# Patient Record
Sex: Female | Born: 1965 | Race: White | Hispanic: No | Smoking: Current every day smoker
Health system: Southern US, Community
[De-identification: ages and names within clinical notes are randomized; demographics above are authoritative.]

## PROBLEM LIST (undated history)

## (undated) DIAGNOSIS — K635 Polyp of colon: Secondary | ICD-10-CM

## (undated) DIAGNOSIS — A419 Sepsis, unspecified organism: Secondary | ICD-10-CM

## (undated) DIAGNOSIS — J1282 Pneumonia due to coronavirus disease 2019: Secondary | ICD-10-CM

## (undated) DIAGNOSIS — F32A Depression, unspecified: Secondary | ICD-10-CM

## (undated) DIAGNOSIS — M51369 Other intervertebral disc degeneration, lumbar region without mention of lumbar back pain or lower extremity pain: Secondary | ICD-10-CM

## (undated) DIAGNOSIS — R079 Chest pain, unspecified: Secondary | ICD-10-CM

## (undated) DIAGNOSIS — E785 Hyperlipidemia, unspecified: Secondary | ICD-10-CM

## (undated) DIAGNOSIS — R600 Localized edema: Secondary | ICD-10-CM

## (undated) DIAGNOSIS — J9601 Acute respiratory failure with hypoxia: Secondary | ICD-10-CM

## (undated) DIAGNOSIS — F419 Anxiety disorder, unspecified: Secondary | ICD-10-CM

## (undated) DIAGNOSIS — M4807 Spinal stenosis, lumbosacral region: Secondary | ICD-10-CM

## (undated) DIAGNOSIS — J449 Chronic obstructive pulmonary disease, unspecified: Secondary | ICD-10-CM

## (undated) DIAGNOSIS — E119 Type 2 diabetes mellitus without complications: Secondary | ICD-10-CM

## (undated) DIAGNOSIS — I5030 Unspecified diastolic (congestive) heart failure: Secondary | ICD-10-CM

## (undated) DIAGNOSIS — O039 Complete or unspecified spontaneous abortion without complication: Secondary | ICD-10-CM

## (undated) DIAGNOSIS — U071 COVID-19: Secondary | ICD-10-CM

## (undated) DIAGNOSIS — E559 Vitamin D deficiency, unspecified: Secondary | ICD-10-CM

## (undated) DIAGNOSIS — J45909 Unspecified asthma, uncomplicated: Secondary | ICD-10-CM

## (undated) DIAGNOSIS — M1990 Unspecified osteoarthritis, unspecified site: Secondary | ICD-10-CM

## (undated) DIAGNOSIS — I10 Essential (primary) hypertension: Secondary | ICD-10-CM

## (undated) DIAGNOSIS — K259 Gastric ulcer, unspecified as acute or chronic, without hemorrhage or perforation: Secondary | ICD-10-CM

## (undated) DIAGNOSIS — E538 Deficiency of other specified B group vitamins: Secondary | ICD-10-CM

## (undated) DIAGNOSIS — I509 Heart failure, unspecified: Secondary | ICD-10-CM

## (undated) DIAGNOSIS — K760 Fatty (change of) liver, not elsewhere classified: Secondary | ICD-10-CM

## (undated) HISTORY — DX: Complete or unspecified spontaneous abortion without complication: O03.9

## (undated) HISTORY — DX: Type 2 diabetes mellitus without complications: E11.9

## (undated) HISTORY — PX: TUBAL LIGATION: SHX77

---

## 2005-06-04 ENCOUNTER — Emergency Department: Payer: Self-pay | Admitting: Emergency Medicine

## 2005-10-24 ENCOUNTER — Emergency Department: Payer: Self-pay | Admitting: Emergency Medicine

## 2007-02-28 ENCOUNTER — Emergency Department: Payer: Self-pay | Admitting: Emergency Medicine

## 2007-03-25 ENCOUNTER — Emergency Department: Payer: Self-pay | Admitting: Emergency Medicine

## 2008-04-27 ENCOUNTER — Emergency Department: Payer: Self-pay | Admitting: Emergency Medicine

## 2008-07-14 ENCOUNTER — Emergency Department: Payer: Self-pay | Admitting: Internal Medicine

## 2008-11-03 ENCOUNTER — Emergency Department: Payer: Self-pay | Admitting: Emergency Medicine

## 2009-11-15 ENCOUNTER — Emergency Department: Payer: Self-pay | Admitting: Internal Medicine

## 2009-12-01 ENCOUNTER — Emergency Department: Payer: Self-pay | Admitting: Emergency Medicine

## 2010-05-09 ENCOUNTER — Emergency Department: Payer: Self-pay | Admitting: Emergency Medicine

## 2012-10-13 ENCOUNTER — Emergency Department: Payer: Self-pay | Admitting: Emergency Medicine

## 2015-08-24 ENCOUNTER — Emergency Department
Admission: EM | Admit: 2015-08-24 | Discharge: 2015-08-24 | Disposition: A | Discharge status: Home / Self Care | Payer: Self-pay | Attending: Emergency Medicine | Admitting: Emergency Medicine

## 2015-08-24 ENCOUNTER — Emergency Department: Payer: Self-pay

## 2015-08-24 DIAGNOSIS — W1839XA Other fall on same level, initial encounter: Secondary | ICD-10-CM | POA: Insufficient documentation

## 2015-08-24 DIAGNOSIS — S62617A Displaced fracture of proximal phalanx of left little finger, initial encounter for closed fracture: Secondary | ICD-10-CM | POA: Insufficient documentation

## 2015-08-24 DIAGNOSIS — Y9389 Activity, other specified: Secondary | ICD-10-CM | POA: Insufficient documentation

## 2015-08-24 DIAGNOSIS — S60052A Contusion of left little finger without damage to nail, initial encounter: Secondary | ICD-10-CM | POA: Insufficient documentation

## 2015-08-24 DIAGNOSIS — Y998 Other external cause status: Secondary | ICD-10-CM | POA: Insufficient documentation

## 2015-08-24 DIAGNOSIS — Y9289 Other specified places as the place of occurrence of the external cause: Secondary | ICD-10-CM | POA: Insufficient documentation

## 2015-08-24 DIAGNOSIS — S62607A Fracture of unspecified phalanx of left little finger, initial encounter for closed fracture: Secondary | ICD-10-CM

## 2015-08-24 MED ORDER — HYDROCODONE-ACETAMINOPHEN 5-325 MG PO TABS
1.0000 | ORAL_TABLET | Freq: Once | ORAL | Status: AC
  Administered 2015-08-24: 1 via ORAL
  Filled 2015-08-24: qty 1

## 2015-08-24 MED ORDER — HYDROCODONE-ACETAMINOPHEN 5-325 MG PO TABS: 1.0000 | ORAL_TABLET | ORAL | Status: DC | PRN

## 2015-08-24 NOTE — ED Provider Notes (Signed)
[facility] Emergency Department Provider Note  ____________________________________________  Time seen: Approximately 8:42 AM  I have reviewed the triage vital signs and the nursing notes.   HISTORY  Chief Complaint Finger Injury   HPI Jasmine Larsen is a 49 y.o. female is here with complaint of left fifth finger hurting after falling today. Patient denies any previous injury to her left hand. Patient did not take any medication prior to arrival. She denies any head injury or loss consciousness during this incident. Currently she reports her pain is 10 out of 10. Pain is increased with range of motion. Nothing is helping her pain.   No past medical history on file.  There are no active problems to display for this patient.   No past surgical history on file.  Current Outpatient Rx  Name  Route  Sig  Dispense  Refill  . HYDROcodone-acetaminophen (NORCO/VICODIN) 5-325 MG tablet   Oral   Take 1 tablet by mouth every 4 (four) hours as needed for moderate pain.   20 tablet   0     Allergies Review of patient's allergies indicates no known allergies.  No family history on file.  Social History Social History  Substance Use Topics  . Smoking status: Not on file  . Smokeless tobacco: Not on file  . Alcohol Use: Not on file    Review of Systems Constitutional: No fever/chills Eyes: No visual changes. ENT: No sore throat. Cardiovascular: Denies chest pain. Respiratory: Denies shortness of breath. Gastrointestinal:  No nausea, no vomiting.  Musculoskeletal: Negative for back pain. Positive left fifth finger pain. Neurological: Negative for headaches, focal weakness or numbness.  10-point ROS otherwise negative.  ____________________________________________   PHYSICAL EXAM:  VITAL SIGNS: ED Triage Vitals  Enc Vitals Group     BP 08/24/15 0838 144/79 mmHg     Pulse Rate 08/24/15 0838 98     Resp 08/24/15 0838 16     Temp 08/24/15  0838 98 F (36.7 C)     Temp Source 08/24/15 0838 Oral     SpO2 08/24/15 0838 97 %     Weight 08/24/15 0838 272 lb (123.378 kg)     Height 08/24/15 0838 5\' 4"  (1.626 m)     Head Cir --      Peak Flow --      Pain Score 08/24/15 0835 10     Pain Loc --      Pain Edu? --      Excl. in GC? --     Constitutional: Alert and oriented. Well appearing and in no acute distress. Eyes: Conjunctivae are normal. PERRL. EOMI. Head: Atraumatic. Nose: No congestion/rhinnorhea. Neck: No stridor.   Cardiovascular: Normal rate, regular rhythm. Grossly normal heart sounds.  Good peripheral circulation. Respiratory: Normal respiratory effort.  No retractions. Lungs CTAB. Gastrointestinal: Soft and nontender. No distention. No abdominal bruits. No CVA tenderness. Musculoskeletal: Left hand fifth finger moderate edema and tenderness at the base of the fifth digit. Range of motion is restricted secondary to pain. No lower extremity tenderness nor edema.  No joint effusions. Neurologic:  Normal speech and language. No gross focal neurologic deficits are appreciated. No gait instability. Motor sensory function intact. Skin:  Skin is warm, dry and intact. No rash noted. No abrasion. There is some ecchymosis noted on the volar aspect of the left hand at the base of the fifth finger. Psychiatric: Mood and affect are normal. Speech and behavior are normal.  ____________________________________________   LABS (  all labs ordered are listed, but only abnormal results are displayed)  Labs Reviewed - No data to display  RADIOLOGY  Left hand there is a fracture of the proximal aspect of the fifth phalanx with impaction and dorsal medial angulation distally per radiologist. ____________________________________________   PROCEDURES  Procedure(s) performed: None  Critical Care performed: No  ____________________________________________   INITIAL IMPRESSION / ASSESSMENT AND PLAN / ED COURSE  Pertinent labs  & imaging results that were available during my care of the patient were reviewed by me and considered in my medical decision making (see chart for details).  Patient was given Norco while in the emergency room. She is also given a prescription for the same. She is placed in a metal finger splint and also buddy taped to her fourth digit. She is to follow-up with Dr. Rosita Kea. She is also given a note for work for limited use of her left hand. ____________________________________________   FINAL CLINICAL IMPRESSION(S) / ED DIAGNOSES  Final diagnoses:  Closed fracture of phalanx of left little finger, initial encounter      Tommi Rumps, PA-C 08/24/15 1337  Emily Filbert, MD 08/24/15 [phone]

## 2015-08-24 NOTE — Discharge Instructions (Signed)
Finger Fracture Finger fractures are breaks in the bones of the fingers. There are many types of fractures. There are also different ways of treating these fractures. Your doctor will talk with you about the best way to treat your fracture. Injury is the main cause of broken fingers. This includes:  Injuries while playing sports.  Workplace injuries.  Falls. HOME CARE  Follow your doctor's instructions for:  Activities.  Exercises.  Physical therapy.  Take medicines only as told by your doctor for pain, discomfort, or fever. GET HELP IF: You have pain or swelling that limits:  The motion of your fingers.  The use of your fingers. GET HELP RIGHT AWAY IF:  You cannot feel your fingers, or your fingers become numb.   This information is not intended to replace advice given to you by your health care provider. Make sure you discuss any questions you have with your health care provider.   Document Released: 03/17/2008 Document Revised: 10/20/2014 Document Reviewed: 05/11/2013 Elsevier Interactive Patient Education 2016 [employer]    Ice and elevate as needed for swelling and pain. Wear splint for protection and support. Take Norco as needed for pain. Call orthopedist for appointment time.

## 2015-08-24 NOTE — ED Notes (Signed)
Pt injured left hand 5th finger this am.

## 2015-09-10 ENCOUNTER — Encounter: Payer: Self-pay | Admitting: Emergency Medicine

## 2015-09-10 ENCOUNTER — Emergency Department
Admission: EM | Admit: 2015-09-10 | Discharge: 2015-09-10 | Disposition: A | Discharge status: Home / Self Care | Payer: Self-pay | Attending: Emergency Medicine | Admitting: Emergency Medicine

## 2015-09-10 DIAGNOSIS — S62607A Fracture of unspecified phalanx of left little finger, initial encounter for closed fracture: Secondary | ICD-10-CM | POA: Insufficient documentation

## 2015-09-10 DIAGNOSIS — F17200 Nicotine dependence, unspecified, uncomplicated: Secondary | ICD-10-CM | POA: Insufficient documentation

## 2015-09-10 DIAGNOSIS — Y9289 Other specified places as the place of occurrence of the external cause: Secondary | ICD-10-CM | POA: Insufficient documentation

## 2015-09-10 DIAGNOSIS — W1839XA Other fall on same level, initial encounter: Secondary | ICD-10-CM | POA: Insufficient documentation

## 2015-09-10 DIAGNOSIS — Y998 Other external cause status: Secondary | ICD-10-CM | POA: Insufficient documentation

## 2015-09-10 DIAGNOSIS — S62609A Fracture of unspecified phalanx of unspecified finger, initial encounter for closed fracture: Secondary | ICD-10-CM

## 2015-09-10 DIAGNOSIS — Y9389 Activity, other specified: Secondary | ICD-10-CM | POA: Insufficient documentation

## 2015-09-10 HISTORY — DX: Unspecified asthma, uncomplicated: J45.909

## 2015-09-10 HISTORY — DX: Chronic obstructive pulmonary disease, unspecified: J44.9

## 2015-09-10 MED ORDER — OXYCODONE-ACETAMINOPHEN 5-325 MG PO TABS: 1.0000 | ORAL_TABLET | Freq: Four times a day (QID) | ORAL | Status: DC | PRN

## 2015-09-10 MED ORDER — MELOXICAM 15 MG PO TABS: 15.0000 mg | ORAL_TABLET | Freq: Every day | ORAL | Status: DC

## 2015-09-10 NOTE — Discharge Instructions (Signed)
Finger Fracture Fractures of fingers are breaks in the bones of the fingers. There are many types of fractures. There are different ways of treating these fractures. Your health care provider will discuss the best way to treat your fracture. CAUSES Traumatic injury is the main cause of broken fingers. These include:  Injuries while playing sports.  Workplace injuries.  Falls. RISK FACTORS Activities that can increase your risk of finger fractures include:  Sports.  Workplace activities that involve machinery.  A condition called osteoporosis, which can make your bones less dense and cause them to fracture more easily. SIGNS AND SYMPTOMS The main symptoms of a broken finger are pain and swelling within 15 minutes after the injury. Other symptoms include:  Bruising of your finger.  Stiffness of your finger.  Numbness of your finger.  Exposed bones (compound fracture) if the fracture is severe. DIAGNOSIS  The best way to diagnose a broken bone is with X-ray imaging. Additionally, your health care provider will use this X-ray image to evaluate the position of the broken finger bones.  TREATMENT  Finger fractures can be treated with:   Nonreduction--This means the bones are in place. The finger is splinted without changing the positions of the bone pieces. The splint is usually left on for about a week to 10 days. This will depend on your fracture and what your health care provider thinks.  Closed reduction--The bones are put back into position without using surgery. The finger is then splinted.  Open reduction and internal fixation--The fracture site is opened. Then the bone pieces are fixed into place with pins or some type of hardware. This is seldom required. It depends on the severity of the fracture. HOME CARE INSTRUCTIONS   Follow your health care provider's instructions regarding activities, exercises, and physical therapy.  Only take over-the-counter or prescription  medicines for pain, discomfort, or fever as directed by your health care provider. SEEK MEDICAL CARE IF: You have pain or swelling that limits the motion or use of your fingers. SEEK IMMEDIATE MEDICAL CARE IF:  Your finger becomes numb. MAKE SURE YOU:   Understand these instructions.  Will watch your condition.  Will get help right away if you are not doing well or get worse.   This information is not intended to replace advice given to you by your health care provider. Make sure you discuss any questions you have with your health care provider.   Document Released: 01/11/2001 Document Revised: 07/20/2013 Document Reviewed: 05/11/2013 Elsevier Interactive Patient Education 2016 [place] or Splint Care Casts and splints support injured limbs and keep bones from moving while they heal.  HOME CARE  Keep the cast or splint uncovered during the drying period.  A plaster cast can take 24 to 48 hours to dry.  A fiberglass cast will dry in less than 1 hour.  Do not rest the cast on anything harder than a pillow for 24 hours.  Do not put weight on your injured limb. Do not put pressure on the cast. Wait for your doctor's approval.  Keep the cast or splint dry.  Cover the cast or splint with a plastic bag during baths or wet weather.  If you have a cast over your chest and belly (trunk), take sponge baths until the cast is taken off.  If your cast gets wet, dry it with a towel or blow dryer. Use the cool setting on the blow dryer.  Keep your cast or splint clean. Wash a  dirty cast with a damp cloth. °· Do not put any objects under your cast or splint. °· Do not scratch the skin under the cast with an object. If itching is a problem, use a blow dryer on a cool setting over the itchy area. °· Do not trim or cut your cast. °· Do not take out the padding from inside your cast. °· Exercise your joints near the cast as told by your doctor. °· Raise (elevate) your injured limb on 1  or 2 pillows for the first 1 to 3 days. °GET HELP IF: °· Your cast or splint cracks. °· Your cast or splint is too tight or too loose. °· You itch badly under the cast. °· Your cast gets wet or has a soft spot. °· You have a bad smell coming from the cast. °· You get an object stuck under the cast. °· Your skin around the cast becomes red or sore. °· You have new or more pain after the cast is put on. °GET HELP RIGHT AWAY IF: °· You have fluid leaking through the cast. °· You cannot move your fingers or toes. °· Your fingers or toes turn blue or white or are cool, painful, or puffy (swollen). °· You have tingling or lose feeling (numbness) around the injured area. °· You have bad pain or pressure under the cast. °· You have trouble breathing or have shortness of breath. °· You have chest pain. °  °This information is not intended to replace advice given to you by your health care provider. Make sure you discuss any questions you have with your health care provider. °  °Document Released: 01/29/2011 Document Revised: 06/01/2013 Document Reviewed: 04/07/2013 °Elsevier Interactive Patient Education ©2016 [employer] Inc. ° °

## 2015-09-10 NOTE — ED Notes (Signed)
AAOx3.  Skin warm and dry.  NAD 

## 2015-09-10 NOTE — ED Notes (Signed)
States she fell and has a fx to left hand / finger states she is having increased pain

## 2015-09-10 NOTE — ED Provider Notes (Signed)
[facility] Emergency Department Provider Note  ____________________________________________  Time seen: Approximately 5:05 PM  I have reviewed the triage vital signs and the nursing notes.   HISTORY  Chief Complaint Hand Pain  HPI Jasmine Larsen is a 49 y.o. female who presents to the emergency department for evaluation of left fifth finger pain. She states that on the 11th she fell on outstretched hand that resulted in a fracture. She was evaluated here and placed in a splint. She then followed up with clinic orthopedics.She reports that the "PA snapped it and then put it in a cheap aluminum splint that is cutting everything." She reports that the pain is worse. She denies new injury.  Past Medical History  Diagnosis Date  . Asthma   . COPD (chronic obstructive pulmonary disease) (HCC)     There are no active problems to display for this patient.   History reviewed. No pertinent past surgical history.  Current Outpatient Rx  Name  Route  Sig  Dispense  Refill  . HYDROcodone-acetaminophen (NORCO/VICODIN) 5-325 MG tablet   Oral   Take 1 tablet by mouth every 4 (four) hours as needed for moderate pain.   20 tablet   0   . meloxicam (MOBIC) 15 MG tablet   Oral   Take 1 tablet (15 mg total) by mouth daily.   30 tablet   2   . oxyCODONE-acetaminophen (ROXICET) 5-325 MG tablet   Oral   Take 1 tablet by mouth every 6 (six) hours as needed.   12 tablet   0     Allergies Review of patient's allergies indicates no known allergies.  No family history on file.  Social History Social History  Substance Use Topics  . Smoking status: Current Every Day Smoker  . Smokeless tobacco: None  . Alcohol Use: No    Review of Systems Constitutional: No recent illness. Eyes: No visual changes. ENT: No sore throat. Cardiovascular: Denies chest pain or palpitations. Respiratory: Denies shortness of breath. Gastrointestinal: No abdominal pain.   Genitourinary: Negative for dysuria. Musculoskeletal: Pain in left 5th digit. Skin: Negative for rash. Neurological: Negative for headaches, focal weakness or numbness. 10-point ROS otherwise negative.  ____________________________________________   PHYSICAL EXAM:  VITAL SIGNS: ED Triage Vitals  Enc Vitals Group     BP 09/10/15 1628 132/84 mmHg     Pulse Rate 09/10/15 1628 95     Resp 09/10/15 1628 20     Temp 09/10/15 1628 98.6 F (37 C)     Temp Source 09/10/15 1628 Oral     SpO2 09/10/15 1628 94 %     Weight --      Height 09/10/15 1628  (1.575 m)     Head Cir --      Peak Flow --      Pain Score 09/10/15 1625 10     Pain Loc --      Pain Edu? --      Excl. in GC? --     Constitutional: Alert and oriented. Well appearing and in no acute distress. Eyes: Conjunctivae are normal. EOMI. Head: Atraumatic. Nose: No congestion/rhinnorhea. Neck: No stridor.  Respiratory: Normal respiratory effort.   Musculoskeletal: Deformity noted to the MCP of the 5th digit of the left hand. Limited range of motion due to pain.  Neurologic:  Normal speech and language. No gross focal neurologic deficits are appreciated. Speech is normal. No gait instability. Skin:  Skin is warm, dry and intact. Atraumatic.  Psychiatric: Mood and affect are normal. Speech and behavior are normal.  ____________________________________________   LABS (all labs ordered are listed, but only abnormal results are displayed)  Labs Reviewed - No data to display ____________________________________________  RADIOLOGY  Not indicated. ____________________________________________   PROCEDURES  Procedure(s) performed:   SPLINT APPLICATION Date/Time: 5:15 PM Authorized by: Kem Boroughsari Nathanyal Ashmead Consent: Verbal consent obtained. Risks and benefits: risks, benefits and alternatives were discussed Consent given by: patient Splint applied by: Mardene CelesteJoAnna, emergency department tech Location details: 5th digit  left hand Splint type: Modified ulnar gutter splint to enclose 4th and 5th digits and extends to mid forearm. Supplies used: OCL and ACE Post-procedure: The splinted body part was neurovascularly unchanged following the procedure. Patient tolerance: Patient tolerated the procedure well with no immediate complications. Patient reports decrease in pain after splinting.      ____________________________________________   INITIAL IMPRESSION / ASSESSMENT AND PLAN / ED COURSE  Pertinent labs & imaging results that were available during my care of the patient were reviewed by me and considered in my medical decision making (see chart for details).  Patient was advised to follow up with orthopedics in 2 weeks for repeat films. She was advised to leave the splint in place until follow up. She was advised to return to the emergency department for symptoms that change or worsen if unable to schedule an appointment with orthopedics.  ____________________________________________   FINAL CLINICAL IMPRESSION(S) / ED DIAGNOSES  Final diagnoses:  Fracture of finger, left, closed, initial encounter       Chinita PesterCari B Ashtin Melichar, FNP 09/10/15 1721  Loleta Roseory Forbach, MD 09/10/15 1814

## 2016-01-06 ENCOUNTER — Encounter: Payer: Self-pay | Admitting: Medical Oncology

## 2016-01-06 ENCOUNTER — Emergency Department: Payer: Self-pay

## 2016-01-06 ENCOUNTER — Emergency Department
Admission: EM | Admit: 2016-01-06 | Discharge: 2016-01-06 | Disposition: A | Discharge status: Home / Self Care | Payer: Self-pay | Attending: Emergency Medicine | Admitting: Emergency Medicine

## 2016-01-06 DIAGNOSIS — R197 Diarrhea, unspecified: Secondary | ICD-10-CM | POA: Insufficient documentation

## 2016-01-06 DIAGNOSIS — Z791 Long term (current) use of non-steroidal anti-inflammatories (NSAID): Secondary | ICD-10-CM | POA: Insufficient documentation

## 2016-01-06 DIAGNOSIS — K2970 Gastritis, unspecified, without bleeding: Secondary | ICD-10-CM | POA: Insufficient documentation

## 2016-01-06 DIAGNOSIS — N390 Urinary tract infection, site not specified: Secondary | ICD-10-CM | POA: Insufficient documentation

## 2016-01-06 DIAGNOSIS — R1013 Epigastric pain: Secondary | ICD-10-CM

## 2016-01-06 DIAGNOSIS — F17200 Nicotine dependence, unspecified, uncomplicated: Secondary | ICD-10-CM | POA: Insufficient documentation

## 2016-01-06 HISTORY — DX: Gastric ulcer, unspecified as acute or chronic, without hemorrhage or perforation: K25.9

## 2016-01-06 LAB — COMPREHENSIVE METABOLIC PANEL
ALBUMIN: 3.9 g/dL (ref 3.5–5.0)
ALT: 21 U/L (ref 14–54)
ANION GAP: 6 (ref 5–15)
AST: 14 U/L — ABNORMAL LOW (ref 15–41)
Alkaline Phosphatase: 95 U/L (ref 38–126)
BILIRUBIN TOTAL: 0.4 mg/dL (ref 0.3–1.2)
BUN: 15 mg/dL (ref 6–20)
CO2: 27 mmol/L (ref 22–32)
Calcium: 8.7 mg/dL — ABNORMAL LOW (ref 8.9–10.3)
Chloride: 102 mmol/L (ref 101–111)
Creatinine, Ser: 0.54 mg/dL (ref 0.44–1.00)
GFR calc non Af Amer: >60 mL/min (ref >60)
GLUCOSE: 97 mg/dL (ref 65–99)
POTASSIUM: 4.2 mmol/L (ref 3.5–5.1)
SODIUM: 135 mmol/L (ref 135–145)
TOTAL PROTEIN: 7.3 g/dL (ref 6.5–8.1)

## 2016-01-06 LAB — CBC
HEMATOCRIT: 43.1 % (ref 35.0–47.0)
HEMOGLOBIN: 14.3 g/dL (ref 12.0–16.0)
MCH: 28.9 pg (ref 26.0–34.0)
MCHC: 33.3 g/dL (ref 32.0–36.0)
MCV: 86.9 fL (ref 80.0–100.0)
Platelets: 219 10*3/uL (ref 150–440)
RBC: 4.96 MIL/uL (ref 3.80–5.20)
RDW: 14.1 % (ref 11.5–14.5)
WBC: 9.2 10*3/uL (ref 3.6–11.0)

## 2016-01-06 LAB — URINALYSIS COMPLETE WITH MICROSCOPIC (ARMC ONLY)
Bilirubin Urine: NEGATIVE
Glucose, UA: NEGATIVE mg/dL
HGB URINE DIPSTICK: NEGATIVE
KETONES UR: NEGATIVE mg/dL
NITRITE: NEGATIVE
PH: 8 (ref 5.0–8.0)
PROTEIN: NEGATIVE mg/dL
SPECIFIC GRAVITY, URINE: 1.014 (ref 1.005–1.030)

## 2016-01-06 LAB — LIPASE, BLOOD: Lipase: 19 U/L (ref 11–51)

## 2016-01-06 IMAGING — US US ABDOMEN LIMITED
1 series · 14 of 25 positions shown · non-contrast
Comparison: None.

CLINICAL DATA: Epigastric pain for 5 days.

EXAM:
US ABDOMEN LIMITED - RIGHT UPPER QUADRANT

[Series 1: us abdomen limited · 0.28mm/px · 14 of 49 slices shown]
[im 1/49]
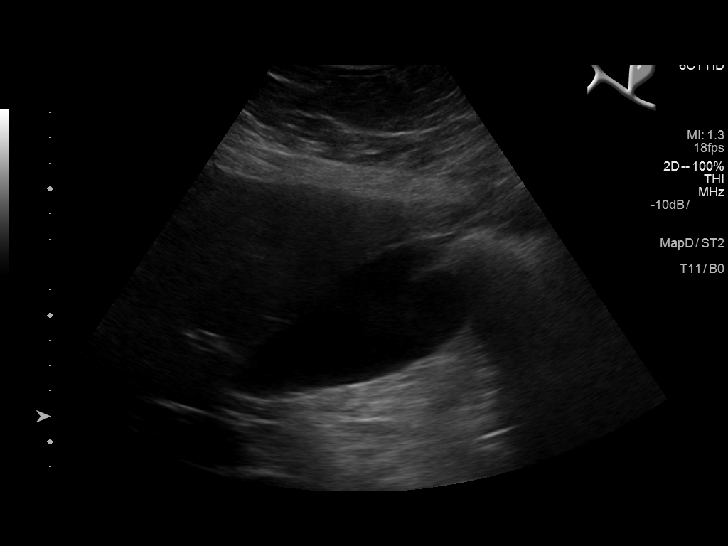
[im 5/49]
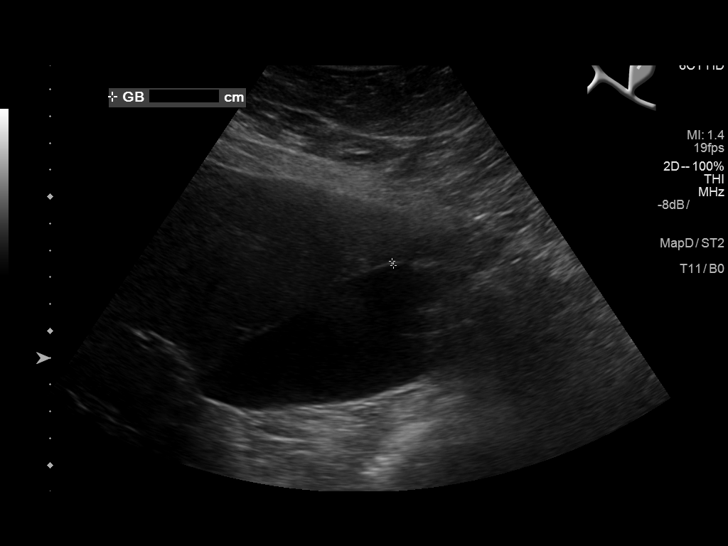
[im 9/49]
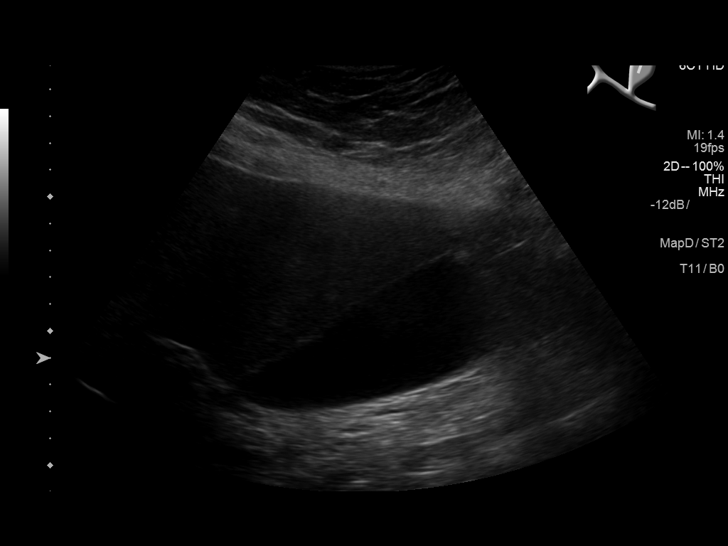
[im 13/49]
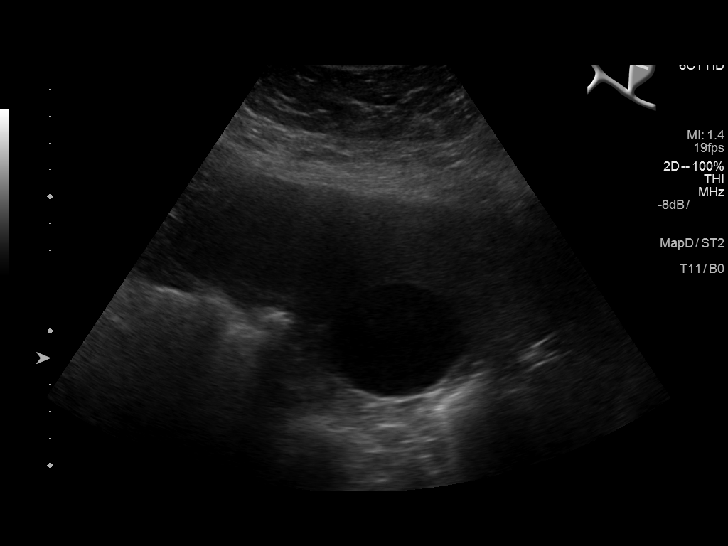
[im 17/49]
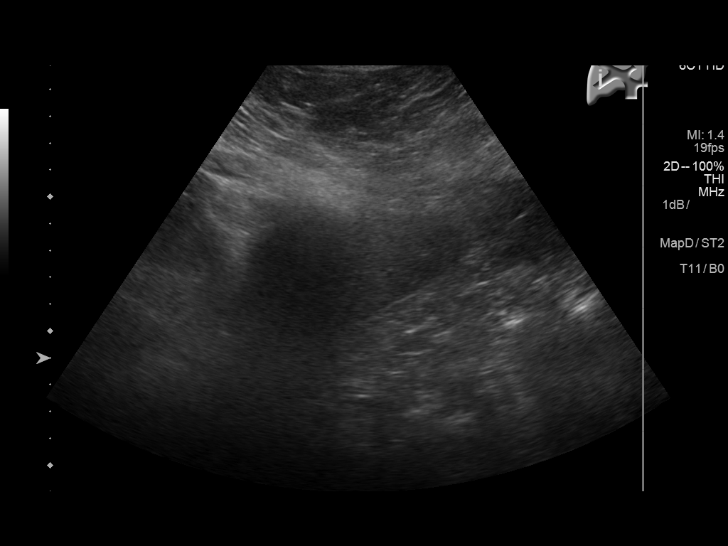
[im 19/49]
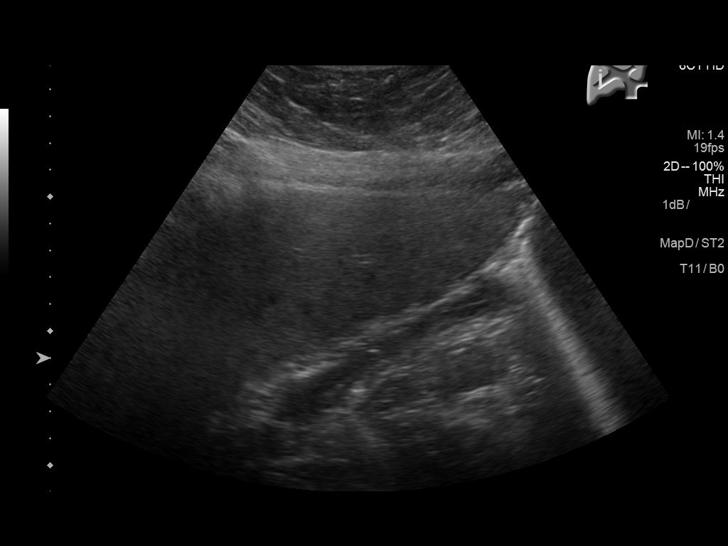
[im 23/49]
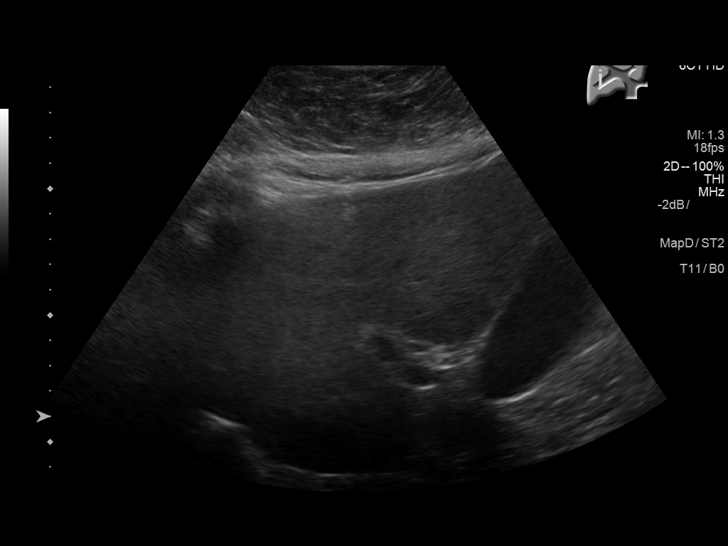
[im 27/49]
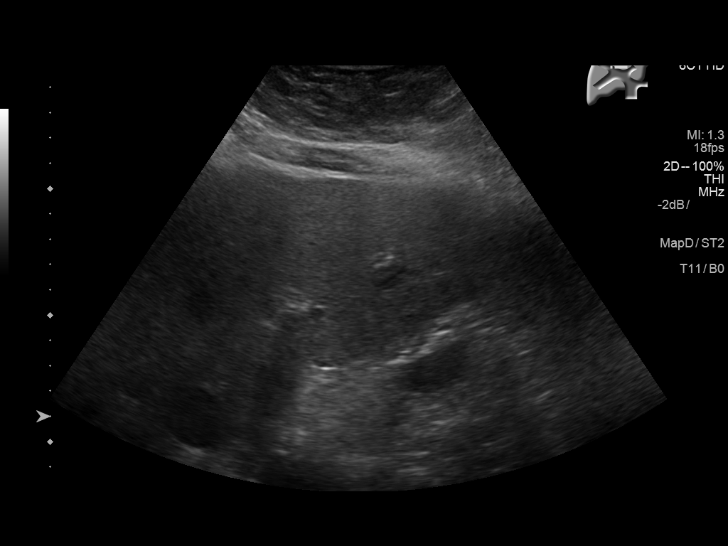
[im 31/49]
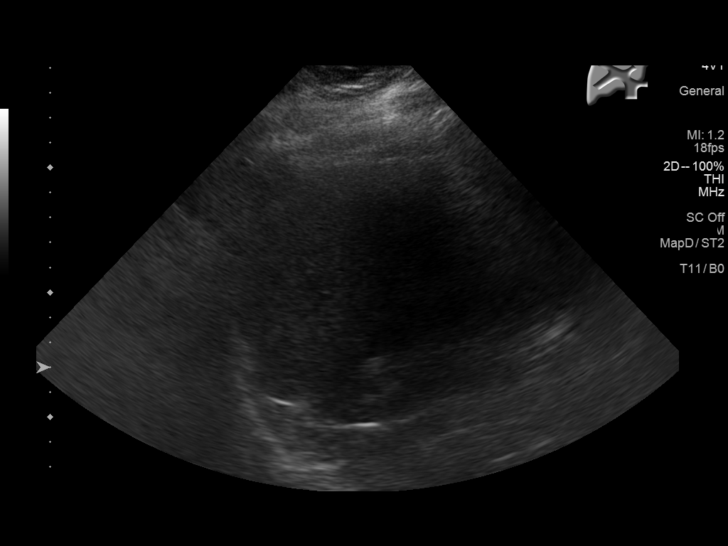
[im 33/49]
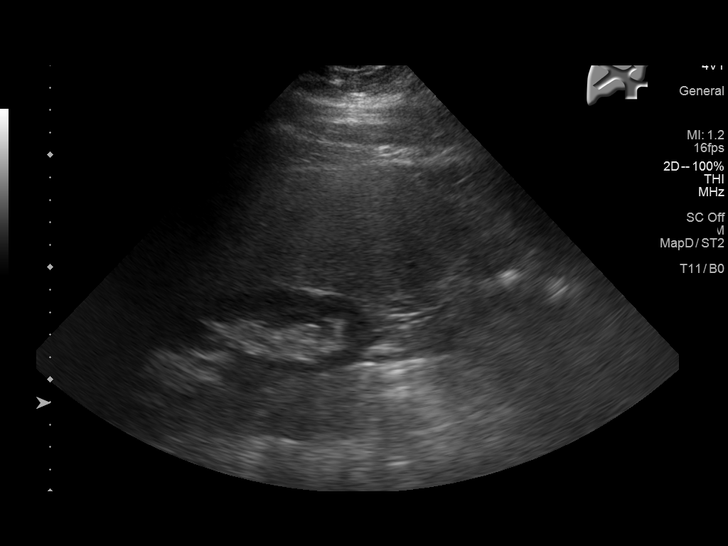
[im 37/49]
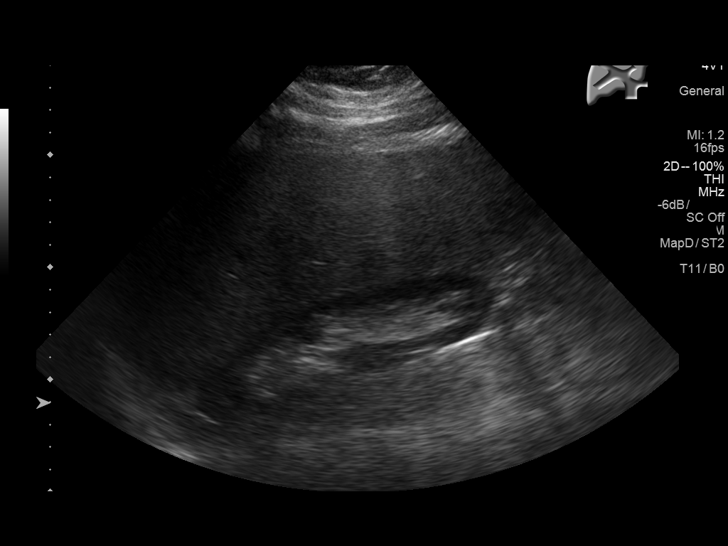
[im 41/49]
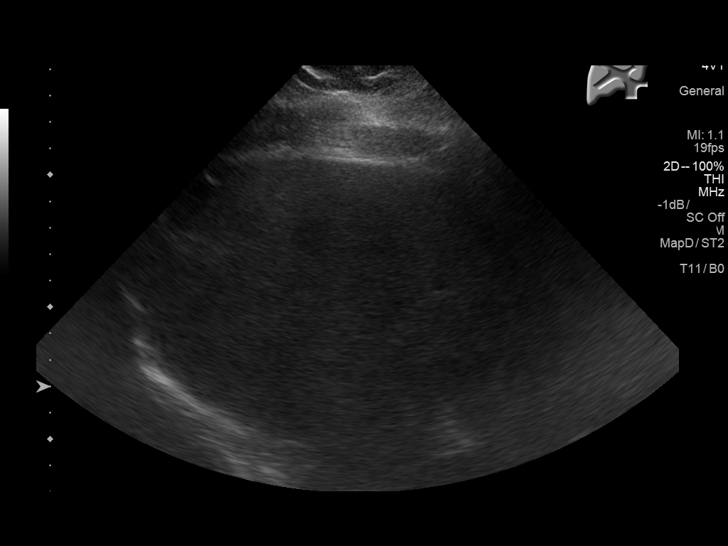
[im 45/49]
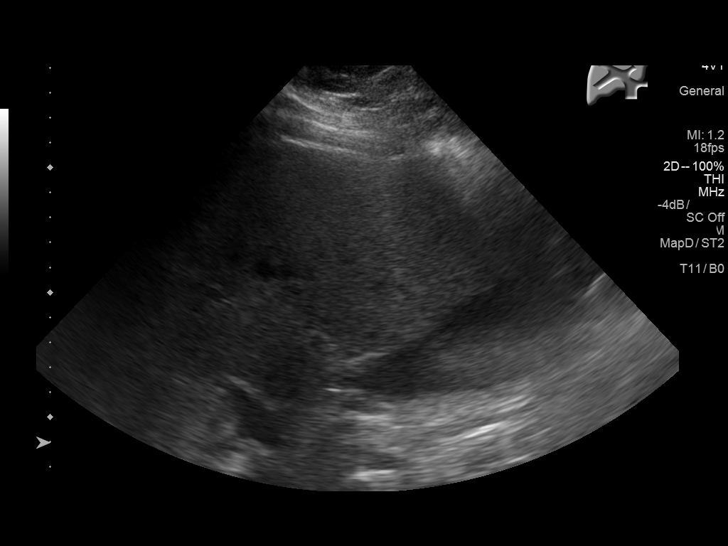
[im 49/49]
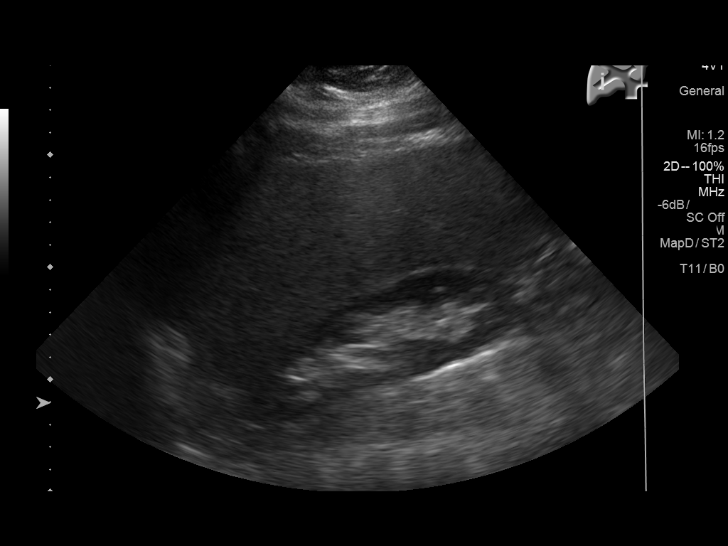

[14 of 25 positions shown; findings below may reference images not displayed]

FINDINGS: Gallbladder:

No gallstones or wall thickening visualized. No sonographic Murphy
sign noted by sonographer.

Common bile duct:

Diameter: 7.3 mm. Duct dilates to 13 mm more distally. No duct stone
is visualized.

Liver:

Echogenic with poor through transmission of the sound beam. No liver
mass or focal lesion.
IMPRESSION: 1. Normal gallbladder.
2. Dilated bile duct. Although this may be chronic, consider duct
obstruction if there are consistent clinical symptoms. This could be
further assessed with ERCP or MRCP.
3. Echogenic liver.  Findings suggests hepatic steatosis.

## 2016-01-06 MED ORDER — CIPROFLOXACIN HCL 500 MG PO TABS: 500.0000 mg | ORAL_TABLET | Freq: Two times a day (BID) | ORAL | Status: AC

## 2016-01-06 MED ORDER — ONDANSETRON HCL 4 MG PO TABS
4.0000 mg | ORAL_TABLET | Freq: Once | ORAL | Status: AC
  Administered 2016-01-06: 4 mg via ORAL
  Filled 2016-01-06: qty 1

## 2016-01-06 MED ORDER — TRAMADOL HCL 50 MG PO TABS: 50.0000 mg | ORAL_TABLET | Freq: Four times a day (QID) | ORAL | Status: DC | PRN

## 2016-01-06 MED ORDER — GI COCKTAIL ~~LOC~~
30.0000 mL | Freq: Once | ORAL | Status: AC
  Administered 2016-01-06: 30 mL via ORAL
  Filled 2016-01-06: qty 30

## 2016-01-06 MED ORDER — PANTOPRAZOLE SODIUM 20 MG PO TBEC: 20.0000 mg | DELAYED_RELEASE_TABLET | Freq: Every day | ORAL | Status: DC

## 2016-01-06 MED ORDER — ONDANSETRON HCL 4 MG PO TABS: 4.0000 mg | ORAL_TABLET | Freq: Three times a day (TID) | ORAL | Status: DC | PRN

## 2016-01-06 MED ORDER — PANTOPRAZOLE SODIUM 40 MG PO TBEC
40.0000 mg | DELAYED_RELEASE_TABLET | Freq: Once | ORAL | Status: AC
  Administered 2016-01-06: 40 mg via ORAL
  Filled 2016-01-06: qty 1

## 2016-01-06 NOTE — ED Notes (Signed)
Lower abd pain with diarrhea and vomiting that began Thursday. Pt reports her emesis is coffee ground.

## 2016-01-06 NOTE — ED Notes (Signed)
MD at bedside at this time.

## 2016-01-06 NOTE — ED Provider Notes (Signed)
Time Seen: Approximately 1700  I have reviewed the triage notes  Chief Complaint: Abdominal Pain; Emesis; and Diarrhea   History of Present Illness: Jasmine Larsen is a 50 y.o. female who presents with to this historian. Umbilical abdominal pain with nausea vomiting and diarrhea that started on Thursday. Patient states the pain started first and she's been taking some over-the-counter Pepto-Bismol for her discomfort. She states she was concerned could she vomited dark emesis one time and also had some dark stool. She is not aware of any fever and states the nausea and vomiting and the diarrhea has now slowed but she still has some persistent abdominal pain. Denies any lateralization of the pain or radiation to the back or flank area. She denies any dysuria, hematuria or urinary frequencies but she is prone to urinary tract infections.   Past Medical History  Diagnosis Date  . Asthma   . COPD (chronic obstructive pulmonary disease) (HCC)   . Multiple gastric ulcers     There are no active problems to display for this patient.   History reviewed. No pertinent past surgical history.  History reviewed. No pertinent past surgical history.  Current Outpatient Rx  Name  Route  Sig  Dispense  Refill  . HYDROcodone-acetaminophen (NORCO/VICODIN) 5-325 MG tablet   Oral   Take 1 tablet by mouth every 4 (four) hours as needed for moderate pain.   20 tablet   0   . meloxicam (MOBIC) 15 MG tablet   Oral   Take 1 tablet (15 mg total) by mouth daily.   30 tablet   2   . oxyCODONE-acetaminophen (ROXICET) 5-325 MG tablet   Oral   Take 1 tablet by mouth every 6 (six) hours as needed.   12 tablet   0     Allergies:  Review of patient's allergies indicates no known allergies.  Family History: No family history on file.  Social History: Social History  Substance Use Topics  . Smoking status: Current Every Day Smoker  . Smokeless tobacco: None  . Alcohol Use: No     Review  of Systems:   10 point review of systems was performed and was otherwise negative:  Constitutional: No fever Eyes: No visual disturbances ENT: No sore throat, ear pain Cardiac: No chest pain Respiratory: No shortness of breath, wheezing, or stridor Abdomen: Patient points mainly to the umbilicus region. Endocrine: No weight loss, No night sweats Extremities: No peripheral edema, cyanosis Skin: No rashes, easy bruising Neurologic: No focal weakness, trouble with speech or swollowing Urologic: No dysuria, Hematuria, or urinary frequency   Physical Exam:  ED Triage Vitals  Enc Vitals Group     BP 01/06/16 1513 140/83 mmHg     Pulse Rate 01/06/16 1513 91     Resp 01/06/16 1513 18     Temp 01/06/16 1513 98.2 F (36.8 C)     Temp Source 01/06/16 1513 Oral     SpO2 01/06/16 1513 96 %     Weight 01/06/16 1513 282 lb (127.914 kg)     Height 01/06/16 1513 5\' 3"  (1.6 m)     Head Cir --      Peak Flow --      Pain Score 01/06/16 1515 10     Pain Loc --      Pain Edu? --      Excl. in GC? --     General: Awake , Alert , and Oriented times 3; GCS 15 Head: Normal cephalic ,  atraumatic Eyes: Pupils equal , round, reactive to light Nose/Throat: No nasal drainage, patent upper airway without erythema or exudate.  Neck: Supple, Full range of motion, No anterior adenopathy or palpable thyroid masses Lungs: Clear to ascultation without wheezes , rhonchi, or rales Heart: Regular rate, regular rhythm without murmurs , gallops , or rubs Abdomen: Morbidly obese tender in the umbilical region without rebound, guarding , or rigidity; bowel sounds positive and symmetric in all 4 quadrants. No organomegaly .       Negative tenderness in McBurney's point, negative Murphy's sign Extremities: 2 plus symmetric pulses. No edema, clubbing or cyanosis Neurologic: normal ambulation, Motor symmetric without deficits, sensory intact Skin: warm, dry, no rashes Rectal exam with chaperone present showed dark  stool but was guaiac negative. Normal sphincter tone  Labs:   All laboratory work was reviewed including any pertinent negatives or positives listed below:  Labs Reviewed  COMPREHENSIVE METABOLIC PANEL - Abnormal; Notable for the following:    Calcium 8.7 (*)    AST 14 (*)    All other components within normal limits  URINALYSIS COMPLETEWITH MICROSCOPIC ([facility] ONLY) - Abnormal; Notable for the following:    Color, Urine YELLOW (*)    APPearance CLOUDY (*)    Leukocytes, UA 1+ (*)    Bacteria, UA MANY (*)    Squamous Epithelial / LPF 6-30 (*)    All other components within normal limits  LIPASE, BLOOD  CBC  Review of laboratory work shows findings consistent with urinary tract infection   Radiology: *   Narrative:    CLINICAL DATA: Epigastric pain for 5 days.  EXAM: US ABDOMEN LIMITED - RIGHT UPPER QUADRANT  COMPARISON: None.  FINDINGS: Gallbladder:  No gallstones or wall thickening visualized. No sonographic Murphy sign noted by sonographer.  Common bile duct:  Diameter: 7.3 mm. Duct dilates to 13 mm more distally. No duct stone is visualized.  Liver:  Echogenic with poor through transmission of the sound beam. No liver mass or focal lesion.  IMPRESSION: 1. Normal gallbladder. 2. Dilated bile duct. Although this may be chronic, consider duct obstruction if there are consistent clinical symptoms. This could be further assessed with ERCP or MRCP. 3. Echogenic liver. Findings suggests hepatic steatosis.   Electronically Signed      I personally reviewed the radiologic studies   ED Course:  Patient was given a GI cocktail, oral Zofran, and started on proton next I felt her differential reduced to urinary tract infection, renal colic, ovarian cyst, low probability of a appendicitis. Unlikely to be pelvic inflammatory disease or endometriosis. Pancreatitis, acute cholecystitis, gastritis The patient appears to have a urinary tract infection and was  started on antibiotic therapy. Patient appears not to have any surgical findings at this time. She states she has been on ciprofloxacin in the past and was prescribed Cipro  Tylenol. The dark appearance of her emesis along with her stool is likely secondary to the Pepto-Bismol. She was advised to stop Pepto-Bismol due to its content of aspirin which may actually be irritating her stomach.   Assessment: Acute urinary tract infection Acute gastritis   Final Clinical Impression:  Final diagnoses:  Epigastric pain     Plan: Outpatient management Patient was advised to return immediately if condition worsens. Patient was advised to follow up with their primary care physician or other specialized physicians involved in their outpatient care. The patient and/or family member/power of attorney had laboratory results reviewed at the bedside. All questions and concerns were  addressed and appropriate discharge instructions were distributed by the nursing staff. Prescriptions for Cipro, protonix            Jennye Moccasin, MD 01/06/16 1800

## 2016-01-06 NOTE — Discharge Instructions (Signed)
Abdominal Pain, Adult Many things can cause abdominal pain. Usually, abdominal pain is not caused by a disease and will improve without treatment. It can often be observed and treated at home. Your health care provider will do a physical exam and possibly order blood tests and X-rays to help determine the seriousness of your pain. However, in many cases, more time must pass before a clear cause of the pain can be found. Before that point, your health care provider may not know if you need more testing or further treatment. HOME CARE INSTRUCTIONS Monitor your abdominal pain for any changes. The following actions may help to alleviate any discomfort you are experiencing:  Only take over-the-counter or prescription medicines as directed by your health care provider.  Do not take laxatives unless directed to do so by your health care provider.  Try a clear liquid diet (broth, tea, or water) as directed by your health care provider. Slowly move to a bland diet as tolerated. SEEK MEDICAL CARE IF:  You have unexplained abdominal pain.  You have abdominal pain associated with nausea or diarrhea.  You have pain when you urinate or have a bowel movement.  You experience abdominal pain that wakes you in the night.  You have abdominal pain that is worsened or improved by eating food.  You have abdominal pain that is worsened with eating fatty foods.  You have a fever. SEEK IMMEDIATE MEDICAL CARE IF:  Your pain does not go away within 2 hours.  You keep throwing up (vomiting).  Your pain is felt only in portions of the abdomen, such as the right side or the left lower portion of the abdomen.  You pass bloody or black tarry stools. MAKE SURE YOU:  Understand these instructions.  Will watch your condition.  Will get help right away if you are not doing well or get worse.   This information is not intended to replace advice given to you by your health care provider. Make sure you discuss  any questions you have with your health care provider.   Document Released: 07/09/2005 Document Revised: 06/20/2015 Document Reviewed: 06/08/2013 Elsevier Interactive Patient Education [employer]2016 [employer]  Please return immediately if condition worsens. Please contact her primary physician or the physician you were given for referral. If you have any specialist physicians involved in her treatment and plan please also contact them. Thank you for using [facility]. Please avoid Pepto-Bismol and ibuprofen and related medications as they may irritate her stomach. He may take Maalox and/or Mylanta. Drink plenty of fluids and return here especially if he develops a fever. Please take Tylenol over-the-counter for pain.

## 2016-01-06 NOTE — ED Notes (Signed)
NAD noted at time of D/C. Pt denies questions or concerns. Pt ambulatory to the lobby at this time.  

## 2016-07-07 ENCOUNTER — Emergency Department
Admission: EM | Admit: 2016-07-07 | Discharge: 2016-07-07 | Disposition: A | Discharge status: Home / Self Care | Payer: Self-pay | Attending: Emergency Medicine | Admitting: Emergency Medicine

## 2016-07-07 ENCOUNTER — Encounter: Payer: Self-pay | Admitting: Emergency Medicine

## 2016-07-07 ENCOUNTER — Emergency Department: Payer: Self-pay

## 2016-07-07 DIAGNOSIS — J811 Chronic pulmonary edema: Secondary | ICD-10-CM | POA: Insufficient documentation

## 2016-07-07 DIAGNOSIS — I509 Heart failure, unspecified: Secondary | ICD-10-CM | POA: Insufficient documentation

## 2016-07-07 DIAGNOSIS — Z791 Long term (current) use of non-steroidal anti-inflammatories (NSAID): Secondary | ICD-10-CM | POA: Insufficient documentation

## 2016-07-07 DIAGNOSIS — J449 Chronic obstructive pulmonary disease, unspecified: Secondary | ICD-10-CM | POA: Insufficient documentation

## 2016-07-07 DIAGNOSIS — J45909 Unspecified asthma, uncomplicated: Secondary | ICD-10-CM | POA: Insufficient documentation

## 2016-07-07 DIAGNOSIS — I110 Hypertensive heart disease with heart failure: Secondary | ICD-10-CM | POA: Insufficient documentation

## 2016-07-07 DIAGNOSIS — F17200 Nicotine dependence, unspecified, uncomplicated: Secondary | ICD-10-CM | POA: Insufficient documentation

## 2016-07-07 LAB — CBC
HCT: 40.6 % (ref 35.0–47.0)
HEMOGLOBIN: 14.1 g/dL (ref 12.0–16.0)
MCH: 30.3 pg (ref 26.0–34.0)
MCHC: 34.7 g/dL (ref 32.0–36.0)
MCV: 87.2 fL (ref 80.0–100.0)
Platelets: 192 10*3/uL (ref 150–440)
RBC: 4.65 MIL/uL (ref 3.80–5.20)
RDW: 14.6 % — ABNORMAL HIGH (ref 11.5–14.5)
WBC: 7.7 10*3/uL (ref 3.6–11.0)

## 2016-07-07 LAB — BASIC METABOLIC PANEL
ANION GAP: 5 (ref 5–15)
BUN: 12 mg/dL (ref 6–20)
CALCIUM: 8.3 mg/dL — AB (ref 8.9–10.3)
CO2: 28 mmol/L (ref 22–32)
Chloride: 105 mmol/L (ref 101–111)
Creatinine, Ser: 0.54 mg/dL (ref 0.44–1.00)
Glucose, Bld: 173 mg/dL — ABNORMAL HIGH (ref 65–99)
POTASSIUM: 3.8 mmol/L (ref 3.5–5.1)
Sodium: 138 mmol/L (ref 135–145)

## 2016-07-07 LAB — TROPONIN I

## 2016-07-07 MED ORDER — FUROSEMIDE 40 MG PO TABS: 40.0000 mg | ORAL_TABLET | Freq: Every day | ORAL | 0 refills | Status: DC

## 2016-07-07 NOTE — ED Provider Notes (Signed)
[facility] Emergency Department Provider Note   ____________________________________________    I have reviewed the triage vital signs and the nursing notes.   HISTORY  Chief Complaint Foot Swelling     HPI Jasmine Larsen is a 50 y.o. female who presents with complaints of lower extremity swelling bilaterally over the last several weeks. She reports baseline shortness of breath and denies worsening shortness of breath. She denies chest pain. She denies calf pain. She denies a history of blood clots. She denies fevers chills or cough. She reports she is always had to sleep in a recliner. She does smoke cigarettes. No recent travel   Past Medical History:  Diagnosis Date  . Asthma   . COPD (chronic obstructive pulmonary disease) (HCC)   . Multiple gastric ulcers     There are no active problems to display for this patient.   History reviewed. No pertinent surgical history.  Prior to Admission medications   Medication Sig Start Date End Date Taking? Authorizing Provider  HYDROcodone-acetaminophen (NORCO/VICODIN) 5-325 MG tablet Take 1 tablet by mouth every 4 (four) hours as needed for moderate pain. 08/24/15   Tommi Rumpshonda L Summers, PA-C  meloxicam (MOBIC) 15 MG tablet Take 1 tablet (15 mg total) by mouth daily. 09/10/15   Chinita Pesterari B Triplett, FNP  ondansetron (ZOFRAN) 4 MG tablet Take 1 tablet (4 mg total) by mouth every 8 (eight) hours as needed for nausea or vomiting. 01/06/16   Jennye MoccasinBrian S Quigley, MD  oxyCODONE-acetaminophen (ROXICET) 5-325 MG tablet Take 1 tablet by mouth every 6 (six) hours as needed. 09/10/15   Chinita Pesterari B Triplett, FNP  pantoprazole (PROTONIX) 20 MG tablet Take 1 tablet (20 mg total) by mouth daily. 01/06/16 01/05/17  Jennye MoccasinBrian S Quigley, MD  traMADol (ULTRAM) 50 MG tablet Take 1 tablet (50 mg total) by mouth every 6 (six) hours as needed. 01/06/16   Jennye MoccasinBrian S Quigley, MD     Allergies Review of patient's allergies indicates no known  allergies.  No family history on file.  Social History Social History  Substance Use Topics  . Smoking status: Current Every Day Smoker  . Smokeless tobacco: Never Used  . Alcohol use No    Review of Systems  Constitutional: No fever/chills    Cardiovascular: Denies chest pain. Respiratory:No worsening shortness of breath Gastrointestinal: No abdominal pain.   Genitourinary: Negative for dysuria.  Skin: Negative for rash. Neurological: Negative for headaches  10-point ROS otherwise negative.  ____________________________________________   PHYSICAL EXAM:  VITAL SIGNS: ED Triage Vitals  Enc Vitals Group     BP 07/07/16 1724 (!) 150/79     Pulse Rate 07/07/16 1724 91     Resp 07/07/16 1724 18     Temp 07/07/16 1724 97.9 F (36.6 C)     Temp Source 07/07/16 1724 Oral     SpO2 07/07/16 1724 95 %     Weight 07/07/16 1725 282 lb (127.9 kg)     Height 07/07/16 1725 5\' 2"  (1.575 m)     Head Circumference --      Peak Flow --      Pain Score 07/07/16 1725 10     Pain Loc --      Pain Edu? --      Excl. in GC? --     Constitutional: Alert and oriented. No acute distress. Pleasant and interactive Eyes: Conjunctivae are normal.  Head: Atraumatic.   Cardiovascular: Normal rate, regular rhythm. Grossly normal heart sounds.  Good  peripheral circulation. Respiratory: Normal respiratory effort.  No retractions. Bibasilar rales Gastrointestinal: Soft and nontender. No distention.  No CVA tenderness. Genitourinary: deferred Musculoskeletal: One to 2+ edema bilaterally, no calf pain Warm and well perfused Neurologic:  Normal speech and language. No gross focal neurologic deficits are appreciated.  Skin:  Skin is warm, dry and intact. No rash noted. Psychiatric: Mood and affect are normal. Speech and behavior are normal.  ____________________________________________   LABS (all labs ordered are listed, but only abnormal results are displayed)  Labs Reviewed  BASIC  METABOLIC PANEL - Abnormal; Notable for the following:       Result Value   Glucose, Bld 173 (*)    Calcium 8.3 (*)    All other components within normal limits  CBC - Abnormal; Notable for the following:    RDW 14.6 (*)    All other components within normal limits  TROPONIN I   ____________________________________________  EKG  ED ECG REPORT I, Jene Every, the attending physician, personally viewed and interpreted this ECG.  Date: 07/07/2016 EKG Time: 5:30 PM Rate: 91 Rhythm: normal sinus rhythm QRS Axis: normal Intervals: normal ST/T Wave abnormalities: normal Conduction Disturbances: none   ____________________________________________  RADIOLOGY  X-rays consistent with pulmonary edema ____________________________________________   PROCEDURES  Procedure(s) performed: No    Critical Care performed: No ____________________________________________   INITIAL IMPRESSION / ASSESSMENT AND PLAN / ED COURSE  Pertinent labs & imaging results that were available during my care of the patient were reviewed by me and considered in my medical decision making (see chart for details).  Patient with lower extremity edema, hypertension and edema on chest x-ray. I strongly recommended admission to the hospital for diuresis, echo, further eval. Patient refuses admission, her grandson's first birthday is tomorrow. She wants to take by mouth diuretics and if she is not feeling better in 48 hours she will return. Patient has decisional capacity. She understands this is AGAINST MEDICAL ADVICE  Clinical Course   ____________________________________________   FINAL CLINICAL IMPRESSION(S) / ED DIAGNOSES  Final diagnoses:  Chronic pulmonary edema  Congestive heart failure, unspecified congestive heart failure chronicity, unspecified congestive heart failure type (HCC)      NEW MEDICATIONS STARTED DURING THIS VISIT:  New Prescriptions   No medications on file      Note:  This document was prepared using Dragon voice recognition software and may include unintentional dictation errors.    Jene Every, MD 07/07/16 2104

## 2016-07-07 NOTE — Discharge Instructions (Signed)
Please return immediately to the ED if worsening SOB

## 2016-07-07 NOTE — ED Triage Notes (Signed)
C/O bilateral foot swelling x 2 weeks, also c/o bilateral foot pain to 5th metatarsal.

## 2016-07-08 ENCOUNTER — Telehealth: Payer: Self-pay

## 2016-07-08 NOTE — Telephone Encounter (Signed)
Tried to call patient to make ED fu from 07/07/16 seen for Foot Swelling Will try again later

## 2016-07-13 ENCOUNTER — Emergency Department: Payer: Self-pay

## 2016-07-13 ENCOUNTER — Observation Stay
Admission: EM | Admit: 2016-07-13 | Discharge: 2016-07-14 | Disposition: A | Discharge status: Home / Self Care | Payer: Self-pay | Attending: Internal Medicine | Admitting: Internal Medicine

## 2016-07-13 ENCOUNTER — Encounter: Payer: Self-pay | Admitting: Emergency Medicine

## 2016-07-13 DIAGNOSIS — J449 Chronic obstructive pulmonary disease, unspecified: Secondary | ICD-10-CM

## 2016-07-13 DIAGNOSIS — I110 Hypertensive heart disease with heart failure: Secondary | ICD-10-CM | POA: Insufficient documentation

## 2016-07-13 DIAGNOSIS — J4489 Other specified chronic obstructive pulmonary disease: Secondary | ICD-10-CM

## 2016-07-13 DIAGNOSIS — Z6841 Body Mass Index (BMI) 40.0 and over, adult: Secondary | ICD-10-CM | POA: Insufficient documentation

## 2016-07-13 DIAGNOSIS — R079 Chest pain, unspecified: Secondary | ICD-10-CM

## 2016-07-13 DIAGNOSIS — J441 Chronic obstructive pulmonary disease with (acute) exacerbation: Principal | ICD-10-CM | POA: Insufficient documentation

## 2016-07-13 DIAGNOSIS — Z8249 Family history of ischemic heart disease and other diseases of the circulatory system: Secondary | ICD-10-CM | POA: Insufficient documentation

## 2016-07-13 DIAGNOSIS — M7989 Other specified soft tissue disorders: Secondary | ICD-10-CM

## 2016-07-13 DIAGNOSIS — Z79899 Other long term (current) drug therapy: Secondary | ICD-10-CM | POA: Insufficient documentation

## 2016-07-13 DIAGNOSIS — F17210 Nicotine dependence, cigarettes, uncomplicated: Secondary | ICD-10-CM | POA: Insufficient documentation

## 2016-07-13 DIAGNOSIS — R0602 Shortness of breath: Secondary | ICD-10-CM

## 2016-07-13 DIAGNOSIS — I5031 Acute diastolic (congestive) heart failure: Secondary | ICD-10-CM | POA: Insufficient documentation

## 2016-07-13 HISTORY — DX: Heart failure, unspecified: I50.9

## 2016-07-13 LAB — COMPREHENSIVE METABOLIC PANEL
ALK PHOS: 91 U/L (ref 38–126)
ALT: 25 U/L (ref 14–54)
ANION GAP: 8 (ref 5–15)
AST: 20 U/L (ref 15–41)
Albumin: 4.1 g/dL (ref 3.5–5.0)
BUN: 13 mg/dL (ref 6–20)
CALCIUM: 9 mg/dL (ref 8.9–10.3)
CO2: 26 mmol/L (ref 22–32)
CREATININE: 0.61 mg/dL (ref 0.44–1.00)
Chloride: 106 mmol/L (ref 101–111)
Glucose, Bld: 126 mg/dL — ABNORMAL HIGH (ref 65–99)
Potassium: 3.7 mmol/L (ref 3.5–5.1)
SODIUM: 140 mmol/L (ref 135–145)
Total Bilirubin: 0.7 mg/dL (ref 0.3–1.2)
Total Protein: 7.6 g/dL (ref 6.5–8.1)

## 2016-07-13 LAB — BRAIN NATRIURETIC PEPTIDE: B NATRIURETIC PEPTIDE 5: 16 pg/mL (ref 0.0–100.0)

## 2016-07-13 LAB — CBC
HCT: 44.8 % (ref 35.0–47.0)
HEMOGLOBIN: 15 g/dL (ref 12.0–16.0)
MCH: 29.1 pg (ref 26.0–34.0)
MCHC: 33.4 g/dL (ref 32.0–36.0)
MCV: 87.3 fL (ref 80.0–100.0)
PLATELETS: 207 10*3/uL (ref 150–440)
RBC: 5.14 MIL/uL (ref 3.80–5.20)
RDW: 14.2 % (ref 11.5–14.5)
WBC: 9.6 10*3/uL (ref 3.6–11.0)

## 2016-07-13 LAB — TROPONIN I

## 2016-07-13 MED ORDER — METHYLPREDNISOLONE SODIUM SUCC 125 MG IJ SOLR
125.0000 mg | Freq: Once | INTRAMUSCULAR | Status: AC
  Administered 2016-07-13: 125 mg via INTRAVENOUS
  Filled 2016-07-13: qty 2

## 2016-07-13 MED ORDER — ACETAMINOPHEN 325 MG PO TABS: 650.0000 mg | ORAL_TABLET | Freq: Four times a day (QID) | ORAL | Status: DC | PRN

## 2016-07-13 MED ORDER — DOCUSATE SODIUM 100 MG PO CAPS
100.0000 mg | ORAL_CAPSULE | Freq: Two times a day (BID) | ORAL | Status: DC
  Filled 2016-07-13 (×2): qty 1

## 2016-07-13 MED ORDER — ASPIRIN EC 81 MG PO TBEC
81.0000 mg | DELAYED_RELEASE_TABLET | Freq: Every day | ORAL | Status: DC
  Administered 2016-07-13 – 2016-07-14 (×2): 81 mg via ORAL
  Filled 2016-07-13 (×2): qty 1

## 2016-07-13 MED ORDER — SODIUM CHLORIDE 0.9% FLUSH: 3.0000 mL | INTRAVENOUS | Status: DC | PRN

## 2016-07-13 MED ORDER — FLUTICASONE FUROATE-VILANTEROL 200-25 MCG/INH IN AEPB
1.0000 | INHALATION_SPRAY | Freq: Every day | RESPIRATORY_TRACT | Status: DC
  Administered 2016-07-13: 1 via RESPIRATORY_TRACT
  Filled 2016-07-13: qty 28

## 2016-07-13 MED ORDER — LOSARTAN POTASSIUM 50 MG PO TABS
50.0000 mg | ORAL_TABLET | Freq: Every day | ORAL | Status: DC
  Administered 2016-07-13 – 2016-07-14 (×2): 50 mg via ORAL
  Filled 2016-07-13 (×2): qty 1

## 2016-07-13 MED ORDER — ALPRAZOLAM 0.5 MG PO TABS: 0.5000 mg | ORAL_TABLET | Freq: Three times a day (TID) | ORAL | Status: DC | PRN

## 2016-07-13 MED ORDER — SODIUM CHLORIDE 0.9 % IV SOLN: 250.0000 mL | INTRAVENOUS | Status: DC | PRN

## 2016-07-13 MED ORDER — BISACODYL 10 MG RE SUPP: 10.0000 mg | Freq: Every day | RECTAL | Status: DC | PRN

## 2016-07-13 MED ORDER — MORPHINE SULFATE (PF) 2 MG/ML IV SOLN: 2.0000 mg | INTRAVENOUS | Status: DC | PRN

## 2016-07-13 MED ORDER — FUROSEMIDE 40 MG PO TABS
40.0000 mg | ORAL_TABLET | Freq: Every day | ORAL | Status: DC
  Administered 2016-07-13 – 2016-07-14 (×2): 40 mg via ORAL
  Filled 2016-07-13 (×2): qty 1

## 2016-07-13 MED ORDER — NITROGLYCERIN 0.4 MG SL SUBL: 0.4000 mg | SUBLINGUAL_TABLET | SUBLINGUAL | Status: DC | PRN

## 2016-07-13 MED ORDER — ASPIRIN 81 MG PO CHEW
324.0000 mg | CHEWABLE_TABLET | Freq: Once | ORAL | Status: AC
  Administered 2016-07-13: 324 mg via ORAL
  Filled 2016-07-13: qty 4

## 2016-07-13 MED ORDER — SODIUM CHLORIDE 0.9% FLUSH
3.0000 mL | Freq: Two times a day (BID) | INTRAVENOUS | Status: DC
  Administered 2016-07-13 – 2016-07-14 (×2): 3 mL via INTRAVENOUS

## 2016-07-13 MED ORDER — IPRATROPIUM-ALBUTEROL 0.5-2.5 (3) MG/3ML IN SOLN
3.0000 mL | Freq: Once | RESPIRATORY_TRACT | Status: AC
  Administered 2016-07-13: 3 mL via RESPIRATORY_TRACT
  Filled 2016-07-13: qty 3

## 2016-07-13 MED ORDER — ONDANSETRON HCL 4 MG PO TABS: 4.0000 mg | ORAL_TABLET | Freq: Three times a day (TID) | ORAL | Status: DC | PRN

## 2016-07-13 MED ORDER — ACETAMINOPHEN 650 MG RE SUPP: 650.0000 mg | Freq: Four times a day (QID) | RECTAL | Status: DC | PRN

## 2016-07-13 MED ORDER — PANTOPRAZOLE SODIUM 40 MG PO TBEC
40.0000 mg | DELAYED_RELEASE_TABLET | Freq: Every day | ORAL | Status: DC
  Administered 2016-07-13 – 2016-07-14 (×2): 40 mg via ORAL
  Filled 2016-07-13 (×2): qty 1

## 2016-07-13 MED ORDER — SODIUM CHLORIDE 0.9% FLUSH
3.0000 mL | Freq: Two times a day (BID) | INTRAVENOUS | Status: DC
  Administered 2016-07-14: 3 mL via INTRAVENOUS

## 2016-07-13 MED ORDER — FUROSEMIDE 10 MG/ML IJ SOLN
60.0000 mg | Freq: Once | INTRAMUSCULAR | Status: AC
  Administered 2016-07-13: 60 mg via INTRAVENOUS
  Filled 2016-07-13: qty 8

## 2016-07-13 MED ORDER — MELOXICAM 7.5 MG PO TABS
15.0000 mg | ORAL_TABLET | Freq: Every day | ORAL | Status: DC
  Administered 2016-07-13 – 2016-07-14 (×2): 15 mg via ORAL
  Filled 2016-07-13 (×2): qty 2

## 2016-07-13 MED ORDER — TRAMADOL HCL 50 MG PO TABS: 50.0000 mg | ORAL_TABLET | Freq: Four times a day (QID) | ORAL | Status: DC | PRN

## 2016-07-13 MED ORDER — IPRATROPIUM-ALBUTEROL 0.5-2.5 (3) MG/3ML IN SOLN
3.0000 mL | Freq: Four times a day (QID) | RESPIRATORY_TRACT | Status: DC
  Administered 2016-07-13 – 2016-07-14 (×3): 3 mL via RESPIRATORY_TRACT
  Filled 2016-07-13 (×3): qty 3

## 2016-07-13 MED ORDER — HEPARIN SODIUM (PORCINE) 5000 UNIT/ML IJ SOLN
5000.0000 [IU] | Freq: Three times a day (TID) | INTRAMUSCULAR | Status: DC
  Administered 2016-07-13 – 2016-07-14 (×3): 5000 [IU] via SUBCUTANEOUS
  Filled 2016-07-13 (×3): qty 1

## 2016-07-13 MED ORDER — ALBUTEROL SULFATE (2.5 MG/3ML) 0.083% IN NEBU: 3.0000 mL | INHALATION_SOLUTION | RESPIRATORY_TRACT | Status: DC | PRN

## 2016-07-13 NOTE — ED Notes (Signed)
Patient transported to X-ray 

## 2016-07-13 NOTE — ED Triage Notes (Signed)
States was seen 1 week ago - states not getting better. Was put on lasix. Has no pcp.

## 2016-07-13 NOTE — ED Provider Notes (Signed)
[facility] Emergency Department Provider Note  ____________________________________________  Time seen: Approximately 3:19 PM  I have reviewed the triage vital signs and the nursing notes.   HISTORY  Chief Complaint Leg Swelling    HPI Jasmine Larsen is a 50 y.o. female with morbid obesity, COPD with ongoing tobacco abuse, presenting w/ CP, SOB and BLE edema.  The pt reports several weeks of progressive BLE edema w/o calf pain.  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  She describes "it feels like someone is in my chest grabbing something," but denies radiation, diaphoresis, N/V.  She does reports exertional and at rest SOB.  No associated cough/cold, fever or chills.  She was seen here 9/25 for same, signed out AMA after recommendation for admission for possible CHF w/ fluid overload, but failed outpt diuretics as her sx's have continued to worsen.    Past Medical History:  Diagnosis Date  . Asthma   . CHF (congestive heart failure) (HCC)   . COPD (chronic obstructive pulmonary disease) (HCC)   . Multiple gastric ulcers     There are no active problems to display for this patient.   History reviewed. No pertinent surgical history.  Current Outpatient Rx  . Order #: [id number] Class: Print  . Order #: [id number] Class: Print  . Order #: [id number] Class: Print  . Order #: [id number] Class: Print  . Order #: [id number] Class: Print  . Order #: [id number] Class: Print  . Order #: [id number] Class: Print    Allergies Review of patient's allergies indicates no known allergies.  History reviewed. No pertinent family history.  Social History Social History  Substance Use Topics  . Smoking status: Current Every Day Smoker    Packs/day: 2.00    Types: Cigarettes  . Smokeless tobacco: Never Used  . Alcohol use No    Review of Systems Constitutional: No fever/chills.  No lightheadedness or  syncope. Eyes: No visual changes. No eye discharge. ENT: No sore throat. No congestion or rhinorrhea. Cardiovascular: Pos chest pain. Denies palpitations. Respiratory: Pos shortness of breath.  No cough. Gastrointestinal: No abdominal pain.  No nausea, no vomiting.  No diarrhea.  No constipation. Genitourinary: Negative for dysuria. Musculoskeletal: Negative for back pain. Pos BLE edema.  No calf pain. Skin: Negative for rash. Neurological: Negative for headaches. No focal numbness, tingling or weakness.   10-point ROS otherwise negative.  ____________________________________________   PHYSICAL EXAM:  VITAL SIGNS: ED Triage Vitals [07/13/16 1337]  Enc Vitals Group     BP 130/65     Pulse Rate 90     Resp 18     Temp 98.9 F (37.2 C)     Temp Source Oral     SpO2 100 %     Weight 265 lb (120.2 kg)     Height 5\' 2"  (1.575 m)     Head Circumference      Peak Flow      Pain Score 8     Pain Loc      Pain Edu?      Excl. in GC?     Constitutional: Alert and oriented. Chronically ill appearing but in NAD. Answers questions appropriately. Eyes: Conjunctivae are normal.  EOMI. No scleral icterus. No eye discharge. Head: Atraumatic. Nose: No congestion/rhinnorhea. Mouth/Throat: Mucous membranes are moist.  Neck: No stridor.  Supple.  No JVD.  NO meningismus. Cardiovascular: Normal rate, regular rhythm. No murmurs, rubs or gallops.  Respiratory: Normal  respiratory effort.  No accessory muscle use or retractions. + expiratory mild wheezes.  No rales or ronchi.  Gastrointestinal: Obese. Soft, nontender and nondistended.  No guarding or rebound.  No peritoneal signs. Musculoskeletal: + Either edema or fatty lower extremities that are nonpitting. No ttp in the calves or palpable cords.  Negative Homan's sign. Neurologic:  A&Ox3.  Speech is clear.  Face and smile are symmetric.  EOMI.  Moves all extremities well. Skin:  Skin is warm, dry and intact. No rash noted. Psychiatric: Mood  and affect are normal. Speech and behavior are normal.  Normal judgement.  ____________________________________________   LABS (all labs ordered are listed, but only abnormal results are displayed)  Labs Reviewed  COMPREHENSIVE METABOLIC PANEL - Abnormal; Notable for the following:       Result Value   Glucose, Bld 126 (*)    All other components within normal limits  CBC  BRAIN NATRIURETIC PEPTIDE  TROPONIN I   ____________________________________________  EKG     ____________________________________________  RADIOLOGY  Dg Chest 2 View  Result Date: 07/13/2016 CLINICAL DATA:  Pt states she was dx with CHF last week; pt states her legs are still swollen and not getting any better; hx/o COPD. Smoker. EXAM: CHEST  2 VIEW COMPARISON:  07/07/2016 FINDINGS: The heart size and mediastinal contours are within normal limits. Both lungs are mildly hyperexpanded but clear. No pleural effusion or pneumothorax. The visualized skeletal structures are unremarkable. IMPRESSION: No active cardiopulmonary disease. Electronically Signed   By: Amie Portland M.D.   On: 07/13/2016 14:21    ____________________________________________   PROCEDURES  Procedure(s) performed: None  Procedures  Critical Care performed: No ____________________________________________   INITIAL IMPRESSION / ASSESSMENT AND PLAN / ED COURSE  Pertinent labs & imaging results that were available during my care of the patient were reviewed by me and considered in my medical decision making (see chart for details).  50 y.o. F w/ COPD and smoking, no CAD or PE hx, presenting w/ sev wks of CP, SOB and LE swelling.  Today, the pt is hemodynamically stable and has normal oxygenation and heart rate.  From triage, pt has a BNP of 16 and a chest xray that does not show edema.  While CHF is possible, this is less probably.  Consider COPD exacerbation, for which I will treat her.  I will also give her ASA and w/u for ACS/MI  given her CP episodes.  Plan admission to the hospital as she has failed outpatient management of her symptoms.  ----------------------------------------- 4:19 PM on 07/13/2016 -----------------------------------------  The patient has a negative troponin. At this time, I'll plan admission to the hospital. ____________________________________________  FINAL CLINICAL IMPRESSION(S) / ED DIAGNOSES  Final diagnoses:  Chest pain on exertion  Leg swelling  Shortness of breath  COPD exacerbation (HCC)    Clinical Course      NEW MEDICATIONS STARTED DURING THIS VISIT:  New Prescriptions   No medications on file      Rockne Menghini, MD 07/13/16 1620

## 2016-07-13 NOTE — H&P (Signed)
History and Physical    TAMMERA ENGERT Larsen:[id number] DOB: May 15, 1966 DOA: 07/13/2016  Referring physician: Dr. Sharma Covert PCP: No PCP Per Patient  Specialists: none  Chief Complaint: CP and SOB  HPI: Jasmine Larsen is a 50 y.o. female has a past medical history significant for COPD/asthma and morbid obesity who continues to smoke now with exertional CP and worsening SOB. In ER, EKG, CXR, and troponin OK. Currently pain-free. She is now admitted for cardiac w/u. Pt has ? Hx of CHF in the past with no cardiac w/u done. Does c/o LE edema. Has FH of CAD and personal hx of HTN  Review of Systems: The patient denies anorexia, fever, weight loss,, vision loss, decreased hearing, hoarseness, syncope, balance deficits, hemoptysis, abdominal pain, melena, hematochezia, severe indigestion/heartburn, hematuria, incontinence, genital sores, muscle weakness, suspicious skin lesions, transient blindness, difficulty walking, depression, unusual weight change, abnormal bleeding, enlarged lymph nodes, angioedema, and breast masses.   Past Medical History:  Diagnosis Date  . Asthma   . CHF (congestive heart failure) (HCC)   . COPD (chronic obstructive pulmonary disease) (HCC)   . Multiple gastric ulcers    History reviewed. No pertinent surgical history. Social History:  reports that she has been smoking Cigarettes.  She has been smoking about 2.00 packs per day. She has never used smokeless tobacco. She reports that she does not drink alcohol. Her drug history is not on file.  No Known Allergies  History reviewed. No pertinent family history.  Prior to Admission medications   Medication Sig Start Date End Date Taking? Authorizing Provider  furosemide (LASIX) 40 MG tablet Take 1 tablet (40 mg total) by mouth daily. 07/07/16 07/07/17  Jene Every, MD  HYDROcodone-acetaminophen (NORCO/VICODIN) 5-325 MG tablet Take 1 tablet by mouth every 4 (four) hours as needed for moderate pain. 08/24/15   Tommi Rumps, PA-C  meloxicam (MOBIC) 15 MG tablet Take 1 tablet (15 mg total) by mouth daily. 09/10/15   Chinita Pester, FNP  ondansetron (ZOFRAN) 4 MG tablet Take 1 tablet (4 mg total) by mouth every 8 (eight) hours as needed for nausea or vomiting. 01/06/16   Jennye Moccasin, MD  oxyCODONE-acetaminophen (ROXICET) 5-325 MG tablet Take 1 tablet by mouth every 6 (six) hours as needed. 09/10/15   Chinita Pester, FNP  pantoprazole (PROTONIX) 20 MG tablet Take 1 tablet (20 mg total) by mouth daily. 01/06/16 01/05/17  Jennye Moccasin, MD  traMADol (ULTRAM) 50 MG tablet Take 1 tablet (50 mg total) by mouth every 6 (six) hours as needed. 01/06/16   Jennye Moccasin, MD   Physical Exam: Vitals:   07/13/16 1337  BP: 130/65  Pulse: 90  Resp: 18  Temp: 98.9 F (37.2 C)  TempSrc: Oral  SpO2: 100%  Weight: 120.2 kg (265 lb)  Height: 5\' 2"  (1.575 m)     General:  No apparent distress, obese, [place]/AT  Eyes: PERRL, EOMI, no scleral icterus, conjunctiva clear  ENT: moist oropharynx without exudate, TM's benign, dentition fair  Neck: supple, no lymphadenopathy. No bruits or thyromegaly  Cardiovascular: regular rate without MRG; 2+ peripheral pulses, no JVD, trace peripheral edema  Respiratory: CTA biL, good air movement without wheezing, rhonchi or crackled. Respiratory effort normal  Abdomen: soft, non tender to palpation, positive bowel sounds, no guarding, no rebound  Skin: no rashes or lesions  Musculoskeletal: normal bulk and tone, no joint swelling  Psychiatric: normal mood and affect, A&OX3  Neurologic: CN 2-12 grossly intact, Motor  strength  5/5 in all 4 groups with symmetric DTR's and non-focal sensory exam  Labs on Admission:  Basic Metabolic Panel:  Recent Labs Lab 07/07/16 1728 07/13/16 1346  NA 138 140  K 3.8 3.7  CL 105 106  CO2 28 26  GLUCOSE 173* 126*  BUN 12 13  CREATININE 0.54 0.61  CALCIUM 8.3* 9.0   Liver Function Tests:  Recent Labs Lab 07/13/16 1346  AST 20   ALT 25  ALKPHOS 91  BILITOT 0.7  PROT 7.6  ALBUMIN 4.1   No results for input(s): LIPASE, AMYLASE in the last 168 hours. No results for input(s): AMMONIA in the last 168 hours. CBC:  Recent Labs Lab 07/07/16 1728 07/13/16 1346  WBC 7.7 9.6  HGB 14.1 15.0  HCT 40.6 44.8  MCV 87.2 87.3  PLT 192 207   Cardiac Enzymes:  Recent Labs Lab 07/07/16 1728 07/13/16 1346  TROPONINI <0.03 <0.03    BNP (last 3 results)  Recent Labs  07/13/16 1346  BNP 16.0    ProBNP (last 3 results) No results for input(s): PROBNP in the last 8760 hours.  CBG: No results for input(s): GLUCAP in the last 168 hours.  Radiological Exams on Admission: Dg Chest 2 View  Result Date: 07/13/2016 CLINICAL DATA:  Pt states she was dx with CHF last week; pt states her legs are still swollen and not getting any better; hx/o COPD. Smoker. EXAM: CHEST  2 VIEW COMPARISON:  07/07/2016 FINDINGS: The heart size and mediastinal contours are within normal limits. Both lungs are mildly hyperexpanded but clear. No pleural effusion or pneumothorax. The visualized skeletal structures are unremarkable. IMPRESSION: No active cardiopulmonary disease. Electronically Signed   By: Amie Portlandavid  Ormond M.D.   On: 07/13/2016 14:21    EKG: Independently reviewed.  Assessment/Plan Principal Problem:   Chest pain Active Problems:   SOB (shortness of breath)   COPD with asthma (HCC)   Morbid obesity (HCC)   Will observe on telemetry. Follow enzymes. Order echo and Cardiology consult. Optimize pulmonary toilet. Smoking cessation encouraged.   Diet: low salt Fluids: saline lock DVT Prophylaxis: SQ Heparin  Code Status: FULL  Family Communication: none  Disposition Plan: home  Time spent: 50 min

## 2016-07-14 ENCOUNTER — Observation Stay
Admit: 2016-07-14 | Discharge: 2016-07-14 | Disposition: A | Discharge status: Home / Self Care | Payer: Self-pay | Attending: Internal Medicine | Admitting: Internal Medicine

## 2016-07-14 LAB — TROPONIN I
Troponin I: <0.03 ng/mL (ref <0.03)
Troponin I: <0.03 ng/mL (ref <0.03)

## 2016-07-14 LAB — CBC
HEMATOCRIT: 47.5 % — AB (ref 35.0–47.0)
Hemoglobin: 15.5 g/dL (ref 12.0–16.0)
MCH: 28.7 pg (ref 26.0–34.0)
MCHC: 32.7 g/dL (ref 32.0–36.0)
MCV: 87.9 fL (ref 80.0–100.0)
PLATELETS: 216 10*3/uL (ref 150–440)
RBC: 5.41 MIL/uL — AB (ref 3.80–5.20)
RDW: 14.5 % (ref 11.5–14.5)
WBC: 9.5 10*3/uL (ref 3.6–11.0)

## 2016-07-14 LAB — COMPREHENSIVE METABOLIC PANEL
ALT: 25 U/L (ref 14–54)
AST: 15 U/L (ref 15–41)
Albumin: 4 g/dL (ref 3.5–5.0)
Alkaline Phosphatase: 95 U/L (ref 38–126)
Anion gap: 9 (ref 5–15)
BILIRUBIN TOTAL: 0.5 mg/dL (ref 0.3–1.2)
BUN: 13 mg/dL (ref 6–20)
CHLORIDE: 103 mmol/L (ref 101–111)
CO2: 26 mmol/L (ref 22–32)
CREATININE: 0.52 mg/dL (ref 0.44–1.00)
Calcium: 8.8 mg/dL — ABNORMAL LOW (ref 8.9–10.3)
Glucose, Bld: 169 mg/dL — ABNORMAL HIGH (ref 65–99)
Potassium: 4.2 mmol/L (ref 3.5–5.1)
Sodium: 138 mmol/L (ref 135–145)
TOTAL PROTEIN: 7.9 g/dL (ref 6.5–8.1)

## 2016-07-14 MED ORDER — LOSARTAN POTASSIUM 50 MG PO TABS: 50.0000 mg | ORAL_TABLET | Freq: Every day | ORAL | 1 refills | Status: DC

## 2016-07-14 MED ORDER — FUROSEMIDE 40 MG PO TABS: 40.0000 mg | ORAL_TABLET | Freq: Every day | ORAL | 1 refills | Status: DC

## 2016-07-14 MED ORDER — IPRATROPIUM-ALBUTEROL 0.5-2.5 (3) MG/3ML IN SOLN: 3.0000 mL | Freq: Four times a day (QID) | RESPIRATORY_TRACT | Status: DC | PRN

## 2016-07-14 MED ORDER — METOPROLOL SUCCINATE ER 25 MG PO TB24: 25.0000 mg | ORAL_TABLET | Freq: Every day | ORAL | 1 refills | Status: DC

## 2016-07-14 NOTE — Consult Note (Signed)
[facility] Cardiology Consultation Note  Patient ID: AMRI LIEN, MRN: [id number], DOB/AGE: 48-Feb-1967 48 y.o. Admit date: 07/13/2016   Date of Consult: 07/14/2016 Primary Physician: No PCP Per Patient Primary Cardiologist: None  Chief Complaint:  Chief Complaint  Patient presents with  . Leg Swelling   Reason for Consult: chronicus active pulmonary disease with acute diastolic dysfunction heart failure  HPI: 50 y.o. female with apparent previous history of diastolic dysfunction heart failure with some exacerbation of COPD hypoxia causing some right sided chest pressure and pain with no evidence of EKG changes or elevation of troponin. The patient did respond directly to appropriate medication management including intravenous Lasix with significant drop in lower extremity edema and pulmonary edema and improvements of hypoxia. After that the patient has felt much better and there is no further issues at this time. The patient has had apparent essential hypertension well treated on angiotensin receptor blocker. There does not appear to be any sleep apnea on this time although the patient is significantly overweight  Past Medical History:  Diagnosis Date  . Asthma   . CHF (congestive heart failure) (HCC)   . COPD (chronic obstructive pulmonary disease) (HCC)   . Multiple gastric ulcers       Surgical History: History reviewed. No pertinent surgical history.   Home Meds: Prior to Admission medications   Medication Sig Start Date End Date Taking? Authorizing Provider  acetaminophen (TYLENOL) 325 MG tablet Take 650 mg by mouth every 6 (six) hours as needed for mild pain or moderate pain.   Yes Historical Provider, MD  furosemide (LASIX) 40 MG tablet Take 1 tablet (40 mg total) by mouth daily. 07/07/16 07/07/17 Yes Jene Every, MD    Inpatient Medications:  . aspirin EC  81 mg Oral Daily  . docusate sodium  100 mg Oral BID  . fluticasone furoate-vilanterol  1 puff Inhalation  Daily  . furosemide  40 mg Oral Daily  . heparin  5,000 Units Subcutaneous Q8H  . losartan  50 mg Oral Daily  . meloxicam  15 mg Oral Daily  . pantoprazole  40 mg Oral Daily  . sodium chloride flush  3 mL Intravenous Q12H  . sodium chloride flush  3 mL Intravenous Q12H      Allergies:  Allergies  Allergen Reactions  . Hydrocodone     Other reaction(s): Headache    Social History   Social History  . Marital status: Single    Spouse name: N/A  . Number of children: N/A  . Years of education: N/A   Occupational History  . Not on file.   Social History Main Topics  . Smoking status: Current Every Day Smoker    Packs/day: 2.00    Types: Cigarettes  . Smokeless tobacco: Never Used  . Alcohol use No  . Drug use: No  . Sexual activity: Not on file   Other Topics Concern  . Not on file   Social History Narrative  . No narrative on file     History reviewed. No pertinent family history.   Review of Systems Positive for Shortness of breath Negative for: General:  chills, fever, night sweats or weight changes.  Cardiovascular: PND orthopnea syncope dizziness  Dermatological skin lesions rashes Respiratory: Cough congestion Urologic: Frequent urination urination at night and hematuria Abdominal: negative for nausea, vomiting, diarrhea, bright red blood per rectum, melena, or hematemesis Neurologic: negative for visual changes, and/or hearing changes  All other systems reviewed and are otherwise negative  except as noted above.  Labs:  Recent Labs  07/13/16 1346 07/13/16 1844 07/14/16 0018 07/14/16 0642  TROPONINI <0.03 <0.03 <0.03 <0.03   Lab Results  Component Value Date   WBC 9.5 07/14/2016   HGB 15.5 07/14/2016   HCT 47.5 (H) 07/14/2016   MCV 87.9 07/14/2016   PLT 216 07/14/2016    Recent Labs Lab 07/14/16 0642  NA 138  K 4.2  CL 103  CO2 26  BUN 13  CREATININE 0.52  CALCIUM 8.8*  PROT 7.9  BILITOT 0.5  ALKPHOS 95  ALT 25  AST 15   GLUCOSE 169*   No results found for: CHOL, HDL, LDLCALC, TRIG No results found for: DDIMER  Radiology/Studies:  Dg Chest 2 View  Result Date: 07/13/2016 CLINICAL DATA:  Pt states she was dx with CHF last week; pt states her legs are still swollen and not getting any better; hx/o COPD. Smoker. EXAM: CHEST  2 VIEW COMPARISON:  07/07/2016 FINDINGS: The heart size and mediastinal contours are within normal limits. Both lungs are mildly hyperexpanded but clear. No pleural effusion or pneumothorax. The visualized skeletal structures are unremarkable. IMPRESSION: No active cardiopulmonary disease. Electronically Signed   By: Amie Portland M.D.   On: 07/13/2016 14:21   Dg Chest 2 View  Result Date: 07/07/2016 CLINICAL DATA:  Bilateral foot swelling for 2 weeks. EXAM: CHEST  2 VIEW COMPARISON:  December 01, 2009 FINDINGS: The heart size and mediastinal contours are within normal limits. There is increased pulmonary interstitium bilaterally. There is no focal pneumonia, pleural effusion. The visualized skeletal structures are unremarkable. IMPRESSION: Pulmonary edema. Electronically Signed   By: Sherian Rein M.D.   On: 07/07/2016 18:09   US Venous Img Lower Bilateral  Result Date: 07/13/2016 CLINICAL DATA:  50 year old female with bilateral lower extremity edema x2 weeks. EXAM: BILATERAL LOWER EXTREMITY VENOUS DOPPLER ULTRASOUND TECHNIQUE: Gray-scale sonography with graded compression, as well as color Doppler and duplex ultrasound were performed to evaluate the lower extremity deep venous systems from the level of the common femoral vein and including the common femoral, femoral, profunda femoral, popliteal and calf veins including the posterior tibial, peroneal and gastrocnemius veins when visible. The superficial great saphenous vein was also interrogated. Spectral Doppler was utilized to evaluate flow at rest and with distal augmentation maneuvers in the common femoral, femoral and popliteal veins.  COMPARISON:  None. FINDINGS: RIGHT LOWER EXTREMITY Common Femoral Vein: No evidence of thrombus. Normal compressibility, respiratory phasicity and response to augmentation. Saphenofemoral Junction: No evidence of thrombus. Normal compressibility and flow on color Doppler imaging. Profunda Femoral Vein: No evidence of thrombus. Normal compressibility and flow on color Doppler imaging. Femoral Vein: No evidence of thrombus. Normal compressibility, respiratory phasicity and response to augmentation. Popliteal Vein: No evidence of thrombus. Normal compressibility, respiratory phasicity and response to augmentation. Calf Veins: The posterior tibial vein appear patent. The peroneal vein is not well visualized. Superficial Great Saphenous Vein: No evidence of thrombus. Normal compressibility and flow on color Doppler imaging. Venous Reflux:  None. Other Findings:  None. LEFT LOWER EXTREMITY Common Femoral Vein: No evidence of thrombus. Normal compressibility, respiratory phasicity and response to augmentation. Saphenofemoral Junction: No evidence of thrombus. Normal compressibility and flow on color Doppler imaging. Profunda Femoral Vein: No evidence of thrombus. Normal compressibility and flow on color Doppler imaging. Femoral Vein: No evidence of thrombus. Normal compressibility, respiratory phasicity and response to augmentation. Popliteal Vein: The posterior tibial vein appears patent. Limited visualization of the peroneal vein. Calf  Veins: No evidence of thrombus. Normal compressibility and flow on color Doppler imaging. Superficial Great Saphenous Vein: No evidence of thrombus. Normal compressibility and flow on color Doppler imaging. Venous Reflux:  None. Other Findings:  None. IMPRESSION: No evidence of deep venous thrombosis in the bilateral lower extremities. Electronically Signed   By: Elgie CollardArash  Radparvar M.D.   On: 07/13/2016 18:33    EKG: Normal sinus rhythm  Weights: Filed Weights   07/13/16 1337 07/14/16  0423  Weight: 120.2 kg (265 lb) (!) 155.8 kg (343 lb 8 oz)     Physical Exam: Blood pressure 129/62, pulse 95, temperature 97.8 F (36.6 C), temperature source Oral, resp. rate 14, height 5\' 2"  (1.575 m), weight (!) 155.8 kg (343 lb 8 oz), SpO2 99 %. Body mass index is 62.83 kg/m. General: Well developed, well nourished, in no acute distress. Head eyes ears nose throat: Normocephalic, atraumatic, sclera non-icteric, no xanthomas, nares are without discharge. No apparent thyromegaly and/or mass  Lungs: Normal respiratory effort.  no wheezes, no rales, no rhonchi.  Heart: RRR with normal S1 S2. no murmur gallop, no rub, PMI is normal size and placement, carotid upstroke normal without bruit, jugular venous pressure is normal Abdomen: Soft, non-tender,  distended with normoactive bowel sounds. No hepatomegaly. No rebound/guarding. No obvious abdominal masses. Abdominal aorta is normal size without bruit Extremities: 1+ edema. no cyanosis, no clubbing, no ulcers  Peripheral : 2+ bilateral upper extremity pulses, 0 + bilateral femoral pulses, 0 + bilateral dorsal pedal pulse Neuro: Alert and oriented. No facial asymmetry. No focal deficit. Moves all extremities spontaneously. Musculoskeletal: Normal muscle tone without kyphosis Psych:  Responds to questions appropriately with a normal affect.    Assessment: 50 year old female with significant obesity possible sleep apnea with acute diastolic dysfunction heart failure responding very well to appropriate medication management now significantly improved without evidence of myocardial infarction  Plan: 1. Continue furosemide orally for further risk reduction in recurrence of diastolic dysfunction and pulmonary edema 2. Continue losartan and consider low-dose metoprolol for diastolic dysfunction heart failure treatment 3. Patient was counseled on the oral intake and low-sodium diet as well as low calorie diet or obesity 4. No further cardiac  diagnostics necessary at this time 5. Okay for discharge home from cardiac standpoint with follow-up thereafter for adjustments of medication management  Signed, Lamar BlinksKOWALSKI,Larua Collier J M.D. [facility] 07/14/2016, 1:40 PM

## 2016-07-14 NOTE — Progress Notes (Signed)
Clinical Child psychotherapistocial Worker (CSW) received consult for "other". Per RN patient needs assistance with medications and is self-pay. RN case manager consult is in. Please reconsult if future social work needs arise. CSW signing off.   Baker Hughes IncorporatedBailey Ranette Luckadoo, LCSW [phone]

## 2016-07-14 NOTE — Care Management (Signed)
Patient placed in observation for chest pain.  She is employed full time at [employer].  She does not have any insurance and must pay full price for her medications.  Uses [facility] and aware of the four dollar list. Does not have a primary care physician- uses the ED when has issues.  Patient has never used [facility].  Notified attending of the need to have discharge scripts asap.  Provided patient with application and criteria for both clinics.  Faxed her scripts to the clinic.  Discussed the need to initiate her application process.

## 2016-07-14 NOTE — Progress Notes (Signed)
*  PRELIMINARY RESULTS* Echocardiogram 2D Echocardiogram has been performed.  Cristela BlueHege, Josuha Fontanez 07/14/2016, 1:42 PM

## 2016-07-14 NOTE — Care Management (Signed)
CM dept hand delivered patient's medications from [facility] to patient

## 2016-07-14 NOTE — Progress Notes (Signed)
Discharge instructions given to patient. Educated patient on importance of not smoking and following up with primary physician as well as keeping appointments. Patient verbalized understanding. IV and tele removed. Patient in no distress at this time and is awaiting transportation.

## 2016-07-14 NOTE — Progress Notes (Signed)
Pt.'s severity score was 4 on the RT protocol assessment. Because of this nebulizer treatments were changed to prn. She was made aware of this and will call for a nebulizer treatment if needed.

## 2016-07-15 LAB — ECHOCARDIOGRAM COMPLETE
Height: 62 in
Weight: 5496 oz

## 2016-07-16 NOTE — Telephone Encounter (Signed)
lmov for  patient to call back and make ED fu from 07/07/16 seen for Foot Swelling ° °

## 2016-07-18 NOTE — Discharge Summary (Signed)
[facility] [place] at [facility]   PATIENT NAME: Jasmine Larsen    MR#:  [id number]  DATE OF BIRTH:  04/12/1966  DATE OF ADMISSION:  07/13/2016 ADMITTING PHYSICIAN: Marguarite Arbour, MD  DATE OF DISCHARGE: 07/14/2016  5:23 PM  PRIMARY CARE PHYSICIAN: No PCP Per Patient    ADMISSION DIAGNOSIS:  Shortness of breath [R06.02] Leg swelling [M79.89] COPD exacerbation (HCC) [J44.1] Chest pain on exertion [R07.9]  DISCHARGE DIAGNOSIS:  Principal Problem:   Chest pain Active Problems:   SOB (shortness of breath)   COPD with asthma (HCC)   Morbid obesity (HCC)   Ac diastolic CHF  SECONDARY DIAGNOSIS:   Past Medical History:  Diagnosis Date  . Asthma   . CHF (congestive heart failure) (HCC)   . COPD (chronic obstructive pulmonary disease) (HCC)   . Multiple gastric ulcers     HOSPITAL COURSE:   Presented with respi distress- kept on 24 hour observation, was given inj lasix and nebs treatment, and echo done with cardiology eval. It showed EF of 65%, as pt improved , no further work ups advised by cardiologist, and advised to continue same meds.  DISCHARGE CONDITIONS:   Stable.  CONSULTS OBTAINED:  Treatment Team:  Lamar Blinks, MD  DRUG ALLERGIES:   Allergies  Allergen Reactions  . Hydrocodone     Other reaction(s): Headache    DISCHARGE MEDICATIONS:   Discharge Medication List as of 07/14/2016  4:16 PM    START taking these medications   Details  losartan (COZAAR) 50 MG tablet Take 1 tablet (50 mg total) by mouth daily., Starting Tue 07/15/2016, Print    metoprolol succinate (TOPROL XL) 25 MG 24 hr tablet Take 1 tablet (25 mg total) by mouth daily., Starting Mon 07/14/2016, Print      CONTINUE these medications which have CHANGED   Details  furosemide (LASIX) 40 MG tablet Take 1 tablet (40 mg total) by mouth daily., Starting Mon 07/14/2016, Until Tue 07/14/2017, Print      STOP taking these medications     acetaminophen  (TYLENOL) 325 MG tablet      meloxicam (MOBIC) 15 MG tablet      ondansetron (ZOFRAN) 4 MG tablet      traMADol (ULTRAM) 50 MG tablet          DISCHARGE INSTRUCTIONS:    Follow with PMD in 1-2 weeks.  If you experience worsening of your admission symptoms, develop shortness of breath, life threatening emergency, suicidal or homicidal thoughts you must seek medical attention immediately by calling 911 or calling your MD immediately  if symptoms less severe.  You Must read complete instructions/literature along with all the possible adverse reactions/side effects for all the Medicines you take and that have been prescribed to you. Take any new Medicines after you have completely understood and accept all the possible adverse reactions/side effects.   Please note  You were cared for by a hospitalist during your hospital stay. If you have any questions about your discharge medications or the care you received while you were in the hospital after you are discharged, you can call the unit and asked to speak with the hospitalist on call if the hospitalist that took care of you is not available. Once you are discharged, your primary care physician will handle any further medical issues. Please note that NO REFILLS for any discharge medications will be authorized once you are discharged, as it is imperative that you return to your primary care  physician (or establish a relationship with a primary care physician if you do not have one) for your aftercare needs so that they can reassess your need for medications and monitor your lab values.    Today   CHIEF COMPLAINT:   Chief Complaint  Patient presents with  . Leg Swelling    HISTORY OF PRESENT ILLNESS:  Jasmine Larsen  is a 50 y.o. female with a known history of COPD/asthma and morbid obesity who continues to smoke now with exertional CP and worsening SOB. In ER, EKG, CXR, and troponin OK. Currently pain-free. She is now admitted for  cardiac w/u. Pt has ? Hx of CHF in the past with no cardiac w/u done. Does c/o LE edema. Has FH of CAD and personal hx of HTN   VITAL SIGNS:  Blood pressure 129/62, pulse 95, temperature 97.8 F (36.6 C), temperature source Oral, resp. rate 14, height 5\' 2"  (1.575 m), weight (!) 155.8 kg (343 lb 8 oz), SpO2 99 %.  I/O:  No intake or output data in the 24 hours ending 07/18/16 1544  PHYSICAL EXAMINATION:  GENERAL:  50 y.o.-year-old patient lying in the bed with no acute distress.  EYES: Pupils equal, round, reactive to light and accommodation. No scleral icterus. Extraocular muscles intact.  HEENT: Head atraumatic, normocephalic. Oropharynx and nasopharynx clear.  NECK:  Supple, no jugular venous distention. No thyroid enlargement, no tenderness.  LUNGS: Normal breath sounds bilaterally, no wheezing, rales,rhonchi or crepitation. No use of accessory muscles of respiration.  CARDIOVASCULAR: S1, S2 normal. No murmurs, rubs, or gallops.  ABDOMEN: Soft, non-tender, non-distended. Bowel sounds present. No organomegaly or mass.  EXTREMITIES: No pedal edema, cyanosis, or clubbing.  NEUROLOGIC: Cranial nerves II through XII are intact. Muscle strength 5/5 in all extremities. Sensation intact. Gait not checked.  PSYCHIATRIC: The patient is alert and oriented x 3.  SKIN: No obvious rash, lesion, or ulcer.   DATA REVIEW:   CBC  Recent Labs Lab 07/14/16 0642  WBC 9.5  HGB 15.5  HCT 47.5*  PLT 216    Chemistries   Recent Labs Lab 07/14/16 0642  NA 138  K 4.2  CL 103  CO2 26  GLUCOSE 169*  BUN 13  CREATININE 0.52  CALCIUM 8.8*  AST 15  ALT 25  ALKPHOS 95  BILITOT 0.5    Cardiac Enzymes  Recent Labs Lab 07/14/16 0642  TROPONINI <0.03    Microbiology Results  No results found for this or any previous visit.  RADIOLOGY:  No results found.  EKG:   Orders placed or performed during the hospital encounter of 07/07/16  . ED EKG within 10 minutes  . ED EKG within 10  minutes  . EKG 12-Lead  . EKG 12-Lead  . EKG      Management plans discussed with the patient, family and they are in agreement.  CODE STATUS:  Code Status History    Date Active Date Inactive Code Status Order ID Comments User Context   07/13/2016  5:50 PM 07/14/2016  9:58 AM Full Code [id number]  Marguarite ArbourJeffrey D Sparks, MD Inpatient      TOTAL TIME TAKING CARE OF THIS PATIENT: 35 minutes.    Altamese DillingVACHHANI, Tesa Meadors M.D on 07/18/2016 at 3:44 PM  Between 7am to 6pm - Pager - [phone]  After 6pm go to [email or web address] - Social research officer, governmentpassword [email or web address]  [facility]  Office  [phone]  CC: Primary care physician; No PCP Per Patient   Note: This dictation was prepared with  Dragon dictation along with smaller Company secretary. Any transcriptional errors that result from this process are unintentional.

## 2016-07-25 NOTE — Telephone Encounter (Signed)
lmov for  patient to call back and make ED fu from 07/07/16 seen for Foot Swelling

## 2020-07-19 ENCOUNTER — Other Ambulatory Visit: Payer: Self-pay

## 2020-07-19 ENCOUNTER — Inpatient Hospital Stay
Admission: EM | Admit: 2020-07-19 | Discharge: 2020-07-22 | DRG: 177 | Disposition: A | Discharge status: Home / Self Care | Payer: HRSA Program | Attending: Internal Medicine | Admitting: Internal Medicine

## 2020-07-19 ENCOUNTER — Encounter: Payer: Self-pay | Admitting: Emergency Medicine

## 2020-07-19 ENCOUNTER — Emergency Department: Payer: HRSA Program

## 2020-07-19 ENCOUNTER — Inpatient Hospital Stay: Payer: HRSA Program

## 2020-07-19 DIAGNOSIS — E1165 Type 2 diabetes mellitus with hyperglycemia: Secondary | ICD-10-CM | POA: Diagnosis present

## 2020-07-19 DIAGNOSIS — J9601 Acute respiratory failure with hypoxia: Secondary | ICD-10-CM | POA: Diagnosis present

## 2020-07-19 DIAGNOSIS — A0839 Other viral enteritis: Secondary | ICD-10-CM | POA: Diagnosis present

## 2020-07-19 DIAGNOSIS — U071 COVID-19: Principal | ICD-10-CM

## 2020-07-19 DIAGNOSIS — J440 Chronic obstructive pulmonary disease with acute lower respiratory infection: Secondary | ICD-10-CM | POA: Diagnosis present

## 2020-07-19 DIAGNOSIS — Z8616 Personal history of COVID-19: Secondary | ICD-10-CM

## 2020-07-19 DIAGNOSIS — I5032 Chronic diastolic (congestive) heart failure: Secondary | ICD-10-CM | POA: Diagnosis present

## 2020-07-19 DIAGNOSIS — Z6841 Body Mass Index (BMI) 40.0 and over, adult: Secondary | ICD-10-CM | POA: Diagnosis not present

## 2020-07-19 DIAGNOSIS — F17210 Nicotine dependence, cigarettes, uncomplicated: Secondary | ICD-10-CM | POA: Diagnosis present

## 2020-07-19 DIAGNOSIS — R079 Chest pain, unspecified: Secondary | ICD-10-CM | POA: Diagnosis present

## 2020-07-19 DIAGNOSIS — Z833 Family history of diabetes mellitus: Secondary | ICD-10-CM

## 2020-07-19 DIAGNOSIS — J449 Chronic obstructive pulmonary disease, unspecified: Secondary | ICD-10-CM | POA: Diagnosis not present

## 2020-07-19 DIAGNOSIS — R197 Diarrhea, unspecified: Secondary | ICD-10-CM | POA: Diagnosis present

## 2020-07-19 DIAGNOSIS — R0902 Hypoxemia: Secondary | ICD-10-CM | POA: Diagnosis present

## 2020-07-19 DIAGNOSIS — Z8711 Personal history of peptic ulcer disease: Secondary | ICD-10-CM

## 2020-07-19 DIAGNOSIS — J1282 Pneumonia due to coronavirus disease 2019: Secondary | ICD-10-CM | POA: Diagnosis present

## 2020-07-19 DIAGNOSIS — Z720 Tobacco use: Secondary | ICD-10-CM | POA: Diagnosis present

## 2020-07-19 DIAGNOSIS — E876 Hypokalemia: Secondary | ICD-10-CM | POA: Diagnosis present

## 2020-07-19 DIAGNOSIS — J189 Pneumonia, unspecified organism: Secondary | ICD-10-CM

## 2020-07-19 DIAGNOSIS — R112 Nausea with vomiting, unspecified: Secondary | ICD-10-CM | POA: Diagnosis present

## 2020-07-19 HISTORY — DX: Personal history of COVID-19: Z86.16

## 2020-07-19 LAB — EXPECTORATED SPUTUM ASSESSMENT W GRAM STAIN, RFLX TO RESP C

## 2020-07-19 LAB — C DIFFICILE QUICK SCREEN W PCR REFLEX
C Diff antigen: NEGATIVE
C Diff interpretation: NOT DETECTED
C Diff toxin: NEGATIVE

## 2020-07-19 LAB — PROCALCITONIN: Procalcitonin: <0.1 ng/mL

## 2020-07-19 LAB — URINALYSIS, COMPLETE (UACMP) WITH MICROSCOPIC
Bilirubin Urine: NEGATIVE
Glucose, UA: >=500 mg/dL — AB
Ketones, ur: 20 mg/dL — AB
Nitrite: NEGATIVE
Protein, ur: 30 mg/dL — AB
RBC / HPF: >50 RBC/hpf — ABNORMAL HIGH (ref 0–5)
Specific Gravity, Urine: >1.046 — ABNORMAL HIGH (ref 1.005–1.030)
pH: 5 (ref 5.0–8.0)

## 2020-07-19 LAB — RESPIRATORY PANEL BY RT PCR (FLU A&B, COVID)
Influenza A by PCR: NEGATIVE
Influenza B by PCR: NEGATIVE
SARS Coronavirus 2 by RT PCR: POSITIVE — AB

## 2020-07-19 LAB — COMPREHENSIVE METABOLIC PANEL
ALT: 26 U/L (ref 0–44)
AST: 28 U/L (ref 15–41)
Albumin: 3.4 g/dL — ABNORMAL LOW (ref 3.5–5.0)
Alkaline Phosphatase: 116 U/L (ref 38–126)
Anion gap: 13 (ref 5–15)
BUN: 9 mg/dL (ref 6–20)
CO2: 25 mmol/L (ref 22–32)
Calcium: 6.6 mg/dL — ABNORMAL LOW (ref 8.9–10.3)
Chloride: 97 mmol/L — ABNORMAL LOW (ref 98–111)
Creatinine, Ser: 0.83 mg/dL (ref 0.44–1.00)
GFR calc non Af Amer: >60 mL/min (ref >60)
Glucose, Bld: 251 mg/dL — ABNORMAL HIGH (ref 70–99)
Potassium: 2.9 mmol/L — ABNORMAL LOW (ref 3.5–5.1)
Sodium: 135 mmol/L (ref 135–145)
Total Bilirubin: 1.2 mg/dL (ref 0.3–1.2)
Total Protein: 7.8 g/dL (ref 6.5–8.1)

## 2020-07-19 LAB — CBC
HCT: 45.5 % (ref 36.0–46.0)
Hemoglobin: 15.1 g/dL — ABNORMAL HIGH (ref 12.0–15.0)
MCH: 28.4 pg (ref 26.0–34.0)
MCHC: 33.2 g/dL (ref 30.0–36.0)
MCV: 85.5 fL (ref 80.0–100.0)
Platelets: 318 10*3/uL (ref 150–400)
RBC: 5.32 MIL/uL — ABNORMAL HIGH (ref 3.87–5.11)
RDW: 13.8 % (ref 11.5–15.5)
WBC: 7.5 10*3/uL (ref 4.0–10.5)
nRBC: 0 % (ref 0.0–0.2)

## 2020-07-19 LAB — TRIGLYCERIDES: Triglycerides: 167 mg/dL — ABNORMAL HIGH (ref <150)

## 2020-07-19 LAB — TROPONIN I (HIGH SENSITIVITY)
Troponin I (High Sensitivity): 7 ng/L (ref <18)
Troponin I (High Sensitivity): 6 ng/L (ref <18)

## 2020-07-19 LAB — LIPASE, BLOOD: Lipase: 23 U/L (ref 11–51)

## 2020-07-19 LAB — BRAIN NATRIURETIC PEPTIDE: B Natriuretic Peptide: 49.8 pg/mL (ref 0.0–100.0)

## 2020-07-19 LAB — MAGNESIUM: Magnesium: 2 mg/dL (ref 1.7–2.4)

## 2020-07-19 LAB — HEPATITIS B SURFACE ANTIGEN: Hepatitis B Surface Ag: NONREACTIVE

## 2020-07-19 LAB — FIBRIN DERIVATIVES D-DIMER (ARMC ONLY): Fibrin derivatives D-dimer (ARMC): 994.84 ng/mL (FEU) — ABNORMAL HIGH (ref 0.00–499.00)

## 2020-07-19 LAB — FERRITIN: Ferritin: 567 ng/mL — ABNORMAL HIGH (ref 11–307)

## 2020-07-19 LAB — FIBRINOGEN: Fibrinogen: 551 mg/dL — ABNORMAL HIGH (ref 210–475)

## 2020-07-19 LAB — LACTATE DEHYDROGENASE: LDH: 464 U/L — ABNORMAL HIGH (ref 98–192)

## 2020-07-19 LAB — PHOSPHORUS: Phosphorus: 1.3 mg/dL — ABNORMAL LOW (ref 2.5–4.6)

## 2020-07-19 LAB — C-REACTIVE PROTEIN: CRP: 12.3 mg/dL — ABNORMAL HIGH (ref <1.0)

## 2020-07-19 MED ORDER — METHYLPREDNISOLONE SODIUM SUCC 125 MG IJ SOLR
80.0000 mg | Freq: Two times a day (BID) | INTRAMUSCULAR | Status: DC
  Administered 2020-07-20: 80 mg via INTRAVENOUS
  Filled 2020-07-19: qty 2

## 2020-07-19 MED ORDER — DM-GUAIFENESIN ER 30-600 MG PO TB12: 1.0000 | ORAL_TABLET | Freq: Two times a day (BID) | ORAL | Status: DC | PRN

## 2020-07-19 MED ORDER — ASCORBIC ACID 500 MG PO TABS
500.0000 mg | ORAL_TABLET | Freq: Every day | ORAL | Status: DC
  Administered 2020-07-19 – 2020-07-22 (×4): 500 mg via ORAL
  Filled 2020-07-19 (×4): qty 1

## 2020-07-19 MED ORDER — PNEUMOCOCCAL VAC POLYVALENT 25 MCG/0.5ML IJ INJ: 0.5000 mL | INJECTION | INTRAMUSCULAR | Status: DC

## 2020-07-19 MED ORDER — IPRATROPIUM BROMIDE HFA 17 MCG/ACT IN AERS
2.0000 | INHALATION_SPRAY | RESPIRATORY_TRACT | Status: DC
  Administered 2020-07-19 – 2020-07-22 (×15): 2 via RESPIRATORY_TRACT
  Filled 2020-07-19: qty 12.9

## 2020-07-19 MED ORDER — MAGNESIUM SULFATE IN D5W 1-5 GM/100ML-% IV SOLN
1.0000 g | Freq: Once | INTRAVENOUS | Status: AC
  Administered 2020-07-19: 17:00:00 1 g via INTRAVENOUS
  Filled 2020-07-19: qty 100

## 2020-07-19 MED ORDER — MORPHINE SULFATE (PF) 2 MG/ML IV SOLN
2.0000 mg | INTRAVENOUS | Status: DC | PRN
  Administered 2020-07-19: 2 mg via INTRAVENOUS
  Filled 2020-07-19: qty 1

## 2020-07-19 MED ORDER — SODIUM CHLORIDE 0.9 % IV SOLN
100.0000 mg | Freq: Every day | INTRAVENOUS | Status: DC
  Administered 2020-07-20 – 2020-07-22 (×3): 100 mg via INTRAVENOUS
  Filled 2020-07-19 (×4): qty 20

## 2020-07-19 MED ORDER — METHYLPREDNISOLONE SODIUM SUCC 125 MG IJ SOLR
125.0000 mg | Freq: Once | INTRAMUSCULAR | Status: AC
  Administered 2020-07-19: 125 mg via INTRAVENOUS
  Filled 2020-07-19: qty 2

## 2020-07-19 MED ORDER — CALCIUM GLUCONATE-NACL 2-0.675 GM/100ML-% IV SOLN
2.0000 g | Freq: Once | INTRAVENOUS | Status: AC
  Administered 2020-07-19: 2000 mg via INTRAVENOUS
  Filled 2020-07-19: qty 100

## 2020-07-19 MED ORDER — ENOXAPARIN SODIUM 40 MG/0.4ML ~~LOC~~ SOLN
40.0000 mg | Freq: Two times a day (BID) | SUBCUTANEOUS | Status: DC
  Administered 2020-07-19 – 2020-07-21 (×3): 40 mg via SUBCUTANEOUS
  Filled 2020-07-19 (×3): qty 0.4

## 2020-07-19 MED ORDER — ONDANSETRON HCL 4 MG PO TABS: 4.0000 mg | ORAL_TABLET | Freq: Four times a day (QID) | ORAL | Status: DC | PRN

## 2020-07-19 MED ORDER — IOHEXOL 350 MG/ML SOLN
75.0000 mL | Freq: Once | INTRAVENOUS | Status: AC | PRN
  Administered 2020-07-19: 18:00:00 75 mL via INTRAVENOUS

## 2020-07-19 MED ORDER — ZINC SULFATE 220 (50 ZN) MG PO CAPS
220.0000 mg | ORAL_CAPSULE | Freq: Every day | ORAL | Status: DC
  Administered 2020-07-19 – 2020-07-22 (×4): 220 mg via ORAL
  Filled 2020-07-19 (×4): qty 1

## 2020-07-19 MED ORDER — INFLUENZA VAC SPLIT QUAD 0.5 ML IM SUSY
0.5000 mL | PREFILLED_SYRINGE | INTRAMUSCULAR | Status: DC
  Filled 2020-07-19: qty 0.5

## 2020-07-19 MED ORDER — POTASSIUM CHLORIDE CRYS ER 20 MEQ PO TBCR
40.0000 meq | EXTENDED_RELEASE_TABLET | ORAL | Status: AC
  Administered 2020-07-19 (×2): 40 meq via ORAL
  Filled 2020-07-19 (×2): qty 2

## 2020-07-19 MED ORDER — NICOTINE 21 MG/24HR TD PT24
21.0000 mg | MEDICATED_PATCH | Freq: Every day | TRANSDERMAL | Status: DC
  Filled 2020-07-19: qty 1

## 2020-07-19 MED ORDER — HYDRALAZINE HCL 20 MG/ML IJ SOLN: 5.0000 mg | INTRAMUSCULAR | Status: DC | PRN

## 2020-07-19 MED ORDER — OXYCODONE-ACETAMINOPHEN 5-325 MG PO TABS
1.0000 | ORAL_TABLET | ORAL | Status: DC | PRN
  Administered 2020-07-20 – 2020-07-22 (×3): 1 via ORAL
  Filled 2020-07-19 (×3): qty 1

## 2020-07-19 MED ORDER — ACETAMINOPHEN 325 MG PO TABS: 650.0000 mg | ORAL_TABLET | Freq: Four times a day (QID) | ORAL | Status: DC | PRN

## 2020-07-19 MED ORDER — SODIUM CHLORIDE 0.9 % IV SOLN
200.0000 mg | Freq: Once | INTRAVENOUS | Status: AC
  Administered 2020-07-19: 200 mg via INTRAVENOUS
  Filled 2020-07-19: qty 200

## 2020-07-19 MED ORDER — ALBUTEROL SULFATE HFA 108 (90 BASE) MCG/ACT IN AERS
2.0000 | INHALATION_SPRAY | RESPIRATORY_TRACT | Status: DC | PRN
  Filled 2020-07-19: qty 6.7

## 2020-07-19 MED ORDER — ONDANSETRON HCL 4 MG/2ML IJ SOLN
4.0000 mg | Freq: Four times a day (QID) | INTRAMUSCULAR | Status: DC | PRN
  Administered 2020-07-19 – 2020-07-21 (×3): 4 mg via INTRAVENOUS
  Filled 2020-07-19 (×3): qty 2

## 2020-07-19 NOTE — ED Triage Notes (Signed)
Pt here for [facility] for 4-5 days.  Having CP to mid chest.  Has had NVD for a week.  No fever. Whole family has covid.  Pt is labored and pursed lip breathing in triage.  Hx copd and chf. Denies weight gain and reports has not been eating r/t GI sx.

## 2020-07-19 NOTE — ED Provider Notes (Signed)
[facility] Emergency Department Provider Note   ____________________________________________   First MD Initiated Contact with Patient 07/19/20 1307     (approximate)  I have reviewed the triage vital signs and the nursing notes.   HISTORY  Chief Complaint Shortness of Breath   HPI Jasmine Larsen is a 54 y.o. female patient reports increasing shortness of breath for for 5 days.  She is short of breath she says after turning over in bed and takes about 40 minutes to catch her breath.  She has had nausea vomiting diarrhea for a week.  She says her whole family has had Covid.  She is not having a fever now but she had a fever several days ago.  She has COPD and CHF as well.  She is also obese.         Past Medical History:  Diagnosis Date  . Asthma   . CHF (congestive heart failure) (HCC)   . COPD (chronic obstructive pulmonary disease) (HCC)   . Multiple gastric ulcers     Patient Active Problem List   Diagnosis Date Noted  . Chest pain 07/13/2016  . SOB (shortness of breath) 07/13/2016  . COPD with asthma (HCC) 07/13/2016  . Morbid obesity (HCC) 07/13/2016    History reviewed. No pertinent surgical history.  Prior to Admission medications   Medication Sig Start Date End Date Taking? Authorizing Provider  furosemide (LASIX) 40 MG tablet Take 1 tablet (40 mg total) by mouth daily. 07/14/16 07/14/17  Altamese Dilling, MD  losartan (COZAAR) 50 MG tablet Take 1 tablet (50 mg total) by mouth daily. 07/15/16   Altamese Dilling, MD  metoprolol succinate (TOPROL XL) 25 MG 24 hr tablet Take 1 tablet (25 mg total) by mouth daily. 07/14/16   Altamese Dilling, MD    Allergies Patient has no known allergies.  History reviewed. No pertinent family history.  Social History Social History   Tobacco Use  . Smoking status: Current Every Day Smoker    Packs/day: 2.00    Types: Cigarettes  . Smokeless tobacco: Never Used  Substance  Use Topics  . Alcohol use: No  . Drug use: No    Review of Systems  Constitutional: Currently no fever/chills Eyes: No visual changes. ENT: No sore throat. Cardiovascular: chest pain. Respiratoryshortness of breath. Gastrointestinal: No abdominal pain.  No nausea, no vomiting.  No diarrhea.  No constipation. Genitourinary: Negative for dysuria. Musculoskeletal: Negative for back pain. Skin: Negative for rash. Neurological: Negative for headaches, focal weakness   ____________________________________________   PHYSICAL EXAM:  VITAL SIGNS: ED Triage Vitals  Enc Vitals Group     BP 07/19/20 0853 (!) 149/80     Pulse Rate 07/19/20 0853 95     Resp 07/19/20 0853 20     Temp 07/19/20 0853 98.6 F (37 C)     Temp Source 07/19/20 0853 Oral     SpO2 07/19/20 0853 95 %     Weight 07/19/20 0853 (!) 360 lb (163.3 kg)     Height 07/19/20 0853 5\' 2"  (1.575 m)     Head Circumference --      Peak Flow --      Pain Score 07/19/20 0859 4     Pain Loc --      Pain Edu? --      Excl. in GC? --     Constitutional: Alert and oriented.  Short of breath Eyes: Conjunctivae are normal.  Head: Atraumatic. Nose: No congestion/rhinnorhea. Mouth/Throat: Mucous  membranes are moist.  Oropharynx non-erythematous. Neck: No stridor. Cardiovascular: Normal rate, regular rhythm. Grossly normal heart sounds.  Good peripheral circulation. Respiratory: Increased respiratory effort.  No retractions. Lungs scattered crackles Gastrointestinal: Soft and nontender. No distention. No abdominal bruits.  Musculoskeletal: No lower extremity tenderness some edema.   Patient reports she is at baseline Neurologic:  Normal speech and language. No gross focal neurologic deficits are appreciated.  Skin:  Skin is warm, dry and intact. No rash noted.   ____________________________________________   LABS (all labs ordered are listed, but only abnormal results are displayed)  Labs Reviewed  CBC - Abnormal;  Notable for the following components:      Result Value   RBC 5.32 (*)    Hemoglobin 15.1 (*)    All other components within normal limits  COMPREHENSIVE METABOLIC PANEL - Abnormal; Notable for the following components:   Potassium 2.9 (*)    Chloride 97 (*)    Glucose, Bld 251 (*)    Calcium 6.6 (*)    Albumin 3.4 (*)    All other components within normal limits  RESPIRATORY PANEL BY RT PCR (FLU A&B, COVID)  LIPASE, BLOOD  BRAIN NATRIURETIC PEPTIDE  URINALYSIS, COMPLETE (UACMP) WITH MICROSCOPIC  PROCALCITONIN  TROPONIN I (HIGH SENSITIVITY)  TROPONIN I (HIGH SENSITIVITY)   ____________________________________________  EKG EKG read interpreted by me shows normal sinus rhythm rate of 96 normal axis nonspecific ST-T wave changes  ____________________________________________  RADIOLOGY Jill Poling, personally viewed and evaluated these images (plain radiographs) as part of my medical decision making, as well as reviewing the written report by the radiologist.  ED MD interpretation: Chest x-ray read by radiology reviewed by me looks like Covid pneumonia.  Official radiology report(s): DG Chest 2 View  Result Date: 07/19/2020 CLINICAL DATA:  Shortness of breath. EXAM: CHEST - 2 VIEW COMPARISON:  July 13, 2016. FINDINGS: The heart size and mediastinal contours are within normal limits. No pneumothorax or pleural effusion is noted. Large bilateral patchy airspace opacities are noted concerning for multifocal pneumonia. The visualized skeletal structures are unremarkable. IMPRESSION: Large bilateral patchy airspace opacities are noted concerning for multifocal pneumonia. Electronically Signed   By: Lupita Raider M.D.   On: 07/19/2020 09:36    ____________________________________________   PROCEDURES  Procedure(s) performed (including Critical Care):  Procedures   ____________________________________________   INITIAL IMPRESSION / ASSESSMENT AND PLAN / ED  COURSE  Patient's labs and Covid test are still pending.  Patient will need admission for hypoxia as her O2 sats go down to 89 with the least exertion.  I will sign the patient out to Dr. Peter Minium          Clinical Course as of Jul 19 1342  Thu Jul 19, 2020  1306 DG Chest 2 View [PM]    Clinical Course User Index [PM] Arnaldo Natal, MD     ____________________________________________   FINAL CLINICAL IMPRESSION(S) / ED DIAGNOSES  Final diagnoses:  Hypoxia  Multifocal pneumonia     ED Discharge Orders    None      *Please note:  Jasmine Larsen was evaluated in Emergency Department on 07/19/2020 for the symptoms described in the history of present illness. She was evaluated in the context of the global COVID-19 pandemic, which necessitated consideration that the patient might be at risk for infection with the SARS-CoV-2 virus that causes COVID-19. Institutional protocols and algorithms that pertain to the evaluation of patients at risk for COVID-19 are  in a state of rapid change based on information released by regulatory bodies including the CDC and federal and state organizations. These policies and algorithms were followed during the patient's care in the ED.  Some ED evaluations and interventions may be delayed as a result of limited staffing during and the pandemic.*   Note:  This document was prepared using Dragon voice recognition software and may include unintentional dictation errors.    Arnaldo Natal, MD 07/19/20 1343

## 2020-07-19 NOTE — H&P (Signed)
History and Physical    Jasmine Larsen BJY:[id number] DOB: 11/01/1965 DOA: 07/19/2020  Referring MD/NP/PA:   PCP: Patient, No Pcp Per   Patient coming from:  The patient is coming from home.  At baseline, pt is independent for most of ADL.        Chief Complaint: Shortness breath  HPI: Jasmine Larsen is a 54 y.o. female with medical history significant of COPD, asthma, multiple gastric ulcer disease, dCHF, tobacco abuse, obesity, who presents with shortness breath.  Patient states that she has been having shortness breath for more than 5 days, which has been progressively worsening.  Patient has dry cough and chest pain.  The chest pain is located in substernal area, tenderness and pressure-like pain, 4 out of 10 severity, nonradiating.  Patient had fever and chills in the first several days, which has resolved currently.  Patient has nausea, vomiting, diarrhea and generalized abdominal pain.  She has vomited several times with nonbilious nonbloody vomiting.  Patient has had more than 10 times of watery diarrhea each day.  No symptoms of UTI or unilateral weakness.  Patient was not vaccinated against Covid19.  ED Course: pt was found to have positive Covid PCR, troponin level 7 --> 6, BNP 49, lipase 23, potassium 2.9, magnesium 2.0, calcium 6.6, phosphorus 1.3, temperature normal, blood pressure 164/111, tachycardia with heart rate 103, RR 28, oxygen desaturation to 88% on room air.  Chest x-ray showed bilateral infiltration.  Patient is admitted to MedSurg bed as inpatient.  Review of Systems:   General: had fevers, chills, no body weight gain, has poor appetite, has fatigue HEENT: no blurry vision, hearing changes or sore throat Respiratory: has dyspnea, coughing, no wheezing CV: has chest pain, no palpitations GI: has nausea, vomiting, abdominal pain, diarrhea, no constipation GU: no dysuria, burning on urination, increased urinary frequency, hematuria  Ext: no leg edema Neuro: no  unilateral weakness, numbness, or tingling, no vision change or hearing loss Skin: no rash, no skin tear. MSK: No muscle spasm, no deformity, no limitation of range of movement in spin Heme: No easy bruising.  Travel history: No recent long distant travel.  Allergy: No Known Allergies  Past Medical History:  Diagnosis Date  . Asthma   . CHF (congestive heart failure) (HCC)   . COPD (chronic obstructive pulmonary disease) (HCC)   . Multiple gastric ulcers     Past Surgical History:  Procedure Laterality Date  . CESAREAN SECTION      Social History:  reports that she has been smoking cigarettes. She has been smoking about 2.00 packs per day. She has never used smokeless tobacco. She reports that she does not drink alcohol and does not use drugs.  Family History:  Family History  Problem Relation Age of Onset  . Diabetes Mellitus II Mother      Prior to Admission medications   Not on File    Physical Exam: Vitals:   07/19/20 1300 07/19/20 1305 07/19/20 1320 07/19/20 1540  BP:  (!) 164/111  124/71  Pulse:  (!) 103 99 95  Resp: (!) 28 (!) 24 19   Temp:      TempSrc:      SpO2: (!) 88% 92% 95% 94%  Weight:      Height:       General: Not in acute distress HEENT:       Eyes: PERRL, EOMI, no scleral icterus.       ENT: No discharge from the ears and nose,  no pharynx injection, no tonsillar enlargement.        Neck: No JVD, no bruit, no mass felt. Heme: No neck lymph node enlargement. Cardiac: S1/S2, RRR, No murmurs, No gallops or rubs. Respiratory: Has coarse breathing sound and fine crakles bilaterally GI: Soft, nondistended, has diffused mild tenderness, no rebound pain, no organomegaly, BS present. GU: No hematuria Ext: No pitting leg edema bilaterally. 1+DP/PT pulse bilaterally. Musculoskeletal: No joint deformities, No joint redness or warmth, no limitation of ROM in spin. Skin: No rashes.  Neuro: Alert, oriented X3, cranial nerves II-XII grossly intact, moves  all extremities normally.  Psych: Patient is not psychotic, no suicidal or hemocidal ideation.  Labs on Admission: I have personally reviewed following labs and imaging studies  CBC: Recent Labs  Lab 07/19/20 0856  WBC 7.5  HGB 15.1*  HCT 45.5  MCV 85.5  PLT 318   Basic Metabolic Panel: Recent Labs  Lab 07/19/20 0856 07/19/20 1534  NA 135  --   K 2.9*  --   CL 97*  --   CO2 25  --   GLUCOSE 251*  --   BUN 9  --   CREATININE 0.83  --   CALCIUM 6.6*  --   MG  --  2.0  PHOS  --  1.3*   GFR: Estimated Creatinine Clearance: 119.4 mL/min (by C-G formula based on SCr of 0.83 mg/dL). Liver Function Tests: Recent Labs  Lab 07/19/20 0856  AST 28  ALT 26  ALKPHOS 116  BILITOT 1.2  PROT 7.8  ALBUMIN 3.4*   Recent Labs  Lab 07/19/20 0856  LIPASE 23   No results for input(s): AMMONIA in the last 168 hours. Coagulation Profile: No results for input(s): INR, PROTIME in the last 168 hours. Cardiac Enzymes: No results for input(s): CKTOTAL, CKMB, CKMBINDEX, TROPONINI in the last 168 hours. BNP (last 3 results) No results for input(s): PROBNP in the last 8760 hours. HbA1C: No results for input(s): HGBA1C in the last 72 hours. CBG: No results for input(s): GLUCAP in the last 168 hours. Lipid Profile: No results for input(s): CHOL, HDL, LDLCALC, TRIG, CHOLHDL, LDLDIRECT in the last 72 hours. Thyroid Function Tests: No results for input(s): TSH, T4TOTAL, FREET4, T3FREE, THYROIDAB in the last 72 hours. Anemia Panel: No results for input(s): VITAMINB12, FOLATE, FERRITIN, TIBC, IRON, RETICCTPCT in the last 72 hours. Urine analysis:    Component Value Date/Time   COLORURINE YELLOW (A) 01/06/2016 1527   APPEARANCEUR CLOUDY (A) 01/06/2016 1527   LABSPEC 1.014 01/06/2016 1527   PHURINE 8.0 01/06/2016 1527   GLUCOSEU NEGATIVE 01/06/2016 1527   HGBUR NEGATIVE 01/06/2016 1527   BILIRUBINUR NEGATIVE 01/06/2016 1527   KETONESUR NEGATIVE 01/06/2016 1527   PROTEINUR NEGATIVE  01/06/2016 1527   NITRITE NEGATIVE 01/06/2016 1527   LEUKOCYTESUR 1+ (A) 01/06/2016 1527   Sepsis Labs: @LABRCNTIP (procalcitonin:4,lacticidven:4) ) Recent Results (from the past 240 hour(s))  Respiratory Panel by RT PCR (Flu A&B, Covid) - Nasopharyngeal Swab     Status: Abnormal   Collection Time: 07/19/20  1:09 PM   Specimen: Nasopharyngeal Swab  Result Value Ref Range Status   SARS Coronavirus 2 by RT PCR POSITIVE (A) NEGATIVE Final    Comment: RESULT CALLED TO, READ BACK BY AND VERIFIED WITH: ZACH REGISTER 07/19/20 1413 KLW (NOTE) SARS-CoV-2 target nucleic acids are DETECTED.  SARS-CoV-2 RNA is generally detectable in upper respiratory specimens  during the acute phase of infection. Positive results are indicative of the presence of the identified virus, but  do not rule out bacterial infection or co-infection with other pathogens not detected by the test. Clinical correlation with patient history and other diagnostic information is necessary to determine patient infection status. The expected result is Negative.  Fact Sheet for Patients:  https://www.moore.com/  Fact Sheet for Healthcare Providers: https://www.young.biz/  This test is not yet approved or cleared by the [place] FDA and  has been authorized for detection and/or diagnosis of SARS-CoV-2 by FDA under an Emergency Use Authorization (EUA).  This EUA will remain in effect (meaning this test can be used ) for the duration of  the COVID-19 declaration under Section 564(b)(1) of the Act, 21 U.S.C. section 360bbb-3(b)(1), unless the authorization is terminated or revoked sooner.      Influenza A by PCR NEGATIVE NEGATIVE Final   Influenza B by PCR NEGATIVE NEGATIVE Final    Comment: (NOTE) The Xpert Xpress SARS-CoV-2/FLU/RSV assay is intended as an aid in  the diagnosis of influenza from Nasopharyngeal swab specimens and  should not be used as a sole basis for treatment.  Nasal washings and  aspirates are unacceptable for Xpert Xpress SARS-CoV-2/FLU/RSV  testing.  Fact Sheet for Patients: https://www.moore.com/  Fact Sheet for Healthcare Providers: https://www.young.biz/  This test is not yet approved or cleared by the [place] FDA and  has been authorized for detection and/or diagnosis of SARS-CoV-2 by  FDA under an Emergency Use Authorization (EUA). This EUA will remain  in effect (meaning this test can be used) for the duration of the  Covid-19 declaration under Section 564(b)(1) of the Act, 21  U.S.C. section 360bbb-3(b)(1), unless the authorization is  terminated or revoked. Performed at [facility], [address], [place], [place] [address]      Radiological Exams on Admission: DG Chest 2 View  Result Date: 07/19/2020 CLINICAL DATA:  Shortness of breath. EXAM: CHEST - 2 VIEW COMPARISON:  July 13, 2016. FINDINGS: The heart size and mediastinal contours are within normal limits. No pneumothorax or pleural effusion is noted. Large bilateral patchy airspace opacities are noted concerning for multifocal pneumonia. The visualized skeletal structures are unremarkable. IMPRESSION: Large bilateral patchy airspace opacities are noted concerning for multifocal pneumonia. Electronically Signed   By: Lupita Raider M.D.   On: 07/19/2020 09:36     EKG: Independently reviewed.  Sinus rhythm, low voltage, no ischemic change.   Assessment/Plan Principal Problem:   Pneumonia due to COVID-19 virus Active Problems:   Chest pain   Chronic diastolic CHF (congestive heart failure) (HCC)   COPD (chronic obstructive pulmonary disease) (HCC)   Hypokalemia   Hypocalcemia   Tobacco abuse   Acute respiratory failure with hypoxia (HCC)   Hypophosphatemia   Nausea, vomiting and diarrhea   Acute respiratory failure with hypoxia due to pneumonia due to COVID-19 virus: Patient has oxygen desaturated to 88%  on room air.  Chest x-ray showed bilateral infiltration.  -will admit to med-surg bed as inpt -Remdesivir per pharm -Solumedrol 80 mg bid -vitamin C, zinc.  -Bronchodilators -PRN Mucinex for cough -f/u Blood culture -D-dimer, BNP,Trop, LFT, CRP, LDH, Procalcitonin, Ferritin, fibinogen, TG, Hep B SAg, HIV ab -Daily CRP, Ferritin, D-dimer, -Will ask the patient to maintain an awake prone position for 16+ hours a day, if possible, with a minimum of 2-3 hours at a time -Will attempt to maintain euvolemia to a net negative fluid status -IF patient deteriorates, will consult PCCM and ID  Chest pain: Troponin negative x2.  Low suspicions of acute coronary artery  syndrome.  Possibly due to COVID-19 pneumonia. -As needed Percocet and morphine for pain -Follow-up CT angiogram to rule out PE  Chronic diastolic CHF (congestive heart failure) (HCC): 2D echo on 07/14/2016 showed EF of 65-70%.  Patient does not have leg edema.  CHF seem to be compensated.  Patient is not taking diuretics at home. -Watch volume status closely  COPD (chronic obstructive pulmonary disease) (HCC) -Bronchodilators  Electrolytes disturbance: Hypokalemia with potassium 2.9, Hypocalcemia with calcium 6.6 and Hypophosphatemia with a phosphorus 1.3 -repleted all -f/u morning lab  Tobacco abuse -Nicotine patch  Nausea, vomiting and diarrhea: This is most likely due to gastroenteritis secondary to COVID-19 infection. -Supportive care -As needed Zofran -Check C. difficile PCR.  If is negative, will start as needed Imodium  DVT ppx: SQ Lovenox Code Status: Full code Family Communication: not done, no family member is at bed side.    Disposition Plan:  Anticipate discharge back to previous environment Consults called:  None Admission status: Med-surg bed as inpt       Status is: Inpatient  Remains inpatient appropriate because:Inpatient level of care appropriate due to severity of illness.  Patient has multiple  comorbidities, now presents with acute respiratory failure with hypoxia due to pneumonia secondary to COVID-19 infection.  Patient also has gastroenteritis due to COVID-19 infection and severe multiple electrolyte disturbance.  Her presentation is highly complicated.  Patient is at high risk of deteriorating.  Need to be treated in hospital for at least 2 days.   Dispo: The patient is from: Home              Anticipated d/c is to: Home              Anticipated d/c date is: 2 days              Patient currently is not medically stable to d/c.          Date of Service 07/19/2020    Lorretta Harp [facility]   If 7PM-7AM, please contact night-coverage [email or web address] 07/19/2020, 5:33 PM

## 2020-07-19 NOTE — Progress Notes (Signed)
Ch called Pt's RN in response to OR for AD. OR stated that Pt does not want ventilator, but wants CPR. Ch explained AD completion not possible owing to witnesses not being able to/not wanting to enter 1C. Attempt will be made to complete and notarize AD when Pt is moved to a regular floor.

## 2020-07-19 NOTE — Progress Notes (Signed)
Remdesivir - Pharmacy Brief Note   O:  ALT: 26 CXR: Large bilateral patchy airspace opacities are noted concerning for multifocal pneumonia. SpO2: 88-95% on room air    A/P:  Remdesivir 200 mg IVPB once followed by 100 mg IVPB daily x 4 days.   Gardner Candle, PharmD, BCPS Clinical Pharmacist 07/19/2020 2:38 PM

## 2020-07-19 NOTE — Progress Notes (Signed)
PHARMACIST - PHYSICIAN COMMUNICATION  CONCERNING:  Enoxaparin (Lovenox) for DVT Prophylaxis    RECOMMENDATION: Patient was prescribed enoxaprin 40mg  q24 hours for VTE prophylaxis.   Filed Weights   07/19/20 0853  Weight: (!) 163.3 kg (360 lb)    Body mass index is 65.84 kg/m.  Estimated Creatinine Clearance: 119.4 mL/min (by C-G formula based on SCr of 0.83 mg/dL).   Based on [facility] policy patient is candidate for enoxaparin 40mg  every 12 hour dosing due to BMI being >40.   DESCRIPTION: Pharmacy has adjusted enoxaparin dose per [facility] policy.  Patient is now receiving enoxaparin 40mg  every 12 hours.    Kinjal Neitzke, PharmD Clinical Pharmacist  07/19/2020 5:07 PM

## 2020-07-19 NOTE — ED Notes (Addendum)
Pt became dyspneic with very labored breathing while ambulating from wheelchair to bed. Pt noted to be hypoxic at 88%. Oxygen saturation returned to normal after resting for several minutes.

## 2020-07-20 LAB — COMPREHENSIVE METABOLIC PANEL
ALT: 21 U/L (ref 0–44)
AST: 19 U/L (ref 15–41)
Albumin: 2.8 g/dL — ABNORMAL LOW (ref 3.5–5.0)
Alkaline Phosphatase: 104 U/L (ref 38–126)
Anion gap: 12 (ref 5–15)
BUN: 13 mg/dL (ref 6–20)
CO2: 22 mmol/L (ref 22–32)
Calcium: 6.5 mg/dL — ABNORMAL LOW (ref 8.9–10.3)
Chloride: 97 mmol/L — ABNORMAL LOW (ref 98–111)
Creatinine, Ser: 0.9 mg/dL (ref 0.44–1.00)
GFR calc non Af Amer: >60 mL/min (ref >60)
Glucose, Bld: 556 mg/dL (ref 70–99)
Potassium: 4 mmol/L (ref 3.5–5.1)
Sodium: 131 mmol/L — ABNORMAL LOW (ref 135–145)
Total Bilirubin: 0.8 mg/dL (ref 0.3–1.2)
Total Protein: 6.9 g/dL (ref 6.5–8.1)

## 2020-07-20 LAB — CBC WITH DIFFERENTIAL/PLATELET
Abs Immature Granulocytes: 0.1 10*3/uL — ABNORMAL HIGH (ref 0.00–0.07)
Basophils Absolute: 0 10*3/uL (ref 0.0–0.1)
Basophils Relative: 0 %
Eosinophils Absolute: 0 10*3/uL (ref 0.0–0.5)
Eosinophils Relative: 0 %
HCT: 39.3 % (ref 36.0–46.0)
Hemoglobin: 13.1 g/dL (ref 12.0–15.0)
Immature Granulocytes: 1 %
Lymphocytes Relative: 11 %
Lymphs Abs: 1.1 10*3/uL (ref 0.7–4.0)
MCH: 28.9 pg (ref 26.0–34.0)
MCHC: 33.3 g/dL (ref 30.0–36.0)
MCV: 86.6 fL (ref 80.0–100.0)
Monocytes Absolute: 0.1 10*3/uL (ref 0.1–1.0)
Monocytes Relative: 2 %
Neutro Abs: 8.2 10*3/uL — ABNORMAL HIGH (ref 1.7–7.7)
Neutrophils Relative %: 86 %
Platelets: 322 10*3/uL (ref 150–400)
RBC: 4.54 MIL/uL (ref 3.87–5.11)
RDW: 13.8 % (ref 11.5–15.5)
Smear Review: NORMAL
WBC: 9.5 10*3/uL (ref 4.0–10.5)
nRBC: 0 % (ref 0.0–0.2)

## 2020-07-20 LAB — GLUCOSE, CAPILLARY
Glucose-Capillary: 513 mg/dL (ref 70–99)
Glucose-Capillary: 440 mg/dL — ABNORMAL HIGH (ref 70–99)
Glucose-Capillary: 402 mg/dL — ABNORMAL HIGH (ref 70–99)
Glucose-Capillary: 399 mg/dL — ABNORMAL HIGH (ref 70–99)
Glucose-Capillary: 359 mg/dL — ABNORMAL HIGH (ref 70–99)

## 2020-07-20 LAB — HEMOGLOBIN A1C
Hgb A1c MFr Bld: 9 % — ABNORMAL HIGH (ref 4.8–5.6)
Mean Plasma Glucose: 211.6 mg/dL

## 2020-07-20 LAB — HIV ANTIBODY (ROUTINE TESTING W REFLEX): HIV Screen 4th Generation wRfx: NONREACTIVE

## 2020-07-20 LAB — MAGNESIUM: Magnesium: 2.1 mg/dL (ref 1.7–2.4)

## 2020-07-20 LAB — PHOSPHORUS: Phosphorus: 1.2 mg/dL — ABNORMAL LOW (ref 2.5–4.6)

## 2020-07-20 LAB — C-REACTIVE PROTEIN: CRP: 9.6 mg/dL — ABNORMAL HIGH (ref <1.0)

## 2020-07-20 LAB — FERRITIN: Ferritin: 540 ng/mL — ABNORMAL HIGH (ref 11–307)

## 2020-07-20 LAB — FIBRIN DERIVATIVES D-DIMER (ARMC ONLY): Fibrin derivatives D-dimer (ARMC): 769.91 ng/mL (FEU) — ABNORMAL HIGH (ref 0.00–499.00)

## 2020-07-20 MED ORDER — INSULIN GLARGINE 100 UNIT/ML ~~LOC~~ SOLN
20.0000 [IU] | Freq: Every day | SUBCUTANEOUS | Status: DC
  Administered 2020-07-20 – 2020-07-21 (×2): 20 [IU] via SUBCUTANEOUS
  Filled 2020-07-20 (×3): qty 0.2

## 2020-07-20 MED ORDER — INSULIN ASPART 100 UNIT/ML ~~LOC~~ SOLN: 20.0000 [IU] | Freq: Once | SUBCUTANEOUS | Status: DC

## 2020-07-20 MED ORDER — METHYLPREDNISOLONE SODIUM SUCC 40 MG IJ SOLR
40.0000 mg | Freq: Two times a day (BID) | INTRAMUSCULAR | Status: DC
  Administered 2020-07-20 – 2020-07-21 (×2): 40 mg via INTRAVENOUS
  Filled 2020-07-20 (×2): qty 1

## 2020-07-20 MED ORDER — FUROSEMIDE 10 MG/ML IJ SOLN
40.0000 mg | Freq: Once | INTRAMUSCULAR | Status: AC
  Administered 2020-07-20: 40 mg via INTRAVENOUS
  Filled 2020-07-20: qty 4

## 2020-07-20 MED ORDER — INSULIN ASPART 100 UNIT/ML ~~LOC~~ SOLN
0.0000 [IU] | Freq: Three times a day (TID) | SUBCUTANEOUS | Status: DC
  Administered 2020-07-20 – 2020-07-21 (×5): 20 [IU] via SUBCUTANEOUS
  Administered 2020-07-21: 17:00:00 15 [IU] via SUBCUTANEOUS
  Administered 2020-07-21: 13:00:00 20 [IU] via SUBCUTANEOUS
  Administered 2020-07-22: 09:00:00 15 [IU] via SUBCUTANEOUS
  Administered 2020-07-22: 20 [IU] via SUBCUTANEOUS
  Filled 2020-07-20 (×9): qty 1

## 2020-07-20 MED ORDER — INSULIN GLARGINE 100 UNIT/ML ~~LOC~~ SOLN
20.0000 [IU] | Freq: Every day | SUBCUTANEOUS | Status: DC
  Filled 2020-07-20: qty 0.2

## 2020-07-20 MED ORDER — POTASSIUM PHOSPHATES 15 MMOLE/5ML IV SOLN
45.0000 mmol | Freq: Once | INTRAVENOUS | Status: AC
  Administered 2020-07-20: 16:00:00 45 mmol via INTRAVENOUS
  Filled 2020-07-20: qty 15

## 2020-07-20 MED ORDER — INSULIN ASPART 100 UNIT/ML ~~LOC~~ SOLN
25.0000 [IU] | Freq: Once | SUBCUTANEOUS | Status: AC
  Administered 2020-07-20: 25 [IU] via SUBCUTANEOUS
  Filled 2020-07-20: qty 1

## 2020-07-20 MED ORDER — MELATONIN 5 MG PO TABS
5.0000 mg | ORAL_TABLET | Freq: Every day | ORAL | Status: DC
  Administered 2020-07-20 – 2020-07-21 (×3): 5 mg via ORAL
  Filled 2020-07-20 (×5): qty 1

## 2020-07-20 NOTE — TOC Initial Note (Signed)
Transition of Care [facility]) - Initial/Assessment Note    Patient Details  Name: Jasmine Larsen MRN: [id number] Date of Birth: 01/22/1966  Transition of [facility]) CM/SW Contact:    Allayne Butcher, RN Phone Number: 07/20/2020, 2:56 PM  Clinical Narrative:                 Patient admitted to the hospital with COVID pneumonia, history of CHF and COPD not on chronic O2.  RNCM was able to speak with patient via phone.  Patient is from home where she is living with her daughter temporarily.  Patient reports that when well she is independent but she does not drive.  Daughter provides transportation.  Patient does not have a PCP or insurance.  Referral made to [facility].  If patient discharges tomorrow she is aware of $4 meds at [facility] and she thinks that she can borrow $8 for them.  Application for [facility] printed out on the unit for RN to take to patient, info also emailed to patient at [email or web address].    Patient did not want home health services set up but she did want a bariatric rolling walker and bariatric 3 in 1, which can also be used as a shower chair.  [employer] accepted referral for charity equipment and will deliver before patient discharged.   Plan for discharge tomorrow.   Expected Discharge Plan: Home/Self Care Barriers to Discharge: Continued Medical Work up   Patient Goals and CMS Choice        Expected Discharge Plan and Services Expected Discharge Plan: Home/Self Care       Living arrangements for the past 2 months: Single Family Home                 DME Arranged: Walker rolling, 3-N-1 (Bariatric Walker and 3 in 1) DME Agency: [facility] Date DME Agency Contacted: 07/20/20 Time DME Agency Contacted: 1455 Representative spoke with at DME Agency: Oletha Cruel HH Arranged: Patient Refused HH          Prior Living Arrangements/Services Living arrangements for the past 2 months: Single Family Home Lives  with:: Adult Children Patient language and need for interpreter reviewed:: Yes Do you feel safe going back to the place where you live?: Yes      Need for Family Participation in Patient Care: Yes (Comment) (COVID, CHF) Care giver support system in place?: Yes (comment) Current home services: DME (cane) Criminal Activity/Legal Involvement Pertinent to Current Situation/Hospitalization: No - Comment as needed  Activities of Daily Living Home Assistive Devices/Equipment: Cane (specify quad or straight) ADL Screening (condition at time of admission) Patient's cognitive ability adequate to safely complete daily activities?: Yes Is the patient deaf or have difficulty hearing?: No Does the patient have difficulty seeing, even when wearing glasses/contacts?: No Does the patient have difficulty concentrating, remembering, or making decisions?: No Patient able to express need for assistance with ADLs?: Yes Does the patient have difficulty dressing or bathing?: Yes Independently performs ADLs?: No Communication: Independent Dressing (OT): Needs assistance Is this a change from baseline?: Pre-admission baseline Grooming: Needs assistance Is this a change from baseline?: Pre-admission baseline Feeding: Independent Bathing: Needs assistance Is this a change from baseline?: Pre-admission baseline Toileting: Needs assistance Is this a change from baseline?: Pre-admission baseline In/Out Bed: Needs assistance Is this a change from baseline?: Pre-admission baseline Walks in Home: Needs assistance Is this a change from baseline?: Pre-admission baseline Does the patient have difficulty walking  or climbing stairs?: Yes Weakness of Legs: Both Weakness of Arms/Hands: Both  Permission Sought/Granted Permission sought to share information with : Case Manager, Family Supports, Other (comment) Permission granted to share information with : Yes, Verbal Permission Granted     Permission granted to share  info w AGENCY: [employer]  Permission granted to share info w Relationship: daughter     Emotional Assessment   Attitude/Demeanor/Rapport: Engaged Affect (typically observed): Accepting Orientation: : Oriented to Self, Oriented to Place, Oriented to  Time, Oriented to Situation Alcohol / Substance Use: Not Applicable Psych Involvement: No (comment)  Admission diagnosis:  Hypoxia [R09.02] Multifocal pneumonia [J18.9] Pneumonia due to COVID-19 virus [U07.1, J12.82] Patient Active Problem List   Diagnosis Date Noted  . Pneumonia due to COVID-19 virus 07/19/2020  . Chronic diastolic CHF (congestive heart failure) (HCC) 07/19/2020  . COPD (chronic obstructive pulmonary disease) (HCC) 07/19/2020  . Hypokalemia 07/19/2020  . Hypocalcemia 07/19/2020  . Tobacco abuse 07/19/2020  . Acute respiratory failure with hypoxia (HCC) 07/19/2020  . Hypophosphatemia 07/19/2020  . Nausea, vomiting and diarrhea 07/19/2020  . Chest pain 07/13/2016  . SOB (shortness of breath) 07/13/2016  . COPD with asthma (HCC) 07/13/2016  . Morbid obesity (HCC) 07/13/2016   PCP:  Patient, No Pcp Per Pharmacy:   [facility] [address]), [place] - [address] [place]) [place] [address] Phone: [phone] Fax: [phone]     Social Determinants of Health (SDOH) Interventions    Readmission Risk Interventions No flowsheet data found.

## 2020-07-20 NOTE — Progress Notes (Signed)
OT Cancellation Note  Patient Details Name: Jasmine Larsen MRN: [id number] DOB: 1966/09/19   Cancelled Treatment:    Reason Eval/Treat Not Completed: Fatigue/lethargy limiting ability to participate. OT order received and chart reviewed. Upon arrival, pt states she just returned to bed. OT offers to follow up in an hour and pt declines stating too fatigued, requesting to be seen next date. Will follow acutely and initiates services as able.   Kathie Dike, M.S. OTR/L  07/20/20, 3:39 PM  ascom [phone]

## 2020-07-20 NOTE — Progress Notes (Signed)
Inpatient Diabetes Program Recommendations  AACE/ADA: New Consensus Statement on Inpatient Glycemic Control (2015)  Target Ranges:  Prepandial:   less than 140 mg/dL      Peak postprandial:   less than 180 mg/dL (1-2 hours)      Critically ill patients:  140 - 180 mg/dL    Results for DORETTA, REMMERT (MRN [id number]) as of 07/20/2020 08:09  Ref. Range 07/20/2020 06:52  Glucose-Capillary Latest Ref Range: 70 - 99 mg/dL  779 mg Solumedrol given 3:33pm on 10/07 513 (HH)  25 units NOVOLOG     Admit with: Acute respiratory failure with hypoxia due to pneumonia due to COVID-19 virus  History: COPD, CHF  No History of Diabetes noted in H&P  Current Orders: Lantus 20 units Daily      Novolog Resistant Correction Scale/ SSI (0-20 units) TID AC + HS     MD- Note pt does not have previous history of diabetes  Glucose levels elevated--Getting Solumedrol 80 mg BID  SQ Insulin started this AM  Hemoglobin A1c in process     --Will follow patient during hospitalization--  Ambrose Finland RN, MSN, CDE Diabetes Coordinator Inpatient Glycemic Control Team Team Pager: [phone] (8a-5p)

## 2020-07-20 NOTE — Plan of Care (Signed)

## 2020-07-20 NOTE — Progress Notes (Signed)
PROGRESS NOTE    Jasmine Larsen  TOI:[id number] DOB: 07-30-1966 DOA: 07/19/2020 PCP: Patient, No Pcp Per   Brief Narrative:  Patient states that she has been having shortness breath for more than 5 days, which has been progressively worsening.  Patient has dry cough and chest pain.  Patient had fever and chills in the first several days, which has resolved currently.  Patient has nausea, vomiting, diarrhea and generalized abdominal pain.  She has vomited several times with nonbilious nonbloody vomiting.  Patient has had more than 10 times of watery diarrhea each day.  Patient was not vaccinated against Covid19.  COVID-19 PCR positive in ED  Patient weaned to room air however remains symptomatic.  Still endorsing some shortness of breath.  Sugars very uncontrolled.  Patient has no history of diabetes but considering degree of hyperglycemia strongly suspect underlying diabetes   Assessment & Plan:   Principal Problem:   Pneumonia due to COVID-19 virus Active Problems:   Chest pain   Chronic diastolic CHF (congestive heart failure) (HCC)   COPD (chronic obstructive pulmonary disease) (HCC)   Hypokalemia   Hypocalcemia   Tobacco abuse   Acute respiratory failure with hypoxia (HCC)   Hypophosphatemia   Nausea, vomiting and diarrhea  Acute respiratory failure with hypoxia due to pneumonia due to COVID-19 virus:  Patient has oxygen desaturated to 88% on room air.   Chest x-ray showed bilateral infiltration. Weaned to room air at this time Still symptomatic.  Chest pain, cough, shortness of breath Plan: Continue remdesivir per pharmacy consult, dose 2/5 Continue IV steroids for now.  Decrease dose to 40 mg IV twice daily Prone as tolerated.  Patient may not be able to tolerate considering body habitus Stress incentive spirometry use Follow inflammatory markers Lasix 40 mg IV x1.  Reassess daily for diuretic need and tolerance  Severe hyperglycemia Strongly suspect underlying  diabetes Hemoglobin A1c pending Diabetes coordinator following Start Lantus 20 units daily Resistant sliding scale Follow-up hemoglobin A1c Carb modified diet Reduce steroid dose as above  Chest pain  Troponin negative x2.   Low suspicions of acute coronary artery syndrome.   Likely pleuritic chest pain in setting of COVID-19 pneumonia CT angiogram negative for PE Plan: As needed Percocet  Chronic diastolic CHF (congestive heart failure) (HCC)  2D echo on 07/14/2016 showed EF of 65-70%.   Patient does not have leg edema.   CHF seem to be compensated.  Patient is not taking diuretics at home. -Watch volume status closely  COPD (chronic obstructive pulmonary disease) (HCC) -Bronchodilators  Electrolytes disturbance  Hypokalemia with potassium 2.9,  Hypocalcemia with calcium 6.6  Hypophosphatemia with a phosphorus 1.3 -repleted all -Corrected on follow-up labs  Tobacco abuse -Nicotine patch.  Patient refused -States that she quit smoking  Nausea, vomiting and diarrhea  This is most likely due to gastroenteritis secondary to COVID-19  Plan: -Supportive care -As needed Zofran -Check C. difficile PCR.  If is negative, will start as needed Imodium  Morbid obesity BMI 65.84 This complicates overall care and prognosis   DVT prophylaxis: Lovenox Code Status: Full Family Communication: None today Disposition Plan: Status is: Inpatient  Remains inpatient appropriate because:Inpatient level of care appropriate due to severity of illness   Dispo: The patient is from: Home              Anticipated d/c is to: Home              Anticipated d/c date is: 1 day  Patient currently is not medically stable to d/c.  Patient has been weaned to room air however remains symptomatic secondary to Covid infection.  Patient also has marked hyperglycemia.  Strongly suspect underlying diabetes.  Hemoglobin A1c pending.  Anticipate 24 hours of additional inpatient  treatment and monitoring prior to disposition planning.  Therapy evaluations ordered.  Tentative plan discharge home on 07/21/2020 if sugars better controlled and patient remained stable on room air.   Consultants:   None  Procedures:   None  Antimicrobials:  Remdesivir   Subjective: Seen and examined.  Endorses pleuritic chest pain and shortness of breath  Objective: Vitals:   07/19/20 2047 07/20/20 0046 07/20/20 0441 07/20/20 0811  BP: (!) 148/65 138/75 (!) 111/92 116/73  Pulse: (!) 103 98 94 90  Resp: 18 (!) 22 (!) 22 16  Temp: 98.3 F (36.8 C) 100.1 F (37.8 C) 99.1 F (37.3 C) 97.7 F (36.5 C)  TempSrc: Oral Axillary Axillary Oral  SpO2: 92% 97% 90% 93%  Weight:      Height:        Intake/Output Summary (Last 24 hours) at 07/20/2020 1114 Last data filed at 07/19/2020 1900 Gross per 24 hour  Intake 324.82 ml  Output 350 ml  Net -25.18 ml   Filed Weights   07/19/20 0853  Weight: (!) 163.3 kg    Examination:  General exam: No acute distress.  Appears fatigued Respiratory system: Diffusely decreased lung sounds.  Poor respiratory effort.  Bibasilar crackles.  Normal work of breathing.  Room air Cardiovascular system: Distant heart sounds.  No appreciable murmurs.  No pedal edema Gastrointestinal system: Obese, nontender, nondistended, normal bowel sounds  Central nervous system: Alert and oriented. No focal neurological deficits. Extremities: Symmetric 5 x 5 power. Skin: No rashes, lesions or ulcers Psychiatry: Judgement and insight appear normal. Mood & affect appropriate.     Data Reviewed: I have personally reviewed following labs and imaging studies  CBC: Recent Labs  Lab 07/19/20 0856 07/20/20 0511  WBC 7.5 9.5  NEUTROABS  --  8.2*  HGB 15.1* 13.1  HCT 45.5 39.3  MCV 85.5 86.6  PLT 318 322   Basic Metabolic Panel: Recent Labs  Lab 07/19/20 0856 07/19/20 1534 07/20/20 0511  NA 135  --  131*  K 2.9*  --  4.0  CL 97*  --  97*  CO2 25   --  22  GLUCOSE 251*  --  556*  BUN 9  --  13  CREATININE 0.83  --  0.90  CALCIUM 6.6*  --  6.5*  MG  --  2.0 2.1  PHOS  --  1.3* 1.2*   GFR: Estimated Creatinine Clearance: 110.1 mL/min (by C-G formula based on SCr of 0.9 mg/dL). Liver Function Tests: Recent Labs  Lab 07/19/20 0856 07/20/20 0511  AST 28 19  ALT 26 21  ALKPHOS 116 104  BILITOT 1.2 0.8  PROT 7.8 6.9  ALBUMIN 3.4* 2.8*   Recent Labs  Lab 07/19/20 0856  LIPASE 23   No results for input(s): AMMONIA in the last 168 hours. Coagulation Profile: No results for input(s): INR, PROTIME in the last 168 hours. Cardiac Enzymes: No results for input(s): CKTOTAL, CKMB, CKMBINDEX, TROPONINI in the last 168 hours. BNP (last 3 results) No results for input(s): PROBNP in the last 8760 hours. HbA1C: Recent Labs    07/20/20 0511  HGBA1C 9.0*   CBG: Recent Labs  Lab 07/20/20 0652 07/20/20 0809  GLUCAP 513* 440*   Lipid  Profile: Recent Labs    07/19/20 1902  TRIG 167*   Thyroid Function Tests: No results for input(s): TSH, T4TOTAL, FREET4, T3FREE, THYROIDAB in the last 72 hours. Anemia Panel: Recent Labs    07/19/20 1902 07/20/20 0511  FERRITIN 567* 540*   Sepsis Labs: Recent Labs  Lab 07/19/20 1534  PROCALCITON <0.10    Recent Results (from the past 240 hour(s))  Respiratory Panel by RT PCR (Flu A&B, Covid) - Nasopharyngeal Swab     Status: Abnormal   Collection Time: 07/19/20  1:09 PM   Specimen: Nasopharyngeal Swab  Result Value Ref Range Status   SARS Coronavirus 2 by RT PCR POSITIVE (A) NEGATIVE Final    Comment: RESULT CALLED TO, READ BACK BY AND VERIFIED WITH: ZACH REGISTER 07/19/20 1413 KLW (NOTE) SARS-CoV-2 target nucleic acids are DETECTED.  SARS-CoV-2 RNA is generally detectable in upper respiratory specimens  during the acute phase of infection. Positive results are indicative of the presence of the identified virus, but do not rule out bacterial infection or co-infection with  other pathogens not detected by the test. Clinical correlation with patient history and other diagnostic information is necessary to determine patient infection status. The expected result is Negative.  Fact Sheet for Patients:  https://www.moore.com/  Fact Sheet for Healthcare Providers: https://www.young.biz/  This test is not yet approved or cleared by the [place] FDA and  has been authorized for detection and/or diagnosis of SARS-CoV-2 by FDA under an Emergency Use Authorization (EUA).  This EUA will remain in effect (meaning this test can be used ) for the duration of  the COVID-19 declaration under Section 564(b)(1) of the Act, 21 U.S.C. section 360bbb-3(b)(1), unless the authorization is terminated or revoked sooner.      Influenza A by PCR NEGATIVE NEGATIVE Final   Influenza B by PCR NEGATIVE NEGATIVE Final    Comment: (NOTE) The Xpert Xpress SARS-CoV-2/FLU/RSV assay is intended as an aid in  the diagnosis of influenza from Nasopharyngeal swab specimens and  should not be used as a sole basis for treatment. Nasal washings and  aspirates are unacceptable for Xpert Xpress SARS-CoV-2/FLU/RSV  testing.  Fact Sheet for Patients: https://www.moore.com/  Fact Sheet for Healthcare Providers: https://www.young.biz/  This test is not yet approved or cleared by the [place] FDA and  has been authorized for detection and/or diagnosis of SARS-CoV-2 by  FDA under an Emergency Use Authorization (EUA). This EUA will remain  in effect (meaning this test can be used) for the duration of the  Covid-19 declaration under Section 564(b)(1) of the Act, 21  U.S.C. section 360bbb-3(b)(1), unless the authorization is  terminated or revoked. Performed at [facility], [address] [place], [place] [address]   Culture, sputum-assessment     Status: None   Collection Time: 07/19/20  6:05 PM    Specimen: Sputum  Result Value Ref Range Status   Specimen Description SPUTUM  Final   Special Requests NONE  Final   Sputum evaluation   Final    THIS SPECIMEN IS ACCEPTABLE FOR SPUTUM CULTURE Performed at [facility], [address] [place], [place] [address]    Report Status 07/19/2020 FINAL  Final  Culture, respiratory     Status: None (Preliminary result)   Collection Time: 07/19/20  6:05 PM   Specimen: SPU  Result Value Ref Range Status   Specimen Description   Final    SPUTUM Performed at [facility], [address], [place], [place] [address]  Special Requests   Final    NONE Reflexed from [id number] Performed at [facility], [address] [place], [place] [address]    Gram Stain   Final    FEW WBC PRESENT,BOTH PMN AND MONONUCLEAR MODERATE GRAM NEGATIVE RODS RARE GRAM POSITIVE COCCI IN PAIRS Performed at [facility], [address] [place], [place] [address]    Culture PENDING  Incomplete   Report Status PENDING  Incomplete  Culture, blood (Routine X 2) w Reflex to ID Panel     Status: None (Preliminary result)   Collection Time: 07/19/20  7:15 PM   Specimen: BLOOD  Result Value Ref Range Status   Specimen Description BLOOD LEFT ANTECUBITAL  Final   Special Requests   Final    BOTTLES DRAWN AEROBIC AND ANAEROBIC Blood Culture adequate volume   Culture   Final    NO GROWTH < 12 HOURS Performed at [facility], [address] [place], [place] [address]    Report Status PENDING  Incomplete  Culture, blood (Routine X 2) w Reflex to ID Panel     Status: None (Preliminary result)   Collection Time: 07/19/20  7:16 PM   Specimen: BLOOD  Result Value Ref Range Status   Specimen Description BLOOD BLOOD LEFT HAND  Final   Special Requests   Final    BOTTLES DRAWN AEROBIC AND ANAEROBIC Blood Culture adequate volume   Culture   Final    NO GROWTH < 12 HOURS Performed at [facility], [address]  [place], [place] [address]    Report Status PENDING  Incomplete  C Difficile Quick Screen w PCR reflex     Status: None   Collection Time: 07/19/20  7:36 PM   Specimen: STOOL  Result Value Ref Range Status   C Diff antigen NEGATIVE NEGATIVE Final   C Diff toxin NEGATIVE NEGATIVE Final   C Diff interpretation No C. difficile detected.  Final    Comment: Performed at [facility], [address] [place], [place] [address]         Radiology Studies: DG Chest 2 View  Result Date: 07/19/2020 CLINICAL DATA:  Shortness of breath. EXAM: CHEST - 2 VIEW COMPARISON:  July 13, 2016. FINDINGS: The heart size and mediastinal contours are within normal limits. No pneumothorax or pleural effusion is noted. Large bilateral patchy airspace opacities are noted concerning for multifocal pneumonia. The visualized skeletal structures are unremarkable. IMPRESSION: Large bilateral patchy airspace opacities are noted concerning for multifocal pneumonia. Electronically Signed   By: Lupita RaiderJames  Green Jr M.D.   On: 07/19/2020 09:36   CT ANGIO CHEST PE W OR WO CONTRAST  Result Date: 07/19/2020 CLINICAL DATA:  Hypoxia.  COVID positive.  Elevated D-dimer. EXAM: CT ANGIOGRAPHY CHEST WITH CONTRAST TECHNIQUE: Multidetector CT imaging of the chest was performed using the standard protocol during bolus administration of intravenous contrast. Multiplanar CT image reconstructions and MIPs were obtained to evaluate the vascular anatomy. CONTRAST:  75mL OMNIPAQUE IOHEXOL 350 MG/ML SOLN COMPARISON:  None. FINDINGS: Cardiovascular: Evaluation for acute pulmonary emboli is severely limited by respiratory motion artifact and streak artifact through the chest secondary to the patient's body habitus. Given the significant limitations, no large centrally located pulmonary embolism was detected. Detection of smaller pulmonary emboli is not possible on this study. There is cardiomegaly. There is no significant pericardial effusion. No  evidence for thoracic aortic dissection or aneurysm. Mediastinum/Nodes: --mild mediastinal adenopathy is noted --there is mild hilar adenopathy. --  No axillary lymphadenopathy. -- No supraclavicular lymphadenopathy. -- Normal thyroid gland where visualized. -  Unremarkable esophagus. Lungs/Pleura: There are diffuse bilateral ground-glass airspace opacities. There is no pneumothorax. No large pleural effusion. No significant abnormality involving the trachea. Upper Abdomen: Contrast bolus timing is not optimized for evaluation of the abdominal organs. There is hepatic steatosis. Musculoskeletal: No chest wall abnormality. No bony spinal canal stenosis. Review of the MIP images confirms the above findings. IMPRESSION: 1. Very limited study as detailed above. Given these limitations, no acute pulmonary embolism was detected. 2. Diffuse bilateral ground-glass airspace opacities, consistent with the patient's history of viral pneumonia. 3. Cardiomegaly. 4. Mild mediastinal and hilar adenopathy, likely reactive. 5. Hepatic steatosis. Electronically Signed   By: Katherine Mantle M.D.   On: 07/19/2020 19:13        Scheduled Meds: . vitamin C  500 mg Oral Daily  . enoxaparin (LOVENOX) injection  40 mg Subcutaneous Q12H  . influenza vac split quadrivalent PF  0.5 mL Intramuscular Tomorrow-1000  . insulin aspart  0-20 Units Subcutaneous TID AC & HS  . insulin glargine  20 Units Subcutaneous Daily  . ipratropium  2 puff Inhalation Q4H  . melatonin  5 mg Oral QHS  . methylPREDNISolone (SOLU-MEDROL) injection  80 mg Intravenous Q12H  . pneumococcal 23 valent vaccine  0.5 mL Intramuscular Tomorrow-1000  . zinc sulfate  220 mg Oral Daily   Continuous Infusions: . remdesivir 100 mg in NS 100 mL 100 mg (07/20/20 0837)     LOS: 1 day    Time spent: 25 minutes    Tresa Moore, MD [facility] Pager [phone]  If 7PM-7AM, please contact night-coverage 07/20/2020, 11:14 AM

## 2020-07-20 NOTE — Evaluation (Signed)
Physical Therapy Evaluation Patient Details Name: Jasmine Larsen MRN: [id number] DOB: 1966/04/26 Today's Date: 07/20/2020   History of Present Illness  Pt is a 54yo F presenting to the ED with c/o SOB, substernal chest pain, and dry cough for 5 days with associated nausea, vomiting, and diarrhea. Pt dx with PNA secondary to COVID. Significant PMH includes: COPD, chronic diastolic CHF, chest pain, hypokalemia, acute respiratory failure with hypoxia, and nausea/vomiting/diarrhea. Imaging significant for multifocal PNA.    Clinical Impression  Pt is a 54 year old F admitted to hospital on 07/19/20 for COVID PNA. At baseline, pt is independent with ADL's, uses quad cane for household ambulation, and needs assistance from daughter with IADL's and transportation; pt requires increased time/effort to perform ADL's due to leg pain and SOB. Pt presents with generalized weakness, decreased activity tolerance, and decreased gross balance resulting in impaired functional mobility. Due to deficits, pt required supervision for bed mobility, CGA-supervision for transfers, and CGA-supervision for short distance ambulation. Pt with improved overall gait and balance when using RW. Pt educated regarding benefits of using RW for energy conservation and to improve balance; she verbalized understanding. Deficits limit the pt's ability to safely and independently perform ADL's, transfer, and ambulate. Pt SpO2 dropped to 88% after ambulation with pt RPE of 10/10 indicating "max effort". Pt able to recover SpO2 to 90% with cues for pursed lip breathing; RN notified of vitals. Pt will benefit from acute skilled PT services to address deficits for return to baseline function. At this time, PT recommends DC home with HHPT and 24/7 care to address deficits and improve overall safety with functional mobility. Pt will also benefit from RW, BSC, and shower chair for energy conservation and safety with functional mobility. Pt with concerns  of HHPT at DC to due aggressive dog; PT noted that pt may not be appropriate for OPPT due to COVID. Pt concerned about obtaining DME and PT at DC due to pt having no insurance and limited funds.     Follow Up Recommendations Home health PT;Supervision/Assistance - 24 hour;Outpatient PT (HHPT vs. OPPT due to COVID)    Equipment Recommendations  3in1 (PT);Rolling walker with 5" wheels;Other (comment) (shower chair)    Recommendations for Other Services       Precautions / Restrictions Precautions Precautions: Fall Precaution Comments: Air/Con (COVID) Restrictions Weight Bearing Restrictions: No      Mobility  Bed Mobility Overal bed mobility: Needs Assistance Bed Mobility: Supine to Sit     Supine to sit: Supervision     General bed mobility comments: Supervision for safety to perform supine>sit transfer with use of BUE for support. Increased time/effort to perform mobility.  Transfers Overall transfer level: Needs assistance Equipment used: None;Rolling walker (2 wheeled) Transfers: Sit to/from Stand Sit to Stand: Min guard;Supervision         General transfer comment: CGA for safety to perform STS from EOB without AD; pt demonstrated wide BOS with initial standing. Supervision for safety to stand from [facility] with RW.  Ambulation/Gait Ambulation/Gait assistance: Supervision;Min guard Gait Distance (Feet): 30 Feet Assistive device: None;IV Pole;Rolling walker (2 wheeled)       General Gait Details: Pt required CGA for safety to ambulate without AD and intermittent use of IV pole. Pt demonstrated shuffled gait pattern with increased lateral trunk deviation and reaching for wall/furniture for steadying. Supervision for safety to ambulate with RW; pt demonstrated shuffled gait pattern with improved balance/stability. Pt with RPE of 10/10 indicating "max effort" for mobility.  Vitals WNL with exception of SpO2 dropping to 88% on RA.  Stairs            Wheelchair  Mobility    Modified Rankin (Stroke Patients Only)       Balance Overall balance assessment: Needs assistance Sitting-balance support: Feet supported;No upper extremity supported Sitting balance-Leahy Scale: Fair Sitting balance - Comments: Fair seated balance at EOB and on BSC with bil feet supported.   Standing balance support: During functional activity;Bilateral upper extremity supported   Standing balance comment: Poor standing balance without AD; fair standing balance with BUE support on RW.                             Pertinent Vitals/Pain Pain Assessment: 0-10 Pain Score: 10-Worst pain ever Pain Location: chest pain Pain Descriptors / Indicators: Pressure Pain Intervention(s): Limited activity within patient's tolerance;Monitored during session;Repositioned    Home Living Family/patient expects to be discharged to:: Private residence Living Arrangements: Children;Other relatives Available Help at Discharge: Family;Available 24 hours/day Type of Home: House Home Access: Stairs to enter Entrance Stairs-Rails:  (has column at top of steps she holds onto) [employer] of Steps: 2 Home Layout: One level Home Equipment: Cane - quad      Prior Function Level of Independence: Needs assistance   Gait / Transfers Assistance Needed: Mod I with quad cane; limited household ambulation  ADL's / Homemaking Assistance Needed: Mod I with quad cane; increased difficulty due to leg pain/SOB. Daughter assists with IADL's and transportation.        Hand Dominance        Extremity/Trunk Assessment   Upper Extremity Assessment Upper Extremity Assessment: Generalized weakness    Lower Extremity Assessment Lower Extremity Assessment: Generalized weakness    Cervical / Trunk Assessment Cervical / Trunk Assessment: Kyphotic  Communication   Communication: No difficulties  Cognition Arousal/Alertness: Awake/alert Behavior During Therapy: WFL for  tasks assessed/performed Overall Cognitive Status: Within Functional Limits for tasks assessed                                 General Comments: A&O x4      General Comments      Exercises Other Exercises Other Exercises: Pt educated regarding activity modification, pursed lip breathing, and use of RW for energy conservation and balance; she verbalized understanding but expressed concern for obtaining DME due to no insurance and no funds.   Assessment/Plan    PT Assessment Patient needs continued PT services  PT Problem List Decreased strength;Decreased mobility;Decreased range of motion;Obesity;Decreased activity tolerance;Cardiopulmonary status limiting activity;Decreased balance       PT Treatment Interventions Gait training;Therapeutic exercise;Balance training;Stair training;Functional mobility training;Therapeutic activities;Neuromuscular re-education;Patient/family education    PT Goals (Current goals can be found in the Care Plan section)  Acute Rehab PT Goals Patient Stated Goal: to go home PT Goal Formulation: With patient Time For Goal Achievement: 08/03/20 Potential to Achieve Goals: Fair    Frequency Min 2X/week   Barriers to discharge Other (comment) no insurance    Co-evaluation               AM-PAC PT "6 Clicks" Mobility  Outcome Measure Help needed turning from your back to your side while in a flat bed without using bedrails?: A Little Help needed moving from lying on your back to sitting on the side of a flat bed without  using bedrails?: A Little Help needed moving to and from a bed to a chair (including a wheelchair)?: A Little Help needed standing up from a chair using your arms (e.g., wheelchair or bedside chair)?: A Little Help needed to walk in hospital room?: A Little Help needed climbing 3-5 steps with a railing? : A Lot 6 Click Score: 17    End of Session Equipment Utilized During Treatment: Gait belt Activity Tolerance:  Patient limited by fatigue Patient left: in chair;with call bell/phone within reach;with chair alarm set Nurse Communication: Mobility status PT Visit Diagnosis: Unsteadiness on feet (R26.81);Other abnormalities of gait and mobility (R26.89);Muscle weakness (generalized) (M62.81);Difficulty in walking, not elsewhere classified (R26.2)    Time: 6812-7517 PT Time Calculation (min) (ACUTE ONLY): 31 min   Charges:   PT Evaluation $PT Eval Moderate Complexity: 1 Mod PT Treatments $Therapeutic Activity: 8-22 mins      Vira Blanco, PT, DPT 12:51 PM,07/20/20

## 2020-07-20 NOTE — Progress Notes (Signed)
Patient has a critical glucose 556, hopitalist notified, see new order, will continue to monitor.

## 2020-07-21 LAB — COMPREHENSIVE METABOLIC PANEL
ALT: 19 U/L (ref 0–44)
AST: 13 U/L — ABNORMAL LOW (ref 15–41)
Albumin: 2.9 g/dL — ABNORMAL LOW (ref 3.5–5.0)
Alkaline Phosphatase: 106 U/L (ref 38–126)
Anion gap: 9 (ref 5–15)
BUN: 23 mg/dL — ABNORMAL HIGH (ref 6–20)
CO2: 26 mmol/L (ref 22–32)
Calcium: 6.5 mg/dL — ABNORMAL LOW (ref 8.9–10.3)
Chloride: 95 mmol/L — ABNORMAL LOW (ref 98–111)
Creatinine, Ser: 0.77 mg/dL (ref 0.44–1.00)
GFR, Estimated: >60 mL/min (ref >60)
Glucose, Bld: 348 mg/dL — ABNORMAL HIGH (ref 70–99)
Potassium: 4.1 mmol/L (ref 3.5–5.1)
Sodium: 130 mmol/L — ABNORMAL LOW (ref 135–145)
Total Bilirubin: 0.7 mg/dL (ref 0.3–1.2)
Total Protein: 6.9 g/dL (ref 6.5–8.1)

## 2020-07-21 LAB — FERRITIN: Ferritin: 507 ng/mL — ABNORMAL HIGH (ref 11–307)

## 2020-07-21 LAB — CBC WITH DIFFERENTIAL/PLATELET
Abs Immature Granulocytes: 0.11 10*3/uL — ABNORMAL HIGH (ref 0.00–0.07)
Basophils Absolute: 0 10*3/uL (ref 0.0–0.1)
Basophils Relative: 0 %
Eosinophils Absolute: 0 10*3/uL (ref 0.0–0.5)
Eosinophils Relative: 0 %
HCT: 39.1 % (ref 36.0–46.0)
Hemoglobin: 13.2 g/dL (ref 12.0–15.0)
Immature Granulocytes: 1 %
Lymphocytes Relative: 8 %
Lymphs Abs: 1.3 10*3/uL (ref 0.7–4.0)
MCH: 29.1 pg (ref 26.0–34.0)
MCHC: 33.8 g/dL (ref 30.0–36.0)
MCV: 86.1 fL (ref 80.0–100.0)
Monocytes Absolute: 0.8 10*3/uL (ref 0.1–1.0)
Monocytes Relative: 5 %
Neutro Abs: 13.4 10*3/uL — ABNORMAL HIGH (ref 1.7–7.7)
Neutrophils Relative %: 86 %
Platelets: 351 10*3/uL (ref 150–400)
RBC: 4.54 MIL/uL (ref 3.87–5.11)
RDW: 13.6 % (ref 11.5–15.5)
WBC: 15.5 10*3/uL — ABNORMAL HIGH (ref 4.0–10.5)
nRBC: 0 % (ref 0.0–0.2)

## 2020-07-21 LAB — GLUCOSE, CAPILLARY
Glucose-Capillary: 417 mg/dL — ABNORMAL HIGH (ref 70–99)
Glucose-Capillary: 379 mg/dL — ABNORMAL HIGH (ref 70–99)
Glucose-Capillary: 316 mg/dL — ABNORMAL HIGH (ref 70–99)
Glucose-Capillary: 372 mg/dL — ABNORMAL HIGH (ref 70–99)

## 2020-07-21 LAB — FIBRIN DERIVATIVES D-DIMER (ARMC ONLY): Fibrin derivatives D-dimer (ARMC): 469.53 ng/mL (FEU) (ref 0.00–499.00)

## 2020-07-21 LAB — MAGNESIUM: Magnesium: 2.2 mg/dL (ref 1.7–2.4)

## 2020-07-21 LAB — C-REACTIVE PROTEIN: CRP: 4.5 mg/dL — ABNORMAL HIGH (ref <1.0)

## 2020-07-21 LAB — PHOSPHORUS: Phosphorus: 2.5 mg/dL (ref 2.5–4.6)

## 2020-07-21 MED ORDER — INSULIN GLARGINE 100 UNIT/ML ~~LOC~~ SOLN
10.0000 [IU] | Freq: Once | SUBCUTANEOUS | Status: AC
  Administered 2020-07-21: 17:00:00 10 [IU] via SUBCUTANEOUS
  Filled 2020-07-21: qty 0.1

## 2020-07-21 MED ORDER — DICYCLOMINE HCL 20 MG PO TABS
20.0000 mg | ORAL_TABLET | Freq: Three times a day (TID) | ORAL | Status: DC
  Administered 2020-07-21 – 2020-07-22 (×5): 20 mg via ORAL
  Filled 2020-07-21 (×7): qty 1

## 2020-07-21 MED ORDER — KETOROLAC TROMETHAMINE 15 MG/ML IJ SOLN
15.0000 mg | Freq: Four times a day (QID) | INTRAMUSCULAR | Status: DC
  Administered 2020-07-21 (×3): 15 mg via INTRAVENOUS
  Filled 2020-07-21 (×6): qty 1

## 2020-07-21 MED ORDER — METHYLPREDNISOLONE SODIUM SUCC 40 MG IJ SOLR
20.0000 mg | Freq: Two times a day (BID) | INTRAMUSCULAR | Status: DC
  Administered 2020-07-21 – 2020-07-22 (×2): 20 mg via INTRAVENOUS
  Filled 2020-07-21 (×2): qty 1

## 2020-07-21 MED ORDER — ENOXAPARIN SODIUM 80 MG/0.8ML ~~LOC~~ SOLN
80.0000 mg | SUBCUTANEOUS | Status: DC
  Administered 2020-07-21: 21:00:00 80 mg via SUBCUTANEOUS
  Filled 2020-07-21 (×2): qty 0.8

## 2020-07-21 MED ORDER — INSULIN ASPART 100 UNIT/ML ~~LOC~~ SOLN
20.0000 [IU] | Freq: Once | SUBCUTANEOUS | Status: AC
  Administered 2020-07-21: 20 [IU] via SUBCUTANEOUS
  Filled 2020-07-21: qty 1

## 2020-07-21 NOTE — Progress Notes (Signed)
Pt verbalized that she did not want staff to call her daughter and give a daily update today.

## 2020-07-21 NOTE — Progress Notes (Signed)
PHARMACIST - PHYSICIAN COMMUNICATION  CONCERNING:  Enoxaparin (Lovenox) for DVT Prophylaxis    RECOMMENDATION: Patient was prescribed enoxaprin 40mg  q12 hours for VTE prophylaxis.   Filed Weights   07/19/20 0853  Weight: (!) 163.3 kg (360 lb)    Body mass index is 65.84 kg/m.  Estimated Creatinine Clearance: 123.9 mL/min (by C-G formula based on SCr of 0.77 mg/dL).   Based on [facility] policy patient is candidate for enoxaparin 0.5mg /kg TBW SQ every 24 hours based on BMI being >30.  DESCRIPTION: Pharmacy has adjusted enoxaparin dose per [facility] policy.  Patient is now receiving enoxaparin 80mg  every 24 hours    [facility], PharmD Clinical Pharmacist  07/21/2020 10:29 AM

## 2020-07-21 NOTE — Evaluation (Signed)
Occupational Therapy Evaluation Patient Details Name: Jasmine Larsen MRN: [id number] DOB: [birth year] Today's Date: 07/21/2020    History of Present Illness Pt is a 54yo F presenting to the ED with c/o SOB, substernal chest pain, and dry cough for 5 days with associated nausea, vomiting, and diarrhea. Pt dx with PNA secondary to COVID. Significant PMH includes: COPD, chronic diastolic CHF, chest pain, hypokalemia, acute respiratory failure with hypoxia, and nausea/vomiting/diarrhea. Imaging significant for multifocal PNA.   Clinical Impression   Jasmine Larsen was seen for OT evaluation this date. Prior to hospital admission, pt was MOD I for mobility using QC and required assist for LB access. Pt lives c daughter available 24/7 in 1 level home. Pt presents to acute OT demonstrating impaired ADL performance and functional mobility 2/2 functional strength/balance deficits, decreased safety awareness, and decreased activity tolerance. Pt received and left up in chair. Pt and daughter (via FaceTime) instructed in DME recs, d/c recs, ECS, falls prevention, home/routines modifications, and HEP. Pt currently requires MAX A don/doff B socks seated EOC. CGA + single UE support standing grooming tasks. Pt would benefit from skilled OT to address noted impairments and functional limitations (see below for any additional details) in order to maximize safety and independence while minimizing falls risk and caregiver burden. Upon hospital discharge, recommend HHOT to maximize pt safety and return to functional independence during meaningful occupations of daily life.     Follow Up Recommendations  Home health OT    Equipment Recommendations  Tub/shower seat;3 in 1 bedside commode    Recommendations for Other Services       Precautions / Restrictions Precautions Precautions: Fall Restrictions Weight Bearing Restrictions: No      Mobility Bed Mobility      General bed mobility comments: received and  left up in chair  Transfers Overall transfer level: Needs assistance Equipment used: None Transfers: Sit to/from Stand Sit to Stand: Supervision    General transfer comment: SBA    Balance Overall balance assessment: Needs assistance Sitting-balance support: Feet supported;No upper extremity supported Sitting balance-Leahy Scale: Good     Standing balance support: Single extremity supported Standing balance-Leahy Scale: Fair          ADL either performed or assessed with clinical judgement   ADL Overall ADL's : Needs assistance/impaired       General ADL Comments: MAX A don/doff B socks seated EOC. CGA + single UE support standing grooming tasks.                   Pertinent Vitals/Pain Pain Assessment: No/denies pain        Extremity/Trunk Assessment Upper Extremity Assessment Upper Extremity Assessment: Generalized weakness   Lower Extremity Assessment Lower Extremity Assessment: Generalized weakness       Communication Communication Communication: No difficulties   Cognition Arousal/Alertness: Awake/alert Behavior During Therapy: WFL for tasks assessed/performed Overall Cognitive Status: Within Functional Limits for tasks assessed          General Comments       Exercises Exercises: Other exercises Other Exercises Other Exercises: Pt and DTR (via FT) educated re: OT role, DME recs, d/c recs, ECS, falls prevention, home/routines modifications, HEP Other Exercises: LBD, sit<>stand, sitting/standing balance/tolerance   Shoulder Instructions      Home Living Family/patient expects to be discharged to:: Private residence Living Arrangements: Children;Other relatives Available Help at Discharge: Family;Available 24 hours/day Type of Home: House Home Access: Stairs to enter [employer] of Steps: 2   Home  Layout: One level     Bathroom Shower/Tub: Producer, television/film/video: Handicapped height Bathroom Accessibility: No    Home Equipment: Cane - quad          Prior Functioning/Environment Level of Independence: Needs assistance  Gait / Transfers Assistance Needed: Mod I with quad cane; limited household ambulation ADL's / Homemaking Assistance Needed: Assist for LB access and IADLs            OT Problem List: Decreased strength;Decreased activity tolerance;Decreased range of motion;Impaired balance (sitting and/or standing);Decreased knowledge of use of DME or AE;Decreased safety awareness      OT Treatment/Interventions: Self-care/ADL training;Therapeutic exercise;Energy conservation;DME and/or AE instruction;Therapeutic activities;Patient/family education;Balance training    OT Goals(Current goals can be found in the care plan section) Acute Rehab OT Goals Patient Stated Goal: to go home OT Goal Formulation: With patient Time For Goal Achievement: 08/04/20 Potential to Achieve Goals: Good ADL Goals Pt Will Perform Grooming: with modified independence;standing (c LRAD PRN) Pt Will Perform Lower Body Dressing: with mod assist;with caregiver independent in assisting;sit to/from stand (c LRAD PRN) Pt Will Transfer to Toilet: with modified independence;ambulating;regular height toilet (c LRAD PRN) Additional ADL Goal #1: Pt will Independently verbalize plan to implement x3 ECS  OT Frequency: Min 1X/week    AM-PAC OT "6 Clicks" Daily Activity     Outcome Measure Help from another person eating meals?: None Help from another person taking care of personal grooming?: A Little Help from another person toileting, which includes using toliet, bedpan, or urinal?: A Little Help from another person bathing (including washing, rinsing, drying)?: A Lot Help from another person to put on and taking off regular upper body clothing?: None Help from another person to put on and taking off regular lower body clothing?: A Lot 6 Click Score: 18   End of Session    Activity Tolerance: Patient tolerated  treatment well Patient left: in chair;with call bell/phone within reach  OT Visit Diagnosis: Other abnormalities of gait and mobility (R26.89);Muscle weakness (generalized) (M62.81)                Time: 7412-8786 OT Time Calculation (min): 31 min Charges:  OT General Charges $OT Visit: 1 Visit OT Evaluation $OT Eval Low Complexity: 1 Low OT Treatments $Self Care/Home Management : 23-37 mins  Kathie Dike, M.S. OTR/L  07/21/20, 1:30 PM  ascom [phone]

## 2020-07-21 NOTE — Progress Notes (Signed)
PROGRESS NOTE    Jasmine Larsen  TDH:[id number] DOB: [birth year] DOA: 07/19/2020 PCP: Patient, No Pcp Per   Brief Narrative:  Patient states that she has been having shortness breath for more than 5 days, which has been progressively worsening.  Patient has dry cough and chest pain.  Patient had fever and chills in the first several days, which has resolved currently.  Patient has nausea, vomiting, diarrhea and generalized abdominal pain.  She has vomited several times with nonbilious nonbloody vomiting.  Patient has had more than 10 times of watery diarrhea each day.  Patient was not vaccinated against Covid19.  COVID-19 PCR positive in ED  Patient weaned to room air however remains symptomatic.  Still endorsing some shortness of breath.  Sugars very uncontrolled.  Patient has no history of diabetes but considering degree of hyperglycemia strongly suspect underlying diabetes   Assessment & Plan:   Principal Problem:   Pneumonia due to COVID-19 virus Active Problems:   Chest pain   Chronic diastolic CHF (congestive heart failure) (HCC)   COPD (chronic obstructive pulmonary disease) (HCC)   Hypokalemia   Hypocalcemia   Tobacco abuse   Acute respiratory failure with hypoxia (HCC)   Hypophosphatemia   Nausea, vomiting and diarrhea  Acute respiratory failure with hypoxia due to pneumonia due to COVID-19 virus:  Patient has oxygen desaturated to 88% on room air.   Chest x-ray showed bilateral infiltration. Weaned to room air at this time Still symptomatic.  Chest pain, cough, shortness of breath Plan: Continue remdesivir per pharmacy consult, dose 3/5 Continue IV steroids for now.  Decrease dose to 20 mg IV twice daily Prone as tolerated.  Patient may not be able to tolerate considering body habitus Stress incentive spirometry use Follow inflammatory markers No diuretic today  Severe hyperglycemia Diabetes mellitus new diagnosis, poor control Hemoglobin A1c 9.0, poor  control Diabetes coordinator following: Lantus 30 units daily Resistant sliding scale Carb modified diet Reduce steroid dose as above  Chest pain  Troponin negative x2.   Low suspicions of acute coronary artery syndrome.   Likely pleuritic chest pain in setting of COVID-19 pneumonia CT angiogram negative for PE Plan: Toradol 15 milligrams every 6 hours As needed Percocet  Chronic diastolic CHF (congestive heart failure) (HCC)  2D echo on 07/14/2016 showed EF of 65-70%.   Patient does not have leg edema.   CHF seem to be compensated.  Patient is not taking diuretics at home. -Watch volume status closely  COPD (chronic obstructive pulmonary disease) (HCC) -Bronchodilators  Electrolytes disturbance  Hypokalemia with potassium 2.9,  Hypocalcemia with calcium 6.6  Hypophosphatemia with a phosphorus 1.3 -repleted all -Corrected on follow-up labs  Tobacco abuse -Nicotine patch.  Patient refused -States that she quit smoking  Nausea, vomiting and diarrhea  This is most likely due to gastroenteritis secondary to COVID-19  Plan: -Supportive care -As needed Zofran -4 times daily Bentyl  Morbid obesity BMI 65.84 This complicates overall care and prognosis   DVT prophylaxis: Lovenox Code Status: Full Family Communication: None today Disposition Plan: Status is: Inpatient  Remains inpatient appropriate because:Inpatient level of care appropriate due to severity of illness   Dispo: The patient is from: Home              Anticipated d/c is to: Home              Anticipated d/c date is: 1 day              Patient currently  is not medically stable to d/c.  Patient's been weaned to room air however remains symptomatic secondary to Covid infection.  Pleuritic type chest pain and GI symptoms including nausea, abdominal pain, poor p.o. intake.  Will attempt to mitigate symptoms no tentatively plan for discharge home on 07/22/2020.  Also will need further titration of her  insulin regimen.  This patient likely needs insulin as outpatient however she is uninsured with no PCP.  [facility]   Consultants:   None  Procedures:   None  Antimicrobials:  Remdesivir   Subjective: Seen and examined.  Endorses pleuritic chest pain and abdominal pain Objective: Vitals:   07/21/20 0043 07/21/20 0626 07/21/20 0816 07/21/20 1202  BP: 130/76 129/65 107/62 118/79  Pulse: 81 75 80 82  Resp: Temp: 97.9 F (36.6 C) 98.2 F (36.8 C) 98.1 F (36.7 C) 97.8 F (36.6 C)  TempSrc: Oral Oral Oral Oral  SpO2: 93% 94% 92% 95%  Weight:      Height:        Intake/Output Summary (Last 24 hours) at 07/21/2020 1317 Last data filed at 07/21/2020 1130 Gross per 24 hour  Intake 344.69 ml  Output --  Net 344.69 ml   Filed Weights   07/19/20 0853  Weight: (!) 163.3 kg    Examination:  General exam: No acute distress.  Appears fatigued Respiratory system: Diffusely decreased lung sounds.  Poor respiratory effort.  Bibasilar crackles.  Normal work of breathing.  Room air Cardiovascular system: Distant heart sounds.  No appreciable murmurs.  No pedal edema Gastrointestinal system: Obese, nontender, nondistended, normal bowel sounds  Central nervous system: Alert and oriented. No focal neurological deficits. Extremities: Symmetric 5 x 5 power. Skin: No rashes, lesions or ulcers Psychiatry: Judgement and insight appear normal. Mood & affect appropriate.     Data Reviewed: I have personally reviewed following labs and imaging studies  CBC: Recent Labs  Lab 07/19/20 0856 07/20/20 0511 07/21/20 0533  WBC 7.5 9.5 15.5*  NEUTROABS  --  8.2* 13.4*  HGB 15.1* 13.1 13.2  HCT 45.5 39.3 39.1  MCV 85.5 86.6 86.1  PLT 318 322 351   Basic Metabolic Panel: Recent Labs  Lab 07/19/20 0856 07/19/20 1534 07/20/20 0511 07/21/20 0533  NA 135  --  131* 130*  K 2.9*  --  4.0 4.1  CL 97*  --  97* 95*  CO2 25  --   22 26  GLUCOSE 251*  --  556* 348*  BUN 9  --  13 23*  CREATININE 0.83  --  0.90 0.77  CALCIUM 6.6*  --  6.5* 6.5*  MG  --  2.0 2.1 2.2  PHOS  --  1.3* 1.2* 2.5   GFR: Estimated Creatinine Clearance: 123.9 mL/min (by C-G formula based on SCr of 0.77 mg/dL). Liver Function Tests: Recent Labs  Lab 07/19/20 0856 07/20/20 0511 07/21/20 0533  AST 28 19 13*  ALT ALKPHOS 116 104 106  BILITOT 1.2 0.8 0.7  PROT 7.8 6.9 6.9  ALBUMIN 3.4* 2.8* 2.9*   Recent Labs  Lab 07/19/20 0856  LIPASE 23   No results for input(s): AMMONIA in the last 168 hours. Coagulation Profile: No results for input(s): INR, PROTIME in the last 168 hours. Cardiac Enzymes: No results for input(s): CKTOTAL, CKMB, CKMBINDEX, TROPONINI in the last 168 hours. BNP (last 3 results) No results for input(s): PROBNP in the last  8760 hours. HbA1C: Recent Labs    07/20/20 0511  HGBA1C 9.0*   CBG: Recent Labs  Lab 07/20/20 1134 07/20/20 1626 07/20/20 2105 07/21/20 0815 07/21/20 1200  GLUCAP 402* 399* 359* 417* 379*   Lipid Profile: Recent Labs    07/19/20 1902  TRIG 167*   Thyroid Function Tests: No results for input(s): TSH, T4TOTAL, FREET4, T3FREE, THYROIDAB in the last 72 hours. Anemia Panel: Recent Labs    07/20/20 0511 07/21/20 0533  FERRITIN 540* 507*   Sepsis Labs: Recent Labs  Lab 07/19/20 1534  PROCALCITON <0.10    Recent Results (from the past 240 hour(s))  Respiratory Panel by RT PCR (Flu A&B, Covid) - Nasopharyngeal Swab     Status: Abnormal   Collection Time: 07/19/20  1:09 PM   Specimen: Nasopharyngeal Swab  Result Value Ref Range Status   SARS Coronavirus 2 by RT PCR POSITIVE (A) NEGATIVE Final    Comment: RESULT CALLED TO, READ BACK BY AND VERIFIED WITH: ZACH REGISTER 07/19/20 1413 KLW (NOTE) SARS-CoV-2 target nucleic acids are DETECTED.  SARS-CoV-2 RNA is generally detectable in upper respiratory specimens  during the acute phase of infection. Positive  results are indicative of the presence of the identified virus, but do not rule out bacterial infection or co-infection with other pathogens not detected by the test. Clinical correlation with patient history and other diagnostic information is necessary to determine patient infection status. The expected result is Negative.  Fact Sheet for Patients:  https://www.moore.com/https://www.fda.gov/media/142436/download  Fact Sheet for Healthcare Providers: https://www.young.biz/https://www.fda.gov/media/142435/download  This test is not yet approved or cleared by the [place] FDA and  has been authorized for detection and/or diagnosis of SARS-CoV-2 by FDA under an Emergency Use Authorization (EUA).  This EUA will remain in effect (meaning this test can be used ) for the duration of  the COVID-19 declaration under Section 564(b)(1) of the Act, 21 U.S.C. section 360bbb-3(b)(1), unless the authorization is terminated or revoked sooner.      Influenza A by PCR NEGATIVE NEGATIVE Final   Influenza B by PCR NEGATIVE NEGATIVE Final    Comment: (NOTE) The Xpert Xpress SARS-CoV-2/FLU/RSV assay is intended as an aid in  the diagnosis of influenza from Nasopharyngeal swab specimens and  should not be used as a sole basis for treatment. Nasal washings and  aspirates are unacceptable for Xpert Xpress SARS-CoV-2/FLU/RSV  testing.  Fact Sheet for Patients: https://www.moore.com/https://www.fda.gov/media/142436/download  Fact Sheet for Healthcare Providers: https://www.young.biz/https://www.fda.gov/media/142435/download  This test is not yet approved or cleared by the [place] FDA and  has been authorized for detection and/or diagnosis of SARS-CoV-2 by  FDA under an Emergency Use Authorization (EUA). This EUA will remain  in effect (meaning this test can be used) for the duration of the  Covid-19 declaration under Section 564(b)(1) of the Act, 21  U.S.C. section 360bbb-3(b)(1), unless the authorization is  terminated or revoked. Performed at [facility], [address] [place], [place] [address]   Culture, sputum-assessment     Status: None   Collection Time: 07/19/20  6:05 PM   Specimen: Sputum  Result Value Ref Range Status   Specimen Description SPUTUM  Final   Special Requests NONE  Final   Sputum evaluation   Final    THIS SPECIMEN IS ACCEPTABLE FOR SPUTUM CULTURE Performed at [facility], [address] [place], [place] [address]    Report Status 07/19/2020 FINAL  Final  Culture, respiratory     Status: None (Preliminary result)  Collection Time: 07/19/20  6:05 PM   Specimen: SPU  Result Value Ref Range Status   Specimen Description   Final    SPUTUM Performed at [facility], [address] [place], [place] [address]    Special Requests   Final    NONE Reflexed from [id number] Performed at [facility], [address] [place], [place] [address]    Gram Stain   Final    FEW WBC PRESENT,BOTH PMN AND MONONUCLEAR MODERATE GRAM NEGATIVE RODS RARE GRAM POSITIVE COCCI IN PAIRS    Culture   Final    CULTURE REINCUBATED FOR BETTER GROWTH Performed at [facility], [address] [place], [place] [address]    Report Status PENDING  Incomplete  Culture, blood (Routine X 2) w Reflex to ID Panel     Status: None (Preliminary result)   Collection Time: 07/19/20  7:15 PM   Specimen: BLOOD  Result Value Ref Range Status   Specimen Description BLOOD LEFT ANTECUBITAL  Final   Special Requests   Final    BOTTLES DRAWN AEROBIC AND ANAEROBIC Blood Culture adequate volume   Culture   Final    NO GROWTH < 12 HOURS Performed at [facility], [address] [place], [place] [address]    Report Status PENDING  Incomplete  Culture, blood (Routine X 2) w Reflex to ID Panel     Status: None (Preliminary result)   Collection Time: 07/19/20  7:16 PM   Specimen: BLOOD  Result Value Ref Range Status   Specimen Description BLOOD BLOOD LEFT HAND  Final   Special Requests   Final    BOTTLES DRAWN  AEROBIC AND ANAEROBIC Blood Culture adequate volume   Culture   Final    NO GROWTH < 12 HOURS Performed at [facility], [address] [place], [place] [address]    Report Status PENDING  Incomplete  C Difficile Quick Screen w PCR reflex     Status: None   Collection Time: 07/19/20  7:36 PM   Specimen: STOOL  Result Value Ref Range Status   C Diff antigen NEGATIVE NEGATIVE Final   C Diff toxin NEGATIVE NEGATIVE Final   C Diff interpretation No C. difficile detected.  Final    Comment: Performed at [facility], [address] [place], [place] [address]         Radiology Studies: CT ANGIO CHEST PE W OR WO CONTRAST  Result Date: 07/19/2020 CLINICAL DATA:  Hypoxia.  COVID positive.  Elevated D-dimer. EXAM: CT ANGIOGRAPHY CHEST WITH CONTRAST TECHNIQUE: Multidetector CT imaging of the chest was performed using the standard protocol during bolus administration of intravenous contrast. Multiplanar CT image reconstructions and MIPs were obtained to evaluate the vascular anatomy. CONTRAST:  67mL OMNIPAQUE IOHEXOL 350 MG/ML SOLN COMPARISON:  None. FINDINGS: Cardiovascular: Evaluation for acute pulmonary emboli is severely limited by respiratory motion artifact and streak artifact through the chest secondary to the patient's body habitus. Given the significant limitations, no large centrally located pulmonary embolism was detected. Detection of smaller pulmonary emboli is not possible on this study. There is cardiomegaly. There is no significant pericardial effusion. No evidence for thoracic aortic dissection or aneurysm. Mediastinum/Nodes: --mild mediastinal adenopathy is noted --there is mild hilar adenopathy. -- No axillary lymphadenopathy. -- No supraclavicular lymphadenopathy. -- Normal thyroid gland where visualized. -  Unremarkable esophagus. Lungs/Pleura: There are diffuse bilateral ground-glass airspace opacities. There is no pneumothorax. No large pleural effusion. No  significant abnormality  involving the trachea. Upper Abdomen: Contrast bolus timing is not optimized for evaluation of the abdominal organs. There is hepatic steatosis. Musculoskeletal: No chest wall abnormality. No bony spinal canal stenosis. Review of the MIP images confirms the above findings. IMPRESSION: 1. Very limited study as detailed above. Given these limitations, no acute pulmonary embolism was detected. 2. Diffuse bilateral ground-glass airspace opacities, consistent with the patient's history of viral pneumonia. 3. Cardiomegaly. 4. Mild mediastinal and hilar adenopathy, likely reactive. 5. Hepatic steatosis. Electronically Signed   By: Katherine Mantle M.D.   On: 07/19/2020 19:13        Scheduled Meds: . vitamin C  500 mg Oral Daily  . dicyclomine  20 mg Oral TID AC & HS  . enoxaparin (LOVENOX) injection  80 mg Subcutaneous Q24H  . influenza vac split quadrivalent PF  0.5 mL Intramuscular Tomorrow-1000  . insulin aspart  0-20 Units Subcutaneous TID AC & HS  . insulin glargine  10 Units Subcutaneous Once  . insulin glargine  20 Units Subcutaneous Daily  . ipratropium  2 puff Inhalation Q4H  . ketorolac  15 mg Intravenous Q6H  . melatonin  5 mg Oral QHS  . methylPREDNISolone (SOLU-MEDROL) injection  40 mg Intravenous Q12H  . pneumococcal 23 valent vaccine  0.5 mL Intramuscular Tomorrow-1000  . zinc sulfate  220 mg Oral Daily   Continuous Infusions: . remdesivir 100 mg in NS 100 mL Stopped (07/21/20 1012)     LOS: 2 days    Time spent: 25 minutes    Tresa Moore, MD [facility] Pager [phone]  If 7PM-7AM, please contact night-coverage 07/21/2020, 1:17 PM

## 2020-07-22 DIAGNOSIS — J1282 Pneumonia due to coronavirus disease 2019: Secondary | ICD-10-CM

## 2020-07-22 DIAGNOSIS — U071 COVID-19: Principal | ICD-10-CM

## 2020-07-22 LAB — COMPREHENSIVE METABOLIC PANEL
ALT: 21 U/L (ref 0–44)
AST: 17 U/L (ref 15–41)
Albumin: 2.8 g/dL — ABNORMAL LOW (ref 3.5–5.0)
Alkaline Phosphatase: 93 U/L (ref 38–126)
Anion gap: 11 (ref 5–15)
BUN: 30 mg/dL — ABNORMAL HIGH (ref 6–20)
CO2: 26 mmol/L (ref 22–32)
Calcium: 6.5 mg/dL — ABNORMAL LOW (ref 8.9–10.3)
Chloride: 94 mmol/L — ABNORMAL LOW (ref 98–111)
Creatinine, Ser: 0.84 mg/dL (ref 0.44–1.00)
GFR, Estimated: >60 mL/min (ref >60)
Glucose, Bld: 334 mg/dL — ABNORMAL HIGH (ref 70–99)
Potassium: 4.5 mmol/L (ref 3.5–5.1)
Sodium: 131 mmol/L — ABNORMAL LOW (ref 135–145)
Total Bilirubin: 0.6 mg/dL (ref 0.3–1.2)
Total Protein: 6.4 g/dL — ABNORMAL LOW (ref 6.5–8.1)

## 2020-07-22 LAB — CBC WITH DIFFERENTIAL/PLATELET
Abs Immature Granulocytes: 0.13 10*3/uL — ABNORMAL HIGH (ref 0.00–0.07)
Basophils Absolute: 0 10*3/uL (ref 0.0–0.1)
Basophils Relative: 0 %
Eosinophils Absolute: 0 10*3/uL (ref 0.0–0.5)
Eosinophils Relative: 0 %
HCT: 39 % (ref 36.0–46.0)
Hemoglobin: 12.8 g/dL (ref 12.0–15.0)
Immature Granulocytes: 1 %
Lymphocytes Relative: 10 %
Lymphs Abs: 1.2 10*3/uL (ref 0.7–4.0)
MCH: 28 pg (ref 26.0–34.0)
MCHC: 32.8 g/dL (ref 30.0–36.0)
MCV: 85.3 fL (ref 80.0–100.0)
Monocytes Absolute: 0.8 10*3/uL (ref 0.1–1.0)
Monocytes Relative: 6 %
Neutro Abs: 10.7 10*3/uL — ABNORMAL HIGH (ref 1.7–7.7)
Neutrophils Relative %: 83 %
Platelets: 315 10*3/uL (ref 150–400)
RBC: 4.57 MIL/uL (ref 3.87–5.11)
RDW: 13.6 % (ref 11.5–15.5)
WBC: 12.9 10*3/uL — ABNORMAL HIGH (ref 4.0–10.5)
nRBC: 0 % (ref 0.0–0.2)

## 2020-07-22 LAB — FERRITIN: Ferritin: 410 ng/mL — ABNORMAL HIGH (ref 11–307)

## 2020-07-22 LAB — GLUCOSE, CAPILLARY
Glucose-Capillary: 347 mg/dL — ABNORMAL HIGH (ref 70–99)
Glucose-Capillary: 371 mg/dL — ABNORMAL HIGH (ref 70–99)

## 2020-07-22 LAB — PHOSPHORUS: Phosphorus: 2.8 mg/dL (ref 2.5–4.6)

## 2020-07-22 LAB — FIBRIN DERIVATIVES D-DIMER (ARMC ONLY): Fibrin derivatives D-dimer (ARMC): 327.29 ng/mL (FEU) (ref 0.00–499.00)

## 2020-07-22 LAB — CULTURE, RESPIRATORY W GRAM STAIN: Culture: NORMAL

## 2020-07-22 LAB — MAGNESIUM: Magnesium: 2.2 mg/dL (ref 1.7–2.4)

## 2020-07-22 LAB — C-REACTIVE PROTEIN: CRP: 2.4 mg/dL — ABNORMAL HIGH (ref <1.0)

## 2020-07-22 MED ORDER — PREDNISONE 20 MG PO TABS: 20.0000 mg | ORAL_TABLET | Freq: Every day | ORAL | 0 refills | Status: DC

## 2020-07-22 MED ORDER — METFORMIN HCL 500 MG PO TABS: 500.0000 mg | ORAL_TABLET | Freq: Two times a day (BID) | ORAL | 0 refills | Status: DC

## 2020-07-22 MED ORDER — BENZONATATE 100 MG PO CAPS: 100.0000 mg | ORAL_CAPSULE | Freq: Three times a day (TID) | ORAL | 0 refills | Status: DC | PRN

## 2020-07-22 MED ORDER — GLIPIZIDE 5 MG PO TABS: 5.0000 mg | ORAL_TABLET | Freq: Two times a day (BID) | ORAL | 0 refills | Status: DC

## 2020-07-22 MED ORDER — BENZONATATE 100 MG PO CAPS: 100.0000 mg | ORAL_CAPSULE | Freq: Three times a day (TID) | ORAL | 0 refills | Status: AC | PRN

## 2020-07-22 MED ORDER — ONDANSETRON HCL 4 MG PO TABS: 4.0000 mg | ORAL_TABLET | Freq: Four times a day (QID) | ORAL | 0 refills | Status: DC | PRN

## 2020-07-22 MED ORDER — DICYCLOMINE HCL 20 MG PO TABS: 20.0000 mg | ORAL_TABLET | Freq: Three times a day (TID) | ORAL | 0 refills | Status: DC

## 2020-07-22 MED ORDER — BLOOD GLUCOSE MONITOR KIT: PACK | 0 refills | Status: DC

## 2020-07-22 MED ORDER — ALBUTEROL SULFATE HFA 108 (90 BASE) MCG/ACT IN AERS: 2.0000 | INHALATION_SPRAY | Freq: Four times a day (QID) | RESPIRATORY_TRACT | 1 refills | Status: DC | PRN

## 2020-07-22 MED ORDER — INSULIN GLARGINE 100 UNIT/ML ~~LOC~~ SOLN
35.0000 [IU] | Freq: Every day | SUBCUTANEOUS | Status: DC
  Administered 2020-07-22: 35 [IU] via SUBCUTANEOUS
  Filled 2020-07-22 (×2): qty 0.35

## 2020-07-22 MED ORDER — PREDNISONE 20 MG PO TABS: 20.0000 mg | ORAL_TABLET | Freq: Every day | ORAL | 0 refills | Status: AC

## 2020-07-22 NOTE — Discharge Summary (Signed)
Physician Discharge Summary  Jasmine Larsen TXM:[id number] DOB: 1966-05-15 DOA: 07/19/2020  PCP: Patient, No Pcp Per  Admit date: 07/19/2020 Discharge date: 07/22/2020  Admitted From: Home Disposition: Home Recommendations for Outpatient Follow-up:  1. Follow up with PCP in 1-2 weeks 2.   Home Health:Yes Equipment/Devices:None Discharge Condition:Stable CODE STATUS:full Diet recommendation: Heart Healthy / Carb Modified  Brief/Interim Summary: Patient states that she has been having shortness breath for more than 5 days, which has been progressively worsening. Patient has dry cough and chest pain. Patient had fever and chills in the first several days, which has resolved currently. Patient has nausea, vomiting, diarrhea and generalized abdominal pain. She has vomited several times with nonbilious nonbloody vomiting. Patient has had more than 10 times of watery diarrhea each day. Patient was not vaccinated against Covid19.  COVID-19 PCR positive in ED  Patient weaned to room air however remains symptomatic.  Still endorsing some shortness of breath.  Sugars very uncontrolled.  Patient has no history of diabetes but considering degree of hyperglycemia strongly suspect underlying diabetes  Hemoglobin A1c returned at 9.0 indicating underlying diabetes mellitus.  From a inpatient standpoint patient stable for discharge.  She is not requiring any oxygen and is afebrile.  Will discharge home at this time with prednisone to complete 10-day total course.  We will also prescribe as needed Tessalon Perles and albuterol MDI.  Also as needed Bentyl and Zofran provided.  In regards to diabetes management patient is strongly encouraged to [facility] for [facility] of [facility]CP.  Will discharge with a 1 month supply of Metformin 500 mg twice daily and glipizide 5 mg twice daily.  Diabetic diet instructions and recommendations provided in discharge packet.  Also provided  prescription for home glucometer.  Discharge Diagnoses:  Principal Problem:   Pneumonia due to COVID-19 virus Active Problems:   Chest pain   Chronic diastolic CHF (congestive heart failure) (HCC)   COPD (chronic obstructive pulmonary disease) (HCC)   Hypokalemia   Hypocalcemia   Tobacco abuse   Acute respiratory failure with hypoxia (HCC)   Hypophosphatemia   Nausea, vomiting and diarrhea  Acute respiratory failure with hypoxiadue to pneumonia due to COVID-19 virus: Patient has oxygen desaturated to 88% on room air.  Chest x-ray showed bilateral infiltration. Weaned to room air at this time Still symptomatic.  Chest pain, cough, shortness of breath This resolved the time of discharge.  Stable for discharge home at this time.  Complete prednisone to complete total 10-day steroid course.  No diuretics indicated on discharge.  As needed albuterol MDI and Tessalon Perles provided.  Severe hyperglycemia Diabetes mellitus new diagnosis, poor control Hemoglobin A1c 9.0, poor control Diabetes coordinator following: Insulin regimen while in house.  Metformin and glipizide prescribed on discharge.  1 month supply.  Patient is encouraged to [facility].  She was given follow-up instructions by [facility]  Chest pain Troponin negative x2.  Low suspicions of acute coronary artery syndrome.  Likely pleuritic chest pain in setting of COVID-19 pneumonia Resolved the time of discharge  Chronic diastolic CHF (congestive heart failure) ([place]) 2D echo on 07/14/2016 showed EF of 65-70%.  Patient does not have leg edema.  CHF seem to be compensated. Patient is not taking diuretics at home. -Watch volume status closely PCP follow-up.  No diuretics prescribed on discharge  COPD (chronic obstructive pulmonary disease) (HCC) PCP follow-up  Electrolytes disturbance Hypokalemiawith potassium 2.9, Hypocalcemiawith calcium 6.6 Hypophosphatemiawith a  phosphorus 1.3 -  repleted all -Corrected on follow-up labs  Tobacco abuse -Nicotine patch.  Patient refused -States that she quit smoking  Nausea, vomiting and diarrhea This is most likely due to gastroenteritis secondary to COVID-19  Plan: -Supportive care -As needed Zofran -4 times daily Bentyl -Resolved the time of discharge  Morbid obesity BMI 28.78 This complicates overall care and prognosis  Discharge Instructions  Discharge Instructions    Diet - low sodium heart healthy   Complete by: As directed    Increase activity slowly   Complete by: As directed    MyChart COVID-19 home monitoring program   Complete by: Jul 22, 2020    Is the patient willing to use the [facility] for home monitoring?: Yes   Temperature monitoring   Complete by: Jul 22, 2020    After how many days would you like to receive a notification of this patient's flowsheet entries?: 1     Allergies as of 07/22/2020   No Known Allergies     Medication List    TAKE these medications   albuterol 108 (90 Base) MCG/ACT inhaler Commonly known as: VENTOLIN HFA Inhale 2 puffs into the lungs every 6 (six) hours as needed for wheezing or shortness of breath.   benzonatate 100 MG capsule Commonly known as: Tessalon Perles Take 1 capsule (100 mg total) by mouth 3 (three) times daily as needed for cough.   blood glucose meter kit and supplies Kit Dispense based on patient and insurance preference. Use up to four times daily as directed. (FOR ICD-9 250.00, 250.01).   dicyclomine 20 MG tablet Commonly known as: BENTYL Take 1 tablet (20 mg total) by mouth 4 (four) times daily -  before meals and at bedtime for 14 days.   glipiZIDE 5 MG tablet Commonly known as: GLUCOTROL Take 1 tablet (5 mg total) by mouth 2 (two) times daily.   metFORMIN 500 MG tablet Commonly known as: Glucophage Take 1 tablet (500 mg total) by mouth 2 (two) times daily with a meal.   ondansetron 4 MG tablet Commonly  known as: ZOFRAN Take 1 tablet (4 mg total) by mouth every 6 (six) hours as needed for nausea or vomiting.   predniSONE 20 MG tablet Commonly known as: Deltasone Take 1 tablet (20 mg total) by mouth daily for 8 days.            Durable Medical Equipment  (From admission, onward)         Start     Ordered   07/20/20 1610  For home use only DME Walker rolling  Once       Comments: Bariatric walker needed due to body habitus  Question Answer Comment  Walker: With [facility]   Patient needs a walker to treat with the following condition COVID-19      07/20/20 1610   07/20/20 1610  For home use only DME 3 n 1  Once       Comments: Bariatric needed due to body habitus   07/20/20 1610          No Known Allergies  Consultations:  None   Procedures/Studies: DG Chest 2 View  Result Date: 07/19/2020 CLINICAL DATA:  Shortness of breath. EXAM: CHEST - 2 VIEW COMPARISON:  July 13, 2016. FINDINGS: The heart size and mediastinal contours are within normal limits. No pneumothorax or pleural effusion is noted. Large bilateral patchy airspace opacities are noted concerning for multifocal pneumonia. The visualized skeletal structures are unremarkable. IMPRESSION: Large  bilateral patchy airspace opacities are noted concerning for multifocal pneumonia. Electronically Signed   By: Marijo Conception M.D.   On: 07/19/2020 09:36   CT ANGIO CHEST PE W OR WO CONTRAST  Result Date: 07/19/2020 CLINICAL DATA:  Hypoxia.  COVID positive.  Elevated D-dimer. EXAM: CT ANGIOGRAPHY CHEST WITH CONTRAST TECHNIQUE: Multidetector CT imaging of the chest was performed using the standard protocol during bolus administration of intravenous contrast. Multiplanar CT image reconstructions and MIPs were obtained to evaluate the vascular anatomy. CONTRAST:  21m OMNIPAQUE IOHEXOL 350 MG/ML SOLN COMPARISON:  None. FINDINGS: Cardiovascular: Evaluation for acute pulmonary emboli is severely limited by respiratory  motion artifact and streak artifact through the chest secondary to the patient's body habitus. Given the significant limitations, no large centrally located pulmonary embolism was detected. Detection of smaller pulmonary emboli is not possible on this study. There is cardiomegaly. There is no significant pericardial effusion. No evidence for thoracic aortic dissection or aneurysm. Mediastinum/Nodes: --mild mediastinal adenopathy is noted --there is mild hilar adenopathy. -- No axillary lymphadenopathy. -- No supraclavicular lymphadenopathy. -- Normal thyroid gland where visualized. -  Unremarkable esophagus. Lungs/Pleura: There are diffuse bilateral ground-glass airspace opacities. There is no pneumothorax. No large pleural effusion. No significant abnormality involving the trachea. Upper Abdomen: Contrast bolus timing is not optimized for evaluation of the abdominal organs. There is hepatic steatosis. Musculoskeletal: No chest wall abnormality. No bony spinal canal stenosis. Review of the MIP images confirms the above findings. IMPRESSION: 1. Very limited study as detailed above. Given these limitations, no acute pulmonary embolism was detected. 2. Diffuse bilateral ground-glass airspace opacities, consistent with the patient's history of viral pneumonia. 3. Cardiomegaly. 4. Mild mediastinal and hilar adenopathy, likely reactive. 5. Hepatic steatosis. Electronically Signed   By: CConstance HolsterM.D.   On: 07/19/2020 19:13    (Echo, Carotid, EGD, Colonoscopy, ERCP)    Subjective: Patient seen and examined on the day of discharge.  She is in no distress.  She is tolerating p.o. without issues.  No nausea or vomiting noted.  No abdominal pain.  No fevers or hypoxia noted stable for discharge home  Discharge Exam: Vitals:   07/22/20 0743 07/22/20 1146  BP: 121/79 118/64  Pulse: 74 80  Resp: 20 20  Temp: 98.6 F (37 C) 98.2 F (36.8 C)  SpO2: 93% 95%   Vitals:   07/21/20 2328 07/22/20 0416  07/22/20 0743 07/22/20 1146  BP: 118/69 132/79 121/79 118/64  Pulse: 73 68 74 80  Resp: _0 Temp: 97.7 F (36.5 C) 98.4 F (36.9 C) 98.6 F (37 C) 98.2 F (36.8 C)  TempSrc: Oral Oral  Oral  SpO2: 93% 95% 93% 95%  Weight:      Height:        General: Pt is alert, awake, not in acute distress Cardiovascular: RRR, S1/S2 +, no rubs, no gallops Respiratory: CTA bilaterally, no wheezing, no rhonchi Abdominal: Soft, NT, ND, bowel sounds + Extremities: no edema, no cyanosis    The results of significant diagnostics from this hospitalization (including imaging, microbiology, ancillary and laboratory) are listed below for reference.     Microbiology: Recent Results (from the past 240 hour(s))  Respiratory Panel by RT PCR (Flu A&B, Covid) - Nasopharyngeal Swab     Status: Abnormal   Collection Time: 07/19/20  1:09 PM   Specimen: Nasopharyngeal Swab  Result Value Ref Range Status   SARS Coronavirus 2 by RT PCR POSITIVE (A) NEGATIVE Final  Comment: RESULT CALLED TO, READ BACK BY AND VERIFIED WITH: ZACH REGISTER 07/19/20 1413 KLW (NOTE) SARS-CoV-2 target nucleic acids are DETECTED.  SARS-CoV-2 RNA is generally detectable in upper respiratory specimens  during the acute phase of infection. Positive results are indicative of the presence of the identified virus, but do not rule out bacterial infection or co-infection with other pathogens not detected by the test. Clinical correlation with patient history and other diagnostic information is necessary to determine patient infection status. The expected result is Negative.  Fact Sheet for Patients:  PinkCheek.be  Fact Sheet for Healthcare Providers: GravelBags.it  This test is not yet approved or cleared by the [place] FDA and  has been authorized for detection and/or diagnosis of SARS-CoV-2 by FDA under an Emergency Use Authorization (EUA).  This EUA  will remain in effect (meaning this test can be used ) for the duration of  the COVID-19 declaration under Section 564(b)(1) of the Act, 21 U.S.C. section 360bbb-3(b)(1), unless the authorization is terminated or revoked sooner.      Influenza A by PCR NEGATIVE NEGATIVE Final   Influenza B by PCR NEGATIVE NEGATIVE Final    Comment: (NOTE) The Xpert Xpress SARS-CoV-2/FLU/RSV assay is intended as an aid in  the diagnosis of influenza from Nasopharyngeal swab specimens and  should not be used as a sole basis for treatment. Nasal washings and  aspirates are unacceptable for Xpert Xpress SARS-CoV-2/FLU/RSV  testing.  Fact Sheet for Patients: PinkCheek.be  Fact Sheet for Healthcare Providers: GravelBags.it  This test is not yet approved or cleared by the [place] FDA and  has been authorized for detection and/or diagnosis of SARS-CoV-2 by  FDA under an Emergency Use Authorization (EUA). This EUA will remain  in effect (meaning this test can be used) for the duration of the  Covid-19 declaration under Section 564(b)(1) of the Act, 21  U.S.C. section 360bbb-3(b)(1), unless the authorization is  terminated or revoked. Performed at [facility], [address] [place], [place] [address]   Culture, sputum-assessment     Status: None   Collection Time: 07/19/20  6:05 PM   Specimen: Sputum  Result Value Ref Range Status   Specimen Description SPUTUM  Final   Special Requests NONE  Final   Sputum evaluation   Final    THIS SPECIMEN IS ACCEPTABLE FOR SPUTUM CULTURE Performed at [facility], [address], [place], [place] [address]    Report Status 07/19/2020 FINAL  Final  Culture, respiratory     Status: None   Collection Time: 07/19/20  6:05 PM   Specimen: SPU  Result Value Ref Range Status   Specimen Description   Final    SPUTUM Performed at [facility], [address]  [place], [place] [address]    Special Requests   Final    NONE Reflexed from [id number] Performed at [facility], [address] [place], [place] [address]    Gram Stain   Final    FEW WBC PRESENT,BOTH PMN AND MONONUCLEAR MODERATE GRAM NEGATIVE RODS RARE GRAM POSITIVE COCCI IN PAIRS    Culture   Final    Normal respiratory flora-no Staph aureus or Pseudomonas seen Performed at [facility], [place] [address], [place], [place] [address]    Report Status 07/22/2020 FINAL  Final  Culture, blood (Routine X 2) w Reflex to ID Panel     Status: None (Preliminary result)   Collection Time: 07/19/20  7:15 PM  Specimen: BLOOD  Result Value Ref Range Status   Specimen Description BLOOD LEFT ANTECUBITAL  Final   Special Requests   Final    BOTTLES DRAWN AEROBIC AND ANAEROBIC Blood Culture adequate volume   Culture   Final    NO GROWTH 3 DAYS Performed at [facility], [address], [place], [place] [address]    Report Status PENDING  Incomplete  Culture, blood (Routine X 2) w Reflex to ID Panel     Status: None (Preliminary result)   Collection Time: 07/19/20  7:16 PM   Specimen: BLOOD  Result Value Ref Range Status   Specimen Description BLOOD BLOOD LEFT HAND  Final   Special Requests   Final    BOTTLES DRAWN AEROBIC AND ANAEROBIC Blood Culture adequate volume   Culture   Final    NO GROWTH 3 DAYS Performed at [facility], [address], [place], [place] [address]    Report Status PENDING  Incomplete  C Difficile Quick Screen w PCR reflex     Status: None   Collection Time: 07/19/20  7:36 PM   Specimen: STOOL  Result Value Ref Range Status   C Diff antigen NEGATIVE NEGATIVE Final   C Diff toxin NEGATIVE NEGATIVE Final   C Diff interpretation No C. difficile detected.  Final    Comment: Performed at [facility], [address] [place], [place] [address]     Labs: BNP (last 3 results) Recent Labs    07/19/20 0856  BNP 68.1   Basic  Metabolic Panel: Recent Labs  Lab 07/19/20 0856 07/19/20 1534 07/20/20 0511 07/21/20 0533 07/22/20 0359  NA 135  --  131* 130* 131*  K 2.9*  --  4.0 4.1 4.5  CL 97*  --  97* 95* 94*  CO2 25  --  _0 GLUCOSE 251*  --  556* 348* 334*  BUN 9  --  13 23* 30*  CREATININE 0.83  --  0.90 0.77 0.84  CALCIUM 6.6*  --  6.5* 6.5* 6.5*  MG  --  2.0 2.1 2.2 2.2  PHOS  --  1.3* 1.2* 2.5 2.8   Liver Function Tests: Recent Labs  Lab 07/19/20 0856 07/20/20 0511 07/21/20 0533 07/22/20 0359  AST 28 19 13* 17  ALT _1 ALKPHOS 116 104 106 93  BILITOT 1.2 0.8 0.7 0.6  PROT 7.8 6.9 6.9 6.4*  ALBUMIN 3.4* 2.8* 2.9* 2.8*   Recent Labs  Lab 07/19/20 0856  LIPASE 23   No results for input(s): AMMONIA in the last 168 hours. CBC: Recent Labs  Lab 07/19/20 0856 07/20/20 0511 07/21/20 0533 07/22/20 0359  WBC 7.5 9.5 15.5* 12.9*  NEUTROABS  --  8.2* 13.4* 10.7*  HGB 15.1* 13.1 13.2 12.8  HCT 45.5 39.3 39.1 39.0  MCV 85.5 86.6 86.1 85.3  PLT 318 322 351 315   Cardiac Enzymes: No results for input(s): CKTOTAL, CKMB, CKMBINDEX, TROPONINI in the last 168 hours. BNP: Invalid input(s): POCBNP CBG: Recent Labs  Lab 07/21/20 1200 07/21/20 1631 07/21/20 2038 07/22/20 0744 07/22/20 1143  GLUCAP 379* 316* 372* 347* 371*   D-Dimer No results for input(s): DDIMER in the last 72 hours. Hgb A1c Recent Labs    07/20/20 0511  HGBA1C 9.0*   Lipid Profile Recent Labs    07/19/20 1902  TRIG 167*   Thyroid function studies No results for input(s): TSH, T4TOTAL, T3FREE, THYROIDAB in the last 72 hours.  Invalid input(s): FREET3 Anemia work up Recent Labs    07/21/20 0533 07/22/20 0359  FERRITIN 507* 410*   Urinalysis    Component Value Date/Time   COLORURINE AMBER (A) 07/19/2020 1937   APPEARANCEUR HAZY (A) 07/19/2020 1937   LABSPEC >1.046 (H) 07/19/2020 1937   PHURINE 5.0 07/19/2020 1937   GLUCOSEU >=500 (A) 07/19/2020 1937   HGBUR LARGE (A) 07/19/2020  1937   BILIRUBINUR NEGATIVE 07/19/2020 1937   KETONESUR 20 (A) 07/19/2020 1937   PROTEINUR 30 (A) 07/19/2020 1937   NITRITE NEGATIVE 07/19/2020 1937   LEUKOCYTESUR TRACE (A) 07/19/2020 1937   Sepsis Labs Invalid input(s): PROCALCITONIN,  WBC,  LACTICIDVEN Microbiology Recent Results (from the past 240 hour(s))  Respiratory Panel by RT PCR (Flu A&B, Covid) - Nasopharyngeal Swab     Status: Abnormal   Collection Time: 07/19/20  1:09 PM   Specimen: Nasopharyngeal Swab  Result Value Ref Range Status   SARS Coronavirus 2 by RT PCR POSITIVE (A) NEGATIVE Final    Comment: RESULT CALLED TO, READ BACK BY AND VERIFIED WITH: ZACH REGISTER 07/19/20 1413 KLW (NOTE) SARS-CoV-2 target nucleic acids are DETECTED.  SARS-CoV-2 RNA is generally detectable in upper respiratory specimens  during the acute phase of infection. Positive results are indicative of the presence of the identified virus, but do not rule out bacterial infection or co-infection with other pathogens not detected by the test. Clinical correlation with patient history and other diagnostic information is necessary to determine patient infection status. The expected result is Negative.  Fact Sheet for Patients:  PinkCheek.be  Fact Sheet for Healthcare Providers: GravelBags.it  This test is not yet approved or cleared by the [place] FDA and  has been authorized for detection and/or diagnosis of SARS-CoV-2 by FDA under an Emergency Use Authorization (EUA).  This EUA will remain in effect (meaning this test can be used ) for the duration of  the COVID-19 declaration under Section 564(b)(1) of the Act, 21 U.S.C. section 360bbb-3(b)(1), unless the authorization is terminated or revoked sooner.      Influenza A by PCR NEGATIVE NEGATIVE Final   Influenza B by PCR NEGATIVE NEGATIVE Final    Comment: (NOTE) The Xpert Xpress SARS-CoV-2/FLU/RSV assay is intended as an  aid in  the diagnosis of influenza from Nasopharyngeal swab specimens and  should not be used as a sole basis for treatment. Nasal washings and  aspirates are unacceptable for Xpert Xpress SARS-CoV-2/FLU/RSV  testing.  Fact Sheet for Patients: PinkCheek.be  Fact Sheet for Healthcare Providers: GravelBags.it  This test is not yet approved or cleared by the [place] FDA and  has been authorized for detection and/or diagnosis of SARS-CoV-2 by  FDA under an Emergency Use Authorization (EUA). This EUA will remain  in effect (meaning this test can be used) for the duration of the  Covid-19 declaration under Section 564(b)(1) of the Act, 21  U.S.C. section 360bbb-3(b)(1), unless the authorization is  terminated or revoked. Performed at [facility], [address] [place], [place] [address]   Culture, sputum-assessment     Status: None   Collection Time: 07/19/20  6:05 PM   Specimen: Sputum  Result Value Ref Range Status   Specimen Description SPUTUM  Final   Special Requests NONE  Final   Sputum evaluation   Final    THIS SPECIMEN IS ACCEPTABLE FOR SPUTUM CULTURE Performed at [facility], [address] [place], [place] [address]    Report Status 07/19/2020 FINAL  Final  Culture, respiratory     Status: None   Collection Time: 07/19/20  6:05 PM   Specimen: SPU  Result Value Ref Range Status   Specimen Description   Final    SPUTUM Performed at [facility], [address] [place], [place] [address]    Special Requests   Final    NONE Reflexed from [id number] Performed at [facility], [address] [place], [place] [address]    Gram Stain   Final    FEW WBC PRESENT,BOTH PMN AND MONONUCLEAR MODERATE GRAM NEGATIVE RODS RARE GRAM POSITIVE COCCI IN PAIRS    Culture   Final    Normal respiratory flora-no Staph aureus or Pseudomonas seen Performed at [facility],  [place] [address] [place], [place] [address]    Report Status 07/22/2020 FINAL  Final  Culture, blood (Routine X 2) w Reflex to ID Panel     Status: None (Preliminary result)   Collection Time: 07/19/20  7:15 PM   Specimen: BLOOD  Result Value Ref Range Status   Specimen Description BLOOD LEFT ANTECUBITAL  Final   Special Requests   Final    BOTTLES DRAWN AEROBIC AND ANAEROBIC Blood Culture adequate volume   Culture   Final    NO GROWTH 3 DAYS Performed at [facility], [address] [place], [place] [address]    Report Status PENDING  Incomplete  Culture, blood (Routine X 2) w Reflex to ID Panel     Status: None (Preliminary result)   Collection Time: 07/19/20  7:16 PM   Specimen: BLOOD  Result Value Ref Range Status   Specimen Description BLOOD BLOOD LEFT HAND  Final   Special Requests   Final    BOTTLES DRAWN AEROBIC AND ANAEROBIC Blood Culture adequate volume   Culture   Final    NO GROWTH 3 DAYS Performed at [facility], [address] [place], [place] [address]    Report Status PENDING  Incomplete  C Difficile Quick Screen w PCR reflex     Status: None   Collection Time: 07/19/20  7:36 PM   Specimen: STOOL  Result Value Ref Range Status   C Diff antigen NEGATIVE NEGATIVE Final   C Diff toxin NEGATIVE NEGATIVE Final   C Diff interpretation No C. difficile detected.  Final    Comment: Performed at [facility], [address] [place], [place] [address]     Time coordinating discharge: Over 30 minutes  SIGNED:   Sidney Ace, MD  [facility] 07/22/2020, 1:52 PM Pager   If 7PM-7AM, please contact night-coverage

## 2020-07-22 NOTE — Progress Notes (Signed)
MD order received in [facility] to discharge pt home with home health today; TOC previously had DME (bariatric walker and 3 in 1 bsc delivered to the room for the pt to take home at discharge); Home Health services were recommended however, pt declined at this time; Rx assistance given and provided through the [facility] program, Rxs escribed to [facility] on [address] in [place]; verbally reviewed AVS with pt, no questions voiced at this time; pt's discharge pending arrival of her [facility] the [facility]

## 2020-07-22 NOTE — Discharge Instructions (Signed)
10 Things You Can Do to Manage Your COVID-19 Symptoms at Home If you have possible or confirmed COVID-19: 1. Stay home from work and school. And stay away from other public places. If you must go out, avoid using any kind of public transportation, ridesharing, or taxis. 2. Monitor your symptoms carefully. If your symptoms get worse, call your healthcare provider immediately. 3. Get rest and stay hydrated. 4. If you have a medical appointment, call the healthcare provider ahead of time and tell them that you have or may have COVID-19. 5. For medical emergencies, call 911 and notify the dispatch personnel that you have or may have COVID-19. 6. Cover your cough and sneezes with a tissue or use the inside of your elbow. 7. Wash your hands often with soap and water for at least 20 seconds or clean your hands with an alcohol-based hand sanitizer that contains at least 60% alcohol. 8. As much as possible, stay in a specific room and away from other people in your home. Also, you should use a separate bathroom, if available. If you need to be around other people in or outside of the home, wear a mask. 9. Avoid sharing personal items with other people in your household, like dishes, towels, and bedding. 10. Clean all surfaces that are touched often, like counters, tabletops, and doorknobs. Use household cleaning sprays or wipes according to the label instructions. cdc.gov/coronavirus 04/13/2019 This information is not intended to replace advice given to you by your health care provider. Make sure you discuss any questions you have with your health care provider. Document Revised: 09/15/2019 Document Reviewed: 09/15/2019 Elsevier Patient Education  2020 Elsevier Inc.  COVID-19: How to Protect Yourself and Others Know how it spreads  There is currently no vaccine to prevent coronavirus disease 2019 (COVID-19).  The best way to prevent illness is to avoid being exposed to this virus.  The virus is  thought to spread mainly from person-to-person. ? Between people who are in close contact with one another (within about 6 feet). ? Through respiratory droplets produced when an infected person coughs, sneezes or talks. ? These droplets can land in the mouths or noses of people who are nearby or possibly be inhaled into the lungs. ? COVID-19 may be spread by people who are not showing symptoms. Everyone should Clean your hands often  Wash your hands often with soap and water for at least 20 seconds especially after you have been in a public place, or after blowing your nose, coughing, or sneezing.  If soap and water are not readily available, use a hand sanitizer that contains at least 60% alcohol. Cover all surfaces of your hands and rub them together until they feel dry.  Avoid touching your eyes, nose, and mouth with unwashed hands. Avoid close contact  Limit contact with others as much as possible.  Avoid close contact with people who are sick.  Put distance between yourself and other people. ? Remember that some people without symptoms may be able to spread virus. ? This is especially important for people who are at higher risk of getting very sick.www.cdc.gov/coronavirus/2019-ncov/need-extra-precautions/people-at-higher-risk.html Cover your mouth and nose with a mask when around others  You could spread COVID-19 to others even if you do not feel sick.  Everyone should wear a mask in public settings and when around people not living in their household, especially when social distancing is difficult to maintain. ? Masks should not be placed on young children under age 2, anyone who   has trouble breathing, or is unconscious, incapacitated or otherwise unable to remove the mask without assistance.  The mask is meant to protect other people in case you are infected.  Do NOT use a facemask meant for a Research scientist (physical sciences).  Continue to keep about 6 feet between yourself and others. The  mask is not a substitute for social distancing. Cover coughs and sneezes  Always cover your mouth and nose with a tissue when you cough or sneeze or use the inside of your elbow.  Throw used tissues in the trash.  Immediately wash your hands with soap and water for at least 20 seconds. If soap and water are not readily available, clean your hands with a hand sanitizer that contains at least 60% alcohol. Clean and disinfect  Clean AND disinfect frequently touched surfaces daily. This includes tables, doorknobs, light switches, countertops, handles, desks, phones, keyboards, toilets, faucets, and sinks. ktimeonline.com  If surfaces are dirty, clean them: Use detergent or soap and water prior to disinfection.  Then, use a household disinfectant. You can see a list of EPA-registered household disinfectants here. [place]Flow[employer].co.uk 06/15/2019 This information is not intended to replace advice given to you by your health care provider. Make sure you discuss any questions you have with your health care provider. Document Revised: 06/23/2019 Document Reviewed: 04/21/2019 Elsevier Patient Education  2020 Elsevier Inc.  Diabetes Mellitus and Nutrition, Adult When you have diabetes (diabetes mellitus), it is very important to have healthy eating habits because your blood sugar (glucose) levels are greatly affected by what you eat and drink. Eating healthy foods in the appropriate amounts, at about the same times every day, can help you: Control your blood glucose. Lower your risk of heart disease. Improve your blood pressure. Reach or maintain a healthy weight. Every person with diabetes is different, and each person has different needs for a meal plan. Your health care provider may recommend that you work with a diet and nutrition specialist (dietitian) to make a meal plan that is best for you. Your meal plan may vary depending on  factors such as: The calories you need. The medicines you take. Your weight. Your blood glucose, blood pressure, and cholesterol levels. Your activity level. Other health conditions you have, such as heart or kidney disease. How do carbohydrates affect me? Carbohydrates, also called carbs, affect your blood glucose level more than any other type of food. Eating carbs naturally raises the amount of glucose in your blood. Carb counting is a method for keeping track of how many carbs you eat. Counting carbs is important to keep your blood glucose at a healthy level, especially if you use insulin or take certain oral diabetes medicines. It is important to know how many carbs you can safely have in each meal. This is different for every person. Your dietitian can help you calculate how many carbs you should have at each meal and for each snack. Foods that contain carbs include: Bread, cereal, rice, pasta, and crackers. Potatoes and corn. Peas, beans, and lentils. Milk and yogurt. Fruit and juice. Desserts, such as cakes, cookies, ice cream, and candy. How does alcohol affect me? Alcohol can cause a sudden decrease in blood glucose (hypoglycemia), especially if you use insulin or take certain oral diabetes medicines. Hypoglycemia can be a life-threatening condition. Symptoms of hypoglycemia (sleepiness, dizziness, and confusion) are similar to symptoms of having too much alcohol. If your health care provider says that alcohol is safe for you, follow these guidelines: Limit alcohol intake  to no more than 1 drink per day for nonpregnant women and 2 drinks per day for men. One drink equals 12 oz of beer, 5 oz of wine, or 1 oz of hard liquor. Do not drink on an empty stomach. Keep yourself hydrated with water, diet soda, or unsweetened iced tea. Keep in mind that regular soda, juice, and other mixers may contain a lot of sugar and must be counted as carbs. What are tips for following this  plan?  Reading food labels Start by checking the serving size on the "Nutrition Facts" label of packaged foods and drinks. The amount of calories, carbs, fats, and other nutrients listed on the label is based on one serving of the item. Many items contain more than one serving per package. Check the total grams (g) of carbs in one serving. You can calculate the number of servings of carbs in one serving by dividing the total carbs by 15. For example, if a food has 30 g of total carbs, it would be equal to 2 servings of carbs. Check the number of grams (g) of saturated and trans fats in one serving. Choose foods that have low or no amount of these fats. Check the number of milligrams (mg) of salt (sodium) in one serving. Most people should limit total sodium intake to less than 2,300 mg per day. Always check the nutrition information of foods labeled as "low-fat" or "nonfat". These foods may be higher in added sugar or refined carbs and should be avoided. Talk to your dietitian to identify your daily goals for nutrients listed on the label. Shopping Avoid buying canned, premade, or processed foods. These foods tend to be high in fat, sodium, and added sugar. Shop around the outside edge of the grocery store. This includes fresh fruits and vegetables, bulk grains, fresh meats, and fresh dairy. Cooking Use low-heat cooking methods, such as baking, instead of high-heat cooking methods like deep frying. Cook using healthy oils, such as olive, canola, or sunflower oil. Avoid cooking with butter, cream, or high-fat meats. Meal planning Eat meals and snacks regularly, preferably at the same times every day. Avoid going long periods of time without eating. Eat foods high in fiber, such as fresh fruits, vegetables, beans, and whole grains. Talk to your dietitian about how many servings of carbs you can eat at each meal. Eat 4-6 ounces (oz) of lean protein each day, such as lean meat, chicken, fish, eggs, or  tofu. One oz of lean protein is equal to: 1 oz of meat, chicken, or fish. 1 egg.  cup of tofu. Eat some foods each day that contain healthy fats, such as avocado, nuts, seeds, and fish. Lifestyle Check your blood glucose regularly. Exercise regularly as told by your health care provider. This may include: 150 minutes of moderate-intensity or vigorous-intensity exercise each week. This could be brisk walking, biking, or water aerobics. Stretching and doing strength exercises, such as yoga or weightlifting, at least 2 times a week. Take medicines as told by your health care provider. Do not use any products that contain nicotine or tobacco, such as cigarettes and e-cigarettes. If you need help quitting, ask your health care provider. Work with a Veterinary surgeon or diabetes educator to identify strategies to manage stress and any emotional and social challenges. Questions to ask a health care provider Do I need to meet with a diabetes educator? Do I need to meet with a dietitian? What number can I call if I have questions? When are  the best times to check my blood glucose? Where to find more information: American Diabetes Association: diabetes.org Academy of Nutrition and Dietetics: www.eatright.Dana Corporationorg National Institute of Diabetes and Digestive and Kidney Diseases (NIH): CarFlippers.tnwww.niddk.nih.gov Summary A healthy meal plan will help you control your blood glucose and maintain a healthy lifestyle. Working with a diet and nutrition specialist (dietitian) can help you make a meal plan that is best for you. Keep in mind that carbohydrates (carbs) and alcohol have immediate effects on your blood glucose levels. It is important to count carbs and to use alcohol carefully. This information is not intended to replace advice given to you by your health care provider. Make sure you discuss any questions you have with your health care provider. Document Revised: 09/11/2017 Document Reviewed: 11/03/2016 Elsevier  Patient Education  2020 Elsevier Inc.  Diabetes Basics  Diabetes (diabetes mellitus) is a long-term (chronic) disease. It occurs when the body does not properly use sugar (glucose) that is released from food after you eat. Diabetes may be caused by one or both of these problems:  Your pancreas does not make enough of a hormone called insulin.  Your body does not react in a normal way to insulin that it makes. Insulin lets sugars (glucose) go into cells in your body. This gives you energy. If you have diabetes, sugars cannot get into cells. This causes high blood sugar (hyperglycemia). Follow these instructions at home: How is diabetes treated? You may need to take insulin or other diabetes medicines daily to keep your blood sugar in balance. Take your diabetes medicines every day as told by your doctor. List your diabetes medicines here: Diabetes medicines  Name of medicine: ______________________________ ? Amount (dose): _______________ Time (a.m./p.m.): _______________ Notes: ___________________________________  Name of medicine: ______________________________ ? Amount (dose): _______________ Time (a.m./p.m.): _______________ Notes: ___________________________________  Name of medicine: ______________________________ ? Amount (dose): _______________ Time (a.m./p.m.): _______________ Notes: ___________________________________ If you use insulin, you will learn how to give yourself insulin by injection. You may need to adjust the amount based on the food that you eat. List the types of insulin you use here: Insulin  Insulin type: ______________________________ ? Amount (dose): _______________ Time (a.m./p.m.): _______________ Notes: ___________________________________  Insulin type: ______________________________ ? Amount (dose): _______________ Time (a.m./p.m.): _______________ Notes: ___________________________________  Insulin type: ______________________________ ? Amount  (dose): _______________ Time (a.m./p.m.): _______________ Notes: ___________________________________  Insulin type: ______________________________ ? Amount (dose): _______________ Time (a.m./p.m.): _______________ Notes: ___________________________________  Insulin type: ______________________________ ? Amount (dose): _______________ Time (a.m./p.m.): _______________ Notes: ___________________________________ How do I manage my blood sugar?  Check your blood sugar levels using a blood glucose monitor as directed by your doctor. Your doctor will set treatment goals for you. Generally, you should have these blood sugar levels:  Before meals (preprandial): 80-130 mg/dL (1.6-1.04.4-7.2 mmol/L).  After meals (postprandial): below 180 mg/dL (10 mmol/L).  A1c level: less than 7%. Write down the times that you will check your blood sugar levels: Blood sugar checks  Time: _______________ Notes: ___________________________________  Time: _______________ Notes: ___________________________________  Time: _______________ Notes: ___________________________________  Time: _______________ Notes: ___________________________________  Time: _______________ Notes: ___________________________________  Time: _______________ Notes: ___________________________________  What do I need to know about low blood sugar? Low blood sugar is called hypoglycemia. This is when blood sugar is at or below 70 mg/dL (3.9 mmol/L). Symptoms may include:  Feeling: ? Hungry. ? Worried or nervous (anxious). ? Sweaty and clammy. ? Confused. ? Dizzy. ? Sleepy. ? Sick to your stomach (nauseous).  Having: ? A fast heartbeat. ?  A headache. ? A change in your vision. ? Tingling or no feeling (numbness) around the mouth, lips, or tongue. ? Jerky movements that you cannot control (seizure).  Having trouble with: ? Moving (coordination). ? Sleeping. ? Passing out (fainting). ? Getting upset easily  (irritability). Treating low blood sugar To treat low blood sugar, eat or drink something sugary right away. If you can think clearly and swallow safely, follow the 15:15 rule:  Take 15 grams of a fast-acting carb (carbohydrate). Talk with your doctor about how much you should take.  Some fast-acting carbs are: ? Sugar tablets (glucose pills). Take 3-4 glucose pills. ? 6-8 pieces of hard candy. ? 4-6 oz (120-150 mL) of fruit juice. ? 4-6 oz (120-150 mL) of regular (not diet) soda. ? 1 Tbsp (15 mL) honey or sugar.  Check your blood sugar 15 minutes after you take the carb.  If your blood sugar is still at or below 70 mg/dL (3.9 mmol/L), take 15 grams of a carb again.  If your blood sugar does not go above 70 mg/dL (3.9 mmol/L) after 3 tries, get help right away.  After your blood sugar goes back to normal, eat a meal or a snack within 1 hour. Treating very low blood sugar If your blood sugar is at or below 54 mg/dL (3 mmol/L), you have very low blood sugar (severe hypoglycemia). This is an emergency. Do not wait to see if the symptoms will go away. Get medical help right away. Call your local emergency services (911 in the [place] Do not drive yourself to the hospital. Questions to ask your health care provider  Do I need to meet with a diabetes educator?  What equipment will I need to care for myself at home?  What diabetes medicines do I need? When should I take them?  How often do I need to check my blood sugar?  What number can I call if I have questions?  When is my next doctor's visit?  Where can I find a support group for people with diabetes? Where to find more information  American Diabetes Association: www.diabetes.org  American Association of Diabetes Educators: www.diabeteseducator.org/patient-resources Contact a doctor if:  Your blood sugar is at or above 240 mg/dL (89.2 mmol/L) for 2 days in a row.  You have been sick or have had a fever for 2 days or more,  and you are not getting better.  You have any of these problems for more than 6 hours: ? You cannot eat or drink. ? You feel sick to your stomach (nauseous). ? You throw up (vomit). ? You have watery poop (diarrhea). Get help right away if:  Your blood sugar is lower than 54 mg/dL (3 mmol/L).  You get confused.  You have trouble: ? Thinking clearly. ? Breathing. Summary  Diabetes (diabetes mellitus) is a long-term (chronic) disease. It occurs when the body does not properly use sugar (glucose) that is released from food after digestion.  Take insulin and diabetes medicines as told.  Check your blood sugar every day, as often as told.  Keep all follow-up visits as told by your doctor. This is important. This information is not intended to replace advice given to you by your health care provider. Make sure you discuss any questions you have with your health care provider. Document Revised: 06/22/2019 Document Reviewed: 01/01/2018 Elsevier Patient Education  2020 [employer].    Adapt equipment (Bariatric walker and 3 in 1 bedside commode) Home Health - pt declined

## 2020-07-22 NOTE — TOC Transition Note (Signed)
Transition of Care [facility] - CM/SW Discharge Note   Patient Details  Name: SELENNE COGGIN MRN: [id number] Date of Birth: 1966-02-05  [facility] CM/SW Contact:  Maud Deed, LCSW Phone Number: 07/22/2020, 11:46 AM   Clinical Narrative:    Pt medically stable for discharge per MD. CSW provided pt MATCH letter and $21 dollars petty cash to get her medications from [facility] on [address] Pt has necessary DME from [facility].      Barriers to Discharge: Continued Medical Work up   Patient Goals and CMS Choice        Discharge Placement                       Discharge Plan and Services                DME Arranged: Walker rolling, 3-N-1 (Bariatric Walker and 3 in 1) DME Agency: [facility] Date DME Agency Contacted: 07/20/20 Time DME Agency Contacted: [phone] Representative spoke with at DME Agency: Oletha Cruel HH Arranged: Patient Refused HH          Social Determinants of Health (SDOH) Interventions     Readmission Risk Interventions No flowsheet data found.

## 2020-07-22 NOTE — Progress Notes (Signed)
[name] present at the [facility] entrance for pt discharge; pt discharged via wheelchair by nursing and the orderly to the [facility] entrance

## 2020-07-23 ENCOUNTER — Encounter (INDEPENDENT_AMBULATORY_CARE_PROVIDER_SITE_OTHER): Payer: Self-pay

## 2020-07-24 ENCOUNTER — Encounter (INDEPENDENT_AMBULATORY_CARE_PROVIDER_SITE_OTHER): Payer: Self-pay

## 2020-07-24 LAB — CULTURE, BLOOD (ROUTINE X 2)
Culture: NO GROWTH
Culture: NO GROWTH
Special Requests: ADEQUATE
Special Requests: ADEQUATE

## 2020-07-25 ENCOUNTER — Encounter (INDEPENDENT_AMBULATORY_CARE_PROVIDER_SITE_OTHER): Payer: Self-pay

## 2020-07-26 ENCOUNTER — Telehealth: Payer: Self-pay | Admitting: Pharmacy Technician

## 2020-07-26 NOTE — Telephone Encounter (Signed)
Provided patient with new patient packet to obtain [facility] services.  [facility] must receive requested financial documentation within 30 days in order to determine eligibility and provide medication assistance.  Tyrell Seifer J. Jaqualyn Juday Care Manager [facility]  

## 2020-07-29 ENCOUNTER — Telehealth: Payer: Self-pay

## 2020-07-29 ENCOUNTER — Encounter (INDEPENDENT_AMBULATORY_CARE_PROVIDER_SITE_OTHER): Payer: Self-pay

## 2020-07-29 NOTE — Telephone Encounter (Signed)
Called pt and LM on VM to discuss diarrhea. Sent pt message through MyChart as follows: Hi Diane, I just called a left a message on your voicemail. Here are suggestion to help with your diarrhea: . If diarrhea remains the same:  encourage patient to drink oral fluids and bland foods.  . Avoid alcohol, spicy foods, caffeine or fatty foods that could make diarrhea worse.  . Continue to monitor for signs of dehydration (increased thirst decreased urine output, yellow urine, dry skin, headache or dizziness).  Advise patient to try OTC medication (Imodium, kaopectate, Pepto-Bismol) as per manufacturer's instructions  . If worsening diarrhea occurs and becomes severe (6-7 bowel movements a day): notify PCP  . If diarrhea last greater than 7 days: notify PCP . If signs of dehydration occur (increased thirst, decreased urine output, yellow urine, dry skin, headache or dizziness) advise patient to call 911 and seek treatment in the ED  My call back number is [phone]. Please call me for any further questions. I hope you feel better soon.  Brieanna Nau RN

## 2020-08-02 ENCOUNTER — Telehealth: Payer: Self-pay | Admitting: *Deleted

## 2020-08-02 DIAGNOSIS — E1369 Other specified diabetes mellitus with other specified complication: Secondary | ICD-10-CM

## 2020-08-02 NOTE — Telephone Encounter (Signed)
Contacted pt to discuss her symptoms; the pt states she has been having 2-3 loose stools daily; this has been occurring for the past 3 days; she denies blood in her stool and abdominal pain; pt advised to continue to hydrate and continue to monitor symptoms; the pt says she needs a refill on her test strips; she says her initial scrip was written when she was at [facility] 07/19/20-07/22/20; the pt says she does not have a PCP; the pt states her cbg has decreased to 203; her cbg in the hospital was 400-500 was 400-500 hospital; will place order for community coordination of carefor assistance with obtaining test strips; the pt can be contacted at [phone]; she says a message can be left on her voicemail.

## 2020-08-02 NOTE — Telephone Encounter (Signed)
Attempted to contact pt due to best practice alert from COVID questionnaire on 08/02/20; pt has complaints of new diarrhea; left message on voicemail.

## 2020-08-02 NOTE — Telephone Encounter (Signed)
Called patient to assist with follow up /np appointment.no answer left message to call back. Jasmine Larsen PEC [phone]

## 2020-08-02 NOTE — Addendum Note (Signed)
Addended by: Redmond Baseman on: 08/02/2020 12:12 PM   Modules accepted: Orders

## 2020-08-02 NOTE — Telephone Encounter (Signed)
2nd attempt to contact pt; left message on voicemail. °

## 2020-08-03 ENCOUNTER — Other Ambulatory Visit: Payer: Self-pay

## 2020-08-03 NOTE — Patient Outreach (Signed)
Care Coordination  08/03/2020  Jasmine Larsen 1966-05-12 [id number]  Spoke with patient on this afternoon. Patient has applied for Medicaid multiple times and only qualifies for [facility]. Patient stated she has also applied for disability however she was denied due to not being disabled. Since patient does not have a disability and is only 54, at this time she will not qualify for Medicaid.   Gus Puma, BSW, [facility]  [place]  High Risk Managed Medicaid Team

## 2020-08-03 NOTE — Patient Instructions (Addendum)
Thank you for speaking with me today regarding care management and care coordination needs.    Please contact [facility] at [phone] to set up your new patient appointment. If you have any questions I can be reached at [phone].   Thank you Gus Puma, BSW, [facility]  [place]  High Risk Managed Medicaid Team

## 2020-08-03 NOTE — Telephone Encounter (Signed)
Patient returned my call states she does not have medicaid and is waiting for approval. She will call back to confirm her daughter will be able to bring to the [facility] for a follow up. Rolande Moe PEC [phone]

## 2020-08-24 ENCOUNTER — Other Ambulatory Visit: Payer: Self-pay

## 2020-08-24 NOTE — Patient Outreach (Signed)
Care Coordination  08/24/2020  Jasmine Larsen 1966/01/09 [id number]  BSW provided patient with resources to contact to establish primary care. Patient does not qualify for Medicaid and has not been deemed disabled by a doctor or social security.    Gus Puma, BSW, [facility]  [place]  High Risk Managed Medicaid Team

## 2020-08-27 ENCOUNTER — Other Ambulatory Visit: Payer: Self-pay

## 2020-08-27 NOTE — Patient Instructions (Signed)
Ms. Jasmine Larsen-  Thank you for speaking with me today. As stated before please contact [facility] at [phone] to set up primary care until you qualify for Medicaid or can get social security.   Gus Puma, BSW, [facility]  [place]  High Risk Managed Medicaid Team

## 2020-08-27 NOTE — Patient Outreach (Signed)
Care Coordination  08/27/2020  JAYMI TINNER Jun 13, 1966 [id number]  BSW provided patient with information for [facility] located in [place] [phone]. BSW informed patient that they do accept patients with no income, however a notarized statement from the person that supports her is needed. Patient will also have to complete a new patient application.  Gus Puma, BSW, [facility]  [place]  High Risk Managed Medicaid Team

## 2022-12-08 LAB — HM DIABETES EYE EXAM

## 2022-12-15 ENCOUNTER — Encounter: Payer: Self-pay | Admitting: Nurse Practitioner

## 2022-12-15 ENCOUNTER — Ambulatory Visit: Payer: Medicaid Other | Admitting: Nurse Practitioner

## 2022-12-15 ENCOUNTER — Encounter: Payer: Self-pay | Admitting: *Deleted

## 2022-12-15 ENCOUNTER — Ambulatory Visit (INDEPENDENT_AMBULATORY_CARE_PROVIDER_SITE_OTHER)
Admission: RE | Admit: 2022-12-15 | Discharge: 2022-12-15 | Disposition: A | Discharge status: Home / Self Care | Payer: Medicaid Other | Source: Ambulatory Visit | Attending: Nurse Practitioner | Admitting: Nurse Practitioner

## 2022-12-15 VITALS — BP 120/76 | HR 91 | Temp 98.3°F | Resp 16 | Ht 62.0 in | Wt 351.5 lb

## 2022-12-15 DIAGNOSIS — Z122 Encounter for screening for malignant neoplasm of respiratory organs: Secondary | ICD-10-CM

## 2022-12-15 DIAGNOSIS — R0602 Shortness of breath: Secondary | ICD-10-CM

## 2022-12-15 DIAGNOSIS — I5032 Chronic diastolic (congestive) heart failure: Secondary | ICD-10-CM

## 2022-12-15 DIAGNOSIS — M7989 Other specified soft tissue disorders: Secondary | ICD-10-CM | POA: Insufficient documentation

## 2022-12-15 DIAGNOSIS — R600 Localized edema: Secondary | ICD-10-CM

## 2022-12-15 DIAGNOSIS — M5442 Lumbago with sciatica, left side: Secondary | ICD-10-CM | POA: Insufficient documentation

## 2022-12-15 DIAGNOSIS — Z1231 Encounter for screening mammogram for malignant neoplasm of breast: Secondary | ICD-10-CM

## 2022-12-15 DIAGNOSIS — J449 Chronic obstructive pulmonary disease, unspecified: Secondary | ICD-10-CM

## 2022-12-15 DIAGNOSIS — F32A Depression, unspecified: Secondary | ICD-10-CM

## 2022-12-15 DIAGNOSIS — E1165 Type 2 diabetes mellitus with hyperglycemia: Secondary | ICD-10-CM

## 2022-12-15 DIAGNOSIS — F419 Anxiety disorder, unspecified: Secondary | ICD-10-CM

## 2022-12-15 DIAGNOSIS — Z1211 Encounter for screening for malignant neoplasm of colon: Secondary | ICD-10-CM

## 2022-12-15 LAB — COMPREHENSIVE METABOLIC PANEL
ALT: 18 U/L (ref 0–35)
AST: 9 U/L (ref 0–37)
Albumin: 4.2 g/dL (ref 3.5–5.2)
Alkaline Phosphatase: 137 U/L — ABNORMAL HIGH (ref 39–117)
BUN: 13 mg/dL (ref 6–23)
CO2: 29 mEq/L (ref 19–32)
Calcium: 9.8 mg/dL (ref 8.4–10.5)
Chloride: 96 mEq/L (ref 96–112)
Creatinine, Ser: 0.65 mg/dL (ref 0.40–1.20)
GFR: 99.47 mL/min (ref >60.00)
Glucose, Bld: 209 mg/dL — ABNORMAL HIGH (ref 70–99)
Potassium: 4.4 mEq/L (ref 3.5–5.1)
Sodium: 136 mEq/L (ref 135–145)
Total Bilirubin: 0.4 mg/dL (ref 0.2–1.2)
Total Protein: 7.4 g/dL (ref 6.0–8.3)

## 2022-12-15 LAB — CBC
HCT: 45.7 % (ref 36.0–46.0)
Hemoglobin: 15.1 g/dL — ABNORMAL HIGH (ref 12.0–15.0)
MCHC: 33.1 g/dL (ref 30.0–36.0)
MCV: 86.9 fl (ref 78.0–100.0)
Platelets: 277 10*3/uL (ref 150.0–400.0)
RBC: 5.26 Mil/uL — ABNORMAL HIGH (ref 3.87–5.11)
RDW: 13.9 % (ref 11.5–15.5)
WBC: 9.3 10*3/uL (ref 4.0–10.5)

## 2022-12-15 LAB — BRAIN NATRIURETIC PEPTIDE: Pro B Natriuretic peptide (BNP): 15 pg/mL (ref 0.0–100.0)

## 2022-12-15 LAB — LIPID PANEL
Cholesterol: 246 mg/dL — ABNORMAL HIGH (ref 0–200)
HDL: 41.7 mg/dL (ref >39.00)
LDL Cholesterol: 178 mg/dL — ABNORMAL HIGH (ref 0–99)
NonHDL: 204.79
Total CHOL/HDL Ratio: 6
Triglycerides: 132 mg/dL (ref 0.0–149.0)
VLDL: 26.4 mg/dL (ref 0.0–40.0)

## 2022-12-15 LAB — MICROALBUMIN / CREATININE URINE RATIO
Creatinine,U: 90.8 mg/dL
Microalb Creat Ratio: 2.7 mg/g (ref 0.0–30.0)
Microalb, Ur: 2.5 mg/dL — ABNORMAL HIGH (ref 0.0–1.9)

## 2022-12-15 LAB — TSH: TSH: 3.87 u[IU]/mL (ref 0.35–5.50)

## 2022-12-15 MED ORDER — BLOOD GLUCOSE TEST VI STRP: 1.0000 | ORAL_STRIP | Freq: Two times a day (BID) | 1 refills | Status: DC

## 2022-12-15 MED ORDER — LANCET DEVICE MISC: 1.0000 | 0 refills | Status: AC

## 2022-12-15 MED ORDER — DULOXETINE HCL 30 MG PO CPEP: 30.0000 mg | ORAL_CAPSULE | Freq: Every day | ORAL | 2 refills | Status: DC

## 2022-12-15 MED ORDER — BLOOD GLUCOSE MONITORING SUPPL DEVI: 1.0000 | 0 refills | Status: DC

## 2022-12-15 MED ORDER — LANCETS MISC. MISC: 1.0000 | Freq: Two times a day (BID) | 1 refills | Status: DC

## 2022-12-15 MED ORDER — METFORMIN HCL 500 MG PO TABS: ORAL_TABLET | ORAL | 0 refills | Status: DC

## 2022-12-15 NOTE — Assessment & Plan Note (Addendum)
PHQ-9 and GAD-7 administered in office.  Patient denies HI/SI/AVH.  Will start patient on duloxetine 30 mg nightly follow-up in 6 weeks.  This will hopefully help patient's mood and some of the back discomfort that she is experiencing.  Ambulatory referral to psychology to get patient started in therapy

## 2022-12-15 NOTE — Patient Instructions (Signed)
Nice to see you today I will be in touch with the labs once I have them Follow up with me in 6 weeks, sooner If you need me

## 2022-12-15 NOTE — Assessment & Plan Note (Signed)
Patient has not been on any antidiabetic medications for several years.  Will start patient back on metformin tolerated well in the past titrate her up to 2000 mg daily.

## 2022-12-15 NOTE — Assessment & Plan Note (Signed)
Over the past month.  Tenderness to palpation with positive straight leg raise.  Pending lumbar picture.  Cannot do steroids due to patient's sugar level.  Can consider gabapentin if pain is severe enough we discharge patient on duloxetine which should also be helpful

## 2022-12-15 NOTE — Assessment & Plan Note (Signed)
Noted on right forearm.  Been there for some time per patient and family ember has grown in size and can cause pain that shoots down the arm likely abutting the nerve.  Ultrasound of soft tissue placed today

## 2022-12-15 NOTE — Assessment & Plan Note (Signed)
Patient has not been seen by healthcare provider in several years.  Pending labs today.  May need repeat echo depending on lab results

## 2022-12-15 NOTE — Assessment & Plan Note (Signed)
Historical diagnosis.  Pending chest x-ray today.  Patient is a longtime smoker no current inhaler use at this juncture.  Does endorse shortness of breath

## 2022-12-15 NOTE — Assessment & Plan Note (Signed)
Multifactorial given patient's tobacco use history and heart failure history pending labs and chest x-ray

## 2022-12-15 NOTE — Progress Notes (Signed)
New Patient Office Visit  Subjective    Patient ID: Jasmine Larsen, female    DOB: Apr 09, 1966  Age: 57 y.o. MRN: [id number]  CC:  Chief Complaint  Patient presents with   Establish Care    HPI [name] presents to establish care  DM2: States that she does not have a meter. States that she does not have any diabetic medication for years   Stomach pain: states that she was in the hospital they gave her a short supply of bentyl and zofran. States that she has stomach pain with any food. States if she took it and then eat it helped.  Still have gallbladder   COPD: trouble with breathing at rest and with exertion. States that she has not been on a mantaintce   CHF: has not seen a cardiologist in the past few years.   Back pian: states that she has had pain in the back and it is going to the knee. States that the back has been going onfor approx a  month, has been using lidocaine, tylenol and ibuoprofen. Not getting good relief   Mass; on the right arm. States that it has been there for years and has grown. States pain ful to the touch   Tdap: needs updating PNA: needs prevnar 20 Flu: refused Covid:  Colonoscopy: [facility]  Pap smear: over 5 years ago  Mammogram: Needs updating  [facility]    Eye: 12/08/2022.      Outpatient Encounter Medications as of 12/15/2022  Medication Sig   albuterol (VENTOLIN HFA) 108 (90 Base) MCG/ACT inhaler Inhale 2 puffs into the lungs every 6 (six) hours as needed for wheezing or shortness of breath.   DULoxetine (CYMBALTA) 30 MG capsule Take 1 capsule (30 mg total) by mouth daily.   metFORMIN (GLUCOPHAGE) 500 MG tablet 1 tab ('500mg'$ ) QAM for a week then 1 tab ('500mg'$ ) BID for a week then 2 tabs ('1000mg'$ ) QAM and 1 tab ('500mg'$ ) QPM then 2 tabs ('1000mg'$ ) BID   ondansetron (ZOFRAN) 4 MG tablet Take 1 tablet (4 mg total) by mouth every 6 (six) hours as needed for nausea or vomiting.   blood glucose meter kit and supplies KIT Dispense based on  patient and insurance preference. Use up to four times daily as directed. (FOR ICD-9 250.00, 250.01). (Patient not taking: Reported on 12/15/2022)   dicyclomine (BENTYL) 20 MG tablet Take 1 tablet (20 mg total) by mouth 4 (four) times daily -  before meals and at bedtime for 14 days.   glipiZIDE (GLUCOTROL) 5 MG tablet Take 1 tablet (5 mg total) by mouth 2 (two) times daily.   [DISCONTINUED] metFORMIN (GLUCOPHAGE) 500 MG tablet Take 1 tablet (500 mg total) by mouth 2 (two) times daily with a meal.   No facility-administered encounter medications on file as of 12/15/2022.    Past Medical History:  Diagnosis Date   Asthma    CHF (congestive heart failure) (HCC)    COPD (chronic obstructive pulmonary disease) (HCC)    Diabetes mellitus without complication ([place])    Miscarriage    Multiple gastric ulcers     Past Surgical History:  Procedure Laterality Date   CESAREAN SECTION     x3    Family History  Problem Relation Age of Onset   Asthma Mother    Diabetes Mother    COPD Mother    Heart Problems Mother    Diabetes Brother    COPD Brother    Congestive Heart Failure  Brother    Alcohol abuse Maternal Grandmother    Tuberculosis Maternal Grandmother    Lung cancer Maternal Grandmother     Social History   Socioeconomic History   Marital status: Divorced    Spouse name: Not on file   Number of children: 3   Years of education: Not on file   Highest education level: Not on file  Occupational History   Not on file  Tobacco Use   Smoking status: Every Day    Packs/day: 2.00    Years: 40.00    Total pack years: 80.00    Types: Cigarettes   Smokeless tobacco: Never  Vaping Use   Vaping Use: Never used  Substance and Sexual Activity   Alcohol use: No   Drug use: No   Sexual activity: Not Currently  Other Topics Concern   Not on file  Social History Narrative   Disable      Curtis )35-36)   Vinnie Level ( 33)   Jacob Moores (31)      Hobbies: none    Social Determinants of  Health   Financial Resource Strain: Not on file  Food Insecurity: Not on file  Transportation Needs: Not on file  Physical Activity: Not on file  Stress: Not on file  Social Connections: Not on file  Intimate Partner Violence: Not on file    Review of Systems  Constitutional:  Negative for chills and fever.  Respiratory:  Positive for shortness of breath.   Cardiovascular:  Positive for leg swelling. Negative for chest pain.  Gastrointestinal:  Positive for abdominal pain.  Neurological:  Negative for headaches.  Psychiatric/Behavioral:  Negative for hallucinations and suicidal ideas. The patient has insomnia.         Objective    BP 120/76   Pulse 91   Temp 98.3 F (36.8 C)   Resp 16   Ht '5\' 2"'$  (1.575 m)   Wt (!) 351 lb 8 oz (159.4 kg)   SpO2 98%   BMI 64.29 kg/m   Physical Exam Vitals and nursing note reviewed.  Constitutional:      Appearance: Normal appearance. She is obese.  HENT:     Right Ear: Tympanic membrane, ear canal and external ear normal.     Left Ear: Tympanic membrane, ear canal and external ear normal.     Mouth/Throat:     Mouth: Mucous membranes are moist.     Pharynx: Oropharynx is clear.  Eyes:     Extraocular Movements: Extraocular movements intact.     Pupils: Pupils are equal, round, and reactive to light.  Cardiovascular:     Rate and Rhythm: Normal rate and regular rhythm.     Pulses: Normal pulses.     Heart sounds: Normal heart sounds.  Pulmonary:     Effort: Pulmonary effort is normal.     Breath sounds: Normal breath sounds.  Musculoskeletal:     Lumbar back: Bony tenderness present. No tenderness. Positive right straight leg raise test. Negative left straight leg raise test.     Right lower leg: No edema.     Left lower leg: No edema.  Lymphadenopathy:     Cervical: No cervical adenopathy.  Skin:    General: Skin is warm.       Neurological:     General: No focal deficit present.     Mental Status: She is alert.      Comments: Bilateral upper and lower extremity strength 5/5  Psychiatric:  Mood and Affect: Mood normal.        Behavior: Behavior normal.        Thought Content: Thought content normal.        Judgment: Judgment normal.    Diabetic Foot Form - Detailed   Diabetic Foot Exam - detailed Diabetic Foot exam was performed with the following findings: Yes   Is there swelling or and abnormal foot shape?: No Is there a claw toe deformity?: No Is there elevated skin temparature?: No Pulse Foot Exam completed.: Yes   Right posterior Tibialias: Present Left posterior Tibialias: Present   Right Dorsalis Pedis: Present Left Dorsalis Pedis: Present  Sensory Foot Exam Completed.: Yes Semmes-Weinstein Monofilament Test   Comments: All 10 sites bilaterally sensation intact  Does have a callus to the left medial great toe          Assessment & Plan:   Problem List Items Addressed This Visit       Cardiovascular and Mediastinum   Chronic diastolic CHF (congestive heart failure) ([place])    Patient has not been seen by healthcare provider in several years.  Pending labs today.  May need repeat echo depending on lab results      Relevant Orders   DG Chest 2 View (Completed)   Brain natriuretic peptide     Respiratory   COPD (chronic obstructive pulmonary disease) ([place])    Historical diagnosis.  Pending chest x-ray today.  Patient is a longtime smoker no current inhaler use at this juncture.  Does endorse shortness of breath        Endocrine   Uncontrolled type 2 diabetes mellitus with hyperglycemia ([place]) - Primary    Patient has not been on any antidiabetic medications for several years.  Will start patient back on metformin tolerated well in the past titrate her up to 2000 mg daily.      Relevant Medications   metFORMIN (GLUCOPHAGE) 500 MG tablet   Other Relevant Orders   CBC   Comprehensive metabolic panel   Lipid panel   TSH   Microalbumin / creatinine urine ratio      Nervous and Auditory   Acute midline low back pain with left-sided sciatica    Over the past month.  Tenderness to palpation with positive straight leg raise.  Pending lumbar picture.  Cannot do steroids due to patient's sugar level.  Can consider gabapentin if pain is severe enough we discharge patient on duloxetine which should also be helpful      Relevant Medications   DULoxetine (CYMBALTA) 30 MG capsule   Other Relevant Orders   DG Lumbar Spine Complete     Other   Shortness of breath    Multifactorial given patient's tobacco use history and heart failure history pending labs and chest x-ray      Relevant Orders   DG Chest 2 View (Completed)   Lower extremity edema    Multifactorial.  Pending labs      Soft tissue mass    Noted on right forearm.  Been there for some time per patient and family ember has grown in size and can cause pain that shoots down the arm likely abutting the nerve.  Ultrasound of soft tissue placed today      Relevant Orders   US SOFT TISSUE RT UPPER EXTREMITY LTD (NON-VASCULAR)   Anxiety and depression    PHQ-9 and GAD-7 administered in office.  Patient denies HI/SI/AVH.  Will start patient on duloxetine 30 mg nightly follow-up  in 6 weeks.  This will hopefully help patient's mood and some of the back discomfort that she is experiencing.  Ambulatory referral to psychology to get patient started in therapy      Relevant Medications   DULoxetine (CYMBALTA) 30 MG capsule   Other Relevant Orders   Ambulatory referral to Psychology   Other Visit Diagnoses     Screening for colon cancer       Relevant Orders   Ambulatory referral to Gastroenterology   Screening mammogram for breast cancer       Relevant Orders   MM 3D SCREEN BREAST BILATERAL   Ambulatory referral to Gynecology   Screening for lung cancer       Relevant Orders   Ambulatory Referral Lung Cancer Screening [facility]       Return in about 6 weeks (around 01/26/2023) for  MDD/GAD recheck .   Romilda Garret, NP

## 2022-12-15 NOTE — Assessment & Plan Note (Signed)
Multifactorial.  Pending labs

## 2022-12-16 ENCOUNTER — Other Ambulatory Visit: Payer: Self-pay | Admitting: Nurse Practitioner

## 2022-12-16 DIAGNOSIS — R0602 Shortness of breath: Secondary | ICD-10-CM

## 2022-12-16 DIAGNOSIS — E785 Hyperlipidemia, unspecified: Secondary | ICD-10-CM

## 2022-12-16 MED ORDER — ROSUVASTATIN CALCIUM 5 MG PO TABS: 5.0000 mg | ORAL_TABLET | Freq: Every day | ORAL | 0 refills | Status: DC

## 2022-12-16 MED ORDER — ADVAIR DISKUS 250-50 MCG/ACT IN AEPB: 1.0000 | INHALATION_SPRAY | Freq: Two times a day (BID) | RESPIRATORY_TRACT | 0 refills | Status: DC

## 2022-12-18 ENCOUNTER — Other Ambulatory Visit: Payer: Self-pay | Admitting: Nurse Practitioner

## 2022-12-18 DIAGNOSIS — F32A Depression, unspecified: Secondary | ICD-10-CM

## 2022-12-19 ENCOUNTER — Encounter: Payer: Self-pay | Admitting: *Deleted

## 2022-12-22 ENCOUNTER — Encounter: Payer: Self-pay | Admitting: Nurse Practitioner

## 2022-12-23 ENCOUNTER — Encounter: Payer: Self-pay | Admitting: *Deleted

## 2022-12-29 ENCOUNTER — Telehealth: Payer: Self-pay

## 2022-12-29 ENCOUNTER — Encounter: Payer: Self-pay | Admitting: Obstetrics and Gynecology

## 2022-12-29 NOTE — Telephone Encounter (Signed)
Left message to return call to our office.  

## 2023-01-02 ENCOUNTER — Telehealth: Payer: Self-pay

## 2023-01-02 ENCOUNTER — Other Ambulatory Visit: Payer: Self-pay | Admitting: *Deleted

## 2023-01-02 DIAGNOSIS — F17210 Nicotine dependence, cigarettes, uncomplicated: Secondary | ICD-10-CM

## 2023-01-02 DIAGNOSIS — Z122 Encounter for screening for malignant neoplasm of respiratory organs: Secondary | ICD-10-CM

## 2023-01-02 DIAGNOSIS — Z1231 Encounter for screening mammogram for malignant neoplasm of breast: Secondary | ICD-10-CM

## 2023-01-02 DIAGNOSIS — Z87891 Personal history of nicotine dependence: Secondary | ICD-10-CM

## 2023-01-02 NOTE — Telephone Encounter (Signed)
The order for mammogram has been placed at [facility]. I do not place a referral for mammograms. Not sure why it was associated to GYN but that is a mistake. They should be able to call [facility] and get it setup  They have the lung cancer screening CT scan scheduled and it is in [place] at the outpatient imaging center

## 2023-01-02 NOTE — Telephone Encounter (Signed)
I informed her that the medication can take 6-8 weeks before having a full effect

## 2023-01-02 NOTE — Telephone Encounter (Signed)
Pt daughter called in and stated that she needed a new referral for mammo because [facility] does not do it. She also need to make sure the lung cancer screening is none in [place] and not in [place].

## 2023-01-02 NOTE — Telephone Encounter (Signed)
Called and gave her the information. Her daughter says the medication you put her on for her back and emotions is not working, and making her emotions worse. She is more short tempered now then when she started the medication.

## 2023-01-05 NOTE — Telephone Encounter (Signed)
Spoke to pt daughter and she made her mom stop taking it due to talking suicidal.

## 2023-01-06 ENCOUNTER — Encounter: Payer: Self-pay | Admitting: Nurse Practitioner

## 2023-01-06 NOTE — Telephone Encounter (Signed)
Agree with stopping the cymbalta medication. Is she still having thoughts of wanting to harm herself. If so she needs to be evaluated at the behavioral health urgent care

## 2023-01-06 NOTE — Telephone Encounter (Signed)
Talk to daughter and she said she is not sure because she has not talked to her mom this am. Last night was day 2 of being off of the medication. She said she would talk to her mom in a little bit and send [place] a myChart message, and if need be I will send the address to behavior health UC.

## 2023-01-06 NOTE — Addendum Note (Signed)
Addended by: Michela Pitcher on: 01/06/2023 07:35 AM   Modules accepted: Orders

## 2023-01-18 ENCOUNTER — Other Ambulatory Visit: Payer: Self-pay | Admitting: Nurse Practitioner

## 2023-01-18 DIAGNOSIS — E1165 Type 2 diabetes mellitus with hyperglycemia: Secondary | ICD-10-CM

## 2023-01-19 ENCOUNTER — Encounter: Payer: Self-pay | Admitting: Nurse Practitioner

## 2023-01-19 ENCOUNTER — Other Ambulatory Visit: Payer: Self-pay | Admitting: Nurse Practitioner

## 2023-01-19 DIAGNOSIS — E1165 Type 2 diabetes mellitus with hyperglycemia: Secondary | ICD-10-CM

## 2023-01-19 MED ORDER — METFORMIN HCL 500 MG PO TABS: 1000.0000 mg | ORAL_TABLET | Freq: Two times a day (BID) | ORAL | 0 refills | Status: DC

## 2023-01-26 ENCOUNTER — Ambulatory Visit (INDEPENDENT_AMBULATORY_CARE_PROVIDER_SITE_OTHER): Payer: Medicare Other | Admitting: Nurse Practitioner

## 2023-01-26 ENCOUNTER — Encounter: Payer: Self-pay | Admitting: *Deleted

## 2023-01-26 ENCOUNTER — Encounter: Payer: Self-pay | Admitting: Nurse Practitioner

## 2023-01-26 VITALS — BP 124/70 | HR 88 | Temp 98.3°F | Resp 16 | Wt 345.4 lb

## 2023-01-26 DIAGNOSIS — M25512 Pain in left shoulder: Secondary | ICD-10-CM | POA: Diagnosis not present

## 2023-01-26 DIAGNOSIS — J4489 Other specified chronic obstructive pulmonary disease: Secondary | ICD-10-CM

## 2023-01-26 DIAGNOSIS — E1165 Type 2 diabetes mellitus with hyperglycemia: Secondary | ICD-10-CM

## 2023-01-26 DIAGNOSIS — R0602 Shortness of breath: Secondary | ICD-10-CM

## 2023-01-26 DIAGNOSIS — M5442 Lumbago with sciatica, left side: Secondary | ICD-10-CM

## 2023-01-26 MED ORDER — DICYCLOMINE HCL 20 MG PO TABS: 20.0000 mg | ORAL_TABLET | Freq: Three times a day (TID) | ORAL | 0 refills | Status: DC

## 2023-01-26 MED ORDER — ALBUTEROL SULFATE HFA 108 (90 BASE) MCG/ACT IN AERS: 2.0000 | INHALATION_SPRAY | Freq: Four times a day (QID) | RESPIRATORY_TRACT | 1 refills | Status: DC | PRN

## 2023-01-26 MED ORDER — ONDANSETRON HCL 4 MG PO TABS: 4.0000 mg | ORAL_TABLET | Freq: Four times a day (QID) | ORAL | 0 refills | Status: DC | PRN

## 2023-01-26 NOTE — Patient Instructions (Signed)
Nice to see you today Follow up with me in 6-8 weeks for a diabetes recheck Continue working on your diet

## 2023-01-26 NOTE — Progress Notes (Signed)
Established Patient Office Visit  Subjective   Patient ID: Jasmine Larsen, female    DOB: [birth year]  Age: 57 y.o. MRN: [id number]  Chief Complaint  Patient presents with   Anxiety     DM2: Patient was placed on metformin at last office visit with the goal titration of 1000 mg twice daily.  Patient's last A1c was.  I have asked her to check her glucose every day.  Supplies were sent in.  States that she has been checking her glucose once a week. States that It has been 200-250. States that she is taking the sugar  States that her sugar was 263 (checked it in office) and did have some mtn dew this morning per her report.   MDD/anxiety: Patient was started on duloxetine 30 mg daily.  She did have adverse drug events inclusive of falls wanting to harm or self.  Medication was discontinued was trying to use this medication to help with the lower back pain and some of the major depressive disorder.  She was referred to therapy States that she is getting out of the house and doing more thing. States that she did go to the carnival with her grandkids and had a good time    Back pain: Did do a lumbar spine picture last office visit that showed mild multilevel degenerative disc disease.  Patient was unable to tolerate duloxetine.  Patient is morbidly obese.  Breathing: states that she is using the advair day and night the cough has improved some. She is still needing to use her rescue inhaler       01/26/2023    8:43 AM 12/15/2022    9:16 AM  PHQ9 SCORE ONLY  PHQ-9 Total Score 22 18       01/26/2023    8:43 AM 12/15/2022    9:17 AM  GAD 7 : Generalized Anxiety Score  Nervous, Anxious, on Edge 3 3  Control/stop worrying 3 3  Worry too much - different things 3 3  Trouble relaxing 3 3  Restless 1 3  Easily annoyed or irritable 3 3  Afraid - awful might happen 3 3  Total GAD 7 Score 19 21  Anxiety Difficulty Very difficult Very difficult        Review of Systems  Constitutional:   Negative for chills and fever.  Respiratory:  Positive for shortness of breath.   Cardiovascular:  Positive for leg swelling. Negative for chest pain.  Neurological:  Negative for headaches.  Psychiatric/Behavioral:  Negative for hallucinations and suicidal ideas.       Objective:     BP 124/70   Pulse 88   Temp 98.3 F (36.8 C)   Resp 16   Wt (!) 345 lb 6 oz (156.7 kg)   SpO2 96%   BMI 63.17 kg/m  BP Readings from Last 3 Encounters:  01/26/23 124/70  12/15/22 120/76  07/22/20 118/64   Wt Readings from Last 3 Encounters:  01/26/23 (!) 345 lb 6 oz (156.7 kg)  12/15/22 (!) 351 lb 8 oz (159.4 kg)  07/19/20 (!) 360 lb (163.3 kg)      Physical Exam Vitals and nursing note reviewed.  Constitutional:      Appearance: Normal appearance.  Cardiovascular:     Rate and Rhythm: Normal rate and regular rhythm.     Heart sounds: Normal heart sounds.  Pulmonary:     Effort: Pulmonary effort is normal.     Breath sounds: Normal breath sounds.  Musculoskeletal:  General: Tenderness present.       Arms:     Right lower leg: Edema present.     Left lower leg: Edema present.     Comments: Limited Abduction Limited anterior ROM   Neurological:     Mental Status: She is alert.     Deep Tendon Reflexes:     Reflex Scores:      Bicep reflexes are 2+ on the right side and 2+ on the left side.    Comments: Bilateral upper extremity strength 5/5.      No results found for any visits on 01/26/23.    The 10-year ASCVD risk score (Arnett DK, et al., 2019) is: 13.6%    Assessment & Plan:   Problem List Items Addressed This Visit       Respiratory   COPD with asthma - Primary    Patient currently on Advair and albuterol inhaler as needed.  Some improvement with her symptoms.  Patient has also lost weight which may be beneficial with dyspnea on exertion.  Continue Advair and albuterol as prescribed      Relevant Medications   albuterol (VENTOLIN HFA) 108 (90 Base)  MCG/ACT inhaler     Endocrine   Uncontrolled type 2 diabetes mellitus with hyperglycemia    Patient has reached 1000 mg of metformin twice daily.  States she is tolerating it well did have some difficulty with diarrhea in the beginning.  Glucose 263 in office nonfasting on patient's machine.  Continue working on lifestyle modifications and taking metformin as prescribed.  Patient has lost weight praise given        Nervous and Auditory   Acute midline low back pain with left-sided sciatica    No improvement she is working on losing weight will refer to orthopedist      Relevant Orders   Ambulatory referral to Orthopedic Surgery     Other   Shortness of breath    Some improvement with inhaler use.  Continue working lifestyle modifications inclusive of losing weight and inhalers as prescribed      Relevant Medications   albuterol (VENTOLIN HFA) 108 (90 Base) MCG/ACT inhaler   Acute pain of left shoulder    Left shoulder pain likely some rotator cuff involvement.  Ortho referral placed today      Relevant Orders   Ambulatory referral to Orthopedic Surgery    Return in about 6 weeks (around 03/09/2023) for DM recheck.    Audria Nine, NP

## 2023-01-26 NOTE — Assessment & Plan Note (Signed)
Patient has reached 1000 mg of metformin twice daily.  States she is tolerating it well did have some difficulty with diarrhea in the beginning.  Glucose 263 in office nonfasting on patient's machine.  Continue working on lifestyle modifications and taking metformin as prescribed.  Patient has lost weight praise given

## 2023-01-26 NOTE — Assessment & Plan Note (Signed)
No improvement she is working on losing weight will refer to orthopedist

## 2023-01-26 NOTE — Assessment & Plan Note (Signed)
Some improvement with inhaler use.  Continue working lifestyle modifications inclusive of losing weight and inhalers as prescribed

## 2023-01-26 NOTE — Assessment & Plan Note (Signed)
Patient currently on Advair and albuterol inhaler as needed.  Some improvement with her symptoms.  Patient has also lost weight which may be beneficial with dyspnea on exertion.  Continue Advair and albuterol as prescribed

## 2023-01-26 NOTE — Assessment & Plan Note (Signed)
Left shoulder pain likely some rotator cuff involvement.  Ortho referral placed today

## 2023-01-29 ENCOUNTER — Encounter: Payer: Self-pay | Admitting: Nurse Practitioner

## 2023-01-29 ENCOUNTER — Other Ambulatory Visit: Payer: Self-pay | Admitting: Nurse Practitioner

## 2023-01-29 DIAGNOSIS — E1165 Type 2 diabetes mellitus with hyperglycemia: Secondary | ICD-10-CM

## 2023-01-29 MED ORDER — LANCETS MISC. MISC: 1.0000 | Freq: Two times a day (BID) | 3 refills | Status: DC

## 2023-01-29 MED ORDER — BLOOD GLUCOSE TEST VI STRP: 1.0000 | ORAL_STRIP | Freq: Two times a day (BID) | 3 refills | Status: DC

## 2023-02-03 ENCOUNTER — Ambulatory Visit
Admission: RE | Admit: 2023-02-03 | Discharge: 2023-02-03 | Disposition: A | Discharge status: Home / Self Care | Payer: Medicare Other | Source: Ambulatory Visit | Attending: Nurse Practitioner | Admitting: Nurse Practitioner

## 2023-02-03 ENCOUNTER — Ambulatory Visit (INDEPENDENT_AMBULATORY_CARE_PROVIDER_SITE_OTHER): Payer: Medicare Other | Admitting: Obstetrics & Gynecology

## 2023-02-03 ENCOUNTER — Encounter: Payer: Self-pay | Admitting: Obstetrics & Gynecology

## 2023-02-03 ENCOUNTER — Other Ambulatory Visit (HOSPITAL_COMMUNITY)
Admission: RE | Admit: 2023-02-03 | Discharge: 2023-02-03 | Disposition: A | Discharge status: Home / Self Care | Payer: Medicare Other | Source: Ambulatory Visit | Attending: Obstetrics & Gynecology | Admitting: Obstetrics & Gynecology

## 2023-02-03 VITALS — BP 168/81 | HR 98 | Ht 62.0 in | Wt 349.0 lb

## 2023-02-03 DIAGNOSIS — N9089 Other specified noninflammatory disorders of vulva and perineum: Secondary | ICD-10-CM

## 2023-02-03 DIAGNOSIS — Z1151 Encounter for screening for human papillomavirus (HPV): Secondary | ICD-10-CM | POA: Insufficient documentation

## 2023-02-03 DIAGNOSIS — M7989 Other specified soft tissue disorders: Secondary | ICD-10-CM | POA: Insufficient documentation

## 2023-02-03 DIAGNOSIS — Z1231 Encounter for screening mammogram for malignant neoplasm of breast: Secondary | ICD-10-CM

## 2023-02-03 DIAGNOSIS — Z124 Encounter for screening for malignant neoplasm of cervix: Secondary | ICD-10-CM | POA: Insufficient documentation

## 2023-02-03 DIAGNOSIS — N950 Postmenopausal bleeding: Secondary | ICD-10-CM

## 2023-02-03 DIAGNOSIS — Z01419 Encounter for gynecological examination (general) (routine) without abnormal findings: Secondary | ICD-10-CM | POA: Insufficient documentation

## 2023-02-03 MED ORDER — MISOPROSTOL 200 MCG PO TABS: ORAL_TABLET | ORAL | 0 refills | Status: DC

## 2023-02-03 NOTE — Progress Notes (Signed)
ANNUAL PREVENTATIVE CARE GYNECOLOGY  ENCOUNTER NOTE  Subjective:       Jasmine Larsen is a 57 y.o. widowed [phone] here for a routine annual gynecologic exam. The patient is not sexually active. The patient is not taking hormone replacement therapy. Patient denies post-menopausal vaginal bleeding. Patient reports post-menopausal vaginal bleeding. She went through menopause in 2012 and restarted bleeding in 2020. She has not been evaluated for this until now. She has not had a pap smear in 33 years. She has a h/o genital warts and her mom had vulvar cancer.  The patient wears seatbelts: yes. The patient participates in regular exercise: no. Has the patient ever been transfused or tattooed?: yes. The patient reports that there is not domestic violence in her life.    Gynecologic History No LMP recorded. Patient is postmenopausal. Contraception: abstinence since 2011   Obstetric History OB History  Gravida Para Term Preterm AB Living  SAB IAB Ectopic Multiple Live Births  1            # Outcome Date GA Lbr Len/2nd Weight Sex Delivery Anes PTL Lv  4 SAB           3 Term      CS-Unspec     2 Term      CS-Unspec     1 Term      CS-Unspec       Past Medical History:  Diagnosis Date   Asthma    CHF (congestive heart failure)    COPD (chronic obstructive pulmonary disease)    Diabetes mellitus without complication    Miscarriage    Multiple gastric ulcers     Family History  Problem Relation Age of Onset   Asthma Mother    Diabetes Mother    COPD Mother    Heart Problems Mother    Diabetes Brother    COPD Brother    Congestive Heart Failure Brother    Alcohol abuse Maternal Grandmother    Tuberculosis Maternal Grandmother    Lung cancer Maternal Grandmother     Past Surgical History:  Procedure Laterality Date   CESAREAN SECTION     x3    Social History   Socioeconomic History   Marital status: Divorced    Spouse name: Not on file   Number of  children: 3   Years of education: Not on file   Highest education level: Not on file  Occupational History   Not on file  Tobacco Use   Smoking status: Every Day    Packs/day: 2.00    Years: 40.00    Additional pack years: 0.00    Total pack years: 80.00    Types: Cigarettes   Smokeless tobacco: Never  Vaping Use   Vaping Use: Never used  Substance and Sexual Activity   Alcohol use: No   Drug use: No   Sexual activity: Not Currently  Other Topics Concern   Not on file  Social History Narrative   Disable      Curtis )35-36)   Rosalita Chessman ( 35)   Jamesetta Orleans (33)      Hobbies: none    Social Determinants of Health   Financial Resource Strain: Not on file  Food Insecurity: Not on file  Transportation Needs: Not on file  Physical Activity: Not on file  Stress: Not on file  Social Connections: Not on file  Intimate Partner Violence: Not on file  Current Outpatient Medications on File Prior to Visit  Medication Sig Dispense Refill   albuterol (VENTOLIN HFA) 108 (90 Base) MCG/ACT inhaler Inhale 2 puffs into the lungs every 6 (six) hours as needed for wheezing or shortness of breath. 8 g 1   blood glucose meter kit and supplies KIT Dispense based on patient and insurance preference. Use up to four times daily as directed. (FOR ICD-9 250.00, 250.01). 1 each 0   Blood Glucose Monitoring Suppl DEVI 1 each by Does not apply route as directed. May substitute to any manufacturer covered by patient's insurance. 1 each 0   dicyclomine (BENTYL) 20 MG tablet Take 1 tablet (20 mg total) by mouth 4 (four) times daily -  before meals and at bedtime for 14 days. 56 tablet 0   fluticasone-salmeterol (ADVAIR DISKUS) 250-50 MCG/ACT AEPB Inhale 1 puff into the lungs in the morning and at bedtime. 180 each 0   Glucose Blood (BLOOD GLUCOSE TEST STRIPS) STRP 1 each by In Vitro route 2 (two) times daily. May substitute to any manufacturer covered by patient's insurance. 100 strip 3   Lancets Misc. MISC 1  each by Does not apply route 2 (two) times daily. May substitute to any manufacturer covered by patient's insurance. 100 each 3   metFORMIN (GLUCOPHAGE) 500 MG tablet Take 2 tablets (1,000 mg total) by mouth 2 (two) times daily with a meal. 360 tablet 0   ondansetron (ZOFRAN) 4 MG tablet Take 1 tablet (4 mg total) by mouth every 6 (six) hours as needed for nausea or vomiting. 20 tablet 0   rosuvastatin (CRESTOR) 5 MG tablet Take 1 tablet (5 mg total) by mouth daily. 90 tablet 0   No current facility-administered medications on file prior to visit.    Allergies  Allergen Reactions   Cymbalta [Duloxetine Hcl]     SI thoughts      Review of Systems ROS Review of Systems - General ROS: negative for - chills, fatigue, fever, hot flashes, night sweats, weight gain or weight loss Psychological ROS: negative for - anxiety, decreased libido, depression, mood swings, physical abuse or sexual abuse Ophthalmic ROS: negative for - blurry vision, eye pain or loss of vision ENT ROS: negative for - headaches, hearing change, visual changes or vocal changes Allergy and Immunology ROS: negative for - hives, itchy/watery eyes or seasonal allergies Hematological and Lymphatic ROS: negative for - bleeding problems, bruising, swollen lymph nodes or weight loss Endocrine ROS: negative for - galactorrhea, hair pattern changes, hot flashes, malaise/lethargy, mood swings, palpitations, polydipsia/polyuria, skin changes, temperature intolerance or unexpected weight changes Breast ROS: negative for - new or changing breast lumps or nipple discharge Respiratory ROS: negative for - cough or shortness of breath Cardiovascular ROS: negative for - chest pain, irregular heartbeat, palpitations or shortness of breath Gastrointestinal ROS: no abdominal pain, change in bowel habits, or black or bloody stools Genito-Urinary ROS: no dysuria, trouble voiding, or hematuria Musculoskeletal ROS: negative for - joint pain or  joint stiffness Neurological ROS: negative for - bowel and bladder control changes Dermatological ROS: negative for rash and skin lesion changes   Objective:   BP (!) 168/81   Pulse 98   Ht  (1.575 m)   Wt (!) 349 lb (158.3 kg)   BMI 63.83 kg/m  CONSTITUTIONAL: Well-developed, well-nourished female in no acute distress.  PSYCHIATRIC: Normal mood and affect. Normal behavior. Normal judgment and thought content. NEUROLGIC: Alert and oriented to person, place, and time. Normal muscle tone coordination.  No cranial nerve deficit noted. HENT:  Normocephalic, atraumatic, External right and left ear normal. Oropharynx is clear and moist EYES: Conjunctivae and EOM are normal. Pupils are equal, round, and reactive to light. No scleral icterus.  NECK: Normal range of motion, supple, no masses.  Normal thyroid.  SKIN: Skin is warm and dry. No rash noted. Not diaphoretic. No erythema. No pallor. CARDIOVASCULAR: Normal heart rate noted, regular rhythm, no murmur. RESPIRATORY: Clear to auscultation bilaterally. Effort and breath sounds normal, no problems with respiration noted. BREASTS: Symmetric in size. No masses, skin changes, nipple drainage, or lymphadenopathy. ABDOMEN: Soft, normal bowel sounds, no distention noted.  No tenderness, rebound or guarding.   MUSCULOSKELETAL: Normal range of motion. No tenderness.  No cyanosis, clubbing, or edema.  2+ distal pulses. LYMPHATIC: No Axillary, Supraclavicular, or Inguinal Adenopathy.  EG: marked skin changes extending from vulva to groin (? Vulvar dysplasia versus changes c/w morbid obesity, uncontrolled DM)  I used a long Pederson spec and was able to visualize her nulliparous stenotic cervix. I obtained a pap smear with the broom. I sprayed the cervix with lidocaine and attempted to dilate it in order to get an [facility] but could not dilate the cervix.  Labs: Lab Results  Component Value Date   WBC 9.3 12/15/2022   HGB 15.1 (H) 12/15/2022   HCT  45.7 12/15/2022   MCV 86.9 12/15/2022   PLT 277.0 12/15/2022    Lab Results  Component Value Date   CREATININE 0.65 12/15/2022   BUN 13 12/15/2022   NA 136 12/15/2022   K 4.4 12/15/2022   CL 96 12/15/2022   CO2 29 12/15/2022    Lab Results  Component Value Date   ALT 18 12/15/2022   AST 9 12/15/2022   ALKPHOS 137 (H) 12/15/2022   BILITOT 0.4 12/15/2022    Lab Results  Component Value Date   CHOL 246 (H) 12/15/2022   HDL 41.70 12/15/2022   LDLCALC 178 (H) 12/15/2022   TRIG 132.0 12/15/2022   CHOLHDL 6 12/15/2022    Lab Results  Component Value Date   TSH 3.87 12/15/2022    Lab Results  Component Value Date   HGBA1C 9.0 (H) 07/20/2020     Assessment:   1. Well woman exam with routine gynecological exam   2. Screening for cervical cancer   3. Encounter for screening mammogram for malignant neoplasm of breast   4. PMB (postmenopausal bleeding)   5.      Vulvar skin changes  Plan:   1) pretreat with cytotec prior to [facility] 2) pelvic ultrasound  3) Schedule vulvar biopsy 4) pap smear obtained today 5) vulvar biopsy at next visit 6) mammogram scheduled  Allie Bossier, MD [facility]

## 2023-02-04 ENCOUNTER — Ambulatory Visit (INDEPENDENT_AMBULATORY_CARE_PROVIDER_SITE_OTHER): Payer: Medicare Other | Admitting: Acute Care

## 2023-02-04 ENCOUNTER — Encounter: Payer: Self-pay | Admitting: Acute Care

## 2023-02-04 DIAGNOSIS — F17210 Nicotine dependence, cigarettes, uncomplicated: Secondary | ICD-10-CM

## 2023-02-04 NOTE — Patient Instructions (Signed)
Thank you for participating in the [facility]. It was our pleasure to meet you today. We will call you with the results of your scan within the next few days. Your scan will be assigned a Lung RADS category score by the physicians reading the scans.  This Lung RADS score determines follow up scanning.  See below for description of categories, and follow up screening recommendations. We will be in touch to schedule your follow up screening annually or based on recommendations of our providers. We will fax a copy of your scan results to your Primary Care Physician, or the physician who referred you to the program, to ensure they have the results. Please call the office if you have any questions or concerns regarding your scanning experience or results.  Our office number is [phone]. Please speak with Denise Phelps, RN. , or  Denise Buckner RN, They are  our Lung Cancer Screening RN.'s If They are unavailable when you call, Please leave a message on the voice mail. We will return your call at our earliest convenience.This voice mail is monitored several times a day.  Remember, if your scan is normal, we will scan you annually as long as you continue to meet the criteria for the program. (Age 50-80, Current smoker or smoker who has quit within the last 15 years). If you are a smoker, remember, quitting is the single most powerful action that you can take to decrease your risk of lung cancer and other pulmonary, breathing related problems. We know quitting is hard, and we are here to help.  Please let us know if there is anything we can do to help you meet your goal of quitting. If you are a former smoker, congratulations. We are proud of you! Remain smoke free! Remember you can refer friends or family members through the number above.  We will screen them to make sure they meet criteria for the program. Thank you for helping us take better care of you by  participating in Lung Screening.  You can receive free nicotine replacement therapy ( patches, gum or mints) by calling 1-800-QUIT NOW. Please call so we can get you on the path to becoming  a non-smoker. I know it is hard, but you can do this!  Lung RADS Categories:  Lung RADS 1: no nodules or definitely non-concerning nodules.  Recommendation is for a repeat annual scan in 12 months.  Lung RADS 2:  nodules that are non-concerning in appearance and behavior with a very low likelihood of becoming an active cancer. Recommendation is for a repeat annual scan in 12 months.  Lung RADS 3: nodules that are probably non-concerning , includes nodules with a low likelihood of becoming an active cancer.  Recommendation is for a 6-month repeat screening scan. Often noted after an upper respiratory illness. We will be in touch to make sure you have no questions, and to schedule your 6-month scan.  Lung RADS 4 A: nodules with concerning findings, recommendation is most often for a follow up scan in 3 months or additional testing based on our provider's assessment of the scan. We will be in touch to make sure you have no questions and to schedule the recommended 3 month follow up scan.  Lung RADS 4 B:  indicates findings that are concerning. We will be in touch with you to schedule additional diagnostic testing based on our provider's  assessment of the scan.  Other options for assistance in smoking cessation (   As covered by your insurance benefits)  Hypnosis for smoking cessation  [employer] [phone]  Acupuncture for smoking cessation  [facility] [phone]   

## 2023-02-04 NOTE — Progress Notes (Signed)
Virtual Visit via Telephone Note  I connected with Jasmine Larsen on 02/04/23 at  9:30 AM EDT by telephone and verified that I am speaking with the correct person using two identifiers.  Location: Patient: At home Provider: [address] [employer] [place], [place], [address]    I discussed the limitations, risks, security and privacy concerns of performing an evaluation and management service by telephone and the availability of in person appointments. I also discussed with the patient that there may be a patient responsible charge related to this service. The patient expressed understanding and agreed to proceed.    Shared Decision Making Visit Lung Cancer Screening Program [phone])   Eligibility: Age 57 y.o. Pack Years Smoking History Calculation 80 pack years (# packs/per year x # years smoked) Recent History of coughing up blood  no Unexplained weight loss? no ( >Than 15 pounds within the last 6 months ) Prior History Lung / other cancer no (Diagnosis within the last 5 years already requiring surveillance chest CT Scans). Smoking Status Current Smoker Former Smokers: Years since quit:  NA  Quit Date: NA  Visit Components: Discussion included one or more decision making aids. yes Discussion included risk/benefits of screening. yes Discussion included potential follow up diagnostic testing for abnormal scans. yes Discussion included meaning and risk of over diagnosis. yes Discussion included meaning and risk of False Positives. yes Discussion included meaning of total radiation exposure. yes  Counseling Included: Importance of adherence to annual lung cancer LDCT screening. yes Impact of comorbidities on ability to participate in the program. yes Ability and willingness to under diagnostic treatment. yes  Smoking Cessation Counseling: Current Smokers:  Discussed importance of smoking cessation. yes Information about tobacco cessation classes and interventions provided  to patient. yes Patient provided with "ticket" for LDCT Scan. yes Symptomatic Patient. no  Counseling NA Diagnosis Code: Tobacco Use Z72.0 Asymptomatic Patient yes  Counseling (Intermediate counseling: > three minutes counseling) U0454 Former Smokers:  Discussed the importance of maintaining cigarette abstinence. yes Diagnosis Code: Personal History of Nicotine Dependence. U98.119 Information about tobacco cessation classes and interventions provided to patient. Yes Patient provided with "ticket" for LDCT Scan. yes Written Order for Lung Cancer Screening with LDCT placed in Epic. Yes (CT Chest Lung Cancer Screening Low Dose W/O CM) [id number] Z12.2-Screening of respiratory organs Z87.891-Personal history of nicotine dependence  I have spent 25 minutes of face to face/ virtual visit   time with  Jasmine Larsen discussing the risks and benefits of lung cancer screening. We viewed / discussed a power point together that explained in detail the above noted topics. We paused at intervals to allow for questions to be asked and answered to ensure understanding.We discussed that the single most powerful action that she can take to decrease her risk of developing lung cancer is to quit smoking. We discussed whether or not she is ready to commit to setting a quit date. We discussed options for tools to aid in quitting smoking including nicotine replacement therapy, non-nicotine medications, support groups, Quit Smart classes, and behavior modification. We discussed that often times setting smaller, more achievable goals, such as eliminating 1 cigarette a day for a week and then 2 cigarettes a day for a week can be helpful in slowly decreasing the number of cigarettes smoked. This allows for a sense of accomplishment as well as providing a clinical benefit. I provided  her  with smoking cessation  information  with contact information for community resources, classes, free nicotine replacement  therapy, and access  to mobile apps, text messaging, and on-line smoking cessation help. I have also provided  her  the office contact information in the event she needs to contact me, or the screening staff. We discussed the time and location of the scan, and that either Jasmine Miyamoto RN, Jasmine Lemon, RN  or I will call / send a letter with the results within 24-72 hours of receiving them. The patient verbalized understanding of all of  the above and had no further questions upon leaving the office. They have my contact information in the event they have any further questions.  I spent 3-4 minutes counseling on smoking cessation and the health risks of continued tobacco abuse.  I explained to the patient that there has been a high incidence of coronary artery disease noted on these exams. I explained that this is a non-gated exam therefore degree or severity cannot be determined. This patient is on statin therapy. I have asked the patient to follow-up with their PCP regarding any incidental finding of coronary artery disease and management with diet or medication as their PCP  feels is clinically indicated. The patient verbalized understanding of the above and had no further questions upon completion of the visit.      Jasmine Ngo, NP 02/04/2023

## 2023-02-06 ENCOUNTER — Ambulatory Visit
Admission: RE | Admit: 2023-02-06 | Discharge: 2023-02-06 | Disposition: A | Discharge status: Home / Self Care | Payer: Medicare Other | Source: Ambulatory Visit | Attending: Acute Care | Admitting: Acute Care

## 2023-02-06 DIAGNOSIS — Z122 Encounter for screening for malignant neoplasm of respiratory organs: Secondary | ICD-10-CM | POA: Insufficient documentation

## 2023-02-06 DIAGNOSIS — F17210 Nicotine dependence, cigarettes, uncomplicated: Secondary | ICD-10-CM | POA: Insufficient documentation

## 2023-02-06 DIAGNOSIS — Z87891 Personal history of nicotine dependence: Secondary | ICD-10-CM | POA: Insufficient documentation

## 2023-02-06 LAB — CYTOLOGY - PAP
Comment: NEGATIVE
Diagnosis: NEGATIVE
High risk HPV: NEGATIVE

## 2023-02-09 ENCOUNTER — Encounter: Payer: Self-pay | Admitting: Obstetrics & Gynecology

## 2023-02-09 ENCOUNTER — Encounter: Payer: Self-pay | Admitting: Nurse Practitioner

## 2023-02-09 ENCOUNTER — Other Ambulatory Visit: Payer: Self-pay | Admitting: Obstetrics & Gynecology

## 2023-02-09 ENCOUNTER — Telehealth: Payer: Self-pay | Admitting: Obstetrics and Gynecology

## 2023-02-09 DIAGNOSIS — A599 Trichomoniasis, unspecified: Secondary | ICD-10-CM

## 2023-02-09 MED ORDER — METRONIDAZOLE 500 MG PO TABS
500.0000 mg | ORAL_TABLET | Freq: Two times a day (BID) | ORAL | 0 refills | Status: DC
Start: 2023-02-09 — End: 2023-05-02

## 2023-02-09 NOTE — Progress Notes (Signed)
Flagyl prescribed for trich seen on pap 

## 2023-02-09 NOTE — Telephone Encounter (Signed)
This pt was scheduled with you for [facility] and vulvar biopsy on May 21.  She had an appt conflict and the daughter wanted to reschedule the appt.  I don't see a good opening for this procedure for several weeks.  Harriett Sine wanted me to ask you if you would look and see if there is a spot in your schedule where we can use two spots for this procedure like two back to back OB/Gyn spots.

## 2023-02-09 NOTE — Progress Notes (Signed)
Flagyl prescribed for trich

## 2023-02-10 NOTE — Telephone Encounter (Signed)
She can have May 28th at 0955. Or if Logan Bores has something sooner she can be scheduled with him. Looks like she had been seeing Dr. Marice Potter, so either of [place] can see her to do her biopsy.

## 2023-02-11 ENCOUNTER — Other Ambulatory Visit: Payer: Self-pay | Admitting: Acute Care

## 2023-02-11 DIAGNOSIS — Z122 Encounter for screening for malignant neoplasm of respiratory organs: Secondary | ICD-10-CM

## 2023-02-11 DIAGNOSIS — F17210 Nicotine dependence, cigarettes, uncomplicated: Secondary | ICD-10-CM

## 2023-02-11 DIAGNOSIS — Z87891 Personal history of nicotine dependence: Secondary | ICD-10-CM

## 2023-02-11 NOTE — Telephone Encounter (Signed)
The patient's daughter Cicero Duck, on the patient's DPR is confirming new date on 5/28 with Dr. Valentino Saxon

## 2023-02-11 NOTE — Telephone Encounter (Signed)
Reached out to pt to reschedule [facility]: Jasmine Larsen and vulvar biopsy with Dr. Valentino Saxon.  Rescheduled to May 28 at 9:55.  Left message for pt to call back.  [name] called back and confirmed the appt.

## 2023-02-17 ENCOUNTER — Other Ambulatory Visit: Payer: Medicare Other

## 2023-02-17 ENCOUNTER — Ambulatory Visit
Admission: RE | Admit: 2023-02-17 | Discharge: 2023-02-17 | Disposition: A | Discharge status: Home / Self Care | Payer: Medicare Other | Source: Ambulatory Visit | Attending: Obstetrics & Gynecology | Admitting: Obstetrics & Gynecology

## 2023-02-17 DIAGNOSIS — N950 Postmenopausal bleeding: Secondary | ICD-10-CM | POA: Insufficient documentation

## 2023-02-21 ENCOUNTER — Encounter: Payer: Self-pay | Admitting: Obstetrics and Gynecology

## 2023-02-27 ENCOUNTER — Other Ambulatory Visit: Payer: Self-pay

## 2023-02-27 MED ORDER — DICYCLOMINE HCL 20 MG PO TABS: 20.0000 mg | ORAL_TABLET | Freq: Three times a day (TID) | ORAL | 0 refills | Status: DC

## 2023-02-27 NOTE — Telephone Encounter (Signed)
Came in the office with daughter today and ask if she could get a refill has it has been helping her.

## 2023-03-03 ENCOUNTER — Ambulatory Visit: Payer: Medicare Other | Admitting: Obstetrics and Gynecology

## 2023-03-06 NOTE — Progress Notes (Deleted)
    GYNECOLOGY PROGRESS NOTE  Subjective:    Patient ID: Jasmine Larsen, female    DOB: [birth year], 57 y.o.   MRN: [id number]  HPI  Patient is a 57 y.o. [phone] female who presents for endometrial and vulvar biopsy. She had an ultrasound done on  02/17/2023 for postmenopausal bleeding.   {Common ambulatory SmartLinks:19316}  Review of Systems {ros; complete:30496}   Objective:   There were no vitals taken for this visit. There is no height or weight on file to calculate BMI. General appearance: {general exam:16600} Abdomen: {abdominal exam:16834} Pelvic: {pelvic exam:16852::"cervix normal in appearance","external genitalia normal","no adnexal masses or tenderness","no cervical motion tenderness","rectovaginal septum normal","uterus normal size, shape, and consistency","vagina normal without discharge"} Extremities: {extremity exam:5109} Neurologic: {neuro exam:17854}   Ultrasound:  CLINICAL DATA:  Postmenopausal bleeding, history of Caesarean section   EXAM: TRANSABDOMINAL AND TRANSVAGINAL ULTRASOUND OF PELVIS   TECHNIQUE: Both transabdominal and transvaginal ultrasound examinations of the pelvis were performed. Transabdominal technique was performed for global imaging of the pelvis including uterus, ovaries, adnexal regions, and pelvic cul-de-sac. It was necessary to proceed with endovaginal exam following the transabdominal exam to visualize the uterus, endometrium, and ovaries.   COMPARISON:  On 09/2007   FINDINGS: Uterus   Measurements: 9.0 x 5.0 x 6.1 cm = volume: 143 mL. Anteverted. Heterogeneous myometrium. No focal mass. Nabothian cysts at cervix.   Endometrium   Thickness: Inadequately defined due to adjacent heterogeneous myometrium. No definite fluid or mass seen to assist in localization.   Right ovary   Not visualized, likely obscured by bowel   Left ovary   Not visualized, likely obscured by bowel   Other findings   No free pelvic fluid or  adnexal masses.   IMPRESSION: Nonvisualization of ovaries.   Heterogeneous uterus without mass.   Unable to adequately visualize the endometrial complex; in the setting of postmenopausal bleeding and an inability to adequately visualize the endometrial complex, endometrial sampling is indicated to exclude malignancy.  Assessment:   1. PMB (postmenopausal bleeding)      Plan:   There are no diagnoses linked to this encounter.     Endometrial Biopsy Procedure Note  The patient is positioned on the exam table in the dorsal lithotomy position. Bimanual exam confirms uterine position and size. A Graves speculum is placed into the vagina. A single toothed tenaculum is placed onto the anterior lip of the cervix. The pipette is placed into the endocervical canal and is advanced to the uterine fundus. Using a piston like technique, with vacuum created by withdrawing the stylus, the endometrial specimen is obtained and transferred to the biopsy container. Minimal bleeding is encountered. The procedure is well tolerated.   Uterine Position:mid anterior posterior   Uterine Length: 6 7 8 9 10  cm   Uterine Specimen: Scant Average Lush  Post procedure instructions are given. The patient is scheduled for follow up appointment.      Hildred Laser, MD [facility]

## 2023-03-10 ENCOUNTER — Ambulatory Visit: Payer: Medicare Other | Admitting: Obstetrics and Gynecology

## 2023-03-10 DIAGNOSIS — N950 Postmenopausal bleeding: Secondary | ICD-10-CM

## 2023-03-16 ENCOUNTER — Other Ambulatory Visit: Payer: Self-pay

## 2023-03-16 ENCOUNTER — Encounter: Payer: Self-pay | Admitting: Nurse Practitioner

## 2023-03-16 DIAGNOSIS — E1165 Type 2 diabetes mellitus with hyperglycemia: Secondary | ICD-10-CM

## 2023-03-16 MED ORDER — LANCETS MISC. MISC
1.0000 | Freq: Three times a day (TID) | 3 refills | Status: DC
Start: 2023-03-16 — End: 2023-09-04

## 2023-03-16 MED ORDER — BLOOD GLUCOSE TEST VI STRP
1.0000 | ORAL_STRIP | Freq: Three times a day (TID) | 3 refills | Status: DC
Start: 2023-03-16 — End: 2023-07-21

## 2023-03-18 DIAGNOSIS — M51369 Other intervertebral disc degeneration, lumbar region without mention of lumbar back pain or lower extremity pain: Secondary | ICD-10-CM | POA: Insufficient documentation

## 2023-03-18 DIAGNOSIS — M5416 Radiculopathy, lumbar region: Secondary | ICD-10-CM | POA: Insufficient documentation

## 2023-03-31 ENCOUNTER — Encounter: Payer: Self-pay | Admitting: Nurse Practitioner

## 2023-03-31 ENCOUNTER — Ambulatory Visit (INDEPENDENT_AMBULATORY_CARE_PROVIDER_SITE_OTHER): Payer: Medicare Other | Admitting: Nurse Practitioner

## 2023-03-31 ENCOUNTER — Telehealth: Payer: Self-pay

## 2023-03-31 ENCOUNTER — Other Ambulatory Visit (HOSPITAL_COMMUNITY): Payer: Self-pay

## 2023-03-31 VITALS — BP 140/70 | HR 79 | Temp 97.9°F | Resp 16 | Ht 62.0 in | Wt 343.4 lb

## 2023-03-31 DIAGNOSIS — R2231 Localized swelling, mass and lump, right upper limb: Secondary | ICD-10-CM | POA: Insufficient documentation

## 2023-03-31 DIAGNOSIS — E1165 Type 2 diabetes mellitus with hyperglycemia: Secondary | ICD-10-CM

## 2023-03-31 DIAGNOSIS — Z7984 Long term (current) use of oral hypoglycemic drugs: Secondary | ICD-10-CM

## 2023-03-31 DIAGNOSIS — Z7985 Long-term (current) use of injectable non-insulin antidiabetic drugs: Secondary | ICD-10-CM | POA: Diagnosis not present

## 2023-03-31 LAB — LIPID PANEL
Cholesterol: 132 mg/dL (ref 0–200)
HDL: 36.4 mg/dL — ABNORMAL LOW (ref >39.00)
LDL Cholesterol: 76 mg/dL (ref 0–99)
NonHDL: 95.82
Total CHOL/HDL Ratio: 4
Triglycerides: 97 mg/dL (ref 0.0–149.0)
VLDL: 19.4 mg/dL (ref 0.0–40.0)

## 2023-03-31 LAB — POCT GLYCOSYLATED HEMOGLOBIN (HGB A1C): Hemoglobin A1C: 8.3 % — AB (ref 4.0–5.6)

## 2023-03-31 LAB — COMPREHENSIVE METABOLIC PANEL
ALT: 10 U/L (ref 0–35)
AST: 8 U/L (ref 0–37)
Albumin: 4.1 g/dL (ref 3.5–5.2)
Alkaline Phosphatase: 108 U/L (ref 39–117)
BUN: 11 mg/dL (ref 6–23)
CO2: 29 mEq/L (ref 19–32)
Calcium: 9.1 mg/dL (ref 8.4–10.5)
Chloride: 99 mEq/L (ref 96–112)
Creatinine, Ser: 0.67 mg/dL (ref 0.40–1.20)
GFR: 98.55 mL/min (ref >60.00)
Glucose, Bld: 147 mg/dL — ABNORMAL HIGH (ref 70–99)
Potassium: 4.6 mEq/L (ref 3.5–5.1)
Sodium: 136 mEq/L (ref 135–145)
Total Bilirubin: 0.4 mg/dL (ref 0.2–1.2)
Total Protein: 7.8 g/dL (ref 6.0–8.3)

## 2023-03-31 MED ORDER — OZEMPIC (0.25 OR 0.5 MG/DOSE) 2 MG/3ML ~~LOC~~ SOPN
0.2500 mg | PEN_INJECTOR | SUBCUTANEOUS | 0 refills | Status: DC
Start: 2023-03-31 — End: 2023-05-08

## 2023-03-31 NOTE — Assessment & Plan Note (Signed)
Presumed soft tissue mass to the right anterior posterior forearm distally.  Patient had ultrasound that could not identify any abnormality recommended MRI.  MRI order placed today.

## 2023-03-31 NOTE — Assessment & Plan Note (Signed)
Patient currently maintained on metformin 1000 mg twice daily.  A1c reduction from 10.9 to 8.3%.  Will add on Ozempic to aid in weight loss as patient does have pain in the right lower foot and lumbar back weight loss will be beneficial in helping decrease pressure on these joints.

## 2023-03-31 NOTE — Telephone Encounter (Addendum)
Pharmacy Patient Advocate Encounter   Received notification from [facility] that prior authorization for Ozempic (0.25 or 0.5 MG/DOSE) 2MG /3ML pen-injectors is required/requested.   PA submitted to [facility] MEDICAID via [employer] or [facility]) confirmation # [id number] W Status is pending

## 2023-03-31 NOTE — Progress Notes (Signed)
Established Patient Office Visit  Subjective   Patient ID: Jasmine Larsen, female    DOB: 1966/03/06  Age: 57 y.o. MRN: [id number]  Chief Complaint  Patient presents with   Diabetes      DM2: patient seen by me 12/15/2022 and was dx with diabetes. On metformin 1000 mg BID  States that she is checking her sugar States that in the morning 115-130 and the highest has been 245. States prior 289-500 States that she is checking her sugar 3 times a day as of late she is doing it 2 tiems a day   Sates that she does drink a 2 liter of moutnatin dew a day. States that they are working on.   States that she is PT for her back through the [facility]. Sadie Haber is the ortho provider. She is also in pain management . Currently on gabapentin and tizanidine.      Review of Systems  Constitutional:  Negative for chills and fever.  Respiratory:  Negative for shortness of breath.   Cardiovascular:  Negative for chest pain.  Neurological:  Negative for headaches.  Psychiatric/Behavioral:  Negative for hallucinations and suicidal ideas.       Objective:     BP (!) 140/70   Pulse 79   Temp 97.9 F (36.6 C)   Resp 16   Ht 5\' 2"  (1.575 m)   Wt (!) 343 lb 6 oz (155.8 kg)   SpO2 97%   BMI 62.80 kg/m    Physical Exam Vitals and nursing note reviewed.  Constitutional:      Appearance: Normal appearance.  Cardiovascular:     Rate and Rhythm: Normal rate and regular rhythm.     Pulses:          Dorsalis pedis pulses are 2+ on the right side and 2+ on the left side.     Heart sounds: Normal heart sounds.  Pulmonary:     Effort: Pulmonary effort is normal.     Breath sounds: Normal breath sounds.  Musculoskeletal:     Right lower leg: Edema present.     Left lower leg: Edema present.       Feet:  Feet:     Right foot:     Skin integrity: Skin integrity normal.     Toenail Condition: Right toenails are abnormally thick and long.     Left foot:     Skin integrity: Skin  integrity normal.     Toenail Condition: Left toenails are abnormally thick and long.  Neurological:     Mental Status: She is alert.      Results for orders placed or performed in visit on 03/31/23  POCT glycosylated hemoglobin (Hb A1C)  Result Value Ref Range   Hemoglobin A1C 8.3 (A) 4.0 - 5.6 %   HbA1c POC (<> result, manual entry)     HbA1c, POC (prediabetic range)     HbA1c, POC (controlled diabetic range)        The 10-year ASCVD risk score (Arnett DK, et al., 2019) is: 16.9%    Assessment & Plan:   Problem List Items Addressed This Visit       Endocrine   Uncontrolled type 2 diabetes mellitus with hyperglycemia (HCC) - Primary    Patient currently maintained on metformin 1000 mg twice daily.  A1c reduction from 10.9 to 8.3%.  Will add on Ozempic to aid in weight loss as patient does have pain in the right lower foot and lumbar  back weight loss will be beneficial in helping decrease pressure on these joints.      Relevant Medications   Semaglutide,0.25 or 0.5MG /DOS, (OZEMPIC, 0.25 OR 0.5 MG/DOSE,) 2 MG/3ML SOPN   Other Relevant Orders   POCT glycosylated hemoglobin (Hb A1C) (Completed)   Comprehensive metabolic panel   Lipid panel     Other   Mass of right forearm    Presumed soft tissue mass to the right anterior posterior forearm distally.  Patient had ultrasound that could not identify any abnormality recommended MRI.  MRI order placed today.      Relevant Orders   MR FOREARM RIGHT W WO CONTRAST    Return in about 3 months (around 07/01/2023) for DM recheck.    Audria Nine, NP

## 2023-03-31 NOTE — Patient Instructions (Addendum)
Nice to see you today I will be in touch with the labs I have ordered the ozempic which is a once a week injection Follow up with me in 3 months, sooner if you need me  I have also placed the order for a MRI

## 2023-04-03 ENCOUNTER — Other Ambulatory Visit (HOSPITAL_COMMUNITY): Payer: Self-pay

## 2023-04-03 NOTE — Telephone Encounter (Signed)
Called and informed her daughter of this information.

## 2023-04-03 NOTE — Telephone Encounter (Signed)
Patient Advocate Encounter  Prior Authorization for Ozempic has been approved with [place] [employer].    PA# [id number] Effective dates: 04/03/23 through 04/02/24  Per [facility] test claim, copay for 28 days supply is $4

## 2023-04-10 ENCOUNTER — Other Ambulatory Visit: Payer: Self-pay | Admitting: Nurse Practitioner

## 2023-04-10 DIAGNOSIS — E1169 Type 2 diabetes mellitus with other specified complication: Secondary | ICD-10-CM

## 2023-04-10 MED ORDER — ROSUVASTATIN CALCIUM 5 MG PO TABS
5.0000 mg | ORAL_TABLET | Freq: Every day | ORAL | 1 refills | Status: DC
Start: 2023-04-10 — End: 2023-08-13

## 2023-04-21 ENCOUNTER — Encounter: Payer: Self-pay | Admitting: *Deleted

## 2023-04-27 ENCOUNTER — Inpatient Hospital Stay
Admission: EM | Admit: 2023-04-27 | Discharge: 2023-05-02 | DRG: 871 | Disposition: A | Discharge status: Home / Self Care | Payer: Medicare Other | Attending: Internal Medicine | Admitting: Internal Medicine

## 2023-04-27 ENCOUNTER — Emergency Department: Payer: Medicare Other

## 2023-04-27 ENCOUNTER — Other Ambulatory Visit: Payer: Self-pay

## 2023-04-27 DIAGNOSIS — J9601 Acute respiratory failure with hypoxia: Secondary | ICD-10-CM | POA: Diagnosis present

## 2023-04-27 DIAGNOSIS — E871 Hypo-osmolality and hyponatremia: Secondary | ICD-10-CM | POA: Diagnosis present

## 2023-04-27 DIAGNOSIS — Z7951 Long term (current) use of inhaled steroids: Secondary | ICD-10-CM

## 2023-04-27 DIAGNOSIS — Z6841 Body Mass Index (BMI) 40.0 and over, adult: Secondary | ICD-10-CM

## 2023-04-27 DIAGNOSIS — R6520 Severe sepsis without septic shock: Secondary | ICD-10-CM | POA: Diagnosis present

## 2023-04-27 DIAGNOSIS — Z1152 Encounter for screening for COVID-19: Secondary | ICD-10-CM | POA: Diagnosis not present

## 2023-04-27 DIAGNOSIS — Z8249 Family history of ischemic heart disease and other diseases of the circulatory system: Secondary | ICD-10-CM

## 2023-04-27 DIAGNOSIS — I5032 Chronic diastolic (congestive) heart failure: Secondary | ICD-10-CM | POA: Diagnosis present

## 2023-04-27 DIAGNOSIS — J189 Pneumonia, unspecified organism: Secondary | ICD-10-CM | POA: Diagnosis present

## 2023-04-27 DIAGNOSIS — E1165 Type 2 diabetes mellitus with hyperglycemia: Secondary | ICD-10-CM | POA: Diagnosis present

## 2023-04-27 DIAGNOSIS — A419 Sepsis, unspecified organism: Principal | ICD-10-CM

## 2023-04-27 DIAGNOSIS — Z720 Tobacco use: Secondary | ICD-10-CM | POA: Diagnosis present

## 2023-04-27 DIAGNOSIS — F17210 Nicotine dependence, cigarettes, uncomplicated: Secondary | ICD-10-CM | POA: Diagnosis present

## 2023-04-27 DIAGNOSIS — Z801 Family history of malignant neoplasm of trachea, bronchus and lung: Secondary | ICD-10-CM

## 2023-04-27 DIAGNOSIS — J441 Chronic obstructive pulmonary disease with (acute) exacerbation: Secondary | ICD-10-CM | POA: Diagnosis present

## 2023-04-27 DIAGNOSIS — Z7984 Long term (current) use of oral hypoglycemic drugs: Secondary | ICD-10-CM

## 2023-04-27 DIAGNOSIS — Z716 Tobacco abuse counseling: Secondary | ICD-10-CM

## 2023-04-27 DIAGNOSIS — Z833 Family history of diabetes mellitus: Secondary | ICD-10-CM | POA: Diagnosis not present

## 2023-04-27 DIAGNOSIS — J440 Chronic obstructive pulmonary disease with acute lower respiratory infection: Secondary | ICD-10-CM | POA: Diagnosis present

## 2023-04-27 DIAGNOSIS — Z825 Family history of asthma and other chronic lower respiratory diseases: Secondary | ICD-10-CM | POA: Diagnosis not present

## 2023-04-27 DIAGNOSIS — R0602 Shortness of breath: Secondary | ICD-10-CM | POA: Diagnosis not present

## 2023-04-27 LAB — RESP PANEL BY RT-PCR (RSV, FLU A&B, COVID)  RVPGX2
Influenza A by PCR: NEGATIVE
Influenza B by PCR: NEGATIVE
Resp Syncytial Virus by PCR: NEGATIVE
SARS Coronavirus 2 by RT PCR: NEGATIVE

## 2023-04-27 LAB — BASIC METABOLIC PANEL
Anion gap: 13 (ref 5–15)
BUN: 12 mg/dL (ref 6–20)
CO2: 20 mmol/L — ABNORMAL LOW (ref 22–32)
Calcium: 8.3 mg/dL — ABNORMAL LOW (ref 8.9–10.3)
Chloride: 96 mmol/L — ABNORMAL LOW (ref 98–111)
Creatinine, Ser: 0.9 mg/dL (ref 0.44–1.00)
GFR, Estimated: >60 mL/min (ref >60)
Glucose, Bld: 368 mg/dL — ABNORMAL HIGH (ref 70–99)
Potassium: 3.8 mmol/L (ref 3.5–5.1)
Sodium: 129 mmol/L — ABNORMAL LOW (ref 135–145)

## 2023-04-27 LAB — BLOOD GAS, VENOUS
Acid-Base Excess: 1.5 mmol/L (ref 0.0–2.0)
Bicarbonate: 25.9 mmol/L (ref 20.0–28.0)
O2 Saturation: 77.1 %
Patient temperature: 37
pCO2, Ven: 39 mmHg — ABNORMAL LOW (ref 44–60)
pH, Ven: 7.43 (ref 7.25–7.43)
pO2, Ven: 42 mmHg (ref 32–45)

## 2023-04-27 LAB — CBC
HCT: 43.8 % (ref 36.0–46.0)
Hemoglobin: 14 g/dL (ref 12.0–15.0)
MCH: 27.8 pg (ref 26.0–34.0)
MCHC: 32 g/dL (ref 30.0–36.0)
MCV: 87.1 fL (ref 80.0–100.0)
Platelets: 289 10*3/uL (ref 150–400)
RBC: 5.03 MIL/uL (ref 3.87–5.11)
RDW: 13.6 % (ref 11.5–15.5)
WBC: 14.6 10*3/uL — ABNORMAL HIGH (ref 4.0–10.5)
nRBC: 0 % (ref 0.0–0.2)

## 2023-04-27 LAB — TROPONIN I (HIGH SENSITIVITY)
Troponin I (High Sensitivity): 4 ng/L (ref <18)
Troponin I (High Sensitivity): 5 ng/L (ref <18)

## 2023-04-27 LAB — LACTIC ACID, PLASMA
Lactic Acid, Venous: 2.1 mmol/L (ref 0.5–1.9)
Lactic Acid, Venous: 1.5 mmol/L (ref 0.5–1.9)

## 2023-04-27 MED ORDER — ONDANSETRON HCL 4 MG PO TABS: 4.0000 mg | ORAL_TABLET | Freq: Four times a day (QID) | ORAL | Status: DC | PRN

## 2023-04-27 MED ORDER — ROSUVASTATIN CALCIUM 10 MG PO TABS
5.0000 mg | ORAL_TABLET | Freq: Every day | ORAL | Status: DC
  Administered 2023-04-28 – 2023-04-29 (×2): 5 mg via ORAL
  Filled 2023-04-27 (×3): qty 1

## 2023-04-27 MED ORDER — NICOTINE 14 MG/24HR TD PT24: 14.0000 mg | MEDICATED_PATCH | TRANSDERMAL | 0 refills | Status: DC

## 2023-04-27 MED ORDER — SODIUM CHLORIDE 0.9 % IV SOLN
500.0000 mg | INTRAVENOUS | Status: DC
  Administered 2023-04-28: 500 mg via INTRAVENOUS
  Filled 2023-04-27: qty 5

## 2023-04-27 MED ORDER — IPRATROPIUM-ALBUTEROL 0.5-2.5 (3) MG/3ML IN SOLN
9.0000 mL | Freq: Once | RESPIRATORY_TRACT | Status: AC
  Administered 2023-04-27: 9 mL via RESPIRATORY_TRACT
  Filled 2023-04-27: qty 3

## 2023-04-27 MED ORDER — SODIUM CHLORIDE 0.9 % IV SOLN
2.0000 g | INTRAVENOUS | Status: AC
  Administered 2023-04-28 – 2023-05-01 (×4): 2 g via INTRAVENOUS
  Filled 2023-04-27 (×4): qty 20

## 2023-04-27 MED ORDER — LACTATED RINGERS IV SOLN: INTRAVENOUS | Status: DC

## 2023-04-27 MED ORDER — ACETAMINOPHEN 650 MG RE SUPP: 650.0000 mg | Freq: Four times a day (QID) | RECTAL | Status: DC | PRN

## 2023-04-27 MED ORDER — ACETAMINOPHEN 325 MG PO TABS: 650.0000 mg | ORAL_TABLET | Freq: Four times a day (QID) | ORAL | Status: DC | PRN

## 2023-04-27 MED ORDER — NICOTINE 21 MG/24HR TD PT24: 21.0000 mg | MEDICATED_PATCH | Freq: Every day | TRANSDERMAL | Status: DC | PRN

## 2023-04-27 MED ORDER — INSULIN ASPART 100 UNIT/ML IJ SOLN
0.0000 [IU] | Freq: Three times a day (TID) | INTRAMUSCULAR | Status: DC
  Administered 2023-04-28 (×2): 20 [IU] via SUBCUTANEOUS
  Administered 2023-04-28 (×2): 15 [IU] via SUBCUTANEOUS
  Administered 2023-04-29: 20 [IU] via SUBCUTANEOUS
  Administered 2023-04-29: 11 [IU] via SUBCUTANEOUS
  Administered 2023-04-29: 7 [IU] via SUBCUTANEOUS
  Administered 2023-04-30 (×2): 20 [IU] via SUBCUTANEOUS
  Administered 2023-04-30 – 2023-05-01 (×2): 4 [IU] via SUBCUTANEOUS
  Administered 2023-05-01: 11 [IU] via SUBCUTANEOUS
  Administered 2023-05-01: 15 [IU] via SUBCUTANEOUS
  Administered 2023-05-02: 3 [IU] via SUBCUTANEOUS
  Filled 2023-04-27 (×11): qty 1

## 2023-04-27 MED ORDER — SODIUM CHLORIDE 0.9 % IV SOLN
500.0000 mg | Freq: Once | INTRAVENOUS | Status: AC
  Administered 2023-04-27: 500 mg via INTRAVENOUS
  Filled 2023-04-27: qty 5

## 2023-04-27 MED ORDER — FLUTICASONE FUROATE-VILANTEROL 200-25 MCG/ACT IN AEPB
1.0000 | INHALATION_SPRAY | Freq: Every day | RESPIRATORY_TRACT | Status: DC
  Administered 2023-04-30 – 2023-05-02 (×3): 1 via RESPIRATORY_TRACT
  Filled 2023-04-27 (×3): qty 28

## 2023-04-27 MED ORDER — GABAPENTIN 300 MG PO CAPS
300.0000 mg | ORAL_CAPSULE | Freq: Three times a day (TID) | ORAL | Status: DC
  Administered 2023-04-27 – 2023-05-02 (×14): 300 mg via ORAL
  Filled 2023-04-27 (×14): qty 1

## 2023-04-27 MED ORDER — METHYLPREDNISOLONE SODIUM SUCC 125 MG IJ SOLR
125.0000 mg | Freq: Once | INTRAMUSCULAR | Status: AC
  Administered 2023-04-27: 125 mg via INTRAVENOUS
  Filled 2023-04-27: qty 2

## 2023-04-27 MED ORDER — ACETAMINOPHEN 325 MG PO TABS
650.0000 mg | ORAL_TABLET | Freq: Once | ORAL | Status: AC
  Administered 2023-04-27: 650 mg via ORAL
  Filled 2023-04-27: qty 2

## 2023-04-27 MED ORDER — TIZANIDINE HCL 4 MG PO TABS: 4.0000 mg | ORAL_TABLET | Freq: Four times a day (QID) | ORAL | Status: DC | PRN

## 2023-04-27 MED ORDER — IPRATROPIUM-ALBUTEROL 0.5-2.5 (3) MG/3ML IN SOLN
3.0000 mL | Freq: Four times a day (QID) | RESPIRATORY_TRACT | Status: DC
  Administered 2023-04-28 – 2023-04-29 (×5): 3 mL via RESPIRATORY_TRACT
  Filled 2023-04-27 (×6): qty 3

## 2023-04-27 MED ORDER — SODIUM CHLORIDE 0.9 % IV BOLUS
500.0000 mL | Freq: Once | INTRAVENOUS | Status: AC
  Administered 2023-04-27: 500 mL via INTRAVENOUS

## 2023-04-27 MED ORDER — SODIUM CHLORIDE 0.9% FLUSH
3.0000 mL | Freq: Two times a day (BID) | INTRAVENOUS | Status: DC
  Administered 2023-04-28 – 2023-05-02 (×9): 3 mL via INTRAVENOUS

## 2023-04-27 MED ORDER — INSULIN GLARGINE-YFGN 100 UNIT/ML ~~LOC~~ SOLN
5.0000 [IU] | Freq: Every day | SUBCUTANEOUS | Status: DC
  Administered 2023-04-28 (×2): 5 [IU] via SUBCUTANEOUS
  Filled 2023-04-27 (×3): qty 0.05

## 2023-04-27 MED ORDER — SODIUM CHLORIDE 0.9 % IV SOLN
1.0000 g | Freq: Once | INTRAVENOUS | Status: AC
  Administered 2023-04-27: 1 g via INTRAVENOUS
  Filled 2023-04-27: qty 10

## 2023-04-27 MED ORDER — PREDNISONE 20 MG PO TABS: 40.0000 mg | ORAL_TABLET | Freq: Every day | ORAL | Status: DC

## 2023-04-27 MED ORDER — ENOXAPARIN SODIUM 80 MG/0.8ML IJ SOSY
75.0000 mg | PREFILLED_SYRINGE | INTRAMUSCULAR | Status: DC
  Administered 2023-04-28 – 2023-05-02 (×5): 75 mg via SUBCUTANEOUS
  Filled 2023-04-27: qty 0.8
  Filled 2023-04-27 (×2): qty 0.75
  Filled 2023-04-27: qty 0.8
  Filled 2023-04-27: qty 0.75

## 2023-04-27 MED ORDER — NICOTINE POLACRILEX 4 MG MT LOZG: 4.0000 mg | LOZENGE | OROMUCOSAL | 0 refills | Status: DC | PRN

## 2023-04-27 MED ORDER — ONDANSETRON HCL 4 MG/2ML IJ SOLN
4.0000 mg | Freq: Four times a day (QID) | INTRAMUSCULAR | Status: DC | PRN
  Administered 2023-04-27: 4 mg via INTRAVENOUS
  Filled 2023-04-27: qty 2

## 2023-04-27 NOTE — Assessment & Plan Note (Signed)
CBG is markedly elevated today.  Last A1c approximately 3 weeks ago was 8.3%.  - Hold home antiglycemic agents - SSI, resistant - Semglee 5 units at bedtime

## 2023-04-27 NOTE — Assessment & Plan Note (Signed)
Patient is presenting with several day history of cough with shortness of breath, found to be hypoxic as low as 87%, requiring 6 L to improve.  Likely due to COPD exacerbation in addition to community-acquired pneumonia.  - Continue supplemental oxygen to maintain oxygen saturation above 88% - Wean as tolerated

## 2023-04-27 NOTE — Assessment & Plan Note (Addendum)
Patient presenting with significant tachycardia and tachypnea, hypoxic respiratory failure, leukocytosis concerning for sepsis.  Lactic minimally elevated and normalized with fluids. Chest x-ray demonstrating perihilar infiltration with peribronchial thickening.  Given hyponatremia, will need to rule out Legionella.  - Given lack of hypotension, no indication for 30 cc/kg bolus - Maintenance fluids as ordered - Continue Ceftriaxone and azithromycin - RVP - Procalcitonin - Legionella and strep urinary antigens - Blood cultures

## 2023-04-27 NOTE — Assessment & Plan Note (Signed)
Patient currently smokes approximately 3 packs/day and is not open to cessation at this time.

## 2023-04-27 NOTE — Assessment & Plan Note (Signed)
Severe COPD exacerbation in the setting of pneumonia.  - S/p Solu-Medrol 125 mg once - Start prednisone 40 mg tomorrow to complete a 5-day course - DuoNebs every 6 hours - Continue home bronchodilators

## 2023-04-27 NOTE — ED Triage Notes (Signed)
Pt sts that she has been having problems breathing for the last several days and has been coughing up dark green sputum. Pt sts that she has not ate anything for the last 5 days due to her not being able to breath well.

## 2023-04-27 NOTE — ED Provider Notes (Addendum)
[facility] Provider Note    Event Date/Time   First MD Initiated Contact with Patient 04/27/23 2029     (approximate)   History   Shortness of Breath   HPI  Jasmine Larsen is a 57 y.o. female   Past medical history of COPD, CHF, asthma, diabetes, elevated BMI, smoker with 3 packs/day.  Who presents to the emergency department today for several days of cough with productive thick green sputum, shortness of breath, subjective fever chills.  She denies chest pain.  No GI or GU symptoms.    Independent Historian contributed to assessment above: Her daughter is at bedside to corroborate information as above  External Medical Documents Reviewed: Tober 2021 discharge summary when she admitted for COVID-19 pna        Physical Exam   Triage Vital Signs: ED Triage Vitals  Encounter Vitals Group     BP 04/27/23 1957 125/76     Systolic BP Percentile --      Diastolic BP Percentile --      Pulse Rate 04/27/23 1957 (!) 123     Resp 04/27/23 1957 (!) 24     Temp 04/27/23 1957 99.9 F (37.7 C)     Temp Source 04/27/23 1957 Oral     SpO2 04/27/23 1957 (!) 87 %     Weight 04/27/23 1959 (!) 346 lb (156.9 kg)     Height 04/27/23 1959 5\' 4"  (1.626 m)     Head Circumference --      Peak Flow --      Pain Score 04/27/23 1958 9     Pain Loc --      Pain Education --      Exclude from Growth Chart --     Most recent vital signs: Vitals:   04/27/23 2003 04/27/23 2005  BP:    Pulse:    Resp:    Temp:    SpO2: (!) 89% 93%    General: Awake, no distress.  CV:  Good peripheral perfusion.  Resp:  Normal effort.  Abd:  No distention.  Other:  Tachypnea, diffuse wheezing throughout, speaking in full sentences.  Hypoxemic to the mid 80s requiring 6 L of cannula to normalize to the mid to upper 90%.  Tachycardic in the 110s.  Normotensive, afebrile.  Soft nontender abdomen.   ED Results / Procedures / Treatments   Labs (all labs ordered are  listed, but only abnormal results are displayed) Labs Reviewed  BASIC METABOLIC PANEL - Abnormal; Notable for the following components:      Result Value   Sodium 129 (*)    Chloride 96 (*)    CO2 20 (*)    Glucose, Bld 368 (*)    Calcium 8.3 (*)    All other components within normal limits  CBC - Abnormal; Notable for the following components:   WBC 14.6 (*)    All other components within normal limits  LACTIC ACID, PLASMA - Abnormal; Notable for the following components:   Lactic Acid, Venous 2.1 (*)    All other components within normal limits  BLOOD GAS, VENOUS - Abnormal; Notable for the following components:   pCO2, Ven 39 (*)    All other components within normal limits  RESP PANEL BY RT-PCR (RSV, FLU A&B, COVID)  [id number]  CULTURE, BLOOD (SINGLE)  CULTURE, BLOOD (ROUTINE X 2)  CULTURE, BLOOD (ROUTINE X 2)  LACTIC ACID, PLASMA  TROPONIN I (HIGH SENSITIVITY)  I ordered and reviewed the above labs they are notable for white blood cell count is elevated at 14, pH and pCO2 within normal limits.  EKG  ED ECG REPORT I, Pilar Jarvis, the attending physician, personally viewed and interpreted this ECG.   Date: 04/27/2023  EKG Time: 2002  Rate: 121  Rhythm: sinus tachycardia  Axis: nl  Intervals:none  ST&T Change: no stemi    RADIOLOGY I independently reviewed and interpreted chest x-ray and I see some right-sided midlung opacities concerning for pneumonia   PROCEDURES:  Critical Care performed: Yes, see critical care procedure note(s)  .Critical Care  Performed by: Pilar Jarvis, MD Authorized by: Pilar Jarvis, MD   Critical care provider statement:    Critical care time (minutes):  30   Critical care was time spent personally by me on the following activities:  Development of treatment plan with patient or surrogate, discussions with consultants, evaluation of patient's response to treatment, examination of patient, ordering and review of laboratory studies,  ordering and review of radiographic studies, ordering and performing treatments and interventions, pulse oximetry, re-evaluation of patient's condition and review of old charts    MEDICATIONS ORDERED IN ED: Medications  sodium chloride 0.9 % bolus 500 mL (has no administration in time range)  azithromycin (ZITHROMAX) 500 mg in sodium chloride 0.9 % 250 mL IVPB (has no administration in time range)  cefTRIAXone (ROCEPHIN) 1 g in sodium chloride 0.9 % 100 mL IVPB (has no administration in time range)  ipratropium-albuterol (DUONEB) 0.5-2.5 (3) MG/3ML nebulizer solution 9 mL (has no administration in time range)  methylPREDNISolone sodium succinate (SOLU-MEDROL) 125 mg/2 mL injection 125 mg (has no administration in time range)    External physician / consultants:  I spoke with hospitalist for admission and regarding care plan for this patient.   IMPRESSION / MDM / ASSESSMENT AND PLAN / ED COURSE  I reviewed the triage vital signs and the nursing notes.                                Patient's presentation is most consistent with acute presentation with potential threat to life or bodily function.  Differential diagnosis includes, but is not limited to, COPD exacerbation, asthma exacerbation, respiratory infection, bacterial pneumonia   The patient is on the cardiac monitor to evaluate for evidence of arrhythmia and/or significant heart rate changes.  MDM:    Treating COPD exacerbation with wheezing productive sputum oxygen requirement with DuoNebs, Solu-Medrol.  White blood cell count is elevated and I see an opacity in the right side concerning for bacterial pneumonia so started on community-acquired pneumonia coverage.  Concern for sepsis given her heart rate, respiratory rate white blood cell count elevation.  I am hesitant to start her on the full sepsis fluid bolus given her history of CHF for fear if there is an element of fluid overload contributing that excessive fluids may  worsen her respiratory status will start with 500 cc normal saline bolus first and give additional as needed carefully.  Acute hypoxemic respiratory failure with oxygenation in the mid 80s on room air but normalized on 6 L, no respiratory distress, I do not think she needs BiPAP now.  Admission.    -- I spent 5 minutes counseling this patient on smoking cessation.  We spoke about the patient's current tobacco use, impact of smoking, assessed willingness to quit, methods for cessation including medical management and nicotine replacement therapy (  which I prescribed to the patient) and advised follow-up with primary doctor to continue to address smoking cessation. --     FINAL CLINICAL IMPRESSION(S) / ED DIAGNOSES   Final diagnoses:  COPD exacerbation (HCC)  Acute hypoxemic respiratory failure (HCC)  Encounter for smoking cessation counseling     Rx / DC Orders   ED Discharge Orders          Ordered    nicotine polacrilex (NICOTINE MINI) 4 MG lozenge  As needed        04/27/23 2117    nicotine (NICODERM CQ - DOSED IN MG/24 HOURS) 14 mg/24hr patch  Every 24 hours        04/27/23 2117             Note:  This document was prepared using Dragon voice recognition software and may include unintentional dictation errors.    Pilar Jarvis, MD 04/27/23 2109    Pilar Jarvis, MD 04/27/23 2117    Pilar Jarvis, MD 04/27/23 2118

## 2023-04-27 NOTE — H&P (Signed)
History and Physical    Patient: Jasmine Larsen:[id number] DOB: 1966/02/15 DOA: 04/27/2023 DOS: the patient was seen and examined on 04/27/2023 PCP: Eden Emms, NP  Patient coming from: Home  Chief Complaint:  Chief Complaint  Patient presents with   Shortness of Breath   HPI: Jasmine Larsen is a 57 y.o. female with medical history significant of COPD with asthma, HFpEF, type 2 diabetes, gastric ulcers, morbid obesity with BMI 59, who presents to the ED due to shortness of breath.  Ms. Jasmine Larsen states that her symptoms started on 7/9 with gradually worsening productive cough with green sputum that was thick, in addition to shortness of breath.  She has also been experiencing nausea with poor p.o. intake and vomiting.  Over the last couple days, she has had some GI upset with nonbloody, nonmelanotic diarrhea.  She endorses body aches and generalized malaise.  Today, she noted that her left eye seemed to have thick discharge.  She also endorses sore throat.  She denies any chest pain, palpitations.  She denies any lower extremity swelling.  ED course: On arrival to the ED, patient was normotensive at 125/76 with tachycardia with rate of 123.  She was saturating at 87% with improvement to 96% after 6 L of supplemental oxygen.  She was afebrile at 99.9.  She was tachypneic at 24/minute. Initial workup notable for WBC of 14.6, sodium of 129, bicarb 20, glucose 368, creatinine 0.90 with GFR above 60, lactic acid 2.1 and negative troponins.  VBG with pH of 7.43 with pCO2 of 39.  COVID-19, influenza and RSV PCR negative.  Chest x-ray with perihilar infiltration and peribronchial thickening.  Patient started on azithromycin, ceftriaxone, DuoNebs, Solu-Medrol.  TR[facility] contacted for admission.  Review of Systems: As mentioned in the history of present illness. All other systems reviewed and are negative.  Past Medical History:  Diagnosis Date   Asthma    CHF (congestive heart failure) (HCC)     COPD (chronic obstructive pulmonary disease) (HCC)    Diabetes mellitus without complication (HCC)    Miscarriage    Multiple gastric ulcers    Past Surgical History:  Procedure Laterality Date   CESAREAN SECTION     x3   Social History:  reports that she has been smoking cigarettes. She has a 80 pack-year smoking history. She has never used smokeless tobacco. She reports that she does not drink alcohol and does not use drugs.  Allergies  Allergen Reactions   Cymbalta [Duloxetine Hcl]     SI thoughts    Family History  Problem Relation Age of Onset   Asthma Mother    Diabetes Mother    COPD Mother    Heart Problems Mother    Diabetes Brother    COPD Brother    Congestive Heart Failure Brother    Alcohol abuse Maternal Grandmother    Tuberculosis Maternal Grandmother    Lung cancer Maternal Grandmother     Prior to Admission medications   Medication Sig Start Date End Date Taking? Authorizing Provider  albuterol (VENTOLIN HFA) 108 (90 Base) MCG/ACT inhaler Inhale 2 puffs into the lungs every 6 (six) hours as needed for wheezing or shortness of breath. 01/26/23  Yes Eden Emms, NP  blood glucose meter kit and supplies KIT Dispense based on patient and insurance preference. Use up to four times daily as directed. (FOR ICD-9 250.00, 250.01). 07/22/20  Yes Tresa Moore, MD  Blood Glucose Monitoring Suppl DEVI 1 each by  Does not apply route as directed. May substitute to any manufacturer covered by patient's insurance. 12/15/22  Yes Eden Emms, NP  dicyclomine (BENTYL) 20 MG tablet Take 1 tablet (20 mg total) by mouth 4 (four) times daily -  before meals and at bedtime for 14 days. 02/27/23 04/27/23 Yes Eden Emms, NP  fluticasone-salmeterol (ADVAIR DISKUS) 250-50 MCG/ACT AEPB Inhale 1 puff into the lungs in the morning and at bedtime. 12/16/22  Yes Eden Emms, NP  gabapentin (NEURONTIN) 300 MG capsule Take 300 mg by mouth 3 (three) times daily. 03/18/23  Yes [provider]  Glucose Blood (BLOOD GLUCOSE TEST STRIPS) STRP 1 each by In Vitro route in the morning, at noon, and at bedtime. May substitute to any manufacturer covered by patient's insurance. 03/16/23  Yes Eden Emms, NP  Lancets Misc. MISC 1 each by Does not apply route in the morning, at noon, and at bedtime. May substitute to any manufacturer covered by patient's insurance. 03/16/23  Yes Eden Emms, NP  metFORMIN (GLUCOPHAGE) 500 MG tablet Take 2 tablets (1,000 mg total) by mouth 2 (two) times daily with a meal. 01/19/23  Yes Eden Emms, NP  metroNIDAZOLE (FLAGYL) 500 MG tablet Take 1 tablet (500 mg total) by mouth 2 (two) times daily. Patient not taking: Reported on 04/27/2023 02/09/23   Allie Bossier, MD  nicotine (NICODERM CQ - DOSED IN MG/24 HOURS) 14 mg/24hr patch Place 1 patch (14 mg total) onto the skin daily. 04/27/23 04/26/24 Yes Pilar Jarvis, MD  nicotine polacrilex (NICOTINE MINI) 4 MG lozenge Take 1 lozenge (4 mg total) by mouth as needed. 04/27/23  Yes Pilar Jarvis, MD  ondansetron (ZOFRAN) 4 MG tablet Take 1 tablet (4 mg total) by mouth every 6 (six) hours as needed for nausea or vomiting. 01/26/23  Yes Eden Emms, NP  rosuvastatin (CRESTOR) 5 MG tablet Take 1 tablet (5 mg total) by mouth daily. 04/10/23  Yes Eden Emms, NP  tiZANidine (ZANAFLEX) 4 MG tablet Take 4 mg by mouth every 6 (six) hours. 02/27/23  Yes [provider]  misoprostol (CYTOTEC) 200 MCG tablet Take 3 pills by mouth the night before biopsy. Patient not taking: Reported on 04/27/2023 02/03/23   Allie Bossier, MD  Semaglutide,0.25 or 0.5MG /DOS, (OZEMPIC, 0.25 OR 0.5 MG/DOSE,) 2 MG/3ML SOPN Inject 0.25 mg into the skin once a week. Patient not taking: Reported on 04/27/2023 03/31/23   Eden Emms, NP    Physical Exam: Vitals:   04/27/23 2100 04/27/23 2130 04/27/23 2200 04/27/23 2300  BP: 137/71 135/77 (!) 130/57 115/62  Pulse: (!) 110 (!) 107 (!) 111 (!) 112  Resp: (!) 24 (!) 23 (!) 25 (!) 25   Temp:      TempSrc:      SpO2: 96% 93% (!) 89% 90%  Weight:      Height:       Physical Exam Vitals and nursing note reviewed.  Constitutional:      Appearance: She is morbidly obese. She is ill-appearing.  HENT:     Head: Normocephalic and atraumatic.     Mouth/Throat:     Pharynx: Oropharynx is clear. No pharyngeal swelling or oropharyngeal exudate.  Eyes:     Extraocular Movements: Extraocular movements intact.     Pupils: Pupils are equal, round, and reactive to light.  Cardiovascular:     Rate and Rhythm: Regular rhythm. Tachycardia present.     Heart sounds: No murmur heard. Pulmonary:  Effort: Tachypnea present.     Breath sounds: Wheezing (Diffuse) and rhonchi (Diffuse) present.     Comments: Pursed lip breathing Abdominal:     Palpations: Abdomen is soft.     Tenderness: There is abdominal tenderness (Diffusely).  Musculoskeletal:     Right lower leg: No edema.     Left lower leg: No edema.  Skin:    General: Skin is warm and dry.  Neurological:     General: No focal deficit present.     Mental Status: She is alert and oriented to person, place, and time.  Psychiatric:        Mood and Affect: Mood normal.        Behavior: Behavior normal.    Data Reviewed: CBC with WBC 14.6, hemoglobin 14.0, platelets of 289 CMP with sodium of 129, potassium 3.8, chloride 96, bicarb 20, glucose 368, BUN 12, creatinine 0.90, GFR above 60 Troponin negative at 4 and then 5 Lactic acid 2.1 VBG with pH of 7.43, pCO2 of 39 COVID-19, influenza and RSV PCR negative  EKG personally reviewed.  Sinus rhythm with rate of 121.  PACs noted.  No ischemic appearing ST or T wave changes.  DG Chest Port 1 View  Result Date: 04/27/2023 CLINICAL DATA:  Cough and sputum. Shortness of breath over a few days. Decreased oxygen saturation. Current smoker. EXAM: PORTABLE CHEST 1 VIEW COMPARISON:  12/15/2022 FINDINGS: Normal heart size. Perihilar infiltration with peribronchial thickening  suggesting bronchitis or airways disease. No focal consolidation. No pleural effusions. No pneumothorax. Mediastinal contours appear intact. IMPRESSION: Perihilar infiltration and peribronchial thickening suggesting airways disease. Electronically Signed   By: Burman Nieves M.D.   On: 04/27/2023 20:58    Results are pending, will review when available.  Assessment and Plan:  * Sepsis due to pneumonia [facility]) Patient presenting with significant tachycardia and tachypnea, hypoxic respiratory failure, leukocytosis concerning for sepsis.  Lactic minimally elevated and normalized with fluids. Chest x-ray demonstrating perihilar infiltration with peribronchial thickening.  Given hyponatremia, will need to rule out Legionella.  - Given lack of hypotension, no indication for 30 cc/kg bolus - Maintenance fluids as ordered - Continue Ceftriaxone and azithromycin - RVP - Procalcitonin - Legionella and strep urinary antigens - Blood cultures  Acute respiratory failure with hypoxia (HCC) Patient is presenting with several day history of cough with shortness of breath, found to be hypoxic as low as 87%, requiring 6 L to improve.  Likely due to COPD exacerbation in addition to community-acquired pneumonia.  - Continue supplemental oxygen to maintain oxygen saturation above 88% - Wean as tolerated  COPD with acute exacerbation (HCC) Severe COPD exacerbation in the setting of pneumonia.  - S/p Solu-Medrol 125 mg once - Start prednisone 40 mg tomorrow to complete a 5-day course - DuoNebs every 6 hours - Continue home bronchodilators  Chronic diastolic CHF (congestive heart failure) (HCC) Patient has a history of HFpEF, however is not currently on any diuretic therapy.  Given clinical history, unlikely current symptoms are due to heart failure.  Uncontrolled type 2 diabetes mellitus with hyperglycemia (HCC) CBG is markedly elevated today.  Last A1c approximately 3 weeks ago was 8.3%.  - Hold home  antiglycemic agents - SSI, resistant - Semglee 5 units at bedtime  Morbid obesity (HCC) History of severe morbid obesity with BMI 59, complicated by uncontrolled type 2 diabetes.  Tobacco abuse Patient currently smokes approximately 3 packs/day and is not open to cessation at this time.  Advance Care Planning:  Code Status: Full Code verified by patient and her daughter at bedside  Consults: None  Family Communication: Patient's daughter updated at bedside  Severity of Illness: The appropriate patient status for this patient is INPATIENT. Inpatient status is judged to be reasonable and necessary in order to provide the required intensity of service to ensure the patient's safety. The patient's presenting symptoms, physical exam findings, and initial radiographic and laboratory data in the context of their chronic comorbidities is felt to place them at high risk for further clinical deterioration. Furthermore, it is not anticipated that the patient will be medically stable for discharge from the hospital within 2 midnights of admission.   * I certify that at the point of admission it is my clinical judgment that the patient will require inpatient hospital care spanning beyond 2 midnights from the point of admission due to high intensity of service, high risk for further deterioration and high frequency of surveillance required.*  Author: Verdene Lennert, MD 04/27/2023 11:11 PM  For on call review [email or web address].

## 2023-04-27 NOTE — Assessment & Plan Note (Signed)
History of severe morbid obesity with BMI 59, complicated by uncontrolled type 2 diabetes.

## 2023-04-27 NOTE — Assessment & Plan Note (Signed)
Patient has a history of HFpEF, however is not currently on any diuretic therapy.  Given clinical history, unlikely current symptoms are due to heart failure.

## 2023-04-28 DIAGNOSIS — A419 Sepsis, unspecified organism: Secondary | ICD-10-CM | POA: Diagnosis not present

## 2023-04-28 DIAGNOSIS — J189 Pneumonia, unspecified organism: Secondary | ICD-10-CM | POA: Diagnosis not present

## 2023-04-28 LAB — RESPIRATORY PANEL BY PCR

## 2023-04-28 LAB — HIV ANTIBODY (ROUTINE TESTING W REFLEX): HIV Screen 4th Generation wRfx: NONREACTIVE

## 2023-04-28 LAB — PROCALCITONIN: Procalcitonin: 0.14 ng/mL

## 2023-04-28 LAB — BASIC METABOLIC PANEL
Anion gap: 11 (ref 5–15)
BUN: 13 mg/dL (ref 6–20)
CO2: 23 mmol/L (ref 22–32)
Calcium: 8.1 mg/dL — ABNORMAL LOW (ref 8.9–10.3)
Chloride: 100 mmol/L (ref 98–111)
Creatinine, Ser: 0.83 mg/dL (ref 0.44–1.00)
GFR, Estimated: >60 mL/min (ref >60)
Glucose, Bld: 371 mg/dL — ABNORMAL HIGH (ref 70–99)
Potassium: 3.9 mmol/L (ref 3.5–5.1)
Sodium: 134 mmol/L — ABNORMAL LOW (ref 135–145)

## 2023-04-28 LAB — CBG MONITORING, ED
Glucose-Capillary: 346 mg/dL — ABNORMAL HIGH (ref 70–99)
Glucose-Capillary: 338 mg/dL — ABNORMAL HIGH (ref 70–99)
Glucose-Capillary: 405 mg/dL — ABNORMAL HIGH (ref 70–99)
Glucose-Capillary: 403 mg/dL — ABNORMAL HIGH (ref 70–99)

## 2023-04-28 LAB — STREP PNEUMONIAE URINARY ANTIGEN: Strep Pneumo Urinary Antigen: NEGATIVE

## 2023-04-28 LAB — GLUCOSE, CAPILLARY: Glucose-Capillary: 236 mg/dL — ABNORMAL HIGH (ref 70–99)

## 2023-04-28 LAB — CBC
HCT: 39.2 % (ref 36.0–46.0)
Hemoglobin: 12.7 g/dL (ref 12.0–15.0)
MCH: 27.2 pg (ref 26.0–34.0)
MCHC: 32.4 g/dL (ref 30.0–36.0)
MCV: 83.9 fL (ref 80.0–100.0)
Platelets: 270 10*3/uL (ref 150–400)
RBC: 4.67 MIL/uL (ref 3.87–5.11)
RDW: 13.3 % (ref 11.5–15.5)
WBC: 13.1 10*3/uL — ABNORMAL HIGH (ref 4.0–10.5)
nRBC: 0 % (ref 0.0–0.2)

## 2023-04-28 MED ORDER — METHYLPREDNISOLONE SODIUM SUCC 40 MG IJ SOLR
40.0000 mg | Freq: Every day | INTRAMUSCULAR | Status: DC
  Administered 2023-04-28 – 2023-04-29 (×2): 40 mg via INTRAVENOUS
  Filled 2023-04-28 (×2): qty 1

## 2023-04-28 NOTE — Progress Notes (Signed)
PROGRESS NOTE   HPI was taken from Dr. Huel Cote:  Jasmine Larsen is a 57 y.o. female with medical history significant of COPD with asthma, HFpEF, type 2 diabetes, gastric ulcers, morbid obesity with BMI 59, who presents to the ED due to shortness of breath.   Jasmine Larsen states that her symptoms started on 7/9 with gradually worsening productive cough with green sputum that was thick, in addition to shortness of breath.  She has also been experiencing nausea with poor p.o. intake and vomiting.  Over the last couple days, she has had some GI upset with nonbloody, nonmelanotic diarrhea.  She endorses body aches and generalized malaise.  Today, she noted that her left eye seemed to have thick discharge.  She also endorses sore throat.  She denies any chest pain, palpitations.  She denies any lower extremity swelling.   ED course: On arrival to the ED, patient was normotensive at 125/76 with tachycardia with rate of 123.  She was saturating at 87% with improvement to 96% after 6 L of supplemental oxygen.  She was afebrile at 99.9.  She was tachypneic at 24/minute. Initial workup notable for WBC of 14.6, sodium of 129, bicarb 20, glucose 368, creatinine 0.90 with GFR above 60, lactic acid 2.1 and negative troponins.  VBG with pH of 7.43 with pCO2 of 39.  COVID-19, influenza and RSV PCR negative.  Chest x-ray with perihilar infiltration and peribronchial thickening.  Patient started on azithromycin, ceftriaxone, DuoNebs, Solu-Medrol.  TRH contacted for admission.   Jasmine Larsen  WUJ:[id number] DOB: May 14, 1966 DOA: 04/27/2023 PCP: Eden Emms, NP   Assessment & Plan:   Principal Problem:   Sepsis due to pneumonia [facility]) Active Problems:   Acute respiratory failure with hypoxia (HCC)   COPD with acute exacerbation (HCC)   Chronic diastolic CHF (congestive heart failure) (HCC)   Uncontrolled type 2 diabetes mellitus with hyperglycemia (HCC)   Morbid obesity (HCC)   Tobacco abuse  Assessment  and Plan: Sepsis: met criteria w/ tachycardia, tachypnea, leukocytosis & pneumonia. Continue on IV abxs, IV steroids, bronchodilators, supplemental oxygen & encourage incentive spirometry. Strep, legionella ordered. Blood cxs pending. Procal 0.14   Acute hypoxic respiratory failure: found to be hypoxic as low as 87%, requiring 6 L to improve.  Likely due to COPD exacerbation & CAP. Continue on abxs, steroids, bronchodilators & encourage incentive spirometry   CAP: continue on IV ceftriaxone, azithromycin, steroids, & encourage incentive spirometry. Strep, legionella ordered    COPD exacerbation: severe. Continue on IV steroids, bronchodilators & encourage incentive spirometry   Chronic diastolic CHF: appears compensated. Monitor I/Os. Continue on statin. Not on any CHF meds as per med rec    DM2: poorly controlled, HbA1c 8.3%. Continue on glargine, SSI w/ accuchecks    Morbid obesity: BMI 59.3. Complicates overall care & prognosis      DVT prophylaxis: lovenox  Code Status: full  Family Communication:  Disposition Plan: likely d/c back home   Level of care: Progressive Status is: Inpatient Remains inpatient appropriate because: severity of illness    Consultants:    Procedures:   Antimicrobials: azithromycin, rocephin    Subjective: Pt c/o shortness of breath   Objective: Vitals:   04/28/23 0000 04/28/23 0200 04/28/23 0628 04/28/23 0746  BP: (!) 124/57 (!) 127/59 124/62 128/67  Pulse: (!) 101 98 78 95  Resp: (!) 29 (!) 26 20 20   Temp:      TempSrc:      SpO2: (!) 89% (!) 88%  94% 96%  Weight:      Height:        Intake/Output Summary (Last 24 hours) at 04/28/2023 0838 Last data filed at 04/27/2023 2314 Gross per 24 hour  Intake 850 ml  Output --  Net 850 ml   Filed Weights   04/27/23 1959  Weight: (!) 156.9 kg    Examination:  General exam: Appears calm and comfortable. Morbidly obese Respiratory system: diminished breath sounds b/l  Cardiovascular  system: S1/S2+ No rubs, gallops or clicks. Gastrointestinal system: Abdomen is obese, soft and nontender. Hypoactive bowel sounds heard. Central nervous system: Alert and oriented. Moves all extremities  Psychiatry: Judgement and insight appear normal. Mood & affect appropriate.     Data Reviewed: I have personally reviewed following labs and imaging studies  CBC: Recent Labs  Lab 04/27/23 2023 04/28/23 0603  WBC 14.6* 13.1*  HGB 14.0 12.7  HCT 43.8 39.2  MCV 87.1 83.9  PLT 289 270   Basic Metabolic Panel: Recent Labs  Lab 04/27/23 2023 04/28/23 0603  NA 129* 134*  K 3.8 3.9  CL 96* 100  CO2 20* 23  GLUCOSE 368* 371*  BUN 12 13  CREATININE 0.90 0.83  CALCIUM 8.3* 8.1*   GFR: Estimated Creatinine Clearance: 115.6 mL/min (by C-G formula based on SCr of 0.83 mg/dL). Liver Function Tests: No results for input(s): "AST", "ALT", "ALKPHOS", "BILITOT", "PROT", "ALBUMIN" in the last 168 hours. No results for input(s): "LIPASE", "AMYLASE" in the last 168 hours. No results for input(s): "AMMONIA" in the last 168 hours. Coagulation Profile: No results for input(s): "INR", "PROTIME" in the last 168 hours. Cardiac Enzymes: No results for input(s): "CKTOTAL", "CKMB", "CKMBINDEX", "TROPONINI" in the last 168 hours. BNP (last 3 results) Recent Labs    12/15/22 0930  PROBNP 15.0   HbA1C: No results for input(s): "HGBA1C" in the last 72 hours. CBG: Recent Labs  Lab 04/28/23 0103  GLUCAP 346*   Lipid Profile: No results for input(s): "CHOL", "HDL", "LDLCALC", "TRIG", "CHOLHDL", "LDLDIRECT" in the last 72 hours. Thyroid Function Tests: No results for input(s): "TSH", "T4TOTAL", "FREET4", "T3FREE", "THYROIDAB" in the last 72 hours. Anemia Panel: No results for input(s): "VITAMINB12", "FOLATE", "FERRITIN", "TIBC", "IRON", "RETICCTPCT" in the last 72 hours. Sepsis Labs: Recent Labs  Lab 04/27/23 2020 04/27/23 2023 04/27/23 2206  PROCALCITON 0.14  --   --   LATICACIDVEN   --  2.1* 1.5    Recent Results (from the past 240 hour(s))  Resp panel by RT-PCR (RSV, Flu A&B, Covid) Anterior Nasal Swab     Status: None   Collection Time: 04/27/23  8:20 PM   Specimen: Anterior Nasal Swab  Result Value Ref Range Status   SARS Coronavirus 2 by RT PCR NEGATIVE NEGATIVE Final    Comment: (NOTE) SARS-CoV-2 target nucleic acids are NOT DETECTED.  The SARS-CoV-2 RNA is generally detectable in upper respiratory specimens during the acute phase of infection. The lowest concentration of SARS-CoV-2 viral copies this assay can detect is 138 copies/mL. A negative result does not preclude SARS-Cov-2 infection and should not be used as the sole basis for treatment or other patient management decisions. A negative result may occur with  improper specimen collection/handling, submission of specimen other than nasopharyngeal swab, presence of viral mutation(s) within the areas targeted by this assay, and inadequate number of viral copies(<138 copies/mL). A negative result must be combined with clinical observations, patient history, and epidemiological information. The expected result is Negative.  Fact Sheet for Patients:  BloggerCourse.com  Fact Sheet for Healthcare Providers:  SeriousBroker.it  This test is no t yet approved or cleared by the [place] FDA and  has been authorized for detection and/or diagnosis of SARS-CoV-2 by FDA under an Emergency Use Authorization (EUA). This EUA will remain  in effect (meaning this test can be used) for the duration of the COVID-19 declaration under Section 564(b)(1) of the Act, 21 U.S.C.section 360bbb-3(b)(1), unless the authorization is terminated  or revoked sooner.       Influenza A by PCR NEGATIVE NEGATIVE Final   Influenza B by PCR NEGATIVE NEGATIVE Final    Comment: (NOTE) The Xpert Xpress SARS-CoV-2/FLU/RSV plus assay is intended as an aid in the diagnosis of  influenza from Nasopharyngeal swab specimens and should not be used as a sole basis for treatment. Nasal washings and aspirates are unacceptable for Xpert Xpress SARS-CoV-2/FLU/RSV testing.  Fact Sheet for Patients: BloggerCourse.com  Fact Sheet for Healthcare Providers: SeriousBroker.it  This test is not yet approved or cleared by the [place] FDA and has been authorized for detection and/or diagnosis of SARS-CoV-2 by FDA under an Emergency Use Authorization (EUA). This EUA will remain in effect (meaning this test can be used) for the duration of the COVID-19 declaration under Section 564(b)(1) of the Act, 21 U.S.C. section 360bbb-3(b)(1), unless the authorization is terminated or revoked.     Resp Syncytial Virus by PCR NEGATIVE NEGATIVE Final    Comment: (NOTE) Fact Sheet for Patients: BloggerCourse.com  Fact Sheet for Healthcare Providers: SeriousBroker.it  This test is not yet approved or cleared by the [place] FDA and has been authorized for detection and/or diagnosis of SARS-CoV-2 by FDA under an Emergency Use Authorization (EUA). This EUA will remain in effect (meaning this test can be used) for the duration of the COVID-19 declaration under Section 564(b)(1) of the Act, 21 U.S.C. section 360bbb-3(b)(1), unless the authorization is terminated or revoked.  Performed at [facility], [address] [place], [place] [address]   Blood culture (single)     Status: None (Preliminary result)   Collection Time: 04/27/23  8:23 PM   Specimen: BLOOD  Result Value Ref Range Status   Specimen Description BLOOD BLOOD LEFT ARM  Final   Special Requests   Final    BOTTLES DRAWN AEROBIC AND ANAEROBIC Blood Culture adequate volume   Culture   Final    NO GROWTH < 12 HOURS Performed at [facility], [address], [place], [place] [address]     Report Status PENDING  Incomplete  Blood Culture (routine x 2)     Status: None (Preliminary result)   Collection Time: 04/27/23  9:30 PM   Specimen: BLOOD  Result Value Ref Range Status   Specimen Description BLOOD BLOOD RIGHT FOREARM  Final   Special Requests   Final    BOTTLES DRAWN AEROBIC AND ANAEROBIC Blood Culture adequate volume   Culture   Final    NO GROWTH < 12 HOURS Performed at [facility], [address], [place], [place] [address]    Report Status PENDING  Incomplete         Radiology Studies: DG Chest Port 1 View  Result Date: 04/27/2023 CLINICAL DATA:  Cough and sputum. Shortness of breath over a few days. Decreased oxygen saturation. Current smoker. EXAM: PORTABLE CHEST 1 VIEW COMPARISON:  12/15/2022 FINDINGS: Normal heart size. Perihilar infiltration with peribronchial thickening suggesting bronchitis or airways disease. No focal consolidation. No pleural effusions. No pneumothorax. Mediastinal contours appear  intact. IMPRESSION: Perihilar infiltration and peribronchial thickening suggesting airways disease. Electronically Signed   By: Burman Nieves M.D.   On: 04/27/2023 20:58        Scheduled Meds:  enoxaparin (LOVENOX) injection  75 mg Subcutaneous Q24H   fluticasone furoate-vilanterol  1 puff Inhalation Daily   gabapentin  300 mg Oral TID   insulin aspart  0-20 Units Subcutaneous TID WC   insulin glargine-yfgn  5 Units Subcutaneous QHS   ipratropium-albuterol  3 mL Nebulization Q6H   predniSONE  40 mg Oral Q breakfast   rosuvastatin  5 mg Oral Daily   sodium chloride flush  3 mL Intravenous Q12H   Continuous Infusions:  azithromycin     cefTRIAXone (ROCEPHIN)  IV     lactated ringers 125 mL/hr at 04/28/23 0623     LOS: 1 day    Time spent: 35 mins     Charise Killian, MD [facility] Pager [phone]  If 7PM-7AM, please contact night-coverage [email or web address] 04/28/2023, 8:38 AM

## 2023-04-28 NOTE — Progress Notes (Signed)
PHARMACY - PHYSICIAN COMMUNICATION CRITICAL VALUE ALERT - BLOOD CULTURE IDENTIFICATION (BCID)  Jasmine Larsen is an 57 y.o. female who presented to [facility] on 04/27/2023 with a chief complaint of SOB, n/v, diarrhea  Assessment:  1/4 (aerobic) GPR  not run on BCID. Sending to [facility] for cultures  Name of physician (or Provider) Contacted: Dr. Arville Care  Current antibiotics: Ceftriaxone, Azithromycin  Changes to prescribed antibiotics recommended:  Patient is on recommended antibiotics - No changes needed  No results found for this or any previous visit.  Sharen Hones, PharmD, BCPS Clinical Pharmacist   04/28/2023  8:07 PM

## 2023-04-28 NOTE — ED Notes (Signed)
Daughter at bedside visiting. She brought patient [employer].

## 2023-04-28 NOTE — Progress Notes (Signed)
   04/28/23 1600  Spiritual Encounters  Type of Visit Initial  Care provided to: Pt and family  Conversation partners present during encounter Nurse  Referral source Family  Reason for visit Advance directives  OnCall Visit No  Spiritual Framework  Presenting Themes Goals in life/care  Community/Connection Family  Patient Stress Factors Health changes  Family Stress Factors None identified  Interventions  Spiritual Care Interventions Made Encouragement;Compassionate presence;Established relationship of care and support;Reflective listening  Intervention Outcomes  Outcomes Awareness around self/spiritual resourses;Connection to spiritual care  Spiritual Care Plan  Spiritual Care Issues Still Outstanding No further spiritual care needs at this time (see row info)   Chaplain received spiritual consult for an advanced directive. Educated patient and family about advance directive. Patient will fill out paperwork and contact chaplain services tomorrow to have it notarized.

## 2023-04-28 NOTE — Inpatient Diabetes Management (Signed)
Inpatient Diabetes Program Recommendations  AACE/ADA: New Consensus Statement on Inpatient Glycemic Control (2015)  Target Ranges:  Prepandial:   less than 140 mg/dL      Peak postprandial:   less than 180 mg/dL (1-2 hours)      Critically ill patients:  140 - 180 mg/dL   Lab Results  Component Value Date   GLUCAP 346 (H) 04/28/2023   HGBA1C 8.3 (A) 03/31/2023    Review of Glycemic Control  Latest Reference Range & Units 04/28/23 01:03  Glucose-Capillary 70 - 99 mg/dL 540 (H)  (H): Data is abnormally high  Diabetes history: DM2 Outpatient Diabetes medications:  Metformin 1000 mg BID Ozempic weekly Current orders for Inpatient glycemic control:  Semglee 5 units at bedtime  Novolog 0-20 units TID Semglee 5 units QHS  Inpatient Diabetes Program Recommendations:    Please consider:  Semglee 16 units every day (0.1 units x 156.9 kg)  Will continue to follow while inpatient.  Thank you, Dulce Sellar, MSN, CDCES Diabetes Coordinator Inpatient Diabetes Program [phone] (team pager from 8a-5p)

## 2023-04-29 DIAGNOSIS — J189 Pneumonia, unspecified organism: Secondary | ICD-10-CM | POA: Diagnosis not present

## 2023-04-29 DIAGNOSIS — J441 Chronic obstructive pulmonary disease with (acute) exacerbation: Secondary | ICD-10-CM | POA: Diagnosis not present

## 2023-04-29 DIAGNOSIS — A419 Sepsis, unspecified organism: Secondary | ICD-10-CM | POA: Diagnosis not present

## 2023-04-29 DIAGNOSIS — J9601 Acute respiratory failure with hypoxia: Secondary | ICD-10-CM | POA: Diagnosis not present

## 2023-04-29 LAB — CBC
HCT: 38.8 % (ref 36.0–46.0)
Hemoglobin: 12.4 g/dL (ref 12.0–15.0)
MCH: 27.6 pg (ref 26.0–34.0)
MCHC: 32 g/dL (ref 30.0–36.0)
MCV: 86.4 fL (ref 80.0–100.0)
Platelets: 268 10*3/uL (ref 150–400)
RBC: 4.49 MIL/uL (ref 3.87–5.11)
RDW: 13.5 % (ref 11.5–15.5)
WBC: 17.5 10*3/uL — ABNORMAL HIGH (ref 4.0–10.5)
nRBC: 0 % (ref 0.0–0.2)

## 2023-04-29 LAB — GLUCOSE, CAPILLARY
Glucose-Capillary: 222 mg/dL — ABNORMAL HIGH (ref 70–99)
Glucose-Capillary: 252 mg/dL — ABNORMAL HIGH (ref 70–99)
Glucose-Capillary: 368 mg/dL — ABNORMAL HIGH (ref 70–99)
Glucose-Capillary: 159 mg/dL — ABNORMAL HIGH (ref 70–99)

## 2023-04-29 LAB — BASIC METABOLIC PANEL
Anion gap: 8 (ref 5–15)
BUN: 23 mg/dL — ABNORMAL HIGH (ref 6–20)
CO2: 26 mmol/L (ref 22–32)
Calcium: 7.9 mg/dL — ABNORMAL LOW (ref 8.9–10.3)
Chloride: 101 mmol/L (ref 98–111)
Creatinine, Ser: 0.72 mg/dL (ref 0.44–1.00)
GFR, Estimated: >60 mL/min (ref >60)
Glucose, Bld: 245 mg/dL — ABNORMAL HIGH (ref 70–99)
Potassium: 3.9 mmol/L (ref 3.5–5.1)
Sodium: 135 mmol/L (ref 135–145)

## 2023-04-29 LAB — LEGIONELLA PNEUMOPHILA SEROGP 1 UR AG: L. pneumophila Serogp 1 Ur Ag: NEGATIVE

## 2023-04-29 MED ORDER — AZITHROMYCIN 500 MG PO TABS
500.0000 mg | ORAL_TABLET | Freq: Every day | ORAL | Status: AC
  Administered 2023-04-29 – 2023-05-01 (×3): 500 mg via ORAL
  Filled 2023-04-29 (×2): qty 1
  Filled 2023-04-29: qty 2

## 2023-04-29 MED ORDER — INSULIN GLARGINE-YFGN 100 UNIT/ML ~~LOC~~ SOLN
15.0000 [IU] | Freq: Every day | SUBCUTANEOUS | Status: DC
  Administered 2023-04-29 – 2023-05-01 (×3): 15 [IU] via SUBCUTANEOUS
  Filled 2023-04-29 (×3): qty 0.15

## 2023-04-29 MED ORDER — IPRATROPIUM-ALBUTEROL 0.5-2.5 (3) MG/3ML IN SOLN
3.0000 mL | Freq: Three times a day (TID) | RESPIRATORY_TRACT | Status: DC
  Administered 2023-04-29 – 2023-05-02 (×8): 3 mL via RESPIRATORY_TRACT
  Filled 2023-04-29 (×7): qty 3

## 2023-04-29 NOTE — Progress Notes (Signed)
   04/29/23 1600  Spiritual Encounters  Type of Visit Initial  Care provided to: Pt and family  Referral source Patient request  Reason for visit Urgent spiritual support  OnCall Visit Yes  Spiritual Framework  Presenting Themes Rituals and practive  Patient Stress Factors Major life changes  Family Stress Factors Major life changes  Interventions  Spiritual Care Interventions Made Established relationship of care and support;Compassionate presence;Reflective listening;Prayer  Intervention Outcomes  Outcomes Reduced anxiety;Reduced fear  Spiritual Care Plan  Spiritual Care Issues Still Outstanding Chaplain will continue to follow   Spoke with patient and daughter on complete AD. Daughter knows a Conservation officer, nature and calling to see if she can complete the AD. Advise patient that if unable we can try to complete Ad on tomorrow morning.

## 2023-04-29 NOTE — Plan of Care (Signed)
  Problem: Fluid Volume: Goal: Hemodynamic stability will improve Outcome: Progressing   Problem: Respiratory: Goal: Ability to maintain adequate ventilation will improve Outcome: Not Progressing

## 2023-04-29 NOTE — Progress Notes (Addendum)
Progress Note    Jasmine Larsen  ZOX:[id number] DOB: [birth year]  DOA: 04/27/2023 PCP: Eden Emms, NP      Brief Narrative:    Medical records reviewed and are as summarized below:  Jasmine Larsen is a 56 y.o. female with medical history significant for COPD, asthma, chronic diastolic CHF, type II DM, gastric ulcers, morbid obesity, tobacco use disorder since age 89, who presented to the hospital with cough productive of dark greenish sputum, worsening shortness of breath and wheezing.        Assessment/Plan:   Principal Problem:   Sepsis due to pneumonia [facility]) Active Problems:   Acute respiratory failure with hypoxia (HCC)   COPD with acute exacerbation (HCC)   Chronic diastolic CHF (congestive heart failure) (HCC)   Uncontrolled type 2 diabetes mellitus with hyperglycemia (HCC)   Morbid obesity (HCC)   Tobacco abuse    Body mass index is 59.39 kg/m.  (Morbid obesity)   Sepsis secondary to pneumonia: Continue oral azithromycin and IV ceftriaxone. Gram-positive rod bacteremia.  1 out of 4 blood culture bottles showed gram-positive rods.  Follow-up blood cultures.   COPD exacerbation: Continue steroids, bronchodilators and antibiotics.   Acute hypoxic respiratory failure: Oxygen requirement is down from 6 L/min to 4 L/min oxygen via [place].  Wean off oxygen as able.   Chronic diastolic CHF: Compensated   Type II DM with hyperglycemia: Increase Semglee from 5 units to 15 units nightly.  Continue NovoLog as needed for hyperglycemia. Hemoglobin A1c was 8.3 on 03/31/2023. (She is on metformin and semaglutide at home)   Tobacco use disorder: She said she has been smoking since she was 57 years old.  She used to smoke about 5 packs of cigarettes a day.  She cut down smoking to 3 packs a day and she went down to 2 packs a day for about 2 months prior to admission. She has been counseled to quit smoking cigarettes.  She said she is still trying. Continue  nicotine patch Chart review showed that she had low-dose CT chest for lung cancer screening on 02/06/2023 which showed "small solid pulmonary nodule of the right upper lobe measuring 4.2 mm, unchanged when compared with the prior".  Annual screening with low-dose CT chest in 12 months was recommended.     Diet Order             Diet heart healthy/carb modified Room service appropriate? Yes; Fluid consistency: Thin  Diet effective now                            Consultants: None  Procedures: None    Medications:    azithromycin  500 mg Oral QHS   enoxaparin (LOVENOX) injection  75 mg Subcutaneous Q24H   fluticasone furoate-vilanterol  1 puff Inhalation Daily   gabapentin  300 mg Oral TID   insulin aspart  0-20 Units Subcutaneous TID WC   insulin glargine-yfgn  5 Units Subcutaneous QHS   ipratropium-albuterol  3 mL Nebulization Q6H   methylPREDNISolone (SOLU-MEDROL) injection  40 mg Intravenous Daily   rosuvastatin  5 mg Oral Daily   sodium chloride flush  3 mL Intravenous Q12H   Continuous Infusions:  cefTRIAXone (ROCEPHIN)  IV 2 g (04/28/23 2213)     Anti-infectives (From admission, onward)    Start     Dose/Rate Route Frequency Ordered Stop   04/29/23 2200  azithromycin (ZITHROMAX) tablet 500 mg  500 mg Oral Daily at bedtime 04/29/23 0808 05/02/23 2159   04/28/23 2200  cefTRIAXone (ROCEPHIN) 2 g in sodium chloride 0.9 % 100 mL IVPB        2 g 200 mL/hr over 30 Minutes Intravenous Every 24 hours 04/27/23 2300 05/02/23 2159   04/28/23 2200  azithromycin (ZITHROMAX) 500 mg in sodium chloride 0.9 % 250 mL IVPB  Status:  Discontinued        500 mg 250 mL/hr over 60 Minutes Intravenous Every 24 hours 04/27/23 2300 04/29/23 0808   04/27/23 2100  azithromycin (ZITHROMAX) 500 mg in sodium chloride 0.9 % 250 mL IVPB        500 mg 250 mL/hr over 60 Minutes Intravenous  Once 04/27/23 2057 04/27/23 2314   04/27/23 2100  cefTRIAXone (ROCEPHIN) 1 g in sodium  chloride 0.9 % 100 mL IVPB        1 g 200 mL/hr over 30 Minutes Intravenous  Once 04/27/23 2057 04/27/23 2156              Family Communication/Anticipated D/C date and plan/Code Status   DVT prophylaxis:      Code Status: Full Code  Family Communication: None Disposition Plan: Plan to discharge home in 2 to 3 days   Status is: Inpatient Remains inpatient appropriate because: Acute hypoxic respiratory failure, COPD exacerbation, on IV antibiotics for sepsis/pneumonia       Subjective:   Interval events noted.  She complains of dry cough, shortness of breath and wheezing.  Shortness of breath is worse with minimal exertion.  She wants to have a shower.  She said she is steady on her feet.  Objective:    Vitals:   04/28/23 2030 04/28/23 2311 04/29/23 0351 04/29/23 0756  BP:  135/70 (!) 140/87 118/75  Pulse:  87 77 78  Resp:  20 (!) 22 16  Temp:  98.5 F (36.9 C) 97.9 F (36.6 C) 98.4 F (36.9 C)  TempSrc:      SpO2: 96% 98% 94% 99%  Weight:      Height:       No data found.   Intake/Output Summary (Last 24 hours) at 04/29/2023 1001 Last data filed at 04/29/2023 0101 Gross per 24 hour  Intake 350 ml  Output 200 ml  Net 150 ml   Filed Weights   04/27/23 1959  Weight: (!) 156.9 kg    Exam:  GEN: NAD SKIN: Warm and dry EYES: No pallor or icterus ENT: MMM CV: RRR PULM: Decreased air entry bilaterally, bilateral expiratory wheezing.  No rales heard ABD: soft, obese, NT, +BS CNS: AAO x 3, non focal EXT: Mild b/l leg edema, no tenderness       Data Reviewed:   I have personally reviewed following labs and imaging studies:  Labs: Labs show the following:   Basic Metabolic Panel: Recent Labs  Lab 04/27/23 2023 04/28/23 0603 04/29/23 0334  NA 129* 134* 135  K 3.8 3.9 3.9  CL 96* 100 101  CO2 20* 23 26  GLUCOSE 368* 371* 245*  BUN 12 13 23*  CREATININE 0.90 0.83 0.72  CALCIUM 8.3* 8.1* 7.9*   GFR Estimated Creatinine  Clearance: 119.9 mL/min (by C-G formula based on SCr of 0.72 mg/dL). Liver Function Tests: No results for input(s): "AST", "ALT", "ALKPHOS", "BILITOT", "PROT", "ALBUMIN" in the last 168 hours. No results for input(s): "LIPASE", "AMYLASE" in the last 168 hours. No results for input(s): "AMMONIA" in the last 168 hours. Coagulation profile No  results for input(s): "INR", "PROTIME" in the last 168 hours.  CBC: Recent Labs  Lab 04/27/23 2023 04/28/23 0603 04/29/23 0334  WBC 14.6* 13.1* 17.5*  HGB 14.0 12.7 12.4  HCT 43.8 39.2 38.8  MCV 87.1 83.9 86.4  PLT 289 270 268   Cardiac Enzymes: No results for input(s): "CKTOTAL", "CKMB", "CKMBINDEX", "TROPONINI" in the last 168 hours. BNP (last 3 results) Recent Labs    12/15/22 0930  PROBNP 15.0   CBG: Recent Labs  Lab 04/28/23 0918 04/28/23 1200 04/28/23 1717 04/28/23 2138 04/29/23 0755  GLUCAP 338* 405* 403* 236* 222*   D-Dimer: No results for input(s): "DDIMER" in the last 72 hours. Hgb A1c: No results for input(s): "HGBA1C" in the last 72 hours. Lipid Profile: No results for input(s): "CHOL", "HDL", "LDLCALC", "TRIG", "CHOLHDL", "LDLDIRECT" in the last 72 hours. Thyroid function studies: No results for input(s): "TSH", "T4TOTAL", "T3FREE", "THYROIDAB" in the last 72 hours.  Invalid input(s): "FREET3" Anemia work up: No results for input(s): "VITAMINB12", "FOLATE", "FERRITIN", "TIBC", "IRON", "RETICCTPCT" in the last 72 hours. Sepsis Labs: Recent Labs  Lab 04/27/23 2020 04/27/23 2023 04/27/23 2206 04/28/23 0603 04/29/23 0334  PROCALCITON 0.14  --   --   --   --   WBC  --  14.6*  --  13.1* 17.5*  LATICACIDVEN  --  2.1* 1.5  --   --     Microbiology Recent Results (from the past 240 hour(s))  Resp panel by RT-PCR (RSV, Flu A&B, Covid) Anterior Nasal Swab     Status: None   Collection Time: 04/27/23  8:20 PM   Specimen: Anterior Nasal Swab  Result Value Ref Range Status   SARS Coronavirus 2 by RT PCR NEGATIVE  NEGATIVE Final    Comment: (NOTE) SARS-CoV-2 target nucleic acids are NOT DETECTED.  The SARS-CoV-2 RNA is generally detectable in upper respiratory specimens during the acute phase of infection. The lowest concentration of SARS-CoV-2 viral copies this assay can detect is 138 copies/mL. A negative result does not preclude SARS-Cov-2 infection and should not be used as the sole basis for treatment or other patient management decisions. A negative result may occur with  improper specimen collection/handling, submission of specimen other than nasopharyngeal swab, presence of viral mutation(s) within the areas targeted by this assay, and inadequate number of viral copies(<138 copies/mL). A negative result must be combined with clinical observations, patient history, and epidemiological information. The expected result is Negative.  Fact Sheet for Patients:  BloggerCourse.com  Fact Sheet for Healthcare Providers:  SeriousBroker.it  This test is no t yet approved or cleared by the [place] FDA and  has been authorized for detection and/or diagnosis of SARS-CoV-2 by FDA under an Emergency Use Authorization (EUA). This EUA will remain  in effect (meaning this test can be used) for the duration of the COVID-19 declaration under Section 564(b)(1) of the Act, 21 U.S.C.section 360bbb-3(b)(1), unless the authorization is terminated  or revoked sooner.       Influenza A by PCR NEGATIVE NEGATIVE Final   Influenza B by PCR NEGATIVE NEGATIVE Final    Comment: (NOTE) The Xpert Xpress SARS-CoV-2/FLU/RSV plus assay is intended as an aid in the diagnosis of influenza from Nasopharyngeal swab specimens and should not be used as a sole basis for treatment. Nasal washings and aspirates are unacceptable for Xpert Xpress SARS-CoV-2/FLU/RSV testing.  Fact Sheet for Patients: BloggerCourse.com  Fact Sheet for Healthcare  Providers: SeriousBroker.it  This test is not yet approved or cleared by the  [place] [employer] trader and has been authorized for detection and/or diagnosis of SARS-CoV-2 by FDA under an TEFL teacher (EUA). This EUA will remain in effect (meaning this test can be used) for the duration of the COVID-19 declaration under Section 564(b)(1) of the Act, 21 U.S.C. section 360bbb-3(b)(1), unless the authorization is terminated or revoked.     Resp Syncytial Virus by PCR NEGATIVE NEGATIVE Final    Comment: (NOTE) Fact Sheet for Patients: BloggerCourse.com  Fact Sheet for Healthcare Providers: SeriousBroker.it  This test is not yet approved or cleared by the [place] FDA and has been authorized for detection and/or diagnosis of SARS-CoV-2 by FDA under an Emergency Use Authorization (EUA). This EUA will remain in effect (meaning this test can be used) for the duration of the COVID-19 declaration under Section 564(b)(1) of the Act, 21 U.S.C. section 360bbb-3(b)(1), unless the authorization is terminated or revoked.  Performed at [facility], [address] [place], [place] [address]   Blood culture (single)     Status: None (Preliminary result)   Collection Time: 04/27/23  8:23 PM   Specimen: BLOOD  Result Value Ref Range Status   Specimen Description BLOOD BLOOD LEFT ARM  Final   Special Requests   Final    BOTTLES DRAWN AEROBIC AND ANAEROBIC Blood Culture adequate volume   Culture   Final    NO GROWTH 2 DAYS Performed at [facility], [address] [place], [place] [address]    Report Status PENDING  Incomplete  Blood Culture (routine x 2)     Status: None (Preliminary result)   Collection Time: 04/27/23  9:30 PM   Specimen: BLOOD RIGHT FOREARM  Result Value Ref Range Status   Specimen Description   Final    BLOOD RIGHT FOREARM Performed at [facility],  [address] [place], [place] [address]    Special Requests   Final    BOTTLES DRAWN AEROBIC AND ANAEROBIC Blood Culture adequate volume Performed at [facility], [address] [place], [place] [address]    Culture  Setup Time   Final    GRAM POSITIVE RODS AEROBIC BOTTLE ONLY CRITICAL RESULT CALLED TO, READ BACK BY AND VERIFIED WITH: CARISSA [facility] AT 1953 ON 04/28/23 BY SS Performed at [facility], [address], [place], [place] [address]    Culture   Final    Romie Minus POSITIVE RODS TOO YOUNG TO READ Performed at [facility], [address] [place], [place] [address]    Report Status PENDING  Incomplete  Respiratory (~20 pathogens) panel by PCR     Status: None   Collection Time: 04/28/23  3:45 AM   Specimen: Nasopharyngeal Swab; Respiratory  Result Value Ref Range Status   Adenovirus NOT DETECTED NOT DETECTED Final   Coronavirus 229E NOT DETECTED NOT DETECTED Final    Comment: (NOTE) The Coronavirus on the Respiratory Panel, DOES NOT test for the novel  Coronavirus (2019 nCoV)    Coronavirus HKU1 NOT DETECTED NOT DETECTED Final   Coronavirus NL63 NOT DETECTED NOT DETECTED Final   Coronavirus OC43 NOT DETECTED NOT DETECTED Final   Metapneumovirus NOT DETECTED NOT DETECTED Final   Rhinovirus / Enterovirus NOT DETECTED NOT DETECTED Final   Influenza A NOT DETECTED NOT DETECTED Final   Influenza B NOT DETECTED NOT DETECTED Final   Parainfluenza Virus 1 NOT DETECTED NOT DETECTED Final   Parainfluenza Virus 2 NOT DETECTED NOT DETECTED Final   Parainfluenza Virus 3  NOT DETECTED NOT DETECTED Final   Parainfluenza Virus 4 NOT DETECTED NOT DETECTED Final   Respiratory Syncytial Virus NOT DETECTED NOT DETECTED Final   Bordetella pertussis NOT DETECTED NOT DETECTED Final   Bordetella Parapertussis NOT DETECTED NOT DETECTED Final   Chlamydophila pneumoniae NOT DETECTED NOT DETECTED Final   Mycoplasma pneumoniae NOT DETECTED NOT DETECTED Final    Comment:  Performed at [facility], [address], [place], [place] [address]    Procedures and diagnostic studies:  DG Chest Port 1 View  Result Date: 04/27/2023 CLINICAL DATA:  Cough and sputum. Shortness of breath over a few days. Decreased oxygen saturation. Current smoker. EXAM: PORTABLE CHEST 1 VIEW COMPARISON:  12/15/2022 FINDINGS: Normal heart size. Perihilar infiltration with peribronchial thickening suggesting bronchitis or airways disease. No focal consolidation. No pleural effusions. No pneumothorax. Mediastinal contours appear intact. IMPRESSION: Perihilar infiltration and peribronchial thickening suggesting airways disease. Electronically Signed   By: Burman Nieves M.D.   On: 04/27/2023 20:58               LOS: 2 days   Hezekiah Veltre  [facility]   Pager on [email or web address]. If 7PM-7AM, please contact night-coverage at [email or web address]     04/29/2023, 10:01 AM

## 2023-04-29 NOTE — Progress Notes (Signed)
PHARMACIST - PHYSICIAN COMMUNICATION  CONCERNING: Antibiotic IV to Oral Route Change Policy  RECOMMENDATION: This patient is receiving azithromycin by the intravenous route.  Based on criteria approved by the Pharmacy and Therapeutics Committee, the antibiotic(s) is/are being converted to the equivalent oral dose form(s).  DESCRIPTION: These criteria include: Patient being treated for a respiratory tract infection, urinary tract infection, cellulitis or clostridium difficile associated diarrhea if on metronidazole The patient is not neutropenic and does not exhibit a GI malabsorption state The patient is eating (either orally or via tube) and/or has been taking other orally administered medications for a least 24 hours The patient is improving clinically and has a Tmax < 100.5  If you have questions about this conversion, please contact the Pharmacy Department   Tressie Ellis 04/29/23

## 2023-04-29 NOTE — Progress Notes (Signed)
Transition of Care [facility]) - Inpatient Brief Assessment   Patient Details  Name: Jasmine Larsen MRN: [id number] Date of Birth: 1966-10-06  Transition of Care [facility]) CM/SW Contact:    Truddie Hidden, RN Phone Number: 04/29/2023, 10:19 AM   Clinical Narrative: Case reviewed. Housing identified on SDOH. Spoke with patient about needs and resources. She denied any issues with housing and has declined resources.    Transition of Care Asessment: Insurance and Status: Insurance coverage has been reviewed Patient has primary care physician: Yes Home environment has been reviewed: Return to previous environment Prior level of function:: Independent Prior/Current Home Services: No current home services Social Determinants of Health Reivew: SDOH reviewed interventions complete Readmission risk has been reviewed: Yes Transition of care needs: no transition of care needs at this time

## 2023-04-30 DIAGNOSIS — A419 Sepsis, unspecified organism: Secondary | ICD-10-CM | POA: Diagnosis not present

## 2023-04-30 DIAGNOSIS — J189 Pneumonia, unspecified organism: Secondary | ICD-10-CM | POA: Diagnosis not present

## 2023-04-30 DIAGNOSIS — J441 Chronic obstructive pulmonary disease with (acute) exacerbation: Secondary | ICD-10-CM | POA: Diagnosis not present

## 2023-04-30 LAB — GLUCOSE, CAPILLARY
Glucose-Capillary: 190 mg/dL — ABNORMAL HIGH (ref 70–99)
Glucose-Capillary: 427 mg/dL — ABNORMAL HIGH (ref 70–99)
Glucose-Capillary: 355 mg/dL — ABNORMAL HIGH (ref 70–99)
Glucose-Capillary: 205 mg/dL — ABNORMAL HIGH (ref 70–99)

## 2023-04-30 LAB — CBC WITH DIFFERENTIAL/PLATELET
Abs Immature Granulocytes: 0.13 10*3/uL — ABNORMAL HIGH (ref 0.00–0.07)
Basophils Absolute: 0 10*3/uL (ref 0.0–0.1)
Basophils Relative: 0 %
Eosinophils Absolute: 0 10*3/uL (ref 0.0–0.5)
Eosinophils Relative: 0 %
HCT: 37.9 % (ref 36.0–46.0)
Hemoglobin: 11.7 g/dL — ABNORMAL LOW (ref 12.0–15.0)
Immature Granulocytes: 1 %
Lymphocytes Relative: 19 %
Lymphs Abs: 2.8 10*3/uL (ref 0.7–4.0)
MCH: 27.6 pg (ref 26.0–34.0)
MCHC: 30.9 g/dL (ref 30.0–36.0)
MCV: 89.4 fL (ref 80.0–100.0)
Monocytes Absolute: 0.9 10*3/uL (ref 0.1–1.0)
Monocytes Relative: 6 %
Neutro Abs: 11.2 10*3/uL — ABNORMAL HIGH (ref 1.7–7.7)
Neutrophils Relative %: 74 %
Platelets: 271 10*3/uL (ref 150–400)
RBC: 4.24 MIL/uL (ref 3.87–5.11)
RDW: 13.7 % (ref 11.5–15.5)
WBC: 15.1 10*3/uL — ABNORMAL HIGH (ref 4.0–10.5)
nRBC: 0 % (ref 0.0–0.2)

## 2023-04-30 MED ORDER — PREDNISONE 20 MG PO TABS
40.0000 mg | ORAL_TABLET | Freq: Every day | ORAL | Status: AC
  Administered 2023-04-30 – 2023-05-01 (×2): 40 mg via ORAL
  Filled 2023-04-30 (×2): qty 2

## 2023-04-30 MED ORDER — ROSUVASTATIN CALCIUM 10 MG PO TABS
5.0000 mg | ORAL_TABLET | Freq: Every day | ORAL | Status: DC
  Administered 2023-04-30 – 2023-05-01 (×2): 5 mg via ORAL
  Filled 2023-04-30 (×2): qty 1

## 2023-04-30 MED ORDER — GUAIFENESIN-DM 100-10 MG/5ML PO SYRP
5.0000 mL | ORAL_SOLUTION | ORAL | Status: DC | PRN
  Administered 2023-04-30 – 2023-05-02 (×6): 5 mL via ORAL
  Filled 2023-04-30 (×6): qty 10

## 2023-04-30 MED ORDER — HYDROCODONE BIT-HOMATROP MBR 5-1.5 MG/5ML PO SOLN
5.0000 mL | Freq: Four times a day (QID) | ORAL | Status: DC | PRN
  Filled 2023-04-30: qty 5

## 2023-04-30 MED ORDER — HYDROCOD POLI-CHLORPHE POLI ER 10-8 MG/5ML PO SUER
5.0000 mL | Freq: Two times a day (BID) | ORAL | Status: DC | PRN
  Administered 2023-05-01: 5 mL via ORAL
  Filled 2023-04-30: qty 5

## 2023-04-30 NOTE — Progress Notes (Signed)
Spent some time with patient and daughter in room, offered compassionate presence and listening ear. Offered prayer over family. They are patiently waiting to get paperwork notarized for AD. Chaplain services will follow up in afternoon. Efforts are being made to get a notary for them.

## 2023-04-30 NOTE — Care Management Important Message (Signed)
Important Message  Patient Details  Name: Jasmine Larsen MRN: [id number] Date of Birth: 1966/09/15   Medicare Important Message Given:  Yes     [name] 04/30/2023, 2:21 PM

## 2023-04-30 NOTE — Progress Notes (Signed)
Progress Note    Jasmine Larsen  UVO:[id number] DOB: 1966/04/08  DOA: 04/27/2023 PCP: Eden Emms, NP      Brief Narrative:    Medical records reviewed and are as summarized below:  Jasmine Larsen is a 57 y.o. female with medical history significant for COPD, asthma, chronic diastolic CHF, type II DM, gastric ulcers, morbid obesity, tobacco use disorder since age 89, who presented to the hospital with cough productive of dark greenish sputum, worsening shortness of breath and wheezing.        Assessment/Plan:   Principal Problem:   Sepsis due to pneumonia [facility]) Active Problems:   Acute respiratory failure with hypoxia (HCC)   COPD with acute exacerbation (HCC)   Chronic diastolic CHF (congestive heart failure) (HCC)   Uncontrolled type 2 diabetes mellitus with hyperglycemia (HCC)   Morbid obesity (HCC)   Tobacco abuse    Body mass index is 59.39 kg/m.  (Morbid obesity)   Sepsis secondary to pneumonia: Continue azithromycin and IV ceftriaxone.  Gram-positive rod bacteremia.  1 out of 4 blood culture bottles showed gram-positive rods.  Follow-up blood cultures.   COPD exacerbation: Change IV Solu-Medrol to oral prednisone.  Continue bronchodilators.  Add Robitussin DM and Hycodan as needed for cough.   Acute hypoxic respiratory failure: Oxygen therapy has been weaned down from 4 L to 2 L/min.  Wean off oxygen as able.  Wean off oxygen as able.   Chronic diastolic CHF: Compensated   Type II DM with hyperglycemia: Increase Semglee from 15 units to 20 units nightly.  Continue NovoLog as needed for hyperglycemia. Hemoglobin A1c was 8.3 on 03/31/2023. (She is on metformin and semaglutide at home)   Tobacco use disorder: She said she has been smoking since she was 57 years old.  She used to smoke about 5 packs of cigarettes a day.  She cut down smoking to 3 packs a day and she went down to 2 packs a day for about 2 months prior to admission. She has been  counseled to quit smoking cigarettes.  She said she is still trying. Continue nicotine patch Chart review showed that she had low-dose CT chest for lung cancer screening on 02/06/2023 which showed "small solid pulmonary nodule of the right upper lobe measuring 4.2 mm, unchanged when compared with the prior".  Annual screening with low-dose CT chest in 12 months was recommended.     Diet Order             Diet heart healthy/carb modified Room service appropriate? Yes; Fluid consistency: Thin  Diet effective now                            Consultants: None  Procedures: None    Medications:    azithromycin  500 mg Oral QHS   enoxaparin (LOVENOX) injection  75 mg Subcutaneous Q24H   fluticasone furoate-vilanterol  1 puff Inhalation Daily   gabapentin  300 mg Oral TID   insulin aspart  0-20 Units Subcutaneous TID WC   insulin glargine-yfgn  15 Units Subcutaneous QHS   ipratropium-albuterol  3 mL Nebulization TID   predniSONE  40 mg Oral Q breakfast   rosuvastatin  5 mg Oral Daily   sodium chloride flush  3 mL Intravenous Q12H   Continuous Infusions:  cefTRIAXone (ROCEPHIN)  IV 2 g (04/29/23 2202)     Anti-infectives (From admission, onward)    Start  Dose/Rate Route Frequency Ordered Stop   04/29/23 2200  azithromycin (ZITHROMAX) tablet 500 mg        500 mg Oral Daily at bedtime 04/29/23 0808 05/02/23 2159   04/28/23 2200  cefTRIAXone (ROCEPHIN) 2 g in sodium chloride 0.9 % 100 mL IVPB        2 g 200 mL/hr over 30 Minutes Intravenous Every 24 hours 04/27/23 2300 05/02/23 2159   04/28/23 2200  azithromycin (ZITHROMAX) 500 mg in sodium chloride 0.9 % 250 mL IVPB  Status:  Discontinued        500 mg 250 mL/hr over 60 Minutes Intravenous Every 24 hours 04/27/23 2300 04/29/23 0808   04/27/23 2100  azithromycin (ZITHROMAX) 500 mg in sodium chloride 0.9 % 250 mL IVPB        500 mg 250 mL/hr over 60 Minutes Intravenous  Once 04/27/23 2057 04/27/23 2314    04/27/23 2100  cefTRIAXone (ROCEPHIN) 1 g in sodium chloride 0.9 % 100 mL IVPB        1 g 200 mL/hr over 30 Minutes Intravenous  Once 04/27/23 2057 04/27/23 2156              Family Communication/Anticipated D/C date and plan/Code Status   DVT prophylaxis:      Code Status: Full Code  Family Communication: None Disposition Plan: Plan to discharge home in 1 to 2   days   Status is: Inpatient Remains inpatient appropriate because: Acute hypoxic respiratory failure, COPD exacerbation, on IV antibiotics for sepsis/pneumonia       Subjective:   Interval events noted.  She complains of cough productive of greenish sputum.  Cough is keeping her awake at night.  She still feels short of breath especially with exertion.  She is still wheezing.  Objective:    Vitals:   04/30/23 0420 04/30/23 0741 04/30/23 0756 04/30/23 1323  BP: 111/72 138/86  132/75  Pulse: 83 79  88  Resp: 19 16  16   Temp: 97.9 F (36.6 C) 97.9 F (36.6 C)  98.1 F (36.7 C)  TempSrc:      SpO2: 98% 97% 97% 97%  Weight:      Height:       No data found.   Intake/Output Summary (Last 24 hours) at 04/30/2023 1434 Last data filed at 04/30/2023 1052 Gross per 24 hour  Intake 243 ml  Output --  Net 243 ml   Filed Weights   04/27/23 1959  Weight: (!) 156.9 kg    Exam:  GEN: NAD SKIN: Warm and dry EYES: No pallor or icterus ENT: MMM CV: RRR PULM: Decreased air entry bilaterally.  Bilateral expiratory wheezing.  No rales heard ABD: soft, obese, NT, +BS CNS: AAO x 3, non focal EXT: Trace bilateral leg edema, no tenderness    Data Reviewed:   I have personally reviewed following labs and imaging studies:  Labs: Labs show the following:   Basic Metabolic Panel: Recent Labs  Lab 04/27/23 2023 04/28/23 0603 04/29/23 0334  NA 129* 134* 135  K 3.8 3.9 3.9  CL 96* 100 101  CO2 20* 23 26  GLUCOSE 368* 371* 245*  BUN 12 13 23*  CREATININE 0.90 0.83 0.72  CALCIUM 8.3* 8.1* 7.9*    GFR Estimated Creatinine Clearance: 119.9 mL/min (by C-G formula based on SCr of 0.72 mg/dL). Liver Function Tests: No results for input(s): "AST", "ALT", "ALKPHOS", "BILITOT", "PROT", "ALBUMIN" in the last 168 hours. No results for input(s): "LIPASE", "AMYLASE" in the last  168 hours. No results for input(s): "AMMONIA" in the last 168 hours. Coagulation profile No results for input(s): "INR", "PROTIME" in the last 168 hours.  CBC: Recent Labs  Lab 04/27/23 2023 04/28/23 0603 04/29/23 0334 04/30/23 0245  WBC 14.6* 13.1* 17.5* 15.1*  NEUTROABS  --   --   --  11.2*  HGB 14.0 12.7 12.4 11.7*  HCT 43.8 39.2 38.8 37.9  MCV 87.1 83.9 86.4 89.4  PLT 289 270 268 271   Cardiac Enzymes: No results for input(s): "CKTOTAL", "CKMB", "CKMBINDEX", "TROPONINI" in the last 168 hours. BNP (last 3 results) Recent Labs    12/15/22 0930  PROBNP 15.0   CBG: Recent Labs  Lab 04/29/23 1224 04/29/23 1628 04/29/23 2153 04/30/23 0742 04/30/23 1324  GLUCAP 252* 368* 159* 190* 427*   D-Dimer: No results for input(s): "DDIMER" in the last 72 hours. Hgb A1c: No results for input(s): "HGBA1C" in the last 72 hours. Lipid Profile: No results for input(s): "CHOL", "HDL", "LDLCALC", "TRIG", "CHOLHDL", "LDLDIRECT" in the last 72 hours. Thyroid function studies: No results for input(s): "TSH", "T4TOTAL", "T3FREE", "THYROIDAB" in the last 72 hours.  Invalid input(s): "FREET3" Anemia work up: No results for input(s): "VITAMINB12", "FOLATE", "FERRITIN", "TIBC", "IRON", "RETICCTPCT" in the last 72 hours. Sepsis Labs: Recent Labs  Lab 04/27/23 2020 04/27/23 2023 04/27/23 2206 04/28/23 0603 04/29/23 0334 04/30/23 0245  PROCALCITON 0.14  --   --   --   --   --   WBC  --  14.6*  --  13.1* 17.5* 15.1*  LATICACIDVEN  --  2.1* 1.5  --   --   --     Microbiology Recent Results (from the past 240 hour(s))  Resp panel by RT-PCR (RSV, Flu A&B, Covid) Anterior Nasal Swab     Status: None    Collection Time: 04/27/23  8:20 PM   Specimen: Anterior Nasal Swab  Result Value Ref Range Status   SARS Coronavirus 2 by RT PCR NEGATIVE NEGATIVE Final    Comment: (NOTE) SARS-CoV-2 target nucleic acids are NOT DETECTED.  The SARS-CoV-2 RNA is generally detectable in upper respiratory specimens during the acute phase of infection. The lowest concentration of SARS-CoV-2 viral copies this assay can detect is 138 copies/mL. A negative result does not preclude SARS-Cov-2 infection and should not be used as the sole basis for treatment or other patient management decisions. A negative result may occur with  improper specimen collection/handling, submission of specimen other than nasopharyngeal swab, presence of viral mutation(s) within the areas targeted by this assay, and inadequate number of viral copies(<138 copies/mL). A negative result must be combined with clinical observations, patient history, and epidemiological information. The expected result is Negative.  Fact Sheet for Patients:  BloggerCourse.com  Fact Sheet for Healthcare Providers:  SeriousBroker.it  This test is no t yet approved or cleared by the [place] FDA and  has been authorized for detection and/or diagnosis of SARS-CoV-2 by FDA under an Emergency Use Authorization (EUA). This EUA will remain  in effect (meaning this test can be used) for the duration of the COVID-19 declaration under Section 564(b)(1) of the Act, 21 U.S.C.section 360bbb-3(b)(1), unless the authorization is terminated  or revoked sooner.       Influenza A by PCR NEGATIVE NEGATIVE Final   Influenza B by PCR NEGATIVE NEGATIVE Final    Comment: (NOTE) The Xpert Xpress SARS-CoV-2/FLU/RSV plus assay is intended as an aid in the diagnosis of influenza from Nasopharyngeal swab specimens and should not be  used as a sole basis for treatment. Nasal washings and aspirates are unacceptable for  Xpert Xpress SARS-CoV-2/FLU/RSV testing.  Fact Sheet for Patients: BloggerCourse.com  Fact Sheet for Healthcare Providers: SeriousBroker.it  This test is not yet approved or cleared by the [place] FDA and has been authorized for detection and/or diagnosis of SARS-CoV-2 by FDA under an Emergency Use Authorization (EUA). This EUA will remain in effect (meaning this test can be used) for the duration of the COVID-19 declaration under Section 564(b)(1) of the Act, 21 U.S.C. section 360bbb-3(b)(1), unless the authorization is terminated or revoked.     Resp Syncytial Virus by PCR NEGATIVE NEGATIVE Final    Comment: (NOTE) Fact Sheet for Patients: BloggerCourse.com  Fact Sheet for Healthcare Providers: SeriousBroker.it  This test is not yet approved or cleared by the [place] FDA and has been authorized for detection and/or diagnosis of SARS-CoV-2 by FDA under an Emergency Use Authorization (EUA). This EUA will remain in effect (meaning this test can be used) for the duration of the COVID-19 declaration under Section 564(b)(1) of the Act, 21 U.S.C. section 360bbb-3(b)(1), unless the authorization is terminated or revoked.  Performed at [facility], [address] [place], [place] [address]   Blood culture (single)     Status: None (Preliminary result)   Collection Time: 04/27/23  8:23 PM   Specimen: BLOOD  Result Value Ref Range Status   Specimen Description BLOOD BLOOD LEFT ARM  Final   Special Requests   Final    BOTTLES DRAWN AEROBIC AND ANAEROBIC Blood Culture adequate volume   Culture   Final    NO GROWTH 3 DAYS Performed at [facility], [address] [place], [place] [address]    Report Status PENDING  Incomplete  Blood Culture (routine x 2)     Status: None (Preliminary result)   Collection Time: 04/27/23  9:30 PM   Specimen: BLOOD  RIGHT FOREARM  Result Value Ref Range Status   Specimen Description   Final    BLOOD RIGHT FOREARM Performed at [facility], [address] [place], [place] [address]    Special Requests   Final    BOTTLES DRAWN AEROBIC AND ANAEROBIC Blood Culture adequate volume Performed at [facility], [address] [place], [place] [address]    Culture  Setup Time   Final    GRAM POSITIVE RODS AEROBIC BOTTLE ONLY CRITICAL RESULT CALLED TO, READ BACK BY AND VERIFIED WITH: CARIS[facility] AT 1953 ON 04/28/23 BY SS Performed at [facility], [address], [place], [place] [address]    Culture   Final    Romie Minus POSITIVE RODS IDENTIFICATION TO FOLLOW Performed at [facility], [address] [place], [place] [address]    Report Status PENDING  Incomplete  Respiratory (~20 pathogens) panel by PCR     Status: None   Collection Time: 04/28/23  3:45 AM   Specimen: Nasopharyngeal Swab; Respiratory  Result Value Ref Range Status   Adenovirus NOT DETECTED NOT DETECTED Final   Coronavirus 229E NOT DETECTED NOT DETECTED Final    Comment: (NOTE) The Coronavirus on the Respiratory Panel, DOES NOT test for the novel  Coronavirus (2019 nCoV)    Coronavirus HKU1 NOT DETECTED NOT DETECTED Final   Coronavirus NL63 NOT DETECTED NOT DETECTED Final   Coronavirus OC43 NOT DETECTED NOT DETECTED Final   Metapneumovirus NOT DETECTED NOT DETECTED Final   Rhinovirus / Enterovirus NOT DETECTED NOT DETECTED Final  Influenza A NOT DETECTED NOT DETECTED Final   Influenza B NOT DETECTED NOT DETECTED Final   Parainfluenza Virus 1 NOT DETECTED NOT DETECTED Final   Parainfluenza Virus 2 NOT DETECTED NOT DETECTED Final   Parainfluenza Virus 3 NOT DETECTED NOT DETECTED Final   Parainfluenza Virus 4 NOT DETECTED NOT DETECTED Final   Respiratory Syncytial Virus NOT DETECTED NOT DETECTED Final   Bordetella pertussis NOT DETECTED NOT DETECTED Final   Bordetella Parapertussis NOT DETECTED NOT  DETECTED Final   Chlamydophila pneumoniae NOT DETECTED NOT DETECTED Final   Mycoplasma pneumoniae NOT DETECTED NOT DETECTED Final    Comment: Performed at [facility], [address], [place], [place] [address]    Procedures and diagnostic studies:  No results found.             LOS: 3 days   Preet Mangano  [facility]   Pager on [email or web address]. If 7PM-7AM, please contact night-coverage at [email or web address]     04/30/2023, 2:34 PM

## 2023-04-30 NOTE — Plan of Care (Signed)

## 2023-05-01 DIAGNOSIS — A419 Sepsis, unspecified organism: Secondary | ICD-10-CM | POA: Diagnosis not present

## 2023-05-01 DIAGNOSIS — J189 Pneumonia, unspecified organism: Secondary | ICD-10-CM | POA: Diagnosis not present

## 2023-05-01 LAB — GLUCOSE, CAPILLARY
Glucose-Capillary: 151 mg/dL — ABNORMAL HIGH (ref 70–99)
Glucose-Capillary: 252 mg/dL — ABNORMAL HIGH (ref 70–99)
Glucose-Capillary: 343 mg/dL — ABNORMAL HIGH (ref 70–99)
Glucose-Capillary: 233 mg/dL — ABNORMAL HIGH (ref 70–99)

## 2023-05-01 LAB — CULTURE, BLOOD (ROUTINE X 2): Special Requests: ADEQUATE

## 2023-05-01 LAB — CULTURE, BLOOD (SINGLE): Culture: NO GROWTH

## 2023-05-01 MED ORDER — INSULIN ASPART 100 UNIT/ML IJ SOLN
4.0000 [IU] | Freq: Three times a day (TID) | INTRAMUSCULAR | Status: AC
  Administered 2023-05-01 – 2023-05-02 (×3): 4 [IU] via SUBCUTANEOUS
  Filled 2023-05-01 (×2): qty 1

## 2023-05-01 MED ORDER — INSULIN ASPART 100 UNIT/ML IJ SOLN
INTRAMUSCULAR | Status: AC
  Filled 2023-05-01: qty 1

## 2023-05-01 MED ORDER — HYDROCODONE-ACETAMINOPHEN 5-325 MG PO TABS
1.0000 | ORAL_TABLET | Freq: Four times a day (QID) | ORAL | Status: DC | PRN
  Administered 2023-05-01 – 2023-05-02 (×4): 1 via ORAL
  Filled 2023-05-01 (×4): qty 1

## 2023-05-01 MED ORDER — HYDROCODONE-ACETAMINOPHEN 5-325 MG PO TABS
ORAL_TABLET | ORAL | Status: AC
  Filled 2023-05-01: qty 1

## 2023-05-01 NOTE — Progress Notes (Signed)
SATURATION QUALIFICATIONS: (This note is used to comply with regulatory documentation for home oxygen)  Patient Saturations on Room Air at Rest = 95%  Patient Saturations on Room Air while Ambulating = 95%   

## 2023-05-01 NOTE — Plan of Care (Signed)
  Problem: Fluid Volume: Goal: Hemodynamic stability will improve Outcome: Progressing   Problem: Respiratory: Goal: Ability to maintain adequate ventilation will improve Outcome: Progressing   Problem: Education: Goal: Ability to describe self-care measures that may prevent or decrease complications (Diabetes Survival Skills Education) will improve Outcome: Progressing   Problem: Coping: Goal: Ability to adjust to condition or change in health will improve Outcome: Progressing   Problem: Fluid Volume: Goal: Ability to maintain a balanced intake and output will improve Outcome: Progressing   Problem: Metabolic: Goal: Ability to maintain appropriate glucose levels will improve Outcome: Progressing   Problem: Nutritional: Goal: Maintenance of adequate nutrition will improve Outcome: Progressing   Problem: Skin Integrity: Goal: Risk for impaired skin integrity will decrease Outcome: Progressing   Problem: Tissue Perfusion: Goal: Adequacy of tissue perfusion will improve Outcome: Progressing   Problem: Activity: Goal: Risk for activity intolerance will decrease Outcome: Progressing

## 2023-05-01 NOTE — Inpatient Diabetes Management (Addendum)
Inpatient Diabetes Program Recommendations  AACE/ADA: New Consensus Statement on Inpatient Glycemic Control (2015)  Target Ranges:  Prepandial:   less than 140 mg/dL      Peak postprandial:   less than 180 mg/dL (1-2 hours)      Critically ill patients:  140 - 180 mg/dL   Lab Results  Component Value Date   GLUCAP 151 (H) 05/01/2023   HGBA1C 8.3 (A) 03/31/2023    Review of Glycemic Control  Latest Reference Range & Units 04/30/23 07:42 04/30/23 13:24 04/30/23 15:39 04/30/23 21:04 05/01/23 07:53  Glucose-Capillary 70 - 99 mg/dL 644 (H) 034 (H) 742 (H) 205 (H) 151 (H)  (H): Data is abnormally high  Diabetes history: DM2 Outpatient Diabetes medications:  Metformin 1000 mg BID Ozempic weekly Current orders for Inpatient glycemic control:  Novolog 0-20 units TID Semglee 15 units QHS  Inpatient Diabetes Program Recommendations:    Postprandials elevated.  Please consider:  Novolog 4 units TID with meals if she consumes at least 50%  Will continue to follow while inpatient.  Thank you, Dulce Sellar, MSN, CDCES Diabetes Coordinator Inpatient Diabetes Program [phone] (team pager from 8a-5p)

## 2023-05-01 NOTE — TOC Progression Note (Signed)
Transition of Care [facility]) - Progression Note    Patient Details  Name: Jasmine Larsen MRN: [id number] Date of Birth: 1966-05-20  Transition of Care [facility] CM/SW Contact  Liliana Cline, LCSW Phone Number: 05/01/2023, 3:59 PM  Clinical Narrative:    Notified by Barbara Cower with [facility] that they got a referral from [facility] for [facility] during this admission.         Expected Discharge Plan and Services                                               Social Determinants of Health (SDOH) Interventions SDOH Screenings   Food Insecurity: No Food Insecurity (04/28/2023)  Housing: High Risk (04/28/2023)  Transportation Needs: Patient Declined (04/28/2023)  Utilities: Patient Declined (04/28/2023)  Depression (PHQ2-9): Medium Risk (03/31/2023)  Tobacco Use: High Risk (03/31/2023)    Readmission Risk Interventions     No data to display

## 2023-05-01 NOTE — Progress Notes (Addendum)
Progress Note    MARELY APGAR  ZOX:[id number] DOB: Feb 27, 1966  DOA: 04/27/2023 PCP: Eden Emms, NP      Brief Narrative:    Medical records reviewed and are as summarized below:  Jasmine Larsen is a 57 y.o. female with medical history significant for COPD, asthma, chronic diastolic CHF, type II DM, gastric ulcers, morbid obesity, tobacco use disorder since age 44, who presented to the hospital with cough productive of dark greenish sputum, worsening shortness of breath and wheezing.        Assessment/Plan:   Principal Problem:   Sepsis due to pneumonia [facility]) Active Problems:   Acute respiratory failure with hypoxia (HCC)   COPD with acute exacerbation (HCC)   Chronic diastolic CHF (congestive heart failure) (HCC)   Uncontrolled type 2 diabetes mellitus with hyperglycemia (HCC)   Morbid obesity (HCC)   Tobacco abuse    Body mass index is 59.39 kg/m.  (Morbid obesity)   Sepsis secondary to pneumonia: Continue azithromycin and IV ceftriaxone.  Corynebacterium minutissimum bacteremia.  1 out of 4 blood culture bottles showed  Corynebacterium minutissimum. Suspect this is a contaminant.   COPD exacerbation: Continue Prednisone.  Continue bronchodilators and antitussives as needed for cough.   Acute hypoxic respiratory failure: She's on 3L/min oxygen. She may need home oxygen if unable to wean.    Wean off oxygen as able.   Chronic diastolic CHF: Compensated   Type II DM with hyperglycemia: Continue Semglee 15 u at bedtime. Add Novolog 4 u tid with meals.  Continue NovoLog as needed for hyperglycemia. Hemoglobin A1c was 8.3 on 03/31/2023. (She is on metformin and semaglutide at home)   Tobacco use disorder: She said she has been smoking since she was 57 years old.  She used to smoke about 5 packs of cigarettes a day.  She cut down smoking to 3 packs a day and she went down to 2 packs a day for about 2 months prior to admission. She has been counseled  to quit smoking cigarettes.  She said she is still trying. Continue nicotine patch Chart review showed that she had low-dose CT chest for lung cancer screening on 02/06/2023 which showed "small solid pulmonary nodule of the right upper lobe measuring 4.2 mm, unchanged when compared with the prior".  Annual screening with low-dose CT chest in 12 months was recommended.     Diet Order             Diet heart healthy/carb modified Room service appropriate? Yes; Fluid consistency: Thin  Diet effective now                            Consultants: None  Procedures: None    Medications:    azithromycin  500 mg Oral QHS   enoxaparin (LOVENOX) injection  75 mg Subcutaneous Q24H   fluticasone furoate-vilanterol  1 puff Inhalation Daily   gabapentin  300 mg Oral TID   insulin aspart  0-20 Units Subcutaneous TID WC   insulin glargine-yfgn  15 Units Subcutaneous QHS   ipratropium-albuterol  3 mL Nebulization TID   rosuvastatin  5 mg Oral QHS   sodium chloride flush  3 mL Intravenous Q12H   Continuous Infusions:  cefTRIAXone (ROCEPHIN)  IV Stopped (05/01/23 8119)     Anti-infectives (From admission, onward)    Start     Dose/Rate Route Frequency Ordered Stop   04/29/23 2200  azithromycin (ZITHROMAX) tablet  500 mg        500 mg Oral Daily at bedtime 04/29/23 0808 05/02/23 2159   04/28/23 2200  cefTRIAXone (ROCEPHIN) 2 g in sodium chloride 0.9 % 100 mL IVPB        2 g 200 mL/hr over 30 Minutes Intravenous Every 24 hours 04/27/23 2300 05/02/23 2159   04/28/23 2200  azithromycin (ZITHROMAX) 500 mg in sodium chloride 0.9 % 250 mL IVPB  Status:  Discontinued        500 mg 250 mL/hr over 60 Minutes Intravenous Every 24 hours 04/27/23 2300 04/29/23 0808   04/27/23 2100  azithromycin (ZITHROMAX) 500 mg in sodium chloride 0.9 % 250 mL IVPB        500 mg 250 mL/hr over 60 Minutes Intravenous  Once 04/27/23 2057 04/27/23 2314   04/27/23 2100  cefTRIAXone (ROCEPHIN) 1 g in sodium  chloride 0.9 % 100 mL IVPB        1 g 200 mL/hr over 30 Minutes Intravenous  Once 04/27/23 2057 04/27/23 2156              Family Communication/Anticipated D/C date and plan/Code Status   DVT prophylaxis:      Code Status: Full Code  Family Communication: Plan discussed with Jamesetta Orleans, daughter at the bedside Disposition Plan: Plan to discharge home tomorrow   Status is: Inpatient Remains inpatient appropriate because: Acute hypoxic respiratory failure, COPD exacerbation, on IV antibiotics for sepsis/pneumonia       Subjective:   Interval events noted. She c/o back pain from poor positioning in the bed. Breathing is a little better. Tussionex helped with cough last night. Jamesetta Orleans, daughter, was at the bedside.   Objective:    Vitals:   04/30/23 2012 04/30/23 2042 05/01/23 0430 05/01/23 0730  BP: (!) 147/71  116/63 (!) 106/56  Pulse: 83  76 87  Resp: 16  16 16   Temp: 97.6 F (36.4 C)  98.1 F (36.7 C) 98 F (36.7 C)  TempSrc:   Oral Oral  SpO2: 97% 98% 100% 96%  Weight:      Height:       No data found.   Intake/Output Summary (Last 24 hours) at 05/01/2023 1004 Last data filed at 04/30/2023 1454 Gross per 24 hour  Intake 480 ml  Output --  Net 480 ml   Filed Weights   04/27/23 1959  Weight: (!) 156.9 kg    Exam:  GEN: NAD SKIN: No rash EYES: EOMI ENT: MMM CV: RRR PULM: Improved air entry. B/l wheezing present but improving ABD: soft, obese, NT, +BS CNS: AAO x 3, non focal EXT: Trace b/l leg edema, no tenderness   Data Reviewed:   I have personally reviewed following labs and imaging studies:  Labs: Labs show the following:   Basic Metabolic Panel: Recent Labs  Lab 04/27/23 2023 04/28/23 0603 04/29/23 0334  NA 129* 134* 135  K 3.8 3.9 3.9  CL 96* 100 101  CO2 20* 23 26  GLUCOSE 368* 371* 245*  BUN 12 13 23*  CREATININE 0.90 0.83 0.72  CALCIUM 8.3* 8.1* 7.9*   GFR Estimated Creatinine Clearance: 119.9 mL/min (by C-G  formula based on SCr of 0.72 mg/dL). Liver Function Tests: No results for input(s): "AST", "ALT", "ALKPHOS", "BILITOT", "PROT", "ALBUMIN" in the last 168 hours. No results for input(s): "LIPASE", "AMYLASE" in the last 168 hours. No results for input(s): "AMMONIA" in the last 168 hours. Coagulation profile No results for input(s): "INR", "PROTIME" in the last  168 hours.  CBC: Recent Labs  Lab 04/27/23 2023 04/28/23 0603 04/29/23 0334 04/30/23 0245  WBC 14.6* 13.1* 17.5* 15.1*  NEUTROABS  --   --   --  11.2*  HGB 14.0 12.7 12.4 11.7*  HCT 43.8 39.2 38.8 37.9  MCV 87.1 83.9 86.4 89.4  PLT 289 270 268 271   Cardiac Enzymes: No results for input(s): "CKTOTAL", "CKMB", "CKMBINDEX", "TROPONINI" in the last 168 hours. BNP (last 3 results) Recent Labs    12/15/22 0930  PROBNP 15.0   CBG: Recent Labs  Lab 04/30/23 0742 04/30/23 1324 04/30/23 1539 04/30/23 2104 05/01/23 0753  GLUCAP 190* 427* 355* 205* 151*   D-Dimer: No results for input(s): "DDIMER" in the last 72 hours. Hgb A1c: No results for input(s): "HGBA1C" in the last 72 hours. Lipid Profile: No results for input(s): "CHOL", "HDL", "LDLCALC", "TRIG", "CHOLHDL", "LDLDIRECT" in the last 72 hours. Thyroid function studies: No results for input(s): "TSH", "T4TOTAL", "T3FREE", "THYROIDAB" in the last 72 hours.  Invalid input(s): "FREET3" Anemia work up: No results for input(s): "VITAMINB12", "FOLATE", "FERRITIN", "TIBC", "IRON", "RETICCTPCT" in the last 72 hours. Sepsis Labs: Recent Labs  Lab 04/27/23 2020 04/27/23 2023 04/27/23 2206 04/28/23 0603 04/29/23 0334 04/30/23 0245  PROCALCITON 0.14  --   --   --   --   --   WBC  --  14.6*  --  13.1* 17.5* 15.1*  LATICACIDVEN  --  2.1* 1.5  --   --   --     Microbiology Recent Results (from the past 240 hour(s))  Resp panel by RT-PCR (RSV, Flu A&B, Covid) Anterior Nasal Swab     Status: None   Collection Time: 04/27/23  8:20 PM   Specimen: Anterior Nasal Swab   Result Value Ref Range Status   SARS Coronavirus 2 by RT PCR NEGATIVE NEGATIVE Final    Comment: (NOTE) SARS-CoV-2 target nucleic acids are NOT DETECTED.  The SARS-CoV-2 RNA is generally detectable in upper respiratory specimens during the acute phase of infection. The lowest concentration of SARS-CoV-2 viral copies this assay can detect is 138 copies/mL. A negative result does not preclude SARS-Cov-2 infection and should not be used as the sole basis for treatment or other patient management decisions. A negative result may occur with  improper specimen collection/handling, submission of specimen other than nasopharyngeal swab, presence of viral mutation(s) within the areas targeted by this assay, and inadequate number of viral copies(<138 copies/mL). A negative result must be combined with clinical observations, patient history, and epidemiological information. The expected result is Negative.  Fact Sheet for Patients:  BloggerCourse.com  Fact Sheet for Healthcare Providers:  SeriousBroker.it  This test is no t yet approved or cleared by the [place] FDA and  has been authorized for detection and/or diagnosis of SARS-CoV-2 by FDA under an Emergency Use Authorization (EUA). This EUA will remain  in effect (meaning this test can be used) for the duration of the COVID-19 declaration under Section 564(b)(1) of the Act, 21 U.S.C.section 360bbb-3(b)(1), unless the authorization is terminated  or revoked sooner.       Influenza A by PCR NEGATIVE NEGATIVE Final   Influenza B by PCR NEGATIVE NEGATIVE Final    Comment: (NOTE) The Xpert Xpress SARS-CoV-2/FLU/RSV plus assay is intended as an aid in the diagnosis of influenza from Nasopharyngeal swab specimens and should not be used as a sole basis for treatment. Nasal washings and aspirates are unacceptable for Xpert Xpress SARS-CoV-2/FLU/RSV testing.  Fact Sheet for  Patients: BloggerCourse.com  Fact Sheet for Healthcare Providers: SeriousBroker.it  This test is not yet approved or cleared by the [place] FDA and has been authorized for detection and/or diagnosis of SARS-CoV-2 by FDA under an Emergency Use Authorization (EUA). This EUA will remain in effect (meaning this test can be used) for the duration of the COVID-19 declaration under Section 564(b)(1) of the Act, 21 U.S.C. section 360bbb-3(b)(1), unless the authorization is terminated or revoked.     Resp Syncytial Virus by PCR NEGATIVE NEGATIVE Final    Comment: (NOTE) Fact Sheet for Patients: BloggerCourse.com  Fact Sheet for Healthcare Providers: SeriousBroker.it  This test is not yet approved or cleared by the [place] FDA and has been authorized for detection and/or diagnosis of SARS-CoV-2 by FDA under an Emergency Use Authorization (EUA). This EUA will remain in effect (meaning this test can be used) for the duration of the COVID-19 declaration under Section 564(b)(1) of the Act, 21 U.S.C. section 360bbb-3(b)(1), unless the authorization is terminated or revoked.  Performed at [facility], [address] [place], [place] [address]   Blood culture (single)     Status: None (Preliminary result)   Collection Time: 04/27/23  8:23 PM   Specimen: BLOOD  Result Value Ref Range Status   Specimen Description BLOOD BLOOD LEFT ARM  Final   Special Requests   Final    BOTTLES DRAWN AEROBIC AND ANAEROBIC Blood Culture adequate volume   Culture   Final    NO GROWTH 4 DAYS Performed at [facility], [address], [place], [place] [address]    Report Status PENDING  Incomplete  Blood Culture (routine x 2)     Status: None   Collection Time: 04/27/23  9:30 PM   Specimen: BLOOD RIGHT FOREARM  Result Value Ref Range Status   Specimen Description   Final     BLOOD RIGHT FOREARM Performed at [facility], [address], [place], [place] [address]    Special Requests   Final    BOTTLES DRAWN AEROBIC AND ANAEROBIC Blood Culture adequate volume Performed at [facility], [address] [place], [place] [address]    Culture  Setup Time   Final    GRAM POSITIVE RODS AEROBIC BOTTLE ONLY CRITICAL RESULT CALLED TO, READ BACK BY AND VERIFIED WITH: CARISSA [facility] AT 1953 ON 04/28/23 BY SS Performed at [facility], [address] [place], [place] [address]    Culture   Final    Corynebacterium minutissimum Standardized susceptibility testing for this organism is not available. Performed at [facility], [address], [place], [place] [address]    Report Status 05/01/2023 FINAL  Final  Respiratory (~20 pathogens) panel by PCR     Status: None   Collection Time: 04/28/23  3:45 AM   Specimen: Nasopharyngeal Swab; Respiratory  Result Value Ref Range Status   Adenovirus NOT DETECTED NOT DETECTED Final   Coronavirus 229E NOT DETECTED NOT DETECTED Final    Comment: (NOTE) The Coronavirus on the Respiratory Panel, DOES NOT test for the novel  Coronavirus (2019 nCoV)    Coronavirus HKU1 NOT DETECTED NOT DETECTED Final   Coronavirus NL63 NOT DETECTED NOT DETECTED Final   Coronavirus OC43 NOT DETECTED NOT DETECTED Final   Metapneumovirus NOT DETECTED NOT DETECTED Final   Rhinovirus / Enterovirus NOT DETECTED NOT DETECTED Final   Influenza A NOT DETECTED NOT DETECTED Final   Influenza B NOT DETECTED NOT DETECTED Final  Parainfluenza Virus 1 NOT DETECTED NOT DETECTED Final   Parainfluenza Virus 2 NOT DETECTED NOT DETECTED Final   Parainfluenza Virus 3 NOT DETECTED NOT DETECTED Final   Parainfluenza Virus 4 NOT DETECTED NOT DETECTED Final   Respiratory Syncytial Virus NOT DETECTED NOT DETECTED Final   Bordetella pertussis NOT DETECTED NOT DETECTED Final   Bordetella Parapertussis NOT DETECTED NOT DETECTED Final    Chlamydophila pneumoniae NOT DETECTED NOT DETECTED Final   Mycoplasma pneumoniae NOT DETECTED NOT DETECTED Final    Comment: Performed at [facility], [address], [place], [place] [address]    Procedures and diagnostic studies:  No results found.             LOS: 4 days   Berneta Sconyers  [facility]   Pager on [email or web address]. If 7PM-7AM, please contact night-coverage at [email or web address]     05/01/2023, 10:04 AM

## 2023-05-02 ENCOUNTER — Encounter: Payer: Self-pay | Admitting: Internal Medicine

## 2023-05-02 ENCOUNTER — Other Ambulatory Visit: Payer: Self-pay | Admitting: Internal Medicine

## 2023-05-02 DIAGNOSIS — J441 Chronic obstructive pulmonary disease with (acute) exacerbation: Secondary | ICD-10-CM | POA: Diagnosis not present

## 2023-05-02 DIAGNOSIS — A419 Sepsis, unspecified organism: Secondary | ICD-10-CM | POA: Diagnosis not present

## 2023-05-02 DIAGNOSIS — J189 Pneumonia, unspecified organism: Secondary | ICD-10-CM | POA: Diagnosis not present

## 2023-05-02 LAB — CBC WITH DIFFERENTIAL/PLATELET
Abs Immature Granulocytes: 0.21 10*3/uL — ABNORMAL HIGH (ref 0.00–0.07)
Basophils Absolute: 0.1 10*3/uL (ref 0.0–0.1)
Basophils Relative: 0 %
Eosinophils Absolute: 0.1 10*3/uL (ref 0.0–0.5)
Eosinophils Relative: 1 %
HCT: 35.6 % — ABNORMAL LOW (ref 36.0–46.0)
Hemoglobin: 11.6 g/dL — ABNORMAL LOW (ref 12.0–15.0)
Immature Granulocytes: 2 %
Lymphocytes Relative: 27 %
Lymphs Abs: 3.6 10*3/uL (ref 0.7–4.0)
MCH: 28.2 pg (ref 26.0–34.0)
MCHC: 32.6 g/dL (ref 30.0–36.0)
MCV: 86.4 fL (ref 80.0–100.0)
Monocytes Absolute: 0.9 10*3/uL (ref 0.1–1.0)
Monocytes Relative: 7 %
Neutro Abs: 8.6 10*3/uL — ABNORMAL HIGH (ref 1.7–7.7)
Neutrophils Relative %: 63 %
Platelets: 275 10*3/uL (ref 150–400)
RBC: 4.12 MIL/uL (ref 3.87–5.11)
RDW: 13.6 % (ref 11.5–15.5)
WBC: 13.6 10*3/uL — ABNORMAL HIGH (ref 4.0–10.5)
nRBC: 0 % (ref 0.0–0.2)

## 2023-05-02 LAB — BASIC METABOLIC PANEL
Anion gap: 6 (ref 5–15)
BUN: 16 mg/dL (ref 6–20)
CO2: 27 mmol/L (ref 22–32)
Calcium: 7.5 mg/dL — ABNORMAL LOW (ref 8.9–10.3)
Chloride: 102 mmol/L (ref 98–111)
Creatinine, Ser: 0.57 mg/dL (ref 0.44–1.00)
GFR, Estimated: >60 mL/min (ref >60)
Glucose, Bld: 216 mg/dL — ABNORMAL HIGH (ref 70–99)
Potassium: 4.3 mmol/L (ref 3.5–5.1)
Sodium: 135 mmol/L (ref 135–145)

## 2023-05-02 LAB — CULTURE, BLOOD (SINGLE)
Culture: NO GROWTH
Special Requests: ADEQUATE

## 2023-05-02 LAB — GLUCOSE, CAPILLARY: Glucose-Capillary: 135 mg/dL — ABNORMAL HIGH (ref 70–99)

## 2023-05-02 MED ORDER — HYDROCOD POLI-CHLORPHE POLI ER 10-8 MG/5ML PO SUER: 5.0000 mL | Freq: Two times a day (BID) | ORAL | 0 refills | Status: DC | PRN

## 2023-05-02 NOTE — Progress Notes (Unsigned)
{  [facility]_[email or web address]:[id number]} 

## 2023-05-02 NOTE — Progress Notes (Signed)
Patient and daughter verbalized understanding of discharge instructions, including follow up appointment. Emphasized the importance of NOT smoking with use of nicotine patches and/or gum.

## 2023-05-02 NOTE — Progress Notes (Signed)
Patient OOF via w/c in stable condition to private vehicle

## 2023-05-02 NOTE — Discharge Summary (Addendum)
Physician Discharge Summary   Patient: Jasmine Larsen MRN: [id number] DOB: January 31, 1966  Admit date:     04/27/2023  Discharge date: 05/02/23  Discharge Physician: Lurene Shadow   PCP: Eden Emms, NP   Recommendations at discharge:   Follow-up with PCP in 1 week  Discharge Diagnoses: Principal Problem:   Sepsis due to pneumonia [facility]) Active Problems:   Acute respiratory failure with hypoxia (HCC)   COPD with acute exacerbation (HCC)   Chronic diastolic CHF (congestive heart failure) (HCC)   Uncontrolled type 2 diabetes mellitus with hyperglycemia (HCC)   Morbid obesity (HCC)   Tobacco abuse  Resolved Problems:   * No resolved hospital problems. *  Hospital Course:  Jasmine Larsen is a 57 y.o. 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  Corynebacterium minutissimum bacteremia.  1 out of 4 blood culture bottles showed  Corynebacterium minutissimum. Suspect this is a contaminant.  No growth on repeat blood cultures.     COPD exacerbation: Completed course of steroids.  Continue bronchodilators as needed at home.  She requested Tussionex for cough at home.      Acute hypoxic respiratory failure: Resolved.  She is tolerating room air even with ambulation.     Chronic diastolic CHF: Compensated     Type II DM with hyperglycemia: Resume metformin and semaglutide at discharge      Tobacco use disorder: She said she has been smoking since she was 57 years old.  She used to smoke about 5 packs of cigarettes a day.  She cut down smoking to 3 packs a day and she went down to 2 packs a day for about 2 months prior to  admission. She has been counseled to quit smoking cigarettes.  She said she is still trying to quit completely. Continue nicotine patch Chart review showed that she had low-dose CT chest for lung cancer screening on 02/06/2023 which showed "small solid pulmonary nodule of the right upper lobe measuring 4.2 mm, unchanged when compared with the prior".  Annual screening with low-dose CT chest in 12 months was recommended.   Her condition is improved and she is deemed stable for discharge to home today.  Discharge plan was discussed with the patient and her daughter at the bedside.      Consultants: None Procedures performed: None  Disposition: Home Diet recommendation:  Discharge Diet Orders (From admission, onward)     Start     Ordered   05/02/23 0000  Diet - low sodium heart healthy        05/02/23 1031   05/02/23 0000  Diet Carb Modified        05/02/23 1031           Cardiac and Carb modified diet DISCHARGE MEDICATION: Allergies as of 05/02/2023       Reactions   Cymbalta [duloxetine Hcl]    SI thoughts        Medication List     STOP taking these medications    dicyclomine 20 MG tablet Commonly known as: BENTYL   metroNIDAZOLE 500 MG tablet Commonly known as: FLAGYL   misoprostol 200 MCG tablet Commonly known as: CYTOTEC   ondansetron  4 MG tablet Commonly known as: ZOFRAN       TAKE these medications    Advair Diskus 250-50 MCG/ACT Aepb Generic drug: fluticasone-salmeterol Inhale 1 puff into the lungs in the morning and at bedtime.   albuterol 108 (90 Base) MCG/ACT inhaler Commonly known as: VENTOLIN HFA Inhale 2 puffs into the lungs every 6 (six) hours as needed for wheezing or shortness of breath.   blood glucose meter kit and supplies Kit Dispense based on patient and insurance preference. Use up to four times daily as directed. (FOR ICD-9 250.00, 250.01).   Blood Glucose Monitoring Suppl Devi 1 each by Does not apply route as directed.  May substitute to any manufacturer covered by patient's insurance.   BLOOD GLUCOSE TEST STRIPS Strp 1 each by In Vitro route in the morning, at noon, and at bedtime. May substitute to any manufacturer covered by patient's insurance.   chlorpheniramine-HYDROcodone 10-8 MG/5ML Commonly known as: TUSSIONEX Take 5 mLs by mouth every 12 (twelve) hours as needed for cough.   gabapentin 300 MG capsule Commonly known as: NEURONTIN Take 300 mg by mouth 3 (three) times daily.   Lancets Misc. Misc 1 each by Does not apply route in the morning, at noon, and at bedtime. May substitute to any manufacturer covered by patient's insurance.   metFORMIN 500 MG tablet Commonly known as: GLUCOPHAGE Take 2 tablets (1,000 mg total) by mouth 2 (two) times daily with a meal.   nicotine 14 mg/24hr patch Commonly known as: NICODERM CQ - dosed in mg/24 hours Place 1 patch (14 mg total) onto the skin daily.   nicotine polacrilex 4 MG lozenge Commonly known as: Nicotine Mini Take 1 lozenge (4 mg total) by mouth as needed.   Ozempic (0.25 or 0.5 MG/DOSE) 2 MG/3ML Sopn Generic drug: Semaglutide(0.25 or 0.5MG /DOS) Inject 0.25 mg into the skin once a week.   rosuvastatin 5 MG tablet Commonly known as: Crestor Take 1 tablet (5 mg total) by mouth daily.   tiZANidine 4 MG tablet Commonly known as: ZANAFLEX Take 4 mg by mouth every 6 (six) hours.        Discharge Exam: Filed Weights   04/27/23 1959  Weight: (!) 156.9 kg   GEN: NAD SKIN: Warm and dry EYES: EOMI ENT: MMM CV: RRR PULM: CTA B ABD: soft, obese, NT, +BS CNS: AAO x 3, non focal EXT: No edema or tenderness   Condition at discharge: good  The results of significant diagnostics from this hospitalization (including imaging, microbiology, ancillary and laboratory) are listed below for reference.   Imaging Studies: DG Chest Port 1 View  Result Date: 04/27/2023 CLINICAL DATA:  Cough and sputum. Shortness of breath over a few days.  Decreased oxygen saturation. Current smoker. EXAM: PORTABLE CHEST 1 VIEW COMPARISON:  12/15/2022 FINDINGS: Normal heart size. Perihilar infiltration with peribronchial thickening suggesting bronchitis or airways disease. No focal consolidation. No pleural effusions. No pneumothorax. Mediastinal contours appear intact. IMPRESSION: Perihilar infiltration and peribronchial thickening suggesting airways disease. Electronically Signed   By: Burman Nieves M.D.   On: 04/27/2023 20:58    Microbiology: Results for orders placed or performed during the hospital encounter of 04/27/23  Resp panel by RT-PCR (RSV, Flu A&B, Covid) Anterior Nasal Swab     Status: None   Collection Time: 04/27/23  8:20 PM   Specimen: Anterior Nasal Swab  Result Value Ref Range Status   SARS Coronavirus 2 by RT PCR NEGATIVE NEGATIVE Final    Comment: (NOTE) SARS-CoV-2 target nucleic  acids are NOT DETECTED.  The SARS-CoV-2 RNA is generally detectable in upper respiratory specimens during the acute phase of infection. The lowest concentration of SARS-CoV-2 viral copies this assay can detect is 138 copies/mL. A negative result does not preclude SARS-Cov-2 infection and should not be used as the sole basis for treatment or other patient management decisions. A negative result may occur with  improper specimen collection/handling, submission of specimen other than nasopharyngeal swab, presence of viral mutation(s) within the areas targeted by this assay, and inadequate number of viral copies(<138 copies/mL). A negative result must be combined with clinical observations, patient history, and epidemiological information. The expected result is Negative.  Fact Sheet for Patients:  BloggerCourse.com  Fact Sheet for Healthcare Providers:  SeriousBroker.it  This test is no t yet approved or cleared by the [place] FDA and  has been authorized for detection and/or diagnosis  of SARS-CoV-2 by FDA under an Emergency Use Authorization (EUA). This EUA will remain  in effect (meaning this test can be used) for the duration of the COVID-19 declaration under Section 564(b)(1) of the Act, 21 U.S.C.section 360bbb-3(b)(1), unless the authorization is terminated  or revoked sooner.       Influenza A by PCR NEGATIVE NEGATIVE Final   Influenza B by PCR NEGATIVE NEGATIVE Final    Comment: (NOTE) The Xpert Xpress SARS-CoV-2/FLU/RSV plus assay is intended as an aid in the diagnosis of influenza from Nasopharyngeal swab specimens and should not be used as a sole basis for treatment. Nasal washings and aspirates are unacceptable for Xpert Xpress SARS-CoV-2/FLU/RSV testing.  Fact Sheet for Patients: BloggerCourse.com  Fact Sheet for Healthcare Providers: SeriousBroker.it  This test is not yet approved or cleared by the [place] FDA and has been authorized for detection and/or diagnosis of SARS-CoV-2 by FDA under an Emergency Use Authorization (EUA). This EUA will remain in effect (meaning this test can be used) for the duration of the COVID-19 declaration under Section 564(b)(1) of the Act, 21 U.S.C. section 360bbb-3(b)(1), unless the authorization is terminated or revoked.     Resp Syncytial Virus by PCR NEGATIVE NEGATIVE Final    Comment: (NOTE) Fact Sheet for Patients: BloggerCourse.com  Fact Sheet for Healthcare Providers: SeriousBroker.it  This test is not yet approved or cleared by the [place] FDA and has been authorized for detection and/or diagnosis of SARS-CoV-2 by FDA under an Emergency Use Authorization (EUA). This EUA will remain in effect (meaning this test can be used) for the duration of the COVID-19 declaration under Section 564(b)(1) of the Act, 21 U.S.C. section 360bbb-3(b)(1), unless the authorization is terminated  or revoked.  Performed at [facility], [address] [place], [place] [address]   Blood culture (single)     Status: None   Collection Time: 04/27/23  8:23 PM   Specimen: BLOOD  Result Value Ref Range Status   Specimen Description BLOOD BLOOD LEFT ARM  Final   Special Requests   Final    BOTTLES DRAWN AEROBIC AND ANAEROBIC Blood Culture adequate volume   Culture   Final    NO GROWTH 5 DAYS Performed at [facility], [address], [place], [place] [address]    Report Status 05/02/2023 FINAL  Final  Blood Culture (routine x 2)     Status: None   Collection Time: 04/27/23  9:30 PM   Specimen: BLOOD RIGHT FOREARM  Result Value Ref Range Status   Specimen Description   Final    BLOOD RIGHT FOREARM Performed  at [facility], [address], [place], [place] [address]    Special Requests   Final    BOTTLES DRAWN AEROBIC AND ANAEROBIC Blood Culture adequate volume Performed at [place] [facility], [address] [place], [place] [address]    Culture  Setup Time   Final    GRAM POSITIVE RODS AEROBIC BOTTLE ONLY CRITICAL RESULT CALLED TO, READ BACK BY AND VERIFIED WITH: CARISSA [facility] AT 1953 ON 04/28/23 BY SS Performed at [facility], [address] [place], [place] [address]    Culture   Final    Corynebacterium minutissimum Standardized susceptibility testing for this organism is not available. Performed at [facility], [address], [place], [place] [address]    Report Status 05/01/2023 FINAL  Final  Respiratory (~20 pathogens) panel by PCR     Status: None   Collection Time: 04/28/23  3:45 AM   Specimen: Nasopharyngeal Swab; Respiratory  Result Value Ref Range Status   Adenovirus NOT DETECTED NOT DETECTED Final   Coronavirus 229E NOT DETECTED NOT DETECTED Final    Comment: (NOTE) The Coronavirus on the Respiratory Panel, DOES NOT test for the novel  Coronavirus (2019 nCoV)    Coronavirus HKU1 NOT DETECTED NOT  DETECTED Final   Coronavirus NL63 NOT DETECTED NOT DETECTED Final   Coronavirus OC43 NOT DETECTED NOT DETECTED Final   Metapneumovirus NOT DETECTED NOT DETECTED Final   Rhinovirus / Enterovirus NOT DETECTED NOT DETECTED Final   Influenza A NOT DETECTED NOT DETECTED Final   Influenza B NOT DETECTED NOT DETECTED Final   Parainfluenza Virus 1 NOT DETECTED NOT DETECTED Final   Parainfluenza Virus 2 NOT DETECTED NOT DETECTED Final   Parainfluenza Virus 3 NOT DETECTED NOT DETECTED Final   Parainfluenza Virus 4 NOT DETECTED NOT DETECTED Final   Respiratory Syncytial Virus NOT DETECTED NOT DETECTED Final   Bordetella pertussis NOT DETECTED NOT DETECTED Final   Bordetella Parapertussis NOT DETECTED NOT DETECTED Final   Chlamydophila pneumoniae NOT DETECTED NOT DETECTED Final   Mycoplasma pneumoniae NOT DETECTED NOT DETECTED Final    Comment: Performed at [facility], [address] [place], [place] [address]  Culture, blood (single) w Reflex to ID Panel     Status: None (Preliminary result)   Collection Time: 05/01/23 10:57 AM   Specimen: BLOOD  Result Value Ref Range Status   Specimen Description BLOOD BLOOD RIGHT ARM  Final   Special Requests   Final    BOTTLES DRAWN AEROBIC AND ANAEROBIC Blood Culture adequate volume   Culture   Final    NO GROWTH < 24 HOURS Performed at [facility], [address] [place], [place] [address]    Report Status PENDING  Incomplete    Labs: CBC: Recent Labs  Lab 04/27/23 2023 04/28/23 0603 04/29/23 0334 04/30/23 0245 05/02/23 0422  WBC 14.6* 13.1* 17.5* 15.1* 13.6*  NEUTROABS  --   --   --  11.2* 8.6*  HGB 14.0 12.7 12.4 11.7* 11.6*  HCT 43.8 39.2 38.8 37.9 35.6*  MCV 87.1 83.9 86.4 89.4 86.4  PLT 289 270 268 271 275   Basic Metabolic Panel: Recent Labs  Lab 04/27/23 2023 04/28/23 0603 04/29/23 0334 05/02/23 0422  NA 129* 134* 135 135  K 3.8 3.9 3.9 4.3  CL 96* 100 101 102  CO2 20* 23 26 27   GLUCOSE 368* 371* 245*  216*  BUN 12 13 23* 16  CREATININE 0.90 0.83 0.72 0.57  CALCIUM 8.3* 8.1* 7.9* 7.5*   Liver Function Tests: No results for input(s): "AST", "ALT", "ALKPHOS", "BILITOT", "PROT", "ALBUMIN" in the last 168 hours. CBG: Recent Labs  Lab 05/01/23 0753 05/01/23 1215 05/01/23 1602 05/01/23 2113 05/02/23 0730  GLUCAP 151* 252* 343* 233* 135*    Discharge time spent: greater than 30 minutes.  Signed: Lurene Shadow, MD [facility] 05/02/2023

## 2023-05-02 NOTE — TOC CM/SW Note (Signed)
Notified by Barbara Cower with [facility] that patient DC without HH orders. Asked MD to place orders.  Alfonso Ramus, LCSW Transitions of Care Department [phone]

## 2023-05-04 ENCOUNTER — Telehealth: Payer: Self-pay | Admitting: Nurse Practitioner

## 2023-05-04 ENCOUNTER — Telehealth: Payer: Self-pay

## 2023-05-04 NOTE — Telephone Encounter (Signed)
Ok by me

## 2023-05-04 NOTE — Transitions of Care (Post Inpatient/ED Visit) (Unsigned)
   05/04/2023  Name: Jasmine Larsen MRN: [id number] DOB: Jun 15, 1966  Today's TOC FU Call Status: Today's TOC FU Call Status:: Unsuccessul Call (1st Attempt) Unsuccessful Call (1st Attempt) Date: 05/04/23  Asked daughter to have pt call to make fu appt   Attempted to reach the patient regarding the most recent Inpatient/ED visit.  Follow Up Plan: Additional outreach attempts will be made to reach the patient to complete the Transitions of Care (Post Inpatient/ED visit) call.   Signature   Woodfin Ganja LPN [facility] Nurse Health Advisor Direct Dial [phone]

## 2023-05-04 NOTE — Telephone Encounter (Signed)
Pt daughter called stating the pt would like a TOC from [facility] to [employer]

## 2023-05-05 LAB — CULTURE, BLOOD (SINGLE): Special Requests: ADEQUATE

## 2023-05-05 NOTE — Transitions of Care (Post Inpatient/ED Visit) (Signed)
05/05/2023  Name: Jasmine Larsen MRN: [id number] DOB: 1966-09-15  Today's TOC FU Call Status: Today's TOC FU Call Status:: Successful TOC FU Call Competed Unsuccessful Call (1st Attempt) Date: 05/04/23 [facility] FU Call Complete Date: 05/05/23  Transition Care Management Follow-up Telephone Call Date of Discharge: 05/02/23 Discharge Facility: [facility]) Type of Discharge: Inpatient Admission Primary Inpatient Discharge Diagnosis:: Sepsis due to pneumonia How have you been since you were released from the hospital?: Better Any questions or concerns?: No  Items Reviewed: Did you receive and understand the discharge instructions provided?: Yes Medications obtained,verified, and reconciled?: Yes (Medications Reviewed) Any new allergies since your discharge?: No Dietary orders reviewed?: Yes Do you have support at home?: Yes  Medications Reviewed Today: Medications Reviewed Today     Reviewed by Merleen Nicely, LPN (Licensed Practical Nurse) on 05/05/23 at [phone]  Med List Status: <None>   Medication Order Taking? Sig Documenting Provider Last Dose Status Informant  albuterol (VENTOLIN HFA) 108 (90 Base) MCG/ACT inhaler [id number] Yes Inhale 2 puffs into the lungs every 6 (six) hours as needed for wheezing or shortness of breath. Eden Emms, NP Taking Active Self, Child  blood glucose meter kit and supplies KIT [id number] Yes Dispense based on patient and insurance preference. Use up to four times daily as directed. (FOR ICD-9 250.00, 250.01). Tresa Moore, MD Taking Active Self, Child  Blood Glucose Monitoring Suppl DEVI [id number] Yes 1 each by Does not apply route as directed. May substitute to any manufacturer covered by patient's insurance. Eden Emms, NP Taking Active Self, Child  chlorpheniramine-HYDROcodone (TUSSIONEX) 10-8 MG/5ML [id number] Yes Take 5 mLs by mouth every 12 (twelve) hours as needed for cough. Lurene Shadow, MD Taking Active    fluticasone-salmeterol (ADVAIR DISKUS) 250-50 MCG/ACT AEPB [id number] Yes Inhale 1 puff into the lungs in the morning and at bedtime. Eden Emms, NP Taking Active Self, Child  gabapentin (NEURONTIN) 300 MG capsule [id number] Yes Take 300 mg by mouth 3 (three) times daily. [provider] Taking Active Self, Child  Glucose Blood (BLOOD GLUCOSE TEST STRIPS) STRP [id number] Yes 1 each by In Vitro route in the morning, at noon, and at bedtime. May substitute to any manufacturer covered by patient's insurance. Eden Emms, NP Taking Active Self, Child  Lancets Misc. MISC [id number] Yes 1 each by Does not apply route in the morning, at noon, and at bedtime. May substitute to any manufacturer covered by patient's insurance. Eden Emms, NP Taking Active Self, Child  metFORMIN (GLUCOPHAGE) 500 MG tablet [id number] Yes Take 2 tablets (1,000 mg total) by mouth 2 (two) times daily with a meal. Eden Emms, NP Taking Active Self, Child  nicotine (NICODERM CQ - DOSED IN MG/24 HOURS) 14 mg/24hr patch [id number] Yes Place 1 patch (14 mg total) onto the skin daily. Pilar Jarvis, MD Taking Active   nicotine polacrilex (NICOTINE MINI) 4 MG lozenge [id number] Yes Take 1 lozenge (4 mg total) by mouth as needed. Pilar Jarvis, MD Taking Active   rosuvastatin (CRESTOR) 5 MG tablet [id number] Yes Take 1 tablet (5 mg total) by mouth daily. Eden Emms, NP Taking Active Self, Child  Semaglutide,0.25 or 0.5MG /DOS, (OZEMPIC, 0.25 OR 0.5 MG/DOSE,) 2 MG/3ML SOPN [id number] No Inject 0.25 mg into the skin once a week.  Patient not taking: Reported on 05/05/2023   Eden Emms, NP Not Taking Active Self, Child           Med Note [place], [facility]  F   Mon Apr 27, 2023 10:02 PM) Has not started,   tiZANidine (ZANAFLEX) 4 MG tablet [id number] Yes Take 4 mg by mouth every 6 (six) hours. [provider] Taking Active Self, Child            Home Care and Equipment/Supplies: Were Home Health Services Ordered?:  No Any new equipment or medical supplies ordered?: No  Functional Questionnaire: Do you need assistance with bathing/showering or dressing?: No Do you need assistance with meal preparation?: No Do you need assistance with eating?: No Do you have difficulty maintaining continence: No Do you need assistance with getting out of bed/getting out of a chair/moving?: No Do you have difficulty managing or taking your medications?: No  Follow up appointments reviewed: PCP Follow-up appointment confirmed?: No MD Provider Line [phone] Given: Yes Specialist Hospital Follow-up appointment confirmed?: Yes Date of Specialist follow-up appointment?: 07/17/23 Follow-Up Specialty Provider:: cardiologist Do you need transportation to your follow-up appointment?: No Do you understand care options if your condition(s) worsen?: Yes-patient verbalized understanding    SIGNATURE  Woodfin Ganja LPN [facility] Nurse Health Advisor Direct Dial [phone]

## 2023-05-06 LAB — CULTURE, BLOOD (SINGLE)

## 2023-05-06 NOTE — Telephone Encounter (Signed)
Pt daughter stated it is the patient request

## 2023-05-08 ENCOUNTER — Other Ambulatory Visit: Payer: Self-pay | Admitting: Nurse Practitioner

## 2023-05-08 DIAGNOSIS — E1165 Type 2 diabetes mellitus with hyperglycemia: Secondary | ICD-10-CM

## 2023-05-08 MED ORDER — OZEMPIC (0.25 OR 0.5 MG/DOSE) 2 MG/3ML ~~LOC~~ SOPN
0.5000 mg | PEN_INJECTOR | SUBCUTANEOUS | 1 refills | Status: DC
Start: 2023-05-08 — End: 2023-08-13

## 2023-05-08 MED ORDER — BLOOD GLUCOSE MONITORING SUPPL DEVI
1.0000 | 0 refills | Status: AC
Start: 2023-05-08 — End: ?

## 2023-05-08 MED ORDER — METFORMIN HCL 500 MG PO TABS
1000.0000 mg | ORAL_TABLET | Freq: Two times a day (BID) | ORAL | 0 refills | Status: AC
Start: 2023-05-08 — End: ?

## 2023-05-13 NOTE — Telephone Encounter (Signed)
FYI - MRI arm still not done.   Pt was seen in Hospital recently as well.

## 2023-05-28 ENCOUNTER — Encounter (INDEPENDENT_AMBULATORY_CARE_PROVIDER_SITE_OTHER): Payer: Self-pay

## 2023-06-04 ENCOUNTER — Ambulatory Visit
Admission: RE | Admit: 2023-06-04 | Discharge: 2023-06-04 | Disposition: A | Discharge status: Home / Self Care | Payer: Medicare Other | Source: Ambulatory Visit | Attending: Nurse Practitioner | Admitting: Nurse Practitioner

## 2023-06-04 DIAGNOSIS — R2231 Localized swelling, mass and lump, right upper limb: Secondary | ICD-10-CM | POA: Insufficient documentation

## 2023-06-04 MED ORDER — GADOBUTROL 1 MMOL/ML IV SOLN
10.0000 mL | Freq: Once | INTRAVENOUS | Status: AC | PRN
  Administered 2023-06-04: 10 mL via INTRAVENOUS

## 2023-06-19 ENCOUNTER — Other Ambulatory Visit: Payer: Self-pay

## 2023-06-19 ENCOUNTER — Emergency Department
Admission: EM | Admit: 2023-06-19 | Discharge: 2023-06-19 | Disposition: A | Discharge status: Home / Self Care | Payer: Medicare Other | Attending: Emergency Medicine | Admitting: Emergency Medicine

## 2023-06-19 ENCOUNTER — Encounter: Payer: Self-pay | Admitting: Emergency Medicine

## 2023-06-19 ENCOUNTER — Emergency Department: Payer: Medicare Other

## 2023-06-19 DIAGNOSIS — I509 Heart failure, unspecified: Secondary | ICD-10-CM | POA: Diagnosis not present

## 2023-06-19 DIAGNOSIS — J45909 Unspecified asthma, uncomplicated: Secondary | ICD-10-CM | POA: Diagnosis not present

## 2023-06-19 DIAGNOSIS — M79671 Pain in right foot: Secondary | ICD-10-CM | POA: Diagnosis present

## 2023-06-19 DIAGNOSIS — J449 Chronic obstructive pulmonary disease, unspecified: Secondary | ICD-10-CM | POA: Diagnosis not present

## 2023-06-19 DIAGNOSIS — E119 Type 2 diabetes mellitus without complications: Secondary | ICD-10-CM | POA: Insufficient documentation

## 2023-06-19 MED ORDER — GABAPENTIN 300 MG PO CAPS: 300.0000 mg | ORAL_CAPSULE | Freq: Three times a day (TID) | ORAL | 2 refills | Status: DC

## 2023-06-19 NOTE — ED Triage Notes (Signed)
Pt here with right foot pain. Pt states he foot is throbbing. Pt states she is not able to put weight on that foot.

## 2023-06-19 NOTE — ED Provider Notes (Signed)
[facility] Provider Note   Event Date/Time   First MD Initiated Contact with Patient 06/19/23 1105     (approximate) History  Foot Pain  HPI Jasmine Larsen is a 57 y.o. female With a past medical history of asthma, COPD, CHF, type 2 diabetes, and morbid obesity presents complaining of right lateral plantar foot pain that radiates from the right lateral ankle.  Patient states that this pain has been present over the last few days despite using over-the-counter analgesics.  Patient states that she has had back pain that is shooting and similar to this in the past that she has used gabapentin for however she has not been prescribed her gabapentin since stay in the hospital few months ago.  Patient denies any trauma.  Patient denies any color change to this foot.  Patient endorses 8/10, shooting pain from the right lateral ankle down to the lateral plantar aspect of the foot with extreme tenderness to palpation or light touch ROS: Patient currently denies any vision changes, tinnitus, difficulty speaking, facial droop, sore throat, chest pain, shortness of breath, abdominal pain, nausea/vomiting/diarrhea, dysuria, or weakness/numbness in any extremity   Physical Exam  Triage Vital Signs: ED Triage Vitals  Encounter Vitals Group     BP 06/19/23 1041 (!) 148/84     Systolic BP Percentile --      Diastolic BP Percentile --      Pulse Rate 06/19/23 1041 77     Resp 06/19/23 1041 18     Temp 06/19/23 1041 97.8 F (36.6 C)     Temp Source 06/19/23 1041 Oral     SpO2 06/19/23 1041 95 %     Weight 06/19/23 1044 (!) 345 lb 14.4 oz (156.9 kg)     Height 06/19/23 1044 5\' 4"  (1.626 m)     Head Circumference --      Peak Flow --      Pain Score 06/19/23 1042 8     Pain Loc --      Pain Education --      Exclude from Growth Chart --    Most recent vital signs: Vitals:   06/19/23 1041  BP: (!) 148/84  Pulse: 77  Resp: 18  Temp: 97.8 F (36.6 C)  SpO2: 95%    General: Awake, oriented x4. CV:  Good peripheral perfusion.  Resp:  Normal effort.  Abd:  No distention.  Other:  Morbidly obese middle-aged Caucasian female resting comfortably in no acute distress.  There is tenderness to palpation and light touch at the lateral plantar aspect of the right foot that tracks up to just posterior to the lateral malleolus on the right. ED Results / Procedures / Treatments   RADIOLOGY ED MD interpretation: X-ray of the right foot interpreted independently by me and shows no evidence of acute abnormalities with incidentally found calcaneal spurring -Agree with radiology assessment Official radiology report(s): DG Foot Complete Right  Result Date: 06/19/2023 CLINICAL DATA:  foot pain EXAM: RIGHT FOOT COMPLETE - 3+ VIEW COMPARISON:  None Available. FINDINGS: No acute fracture or dislocation. No aggressive osseous lesion. Mild diffuse degenerative changes of imaged joints noted. Calcaneal spur noted along the Achilles tendon and Plantar aponeurosis attachment sites. No focal soft tissue swelling. No radiopaque foreign bodies. IMPRESSION: 1. No acute osseous abnormalities.  Calcaneal spurring. Electronically Signed   By: Jules Schick M.D.   On: 06/19/2023 11:56   PROCEDURES: Critical Care performed: No Procedures MEDICATIONS ORDERED IN ED: Medications -  No data to display IMPRESSION / MDM / ASSESSMENT AND PLAN / ED COURSE  I reviewed the triage vital signs and the nursing notes.                             The patient is on the cardiac monitor to evaluate for evidence of arrhythmia and/or significant heart rate changes. Patient's presentation is most consistent with acute presentation with potential threat to life or bodily function. 57 year old female presents complaining of right lateral ankle and right plantar aspect of the right foot pain Given history, exam and workup I have low suspicion for fracture, dislocation, significant ligamentous injury, septic  arthritis, gout flare, new autoimmune arthropathy, or gonococcal arthropathy.  Interventions: X-ray of the right foot does not show any evidence of acute abnormalities.  There is incidentally found calcaneal spurring.  Given calcaneal spurring and the "shooting" aspect, I am concerned for possible nerve related pain.  Given that this pain starts at the right lateral ankle and shoots to the lateral plantar surface, concern for possible sural nerve compression Will trial prescription for gabapentin and follow-up with orthopedic surgery for further management of her pain. Disposition: Discharge home with strict return precautions and instructions for prompt primary care follow up in the next week.   FINAL CLINICAL IMPRESSION(S) / ED DIAGNOSES   Final diagnoses:  Right foot pain   Rx / DC Orders   ED Discharge Orders          Ordered    gabapentin (NEURONTIN) 300 MG capsule  3 times daily        06/19/23 1217           Note:  This document was prepared using Dragon voice recognition software and may include unintentional dictation errors.   Merwyn Katos, MD 06/19/23 [phone]

## 2023-06-22 NOTE — Group Note (Deleted)

## 2023-06-23 ENCOUNTER — Telehealth: Payer: Self-pay

## 2023-06-23 DIAGNOSIS — E1165 Type 2 diabetes mellitus with hyperglycemia: Secondary | ICD-10-CM

## 2023-06-23 NOTE — Transitions of Care (Post Inpatient/ED Visit) (Signed)
I spoke with Alcario Drought pts daughter (DPR signed) pt seen [facility] ED 06/19/23 rt foot pain; [facility] ortho for FU nerve pushing on foot. and pt is taking gabapentin 300 mg tid and that seems to be helping foot pain.  Pt is able to bear weight on rt foot now. Pt already has appt to see Audria Nine NP on 08/06/23 at 2pm and Alcario Drought does not think pt needs sooner appt. Alcario Drought will call [facility] if pt does need sooner appt; Alcario Drought is still waiting on appt with [facility] ortho. UC & ED precautions given and erica voiced understanding.sending note to Audria Nine NP.    06/23/2023  Name: Jasmine Larsen MRN: [id number] DOB: 01/05/1966  Today's TOC FU Call Status: Today's TOC FU Call Status:: Successful TOC FU Call Completed TOC FU Call Complete Date: 06/23/23 Patient's Name and Date of Birth confirmed.  Transition Care Management Follow-up Telephone Call Date of Discharge: 06/19/23 Discharge Facility: [facility]) Type of Discharge: Emergency Department Reason for ED Visit: Other: (pt seen [facility] ED 06/19/23 rt foot pain; [facility] ortho for FU nerve pushing on foot. and pt is taking gabapentin 300 mg tid and that seems to be helping foot pain.) How have you been since you were released from the hospital?: Better Any questions or concerns?: No  Items Reviewed: Did you receive and understand the discharge instructions provided?: Yes Medications obtained,verified, and reconciled?: Yes (Medications Reviewed) (semaglutide 0.25 mg into skin once a week per Alcario Drought pts daughter) Any new allergies since your discharge?: No Dietary orders reviewed?: NA Do you have support at home?: Yes People in Home: child(ren), adult Name of Support/Comfort Primary Source: Alcario Drought  Medications Reviewed Today: Medications Reviewed Today     Reviewed by Patience Musca, LPN (Licensed Practical Nurse) on 06/23/23 at 1422  Med List Status: <None>   Medication Order Taking? Sig Documenting Provider  Last Dose Status Informant  albuterol (VENTOLIN HFA) 108 (90 Base) MCG/ACT inhaler [id number] No Inhale 2 puffs into the lungs every 6 (six) hours as needed for wheezing or shortness of breath. Eden Emms, NP Taking Active Self, Child  blood glucose meter kit and supplies KIT [id number] No Dispense based on patient and insurance preference. Use up to four times daily as directed. (FOR ICD-9 250.00, 250.01). Tresa Moore, MD Taking Active Self, Child  Blood Glucose Monitoring Suppl DEVI [id number]  1 each by Does not apply route as directed. May substitute to any manufacturer covered by patient's insurance. Eden Emms, NP  Active   chlorpheniramine-HYDROcodone (TUSSIONEX) 10-8 MG/5ML [id number] No Take 5 mLs by mouth every 12 (twelve) hours as needed for cough. Lurene Shadow, MD Taking Active   fluticasone-salmeterol (ADVAIR DISKUS) 250-50 MCG/ACT AEPB [id number] No Inhale 1 puff into the lungs in the morning and at bedtime. Eden Emms, NP Taking Active Self, Child  gabapentin (NEURONTIN) 300 MG capsule [id number]  Take 1 capsule (300 mg total) by mouth 3 (three) times daily. Merwyn Katos, MD  Active   Glucose Blood (BLOOD GLUCOSE TEST STRIPS) STRP [id number] No 1 each by In Vitro route in the morning, at noon, and at bedtime. May substitute to any manufacturer covered by patient's insurance. Eden Emms, NP Taking Active Self, Child  Lancets Misc. MISC [id number] No 1 each by Does not apply route in the morning, at noon, and at bedtime. May substitute to any manufacturer covered by patient's insurance. Eden Emms, NP Taking Active Self, Child  metFORMIN (GLUCOPHAGE) 500 MG tablet [id number]  Take 2 tablets (1,000 mg total) by mouth 2 (two) times daily with a meal. Eden Emms, NP  Active   nicotine (NICODERM CQ - DOSED IN MG/24 HOURS) 14 mg/24hr patch [id number] No Place 1 patch (14 mg total) onto the skin daily. Pilar Jarvis, MD Taking Active   nicotine polacrilex (NICOTINE MINI) 4  MG lozenge [id number] No Take 1 lozenge (4 mg total) by mouth as needed. Pilar Jarvis, MD Taking Active   rosuvastatin (CRESTOR) 5 MG tablet [id number] No Take 1 tablet (5 mg total) by mouth daily. Eden Emms, NP Taking Active Self, Child  Semaglutide,0.25 or 0.5MG /DOS, (OZEMPIC, 0.25 OR 0.5 MG/DOSE,) 2 MG/3ML SOPN [id number]  Inject 0.5 mg into the skin once a week. Eden Emms, NP  Active   tiZANidine (ZANAFLEX) 4 MG tablet [id number] No Take 4 mg by mouth every 6 (six) hours. [provider] Taking Active Self, Child            Home Care and Equipment/Supplies: Were Home Health Services Ordered?: NA Any new equipment or medical supplies ordered?: NA  Functional Questionnaire: Do you need assistance with bathing/showering or dressing?: No Do you need assistance with meal preparation?: No Do you need assistance with eating?: No Do you have difficulty maintaining continence: No Do you need assistance with getting out of bed/getting out of a chair/moving?: No Do you have difficulty managing or taking your medications?: No  Follow up appointments reviewed: PCP Follow-up appointment confirmed?: Yes Date of PCP follow-up appointment?: 07/01/23 Follow-up Provider: Audria Nine NP Specialist Hospital Follow-up appointment confirmed?: No Alcario Drought pts daughter waiting to hear about [facility] ortho appt. erica to contact docotrs office if doesnot hear about ortho appt.) Follow-Up Specialty Provider:: [facility] Reason Specialist Follow-Up Not Confirmed: Patient has Specialist Provider Number and will Call for Appointment Do you need transportation to your follow-up appointment?: No Do you understand care options if your condition(s) worsen?: Yes-patient verbalized understanding    SIGNATURE Lewanda Rife, LPN

## 2023-06-23 NOTE — Telephone Encounter (Signed)
Noted  

## 2023-06-28 ENCOUNTER — Emergency Department
Admission: EM | Admit: 2023-06-28 | Discharge: 2023-06-28 | Disposition: A | Discharge status: Home / Self Care | Payer: Medicare Other | Attending: Emergency Medicine | Admitting: Emergency Medicine

## 2023-06-28 ENCOUNTER — Other Ambulatory Visit: Payer: Self-pay

## 2023-06-28 ENCOUNTER — Encounter: Payer: Self-pay | Admitting: Emergency Medicine

## 2023-06-28 DIAGNOSIS — J45909 Unspecified asthma, uncomplicated: Secondary | ICD-10-CM | POA: Diagnosis not present

## 2023-06-28 DIAGNOSIS — W130XXA Fall from, out of or through balcony, initial encounter: Secondary | ICD-10-CM | POA: Diagnosis not present

## 2023-06-28 DIAGNOSIS — W19XXXA Unspecified fall, initial encounter: Secondary | ICD-10-CM

## 2023-06-28 DIAGNOSIS — I509 Heart failure, unspecified: Secondary | ICD-10-CM | POA: Insufficient documentation

## 2023-06-28 DIAGNOSIS — S61512A Laceration without foreign body of left wrist, initial encounter: Secondary | ICD-10-CM | POA: Insufficient documentation

## 2023-06-28 DIAGNOSIS — S41012A Laceration without foreign body of left shoulder, initial encounter: Secondary | ICD-10-CM | POA: Insufficient documentation

## 2023-06-28 DIAGNOSIS — J449 Chronic obstructive pulmonary disease, unspecified: Secondary | ICD-10-CM | POA: Insufficient documentation

## 2023-06-28 MED ORDER — LIDOCAINE-EPINEPHRINE (PF) 2 %-1:200000 IJ SOLN
20.0000 mL | Freq: Once | INTRAMUSCULAR | Status: AC
  Administered 2023-06-28: 20 mL via INTRADERMAL
  Filled 2023-06-28: qty 20

## 2023-06-28 MED ORDER — CEPHALEXIN 500 MG PO CAPS: 500.0000 mg | ORAL_CAPSULE | Freq: Four times a day (QID) | ORAL | 0 refills | Status: AC

## 2023-06-28 MED ORDER — HYDROCODONE-ACETAMINOPHEN 5-325 MG PO TABS
1.0000 | ORAL_TABLET | Freq: Once | ORAL | Status: AC
  Administered 2023-06-28: 1 via ORAL
  Filled 2023-06-28: qty 1

## 2023-06-28 MED ORDER — KETOROLAC TROMETHAMINE 15 MG/ML IJ SOLN
15.0000 mg | Freq: Once | INTRAMUSCULAR | Status: AC
  Administered 2023-06-28: 15 mg via INTRAMUSCULAR
  Filled 2023-06-28: qty 1

## 2023-06-28 NOTE — Discharge Instructions (Signed)
Your stitches will need to be removed in 7 to 10 days.  This can be done by your primary care provider, urgent care or by this ED.  Please keep the stitches clean and dry.  You can wash with soap and water daily and change the bandage.  Please keep covered until stitches are removed.  Watch for signs of infection including redness, warmth, swelling, pain and purulent discharge.  If you develop any of these signs please return to the ED.  Please take the antibiotic as prescribed. You can take 650 mg of Tylenol and 600 mg of ibuprofen every 6 hours as needed for pain.

## 2023-06-28 NOTE — ED Triage Notes (Signed)
Pt via POV from home. Pt c/o fall off the porch. Denies head injury. Pt c/o laceration on the L upper arm, posteriorly and L wrist. Denies blood thinners. Pt is A&OX4 and NAD

## 2023-06-28 NOTE — ED Provider Notes (Signed)
[facility] Provider Note    Event Date/Time   First MD Initiated Contact with Patient 06/28/23 1648     (approximate)   History   Fall   HPI  Jasmine Larsen is a 57 y.o. female PMH of asthma, CHF, COPD, diabetes and obesity presents for evaluation of multiple lacerations after a fall.  Patient states she fell off her porch landing on a bush and a dog crate.  She has multiple lacerations to the left wrist and left armpit. She did not hit her head, no LOC. patient's last tetanus shot was within the last 5 years.     Physical Exam   Triage Vital Signs: ED Triage Vitals  Encounter Vitals Group     BP 06/28/23 1524 137/66     Systolic BP Percentile --      Diastolic BP Percentile --      Pulse Rate 06/28/23 1524 81     Resp 06/28/23 1524 18     Temp 06/28/23 1524 97.7 F (36.5 C)     Temp src --      SpO2 06/28/23 1524 98 %     Weight 06/28/23 1524 (!) 346 lb (156.9 kg)     Height 06/28/23 1524 5\' 2"  (1.575 m)     Head Circumference --      Peak Flow --      Pain Score 06/28/23 1524 8     Pain Loc --      Pain Education --      Exclude from Growth Chart --     Most recent vital signs: Vitals:   06/28/23 1524 06/28/23 1842  BP: 137/66 138/69  Pulse: 81 85  Resp: 18 18  Temp: 97.7 F (36.5 C)   SpO2: 98% 98%    General: Awake, no distress.  CV:  Good peripheral perfusion.  Resp:  Normal effort.  Abd:  No distention.  Other:  4 1 cm lacerations to the left wrist, several lacerations to the left armpit and back of shoulder, bleeding is controlled, shoulder ROM intact, radial pulse 2+ and regular, sensation intact across all dermatomes.   ED Results / Procedures / Treatments   Labs (all labs ordered are listed, but only abnormal results are displayed) Labs Reviewed - No data to display   PROCEDURES:  Critical Care performed: No  ..Laceration Repair  Date/Time: 06/28/2023 6:58 PM  Performed by: Cameron Ali,  PA-C Authorized by: Cameron Ali, PA-C   Consent:    Consent obtained:  Verbal   Consent given by:  Patient   Risks, benefits, and alternatives were discussed: yes     Risks discussed:  Infection, pain, poor cosmetic result and poor wound healing   Alternatives discussed:  No treatment Universal protocol:    Patient identity confirmed:  Verbally with patient Anesthesia:    Anesthesia method:  Local infiltration   Local anesthetic:  Lidocaine 2% WITH epi Laceration details:    Location:  Shoulder/arm   Shoulder/arm location:  L lower arm   Length (cm):  1   Depth (mm):  3 Pre-procedure details:    Preparation:  Patient was prepped and draped in usual sterile fashion Exploration:    Limited defect created (wound extended): no     Hemostasis achieved with:  Direct pressure Treatment:    Area cleansed with:  Povidone-iodine   Amount of cleaning:  Standard   Irrigation solution:  Sterile water   Irrigation volume:  10 mL  Irrigation method:  Syringe   Visualized foreign bodies/material removed: no   Skin repair:    Repair method:  Sutures   Suture size:  4-0   Suture material:  Nylon   Suture technique:  Simple interrupted   Number of sutures:  1 Approximation:    Approximation:  Close Repair type:    Repair type:  Simple Post-procedure details:    Dressing:  Adhesive bandage   Procedure completion:  Tolerated well, no immediate complications .Marland KitchenLaceration Repair  Date/Time: 06/28/2023 6:59 PM  Performed by: Cameron Ali, PA-C Authorized by: Cameron Ali, PA-C   Consent:    Consent obtained:  Verbal   Consent given by:  Patient   Risks, benefits, and alternatives were discussed: yes     Risks discussed:  Infection, pain, poor cosmetic result and poor wound healing   Alternatives discussed:  No treatment Anesthesia:    Anesthesia method:  Local infiltration   Local anesthetic:  Lidocaine 2% WITH epi Laceration details:    Location:   Shoulder/arm   Shoulder/arm location:  L lower arm   Length (cm):  1   Depth (mm):  3 Pre-procedure details:    Preparation:  Patient was prepped and draped in usual sterile fashion Exploration:    Hemostasis achieved with:  Direct pressure Treatment:    Area cleansed with:  Povidone-iodine   Amount of cleaning:  Standard   Irrigation solution:  Sterile saline   Irrigation volume:  10 mL   Irrigation method:  Syringe Skin repair:    Repair method:  Sutures   Suture size:  4-0   Suture material:  Nylon   Suture technique:  Simple interrupted   Number of sutures:  1 Approximation:    Approximation:  Close Repair type:    Repair type:  Simple Post-procedure details:    Dressing:  Adhesive bandage   Procedure completion:  Tolerated well, no immediate complications .Marland KitchenLaceration Repair  Date/Time: 06/28/2023 7:08 PM  Performed by: Cameron Ali, PA-C Authorized by: Cameron Ali, PA-C   Consent:    Consent obtained:  Verbal   Consent given by:  Patient   Risks, benefits, and alternatives were discussed: yes     Risks discussed:  Infection, pain, poor cosmetic result and poor wound healing   Alternatives discussed:  No treatment Universal protocol:    Patient identity confirmed:  Verbally with patient Anesthesia:    Anesthesia method:  Local infiltration   Local anesthetic:  Lidocaine 2% WITH epi Laceration details:    Location:  Shoulder/arm   Shoulder/arm location:  L lower arm   Length (cm):  1   Depth (mm):  3 Pre-procedure details:    Preparation:  Patient was prepped and draped in usual sterile fashion Exploration:    Hemostasis achieved with:  Direct pressure Treatment:    Area cleansed with:  Povidone-iodine   Amount of cleaning:  Standard   Irrigation solution:  Sterile saline   Irrigation volume:  10 mL   Irrigation method:  Syringe Skin repair:    Repair method:  Sutures   Suture size:  4-0   Suture material:  Nylon   Suture technique:   Simple interrupted   Number of sutures:  1 Approximation:    Approximation:  Close Repair type:    Repair type:  Simple Post-procedure details:    Dressing:  Bulky dressing   Procedure completion:  Tolerated well, no immediate complications .Marland KitchenLaceration Repair  Date/Time: 06/28/2023 9:32 PM  Performed  by: Cameron Ali, PA-C Authorized by: Cameron Ali, PA-C   Consent:    Consent obtained:  Verbal   Consent given by:  Patient   Risks, benefits, and alternatives were discussed: yes     Risks discussed:  Infection, pain, poor cosmetic result and poor wound healing   Alternatives discussed:  No treatment Universal protocol:    Patient identity confirmed:  Verbally with patient Anesthesia:    Anesthesia method:  Local infiltration   Local anesthetic:  Lidocaine 2% WITH epi Laceration details:    Location:  Shoulder/arm   Shoulder/arm location:  L lower arm   Length (cm):  1   Depth (mm):  3 Pre-procedure details:    Preparation:  Patient was prepped and draped in usual sterile fashion Exploration:    Hemostasis achieved with:  Direct pressure   Wound exploration: entire depth of wound visualized   Treatment:    Area cleansed with:  Povidone-iodine   Amount of cleaning:  Standard   Irrigation solution:  Sterile saline and sterile water   Irrigation volume:  10 mL   Irrigation method:  Syringe Skin repair:    Repair method:  Sutures   Suture size:  4-0   Suture material:  Nylon   Suture technique:  Simple interrupted   Number of sutures:  1 Approximation:    Approximation:  Close Repair type:    Repair type:  Simple Post-procedure details:    Dressing:  Adhesive bandage   Procedure completion:  Tolerated well, no immediate complications .Marland KitchenLaceration Repair  Date/Time: 06/28/2023 9:33 PM  Performed by: Cameron Ali, PA-C Authorized by: Cameron Ali, PA-C   Consent:    Consent obtained:  Verbal   Consent given by:  Patient   Risks,  benefits, and alternatives were discussed: yes     Risks discussed:  Infection, pain and poor cosmetic result   Alternatives discussed:  No treatment Universal protocol:    Patient identity confirmed:  Verbally with patient Anesthesia:    Anesthesia method:  Local infiltration   Local anesthetic:  Lidocaine 2% WITH epi Laceration details:    Location:  Shoulder/arm   Shoulder/arm location:  L shoulder   Length (cm):  1   Depth (mm):  3 Pre-procedure details:    Preparation:  Patient was prepped and draped in usual sterile fashion Exploration:    Hemostasis achieved with:  Direct pressure   Wound exploration: entire depth of wound visualized   Treatment:    Area cleansed with:  Povidone-iodine   Amount of cleaning:  Standard   Irrigation solution:  Sterile saline   Irrigation volume:  10 mL   Irrigation method:  Syringe Skin repair:    Repair method:  Sutures   Suture size:  4-0   Suture material:  Nylon   Suture technique:  Simple interrupted   Number of sutures:  1 Approximation:    Approximation:  Close Repair type:    Repair type:  Simple Post-procedure details:    Dressing:  Bulky dressing   Procedure completion:  Tolerated well, no immediate complications .Marland KitchenLaceration Repair  Date/Time: 06/28/2023 9:34 PM  Performed by: Cameron Ali, PA-C Authorized by: Cameron Ali, PA-C   Consent:    Consent obtained:  Verbal   Consent given by:  Patient   Risks, benefits, and alternatives were discussed: yes     Risks discussed:  Infection, pain, poor cosmetic result and poor wound healing   Alternatives discussed:  No treatment  Universal protocol:    Patient identity confirmed:  Verbally with patient Anesthesia:    Anesthesia method:  Local infiltration   Local anesthetic:  Lidocaine 2% WITH epi Laceration details:    Location:  Shoulder/arm   Shoulder/arm location:  L shoulder   Length (cm):  5   Depth (mm):  5 Pre-procedure details:    Preparation:   Patient was prepped and draped in usual sterile fashion Exploration:    Hemostasis achieved with:  Direct pressure   Wound exploration: entire depth of wound visualized   Treatment:    Area cleansed with:  Povidone-iodine   Amount of cleaning:  Standard   Irrigation solution:  Sterile saline   Irrigation volume:  20 mL   Irrigation method:  Syringe Skin repair:    Repair method:  Sutures   Suture size:  4-0   Suture material:  Nylon   Suture technique:  Simple interrupted   Number of sutures:  6 Approximation:    Approximation:  Close Repair type:    Repair type:  Simple Post-procedure details:    Dressing:  Bulky dressing   Procedure completion:  Tolerated well, no immediate complications .Marland KitchenLaceration Repair  Date/Time: 06/28/2023 9:35 PM  Performed by: Cameron Ali, PA-C Authorized by: Cameron Ali, PA-C   Consent:    Consent obtained:  Verbal   Consent given by:  Patient   Risks, benefits, and alternatives were discussed: yes     Risks discussed:  Infection, pain, poor cosmetic result and poor wound healing   Alternatives discussed:  No treatment Universal protocol:    Patient identity confirmed:  Verbally with patient Anesthesia:    Anesthesia method:  Local infiltration   Local anesthetic:  Lidocaine 2% WITH epi Laceration details:    Location:  Shoulder/arm   Shoulder/arm location:  L shoulder   Length (cm):  1   Depth (mm):  3 Pre-procedure details:    Preparation:  Patient was prepped and draped in usual sterile fashion Exploration:    Hemostasis achieved with:  Direct pressure   Wound exploration: entire depth of wound visualized   Treatment:    Area cleansed with:  Povidone-iodine   Amount of cleaning:  Standard   Irrigation solution:  Sterile saline   Irrigation volume:  10 mL   Irrigation method:  Syringe Skin repair:    Repair method:  Sutures   Suture size:  4-0   Suture material:  Nylon   Suture technique:  Simple interrupted    Number of sutures:  1 Approximation:    Approximation:  Close Repair type:    Repair type:  Simple Post-procedure details:    Dressing:  Bulky dressing   Procedure completion:  Tolerated well, no immediate complications .Marland KitchenLaceration Repair  Date/Time: 06/28/2023 9:36 PM  Performed by: Cameron Ali, PA-C Authorized by: Cameron Ali, PA-C   Consent:    Consent obtained:  Verbal   Consent given by:  Patient   Risks, benefits, and alternatives were discussed: yes     Risks discussed:  Infection, pain, poor cosmetic result and poor wound healing   Alternatives discussed:  No treatment Universal protocol:    Patient identity confirmed:  Verbally with patient Anesthesia:    Anesthesia method:  Local infiltration   Local anesthetic:  Lidocaine 2% WITH epi Laceration details:    Location:  Shoulder/arm   Shoulder/arm location:  L shoulder   Length (cm):  2   Depth (mm):  5 Pre-procedure details:  Preparation:  Patient was prepped and draped in usual sterile fashion Exploration:    Hemostasis achieved with:  Direct pressure   Wound exploration: entire depth of wound visualized   Treatment:    Area cleansed with:  Povidone-iodine   Amount of cleaning:  Standard   Irrigation solution:  Sterile saline   Irrigation volume:  20 mL   Irrigation method:  Syringe Skin repair:    Repair method:  Sutures   Suture size:  4-0   Suture material:  Nylon   Suture technique:  Simple interrupted   Number of sutures:  2 Approximation:    Approximation:  Close Repair type:    Repair type:  Simple Post-procedure details:    Dressing:  Bulky dressing   Procedure completion:  Tolerated well, no immediate complications .Marland KitchenLaceration Repair  Date/Time: 06/28/2023 9:37 PM  Performed by: Cameron Ali, PA-C Authorized by: Cameron Ali, PA-C   Consent:    Consent obtained:  Verbal   Consent given by:  Patient   Risks, benefits, and alternatives were discussed: yes      Risks discussed:  Infection, pain, poor cosmetic result and poor wound healing   Alternatives discussed:  No treatment Universal protocol:    Patient identity confirmed:  Verbally with patient Anesthesia:    Anesthesia method:  Local infiltration   Local anesthetic:  Lidocaine 2% WITH epi Laceration details:    Location:  Shoulder/arm   Shoulder/arm location:  L shoulder   Length (cm):  4   Depth (mm):  5 Exploration:    Hemostasis achieved with:  Direct pressure   Wound exploration: entire depth of wound visualized   Treatment:    Area cleansed with:  Povidone-iodine   Amount of cleaning:  Standard   Irrigation solution:  Sterile saline   Irrigation volume:  30 mL   Irrigation method:  Syringe Skin repair:    Repair method:  Sutures   Suture size:  4-0   Suture material:  Nylon   Suture technique:  Simple interrupted   Number of sutures:  3 Approximation:    Approximation:  Close Repair type:    Repair type:  Simple Post-procedure details:    Dressing:  Bulky dressing   Procedure completion:  Tolerated well, no immediate complications .Marland KitchenLaceration Repair  Date/Time: 06/28/2023 9:40 PM  Performed by: Cameron Ali, PA-C Authorized by: Cameron Ali, PA-C   Consent:    Consent obtained:  Verbal   Consent given by:  Patient   Risks, benefits, and alternatives were discussed: yes     Risks discussed:  Infection, pain, poor cosmetic result and poor wound healing   Alternatives discussed:  No treatment Universal protocol:    Patient identity confirmed:  Verbally with patient Anesthesia:    Anesthesia method:  Local infiltration   Local anesthetic:  Lidocaine 2% WITH epi Laceration details:    Location:  Shoulder/arm   Shoulder/arm location:  L shoulder   Length (cm):  1   Depth (mm):  3 Pre-procedure details:    Preparation:  Patient was prepped and draped in usual sterile fashion Exploration:    Hemostasis achieved with:  Direct pressure   Wound  exploration: entire depth of wound visualized   Treatment:    Area cleansed with:  Povidone-iodine   Amount of cleaning:  Standard   Irrigation solution:  Sterile saline   Irrigation volume:  10 mL   Irrigation method:  Syringe Skin repair:    Repair method:  Sutures   Suture size:  4-0   Suture material:  Nylon   Suture technique:  Simple interrupted   Number of sutures:  1 Approximation:    Approximation:  Close Repair type:    Repair type:  Simple Post-procedure details:    Dressing:  Bulky dressing   Procedure completion:  Tolerated well, no immediate complications .Marland KitchenLaceration Repair  Date/Time: 06/28/2023 9:40 PM  Performed by: Cameron Ali, PA-C Authorized by: Cameron Ali, PA-C   Consent:    Consent obtained:  Verbal   Consent given by:  Patient   Risks, benefits, and alternatives were discussed: yes     Risks discussed:  Infection, pain, poor wound healing and poor cosmetic result   Alternatives discussed:  No treatment Universal protocol:    Patient identity confirmed:  Verbally with patient Anesthesia:    Anesthesia method:  Local infiltration   Local anesthetic:  Lidocaine 2% WITH epi Laceration details:    Location:  Shoulder/arm   Shoulder/arm location:  L shoulder   Length (cm):  1   Depth (mm):  3 Pre-procedure details:    Preparation:  Patient was prepped and draped in usual sterile fashion Exploration:    Hemostasis achieved with:  Direct pressure   Wound exploration: entire depth of wound visualized   Treatment:    Area cleansed with:  Povidone-iodine   Amount of cleaning:  Standard   Irrigation solution:  Sterile saline   Irrigation volume:  10 mL   Irrigation method:  Syringe Skin repair:    Repair method:  Sutures   Suture size:  4-0   Suture material:  Nylon   Suture technique:  Simple interrupted   Number of sutures:  1 Approximation:    Approximation:  Close Repair type:    Repair type:  Simple Post-procedure  details:    Dressing:  Bulky dressing   Procedure completion:  Tolerated well, no immediate complications    MEDICATIONS ORDERED IN ED: Medications  HYDROcodone-acetaminophen (NORCO/VICODIN) 5-325 MG per tablet 1 tablet (1 tablet Oral Given 06/28/23 1707)  lidocaine-EPINEPHrine (XYLOCAINE W/EPI) 2 %-1:200000 (PF) injection 20 mL (20 mLs Intradermal Given 06/28/23 1826)  ketorolac (TORADOL) 15 MG/ML injection 15 mg (15 mg Intramuscular Given 06/28/23 1915)     IMPRESSION / MDM / ASSESSMENT AND PLAN / ED COURSE  I reviewed the triage vital signs and the nursing notes.                             57 year old female presents for evaluation of multiple lacerations after a fall today.  Vital signs stable in triage patient NAD on exam.  Differential diagnosis includes, but is not limited to, laceration, abrasion, contusion.  Patient's presentation is most consistent with acute, uncomplicated illness.  Lacerations repaired as described in the procedure notes above.  Patient educated on wound care.  She is to have her stitches removed in 7 to 10 days.  Patient will be given a short course of oral antibiotics to prevent infection.  Patient was given Norco and Toradol for pain.  She can take Tylenol and ibuprofen at home.  Patient voiced understanding, all questions were answered and she is stable at discharge.    FINAL CLINICAL IMPRESSION(S) / ED DIAGNOSES   Final diagnoses:  Fall, initial encounter     Rx / DC Orders   ED Discharge Orders          Ordered  cephALEXin (KEFLEX) 500 MG capsule  4 times daily        06/28/23 1854             Note:  This document was prepared using Dragon voice recognition software and may include unintentional dictation errors.   Cameron Ali, PA-C 06/28/23 2142    Corena Herter, MD 06/28/23 2322

## 2023-07-01 ENCOUNTER — Encounter: Payer: Self-pay | Admitting: Nurse Practitioner

## 2023-07-01 ENCOUNTER — Ambulatory Visit (INDEPENDENT_AMBULATORY_CARE_PROVIDER_SITE_OTHER): Payer: Medicare Other | Admitting: Nurse Practitioner

## 2023-07-01 ENCOUNTER — Ambulatory Visit (INDEPENDENT_AMBULATORY_CARE_PROVIDER_SITE_OTHER)
Admission: RE | Admit: 2023-07-01 | Discharge: 2023-07-01 | Disposition: A | Discharge status: Home / Self Care | Payer: Medicare Other | Source: Ambulatory Visit | Attending: Nurse Practitioner

## 2023-07-01 VITALS — BP 124/82 | HR 81 | Temp 98.0°F | Ht 62.0 in | Wt 338.6 lb

## 2023-07-01 DIAGNOSIS — Z23 Encounter for immunization: Secondary | ICD-10-CM

## 2023-07-01 DIAGNOSIS — E1165 Type 2 diabetes mellitus with hyperglycemia: Secondary | ICD-10-CM

## 2023-07-01 DIAGNOSIS — M5442 Lumbago with sciatica, left side: Secondary | ICD-10-CM

## 2023-07-01 DIAGNOSIS — W19XXXD Unspecified fall, subsequent encounter: Secondary | ICD-10-CM | POA: Diagnosis not present

## 2023-07-01 DIAGNOSIS — T07XXXA Unspecified multiple injuries, initial encounter: Secondary | ICD-10-CM | POA: Diagnosis not present

## 2023-07-01 DIAGNOSIS — R2231 Localized swelling, mass and lump, right upper limb: Secondary | ICD-10-CM

## 2023-07-01 DIAGNOSIS — W19XXXA Unspecified fall, initial encounter: Secondary | ICD-10-CM | POA: Insufficient documentation

## 2023-07-01 DIAGNOSIS — M79632 Pain in left forearm: Secondary | ICD-10-CM | POA: Diagnosis not present

## 2023-07-01 DIAGNOSIS — Z7985 Long-term (current) use of injectable non-insulin antidiabetic drugs: Secondary | ICD-10-CM

## 2023-07-01 DIAGNOSIS — S50812A Abrasion of left forearm, initial encounter: Secondary | ICD-10-CM | POA: Insufficient documentation

## 2023-07-01 DIAGNOSIS — Z7984 Long term (current) use of oral hypoglycemic drugs: Secondary | ICD-10-CM | POA: Diagnosis not present

## 2023-07-01 DIAGNOSIS — S50812D Abrasion of left forearm, subsequent encounter: Secondary | ICD-10-CM

## 2023-07-01 DIAGNOSIS — J449 Chronic obstructive pulmonary disease, unspecified: Secondary | ICD-10-CM

## 2023-07-01 LAB — POCT GLYCOSYLATED HEMOGLOBIN (HGB A1C): Hemoglobin A1C: 8.2 % — AB (ref 4.0–5.6)

## 2023-07-01 MED ORDER — TIZANIDINE HCL 2 MG PO TABS: 2.0000 mg | ORAL_TABLET | Freq: Every evening | ORAL | 0 refills | Status: DC | PRN

## 2023-07-01 MED ORDER — FLUTICASONE FUROATE-VILANTEROL 200-25 MCG/ACT IN AEPB
1.0000 | INHALATION_SPRAY | Freq: Every day | RESPIRATORY_TRACT | 11 refills | Status: DC
Start: 2023-07-01 — End: 2023-08-06

## 2023-07-01 MED ORDER — DOXYCYCLINE HYCLATE 100 MG PO TABS
100.0000 mg | ORAL_TABLET | Freq: Two times a day (BID) | ORAL | 0 refills | Status: AC
Start: 2023-07-01 — End: 2023-07-08

## 2023-07-01 NOTE — Assessment & Plan Note (Signed)
History of same MRI showed fat necrosis versus scarring.  Patient states the feeling is grown and is putting pressure on her forearms will refer to surgery ambulatory referral placed today

## 2023-07-01 NOTE — Progress Notes (Signed)
Established Patient Office Visit  Subjective   Patient ID: Jasmine Larsen, female    DOB: 04/29/1966  Age: 57 y.o. MRN: [id number]  Chief Complaint  Patient presents with   Diabetes       DM2: patient is currently on metformin and ozempic 0.5mg  weekly. States that she had trouble getting it fileed. States that she has been on 0.25 for 2 weeks. States that she is 133-179 when she checks. States that she is not giving her tea or moutnain dew up. Checking glucose 3-4 times a day   Hospital follow up : Patient was admitted to the hospital 04/27/2019 for for COPD exacerbation and acute hypoxemic respiratory failure.  Patient was discharged on 05/02/2023 she had sepsis due to pneumonia.  Patient did not have a follow-up with me at that juncture she does have an appoint with cardiology coming up that was self-referred.  Most recently patient was seen in the emergency department on9/15/2024 with multiple lacerations that required sutures.  Patient had a total of 20 2 sutures was placed on prophylactic antibiotics to prevent infection.  Unsure when last tetanus shot was  Breo 200/25   Review of Systems  Constitutional:  Negative for chills and fever.  Respiratory:  Negative for shortness of breath.   Cardiovascular:  Negative for chest pain.  Musculoskeletal:  Positive for joint pain.  Skin:  Positive for rash.  Neurological:  Negative for headaches.  Psychiatric/Behavioral:  Negative for hallucinations and suicidal ideas.       Objective:     BP 124/82   Pulse 81   Temp 98 F (36.7 C) (Temporal)   Ht 5\' 2"  (1.575 m)   Wt (!) 338 lb 9.6 oz (153.6 kg)   SpO2 94%   BMI 61.93 kg/m  BP Readings from Last 3 Encounters:  07/01/23 124/82  06/28/23 138/69  06/19/23 (!) 148/84   Wt Readings from Last 3 Encounters:  07/01/23 (!) 338 lb 9.6 oz (153.6 kg)  06/28/23 (!) 346 lb (156.9 kg)  06/19/23 (!) 345 lb 14.4 oz (156.9 kg)      Physical Exam Vitals and nursing note reviewed.   Constitutional:      Appearance: Normal appearance.  Cardiovascular:     Rate and Rhythm: Normal rate and regular rhythm.     Heart sounds: Normal heart sounds.  Pulmonary:     Effort: Pulmonary effort is normal.     Breath sounds: Normal breath sounds.  Skin:    Findings: Erythema and lesion present.          Comments: Redness and warmth to the left anterior distal wrist   Neurological:     Mental Status: She is alert.      Results for orders placed or performed in visit on 07/01/23  POCT glycosylated hemoglobin (Hb A1C)  Result Value Ref Range   Hemoglobin A1C 8.2 (A) 4.0 - 5.6 %   HbA1c POC (<> result, manual entry)     HbA1c, POC (prediabetic range)     HbA1c, POC (controlled diabetic range)        The 10-year ASCVD risk score (Arnett DK, et al., 2019) is: 8.2%    Assessment & Plan:   Problem List Items Addressed This Visit       Respiratory   COPD (chronic obstructive pulmonary disease) (HCC)    History of the same patient was tried on Advair without good relief.  Did get Breo inhaler in the hospital which was beneficial.  Refill  provided rinse mouth out after each use      Relevant Medications   fluticasone furoate-vilanterol (BREO ELLIPTA) 200-25 MCG/ACT AEPB     Endocrine   Uncontrolled type 2 diabetes mellitus with hyperglycemia (HCC) - Primary    Patient currently on metformin and Ozempic.  Has been on Ozempic 0.25 mg for 2 weeks.  She will continue for an additional 2 weeks and then bump up to 0.5 mg thereafter.  Will hold her at that level until she is seen by her new provider      Relevant Orders   POCT glycosylated hemoglobin (Hb A1C) (Completed)     Nervous and Auditory   Acute midline low back pain with left-sided sciatica    History of same has been seeing orthopedist has done physical therapy tizanidine was beneficial she was on 4 mg every 6 hours as needed.  Will write tizanidine 2 mg nightly as needed      Relevant Medications    tiZANidine (ZANAFLEX) 2 MG tablet     Musculoskeletal and Integument   Multiple lacerations    Patient had fallen and had multiple lacerations that required suture repair.  Patient was also placed on Keflex will extend antibiotic with doxycycline      Relevant Medications   doxycycline (VIBRA-TABS) 100 MG tablet   Other Relevant Orders   Tdap vaccine greater than or equal to 7yo IM (Completed)   Abrasion of left forearm    Multiple abrasions with unknown tetanus vaccine updated today.      Relevant Orders   Tdap vaccine greater than or equal to 7yo IM (Completed)     Other   Mass of right forearm    History of same MRI showed fat necrosis versus scarring.  Patient states the feeling is grown and is putting pressure on her forearms will refer to surgery ambulatory referral placed today      Relevant Orders   Ambulatory referral to General Surgery   Fall    Patient fell outside does have some tenderness to the left forearm pending x-rays continue over-the-counter analgesics as needed      Relevant Orders   Tdap vaccine greater than or equal to 7yo IM (Completed)   DG Wrist Complete Left (Completed)   DG Forearm Left (Completed)    Return in about 5 days (around 07/06/2023) for suture removal .    Audria Nine, NP

## 2023-07-01 NOTE — Assessment & Plan Note (Signed)
History of the same patient was tried on Advair without good relief.  Did get Breo inhaler in the hospital which was beneficial.  Refill provided rinse mouth out after each use

## 2023-07-01 NOTE — Assessment & Plan Note (Signed)
History of same has been seeing orthopedist has done physical therapy tizanidine was beneficial she was on 4 mg every 6 hours as needed.  Will write tizanidine 2 mg nightly as needed

## 2023-07-01 NOTE — Patient Instructions (Addendum)
Nice to see you today After you have done 4 week of the ozempic 0.25mg  go up to 0.5mg  weekly Follow up with Dr. Clent Ridges as scheduled and Dr. Kirke Corin   Follow up with me on Monday for suture removal

## 2023-07-01 NOTE — Assessment & Plan Note (Signed)
Patient had fallen and had multiple lacerations that required suture repair.  Patient was also placed on Keflex will extend antibiotic with doxycycline

## 2023-07-01 NOTE — Assessment & Plan Note (Signed)
Patient fell outside does have some tenderness to the left forearm pending x-rays continue over-the-counter analgesics as needed

## 2023-07-01 NOTE — Assessment & Plan Note (Signed)
Multiple abrasions with unknown tetanus vaccine updated today.

## 2023-07-01 NOTE — Assessment & Plan Note (Signed)
Patient currently on metformin and Ozempic.  Has been on Ozempic 0.25 mg for 2 weeks.  She will continue for an additional 2 weeks and then bump up to 0.5 mg thereafter.  Will hold her at that level until she is seen by her new provider

## 2023-07-03 ENCOUNTER — Other Ambulatory Visit: Payer: Self-pay | Admitting: Nurse Practitioner

## 2023-07-03 DIAGNOSIS — S62002A Unspecified fracture of navicular [scaphoid] bone of left wrist, initial encounter for closed fracture: Secondary | ICD-10-CM

## 2023-07-06 ENCOUNTER — Ambulatory Visit (INDEPENDENT_AMBULATORY_CARE_PROVIDER_SITE_OTHER): Payer: Medicare Other | Admitting: Nurse Practitioner

## 2023-07-06 VITALS — BP 132/88 | HR 83 | Temp 98.0°F | Ht 62.0 in | Wt 341.6 lb

## 2023-07-06 DIAGNOSIS — Z4802 Encounter for removal of sutures: Secondary | ICD-10-CM | POA: Diagnosis not present

## 2023-07-06 DIAGNOSIS — M25532 Pain in left wrist: Secondary | ICD-10-CM | POA: Insufficient documentation

## 2023-07-06 NOTE — Assessment & Plan Note (Signed)
Patient still experiencing pain using over-the-counter analgesics without much benefit.  Has CT scan scheduled for tomorrow morning.  Pending result continue OTC analgesic currently

## 2023-07-06 NOTE — Progress Notes (Signed)
Established Patient Office Visit  Subjective   Patient ID: Jasmine Larsen, female    DOB: February 09, 1966  Age: 57 y.o. MRN: [id number]  Chief Complaint  Patient presents with   Suture / Staple Removal    Pt states of pain and itchy feeling in wrist. Pt complains of yellow color on wound. Pt states she has been keeping clean.     HPI  Suture removal: Patient was seen in emergency department on 06/28/2023 with a fall that required multiple lacerations being sutured.  Patient saw me in office on 07/01/2019 for for routine follow-up with her diabetes.  Patient was placed on doxycycline on top of the Keflex at the emergency department and prescribed.  Patient seen tender on the wrist/forearm and pictures were obtained.  Concern for possible lucency through the waist-distal pole of the scaphoid.  They did recommend getting a CT of the left wrist.  This has been ordered and is scheduled for tomorrow (07/07/2023).  Patient states that some of the lesions more distal that are itchy.  She has been taking antibiotics as prescribed.  Having some serous sanguinous discharge.   19 stitihes were removed    Review of Systems  Constitutional:  Negative for fever.  Musculoskeletal:  Positive for joint pain.  Skin:  Positive for itching.      Objective:     BP 132/88   Pulse 83   Temp 98 F (36.7 C) (Temporal)   Ht 5\' 2"  (1.575 m)   Wt (!) 341 lb 9.6 oz (154.9 kg)   SpO2 94%   BMI 62.48 kg/m    Physical Exam Vitals and nursing note reviewed.  Constitutional:      Appearance: Normal appearance.  Cardiovascular:     Rate and Rhythm: Normal rate and regular rhythm.     Heart sounds: Normal heart sounds.  Pulmonary:     Effort: Pulmonary effort is normal.     Breath sounds: Normal breath sounds.  Neurological:     Mental Status: She is alert.      No results found for any visits on 07/06/23.    The 10-year ASCVD risk score (Arnett DK, et al., 2019) is: 9.3%    Assessment & Plan:    Problem List Items Addressed This Visit       Other   Left wrist pain    Patient still experiencing pain using over-the-counter analgesics without much benefit.  Has CT scan scheduled for tomorrow morning.  Pending result continue OTC analgesic currently      Visit for suture removal - Primary    Patient presents for suture removal 8 days since sutures were placed.  Removed 19 total sutures.  Did apply Steri-Strips to the larger of the 2 wounds on the left posterior upper arm.  Patient has any granulation tissue that had grown into sutures that was removed.  Patient tolerated procedure well.  No immediate complications no bleeding.  Reviewed signs and symptoms when to be reevaluated       Return if symptoms worsen or fail to improve, for As scheduled wth Dr Clent Ridges .    Audria Nine, NP

## 2023-07-06 NOTE — Assessment & Plan Note (Signed)
Patient presents for suture removal 8 days since sutures were placed.  Removed 19 total sutures.  Did apply Steri-Strips to the larger of the 2 wounds on the left posterior upper arm.  Patient has any granulation tissue that had grown into sutures that was removed.  Patient tolerated procedure well.  No immediate complications no bleeding.  Reviewed signs and symptoms when to be reevaluated

## 2023-07-06 NOTE — Patient Instructions (Signed)
Nice to see you today.  It is okay to use a nonadherent gauze for patient's upper arm.  Gently clean with plain soap and water daily pat dry.  Leave the Steri-Strips on until they fall off on their own.  Continue taking all the antibiotics as prescribed until complete.  Follow-up with me as needed keep appointment with Dr. Clent Ridges as scheduled

## 2023-07-07 ENCOUNTER — Ambulatory Visit
Admission: RE | Admit: 2023-07-07 | Discharge: 2023-07-07 | Disposition: A | Discharge status: Home / Self Care | Payer: Medicare Other | Source: Ambulatory Visit | Attending: Nurse Practitioner | Admitting: Nurse Practitioner

## 2023-07-07 DIAGNOSIS — S62002A Unspecified fracture of navicular [scaphoid] bone of left wrist, initial encounter for closed fracture: Secondary | ICD-10-CM | POA: Insufficient documentation

## 2023-07-09 ENCOUNTER — Other Ambulatory Visit: Payer: Self-pay | Admitting: Nurse Practitioner

## 2023-07-09 DIAGNOSIS — E1165 Type 2 diabetes mellitus with hyperglycemia: Secondary | ICD-10-CM

## 2023-07-12 ENCOUNTER — Encounter: Payer: Self-pay | Admitting: Nurse Practitioner

## 2023-07-12 DIAGNOSIS — M5442 Lumbago with sciatica, left side: Secondary | ICD-10-CM

## 2023-07-12 DIAGNOSIS — M25532 Pain in left wrist: Secondary | ICD-10-CM

## 2023-07-13 MED ORDER — METFORMIN HCL 500 MG PO TABS
1000.0000 mg | ORAL_TABLET | Freq: Two times a day (BID) | ORAL | 0 refills | Status: DC
Start: 2023-07-13 — End: 2023-08-13

## 2023-07-13 MED ORDER — BLOOD GLUCOSE MONITORING SUPPL DEVI
1.0000 | 0 refills | Status: DC
Start: 2023-07-13 — End: 2023-08-13

## 2023-07-13 MED ORDER — ACCU-CHEK SOFTCLIX LANCETS MISC: 1.0000 | Freq: Two times a day (BID) | 3 refills | Status: DC

## 2023-07-13 NOTE — Addendum Note (Signed)
Addended by: Eden Emms on: 07/13/2023 08:21 PM   Modules accepted: Orders

## 2023-07-15 ENCOUNTER — Telehealth: Payer: Self-pay | Admitting: *Deleted

## 2023-07-15 NOTE — Telephone Encounter (Signed)
Lmovm to verify card hx*

## 2023-07-16 ENCOUNTER — Ambulatory Visit: Payer: Medicare Other | Admitting: Surgery

## 2023-07-16 ENCOUNTER — Encounter: Payer: Self-pay | Admitting: Surgery

## 2023-07-16 VITALS — BP 125/80 | HR 86 | Temp 98.0°F | Ht 62.0 in | Wt 336.0 lb

## 2023-07-16 DIAGNOSIS — M7989 Other specified soft tissue disorders: Secondary | ICD-10-CM | POA: Diagnosis not present

## 2023-07-16 DIAGNOSIS — R2231 Localized swelling, mass and lump, right upper limb: Secondary | ICD-10-CM

## 2023-07-16 DIAGNOSIS — Z6841 Body Mass Index (BMI) 40.0 and over, adult: Secondary | ICD-10-CM

## 2023-07-16 NOTE — Progress Notes (Signed)
Patient ID: Jasmine Larsen, female   DOB: [birth year], 57 y.o.   MRN: [id number]  Chief Complaint: Tender/painful right arm mass  History of Present Illness Jasmine Larsen is a 57 y.o. female with the above right arm mass present since 2009.  She reports approximately 2 years ago it began increasing in size.  And over the last 7 months she reports it became painful, with shooting pains that radiate down her right arm.  It seems to involve the smallest of digits in her hand.  She reports having CT scans and MRI, MRI was last May.  Past Medical History Past Medical History:  Diagnosis Date   Asthma    CHF (congestive heart failure) (HCC)    COPD (chronic obstructive pulmonary disease) (HCC)    Diabetes mellitus without complication (HCC)    Miscarriage    Multiple gastric ulcers       Past Surgical History:  Procedure Laterality Date   CESAREAN SECTION     x3    Allergies  Allergen Reactions   Cymbalta [Duloxetine Hcl]     SI thoughts    Current Outpatient Medications  Medication Sig Dispense Refill   Accu-Chek Softclix Lancets lancets 1 each by Other route 2 (two) times daily. 100 each 3   albuterol (VENTOLIN HFA) 108 (90 Base) MCG/ACT inhaler Inhale 2 puffs into the lungs every 6 (six) hours as needed for wheezing or shortness of breath. 8 g 1   blood glucose meter kit and supplies KIT Dispense based on patient and insurance preference. Use up to four times daily as directed. (FOR ICD-9 250.00, 250.01). 1 each 0   Blood Glucose Monitoring Suppl DEVI 1 each by Does not apply route as directed. May substitute to any manufacturer covered by patient's insurance. 1 each 0   chlorpheniramine-HYDROcodone (TUSSIONEX) 10-8 MG/5ML Take 5 mLs by mouth every 12 (twelve) hours as needed for cough. 40 mL 0   dicyclomine (BENTYL) 20 MG tablet      fluticasone furoate-vilanterol (BREO ELLIPTA) 200-25 MCG/ACT AEPB Inhale 1 puff into the lungs daily. 1 each 11   gabapentin (NEURONTIN) 300 MG  capsule Take 1 capsule (300 mg total) by mouth 3 (three) times daily. 90 capsule 2   Glucose Blood (BLOOD GLUCOSE TEST STRIPS) STRP 1 each by In Vitro route in the morning, at noon, and at bedtime. May substitute to any manufacturer covered by patient's insurance. 100 strip 3   Lancets Misc. MISC 1 each by Does not apply route in the morning, at noon, and at bedtime. May substitute to any manufacturer covered by patient's insurance. 100 each 3   metFORMIN (GLUCOPHAGE) 500 MG tablet Take 2 tablets (1,000 mg total) by mouth 2 (two) times daily with a meal. 360 tablet 0   rosuvastatin (CRESTOR) 5 MG tablet Take 1 tablet (5 mg total) by mouth daily. 90 tablet 1   Semaglutide,0.25 or 0.5MG /DOS, (OZEMPIC, 0.25 OR 0.5 MG/DOSE,) 2 MG/3ML SOPN Inject 0.5 mg into the skin once a week. 2 mL 1   tiZANidine (ZANAFLEX) 2 MG tablet Take 1 tablet (2 mg total) by mouth at bedtime as needed for muscle spasms. 30 tablet 0   No current facility-administered medications for this visit.    Family History Family History  Problem Relation Age of Onset   Asthma Mother    Diabetes Mother    COPD Mother    Heart Problems Mother    Diabetes Brother    COPD Brother    Congestive  Heart Failure Brother    Alcohol abuse Maternal Grandmother    Tuberculosis Maternal Grandmother    Lung cancer Maternal Grandmother       Social History Social History   Tobacco Use   Smoking status: Every Day    Current packs/day: 2.00    Average packs/day: 2.0 packs/day for 40.0 years (80.0 ttl pk-yrs)    Types: Cigarettes    Passive exposure: Past   Smokeless tobacco: Never  Vaping Use   Vaping status: Never Used  Substance Use Topics   Alcohol use: No   Drug use: No        Review of Systems  Constitutional:  Positive for weight loss.  HENT: Negative.    Eyes: Negative.   Respiratory:  Positive for cough and shortness of breath.   Cardiovascular:  Positive for leg swelling.  Gastrointestinal: Negative.    Genitourinary: Negative.   Skin: Negative.   Neurological: Negative.   Psychiatric/Behavioral: Negative.       Physical Exam Blood pressure 125/80, pulse 86, temperature 98 F (36.7 C), height 5\' 2"  (1.575 m), weight (!) 336 lb (152.4 kg), SpO2 98%. Last Weight  Most recent update: 07/16/2023  8:52 AM    Weight  152.4 kg (336 lb)               CONSTITUTIONAL: Morbidly obese, resting in wheelchair, appropriately responsive and aware without distress.   EYES: Sclera non-icteric.   EARS, NOSE, MOUTH AND THROAT:  The oropharynx is clear. Oral mucosa is pink and moist.   Hearing is intact to voice.  NECK: Trachea is midline, and there is no jugular venous distension.  LYMPH NODES:  Lymph nodes in the neck are not appreciated. RESPIRATORY:   Normal respiratory effort without pathologic use of accessory muscles. CARDIOVASCULAR:  Well perfused.  GI: The abdomen is morbidly obese, otherwise soft, nontender, and nondistended.  MUSCULOSKELETAL:  Symmetrical muscle tone appreciated in all four extremities.    On the ulnar aspect, volar/medial right mid forearm is a fairly irregular multilobulated 5-6+ centimeter soft tissue mass, its tender to palpation, not well-circumscribed, it feels immobile/fixed to the underlying tendinous/ligamentous/ulnar bone.  There is no overlying skin discoloration.  SKIN: Skin turgor is normal. No pathologic skin lesions appreciated.  NEUROLOGIC:  Motor and sensation appear grossly normal.  Cranial nerves are grossly without defect. PSYCH:  Alert and oriented to person, place and time. Affect is appropriate for situation.  Data Reviewed I have personally reviewed what is currently available of the patient's imaging, recent labs and medical records.   Labs:     Latest Ref Rng & Units 05/02/2023    4:22 AM 04/30/2023    2:45 AM 04/29/2023    3:34 AM  CBC  WBC 4.0 - 10.5 K/uL 13.6  15.1  17.5   Hemoglobin 12.0 - 15.0 g/dL 64.4  03.4  74.2   Hematocrit 36.0 -  46.0 % 35.6  37.9  38.8   Platelets 150 - 400 K/uL 275  271  268       Latest Ref Rng & Units 05/02/2023    4:22 AM 04/29/2023    3:34 AM 04/28/2023    6:03 AM  CMP  Glucose 70 - 99 mg/dL 595  638  756   BUN 6 - 20 mg/dL 16  23  13    Creatinine 0.44 - 1.00 mg/dL 4.33  2.95  1.88   Sodium 135 - 145 mmol/L 135  135  134   Potassium 3.5 -  5.1 mmol/L 4.3  3.9  3.9   Chloride 98 - 111 mmol/L 102  101  100   CO2 22 - 32 mmol/L 27  26  23    Calcium 8.9 - 10.3 mg/dL 7.5  7.9  8.1      Imaging: Radiological images reviewed:  CLINICAL DATA:  No known injury, not or mass along the right mid distal forearm causing pain for 7 years.   EXAM: MRI OF THE RIGHT FOREARM WITHOUT AND WITH CONTRAST   TECHNIQUE: Multiplanar, multisequence MR imaging of the right forearm was performed before and after the administration of intravenous contrast.   CONTRAST:  10mL GADAVIST GADOBUTROL 1 MMOL/ML IV SOLN   COMPARISON:  None Available.   FINDINGS: Bones/Joint/Cartilage   No marrow signal abnormality. No fracture or dislocation. Normal alignment. No joint effusion. No erosive changes. No periostitis.   Ligaments   Collateral ligaments are intact.   Muscles and Tendons Mild tendinosis of the extensor carpi ulnaris tendon. Remainder of the extensor compartment tendons are intact. Flexor compartment tendons are intact. Muscles are normal.   Soft tissue No fluid collection or hematoma. No soft tissue mass. 6 mm spiculated low signal area with enhancement in the subcutaneous fat along the volar ulnar aspect of the mid-distal forearm concerning for a small area of fat necrosis or scarring.   IMPRESSION: 1. Mild tendinosis of the extensor carpi ulnaris tendon. 2. A 6 mm spiculated low signal area with enhancement in the subcutaneous fat along the volar ulnar aspect of the mid-distal forearm concerning for a small area of fat necrosis or scarring.     Electronically Signed   By: Elige Ko  M.D.   On: 06/22/2023 09:12 Within last 24 hrs: No results found.  Assessment    Mid right forearm mass overlying the ulna, significantly symptomatic. Patient Active Problem List   Diagnosis Date Noted   Left wrist pain 07/06/2023   Visit for suture removal 07/06/2023   Fall 07/01/2023   Multiple lacerations 07/01/2023   Abrasion of left forearm 07/01/2023   COPD with acute exacerbation (HCC) 04/27/2023   Sepsis due to pneumonia (HCC) 04/27/2023   Mass of right forearm 03/31/2023   Acute pain of left shoulder 01/26/2023   Uncontrolled type 2 diabetes mellitus with hyperglycemia (HCC) 12/15/2022   Acute midline low back pain with left-sided sciatica 12/15/2022   Lower extremity edema 12/15/2022   Soft tissue mass 12/15/2022   Anxiety and depression 12/15/2022   Pneumonia due to COVID-19 virus 07/19/2020   Chronic diastolic CHF (congestive heart failure) (HCC) 07/19/2020   COPD (chronic obstructive pulmonary disease) (HCC) 07/19/2020   Hypokalemia 07/19/2020   Hypocalcemia 07/19/2020   Tobacco abuse 07/19/2020   Acute respiratory failure with hypoxia (HCC) 07/19/2020   Hypophosphatemia 07/19/2020   Nausea, vomiting and diarrhea 07/19/2020   Chest pain 07/13/2016   Shortness of breath 07/13/2016   COPD with asthma (HCC) 07/13/2016   BMI 60.0-69.9, adult (HCC) 07/13/2016    Plan    I believe since this is her dominant arm and involving nerves and seemingly immobile from the underlying ulna, I feel it is prudent to defer further evaluation to orthopedics.  I discussed this with the patient and her daughter, with apologies of having her come here.  I believe they understand a desire to see a specialist who is much more acquainted with tendons nerves and bone in this area.   Face-to-face time spent with the patient and accompanying care providers(if present) was 30  minutes, with more than 50% of the time spent counseling, educating, and coordinating care of the patient.    These  notes generated with voice recognition software. I apologize for typographical errors.  Campbell Lerner M.D., FACS 07/16/2023, 9:31 AM

## 2023-07-16 NOTE — Patient Instructions (Addendum)
We do suggest that you be seen by [facility] for this forearm mass due to all the structural tissue involvement.   We will send a referral for this to be assessed.     Follow-up with our office as needed.  Please call and ask to speak with a nurse if you develop questions or concerns.

## 2023-07-20 ENCOUNTER — Encounter: Payer: Self-pay | Admitting: Nurse Practitioner

## 2023-07-20 DIAGNOSIS — E1165 Type 2 diabetes mellitus with hyperglycemia: Secondary | ICD-10-CM

## 2023-07-21 ENCOUNTER — Ambulatory Visit: Payer: Medicare Other | Attending: Cardiovascular Disease | Admitting: Cardiovascular Disease

## 2023-07-21 ENCOUNTER — Encounter: Payer: Self-pay | Admitting: Cardiovascular Disease

## 2023-07-21 ENCOUNTER — Telehealth: Payer: Self-pay | Admitting: Nurse Practitioner

## 2023-07-21 VITALS — BP 156/70 | HR 84 | Ht 62.0 in | Wt 340.1 lb

## 2023-07-21 DIAGNOSIS — Z720 Tobacco use: Secondary | ICD-10-CM | POA: Diagnosis present

## 2023-07-21 DIAGNOSIS — R0602 Shortness of breath: Secondary | ICD-10-CM

## 2023-07-21 DIAGNOSIS — E1165 Type 2 diabetes mellitus with hyperglycemia: Secondary | ICD-10-CM

## 2023-07-21 DIAGNOSIS — Z6841 Body Mass Index (BMI) 40.0 and over, adult: Secondary | ICD-10-CM

## 2023-07-21 DIAGNOSIS — I5032 Chronic diastolic (congestive) heart failure: Secondary | ICD-10-CM

## 2023-07-21 MED ORDER — ACCU-CHEK SOFTCLIX LANCETS MISC: 1.0000 | Freq: Two times a day (BID) | 3 refills | Status: DC

## 2023-07-21 MED ORDER — BLOOD GLUCOSE TEST VI STRP
1.0000 | ORAL_STRIP | Freq: Two times a day (BID) | 3 refills | Status: DC
Start: 2023-07-21 — End: 2023-07-22

## 2023-07-21 NOTE — Telephone Encounter (Signed)
Pharmacy called in stating that they would need a new prescription sent in for test strips and lancets stating that patient can only test once a day.She said that with her Part B medicare,they are only covering for her to test once a day,because she is not on insulin.

## 2023-07-21 NOTE — Patient Instructions (Signed)
Medication Instructions:  No changes *If you need a refill on your cardiac medications before your next appointment, please call your pharmacy*   Lab Work: None ordered If you have labs (blood work) drawn today and your tests are completely normal, you will receive your results only by: MyChart Message (if you have MyChart) OR A paper copy in the mail If you have any lab test that is abnormal or we need to change your treatment, we will call you to review the results.   Testing/Procedures: Your physician has requested that you have an echocardiogram. Echocardiography is a painless test that uses sound waves to create images of your heart. It provides your doctor with information about the size and shape of your heart and how well your heart's chambers and valves are working.   You may receive an ultrasound enhancing agent through an IV if needed to better visualize your heart during the echo. This procedure takes approximately one hour.  There are no restrictions for this procedure.  This will take place at [address] [address], [place] [address]    Follow-Up: At [facility], you and your health needs are our priority.  As part of our continuing mission to provide you with exceptional heart care, we have created designated Provider Care Teams.  These Care Teams include your primary Cardiologist (physician) and Advanced Practice Providers (APPs -  Physician Assistants and Nurse Practitioners) who all work together to provide you with the care you need, when you need it.  We recommend signing up for the patient portal called "MyChart".  Sign up information is provided on this After Visit Summary.  MyChart is used to connect with patients for Virtual Visits (Telemedicine).  Patients are able to view lab/test results, encounter notes, upcoming appointments, etc.  Non-urgent messages can be sent to your provider as well.   To learn more about what you can  do with MyChart, go to [email or web address].    Your next appointment:   Follow up pending results

## 2023-07-21 NOTE — Progress Notes (Signed)
Cardiology Office Note   Date:  07/21/2023   ID:  Jasmine Larsen, DOB 10-06-1966, MRN [id number]  PCP:  Eden Emms, NP  Cardiologist:   Lorine Bears, MD   Chief Complaint  Patient presents with   New Patient (Initial Visit)    CHF c/o edema ankles/legs and would like to discuss referral to chf clinic. Meds reviewed verbally with pt.      History of Present Illness: Jasmine Larsen is a 57 y.o. female who presents for evaluation of congestive heart failure. She has known history of morbid obesity with BMI greater than 60, COPD, tobacco use, diabetes mellitus and hyperlipidemia. She was seen by Dr. Gwen Pounds in 2017 for inpatient consultation when she had volume overload in the setting of COPD exacerbation.  She had an echocardiogram done at that time which showed normal LV systolic function with mild mitral regurgitation. She was hospitalized in July of this year with sepsis due to pneumonia.  She improved with antibiotics.  She was told to follow-up with a cardiologist given reported history of chronic diastolic heart failure. She report chronic lower extremity edema likely lymphedema and chronic shortness of breath.  Unfortunately, she continues to smoke 2 packs/day.  She is mostly wheelchair-bound and does not drive.  She lives with her daughter who assist with activities of daily living.    Past Medical History:  Diagnosis Date   Asthma    CHF (congestive heart failure) (HCC)    COPD (chronic obstructive pulmonary disease) (HCC)    Diabetes mellitus without complication (HCC)    Miscarriage    Multiple gastric ulcers     Past Surgical History:  Procedure Laterality Date   CESAREAN SECTION     x3     Current Outpatient Medications  Medication Sig Dispense Refill   Accu-Chek Softclix Lancets lancets 1 each by Other route 2 (two) times daily. 100 each 3   albuterol (VENTOLIN HFA) 108 (90 Base) MCG/ACT inhaler Inhale 2 puffs into the lungs every 6 (six) hours as  needed for wheezing or shortness of breath. 8 g 1   blood glucose meter kit and supplies KIT Dispense based on patient and insurance preference. Use up to four times daily as directed. (FOR ICD-9 250.00, 250.01). 1 each 0   Blood Glucose Monitoring Suppl DEVI 1 each by Does not apply route as directed. May substitute to any manufacturer covered by patient's insurance. 1 each 0   dicyclomine (BENTYL) 20 MG tablet      fluticasone furoate-vilanterol (BREO ELLIPTA) 200-25 MCG/ACT AEPB Inhale 1 puff into the lungs daily. 1 each 11   gabapentin (NEURONTIN) 300 MG capsule Take 1 capsule (300 mg total) by mouth 3 (three) times daily. 90 capsule 2   Glucose Blood (BLOOD GLUCOSE TEST STRIPS) STRP 1 each by In Vitro route in the morning and at bedtime. May substitute to any manufacturer covered by patient's insurance. 100 strip 3   Lancets Misc. MISC 1 each by Does not apply route in the morning, at noon, and at bedtime. May substitute to any manufacturer covered by patient's insurance. 100 each 3   metFORMIN (GLUCOPHAGE) 500 MG tablet Take 2 tablets (1,000 mg total) by mouth 2 (two) times daily with a meal. 360 tablet 0   rosuvastatin (CRESTOR) 5 MG tablet Take 1 tablet (5 mg total) by mouth daily. 90 tablet 1   Semaglutide,0.25 or 0.5MG /DOS, (OZEMPIC, 0.25 OR 0.5 MG/DOSE,) 2 MG/3ML SOPN Inject 0.5 mg into the skin  once a week. 2 mL 1   tiZANidine (ZANAFLEX) 2 MG tablet Take 1 tablet (2 mg total) by mouth at bedtime as needed for muscle spasms. 30 tablet 0   No current facility-administered medications for this visit.    Allergies:   Cymbalta [duloxetine hcl]    Social History:  The patient  reports that she has been smoking cigarettes. She has a 80 pack-year smoking history. She has been exposed to tobacco smoke. She has never used smokeless tobacco. She reports that she does not drink alcohol and does not use drugs.   Family History:  The patient's family history includes Alcohol abuse in her maternal  grandmother; Asthma in her mother; COPD in her brother and mother; Congestive Heart Failure in her brother; Diabetes in her brother and mother; Heart Problems in her mother; Lung cancer in her maternal grandmother; Tuberculosis in her maternal grandmother.    ROS:  Please see the history of present illness.   Otherwise, review of systems are positive for none.   All other systems are reviewed and negative.    PHYSICAL EXAM: VS:  BP (!) 156/70 (BP Location: Left Wrist, Patient Position: Sitting, Cuff Size: Normal)   Pulse 84   Ht 5\' 2"  (1.575 m)   Wt (!) 340 lb 2 oz (154.3 kg)   SpO2 98%   BMI 62.21 kg/m  , BMI Body mass index is 62.21 kg/m. GEN: Well nourished, well developed, in no acute distress  HEENT: normal  Neck: no JVD, carotid bruits, or masses Cardiac: RRR; no murmurs, rubs, or gallops, moderate lymphedema Respiratory: Diminished breath sounds bilaterally, normal work of breathing GI: soft, nontender, nondistended, + BS MS: no deformity or atrophy  Skin: warm and dry, no rash Neuro:  Strength and sensation are intact Psych: euthymic mood, full affect   EKG:  EKG is ordered today. The ekg ordered today demonstrates : Normal sinus rhythm Possible Anterior infarct , age undetermined When compared with ECG of 27-Apr-2023 20:02, Abberant conduction is no longer Present Borderline criteria for Anterior infarct are now Present Nonspecific T wave abnormality no longer evident in Lateral leads    Recent Labs: 12/15/2022: Pro B Natriuretic peptide (BNP) 15.0; TSH 3.87 03/31/2023: ALT 10 05/02/2023: BUN 16; Creatinine, Ser 0.57; Hemoglobin 11.6; Platelets 275; Potassium 4.3; Sodium 135    Lipid Panel    Component Value Date/Time   CHOL 132 03/31/2023 1018   TRIG 97.0 03/31/2023 1018   HDL 36.40 (L) 03/31/2023 1018   CHOLHDL 4 03/31/2023 1018   VLDL 19.4 03/31/2023 1018   LDLCALC 76 03/31/2023 1018      Wt Readings from Last 3 Encounters:  07/21/23 (!) 340 lb 2 oz  (154.3 kg)  07/16/23 (!) 336 lb (152.4 kg)  07/06/23 (!) 341 lb 9.6 oz (154.9 kg)          07/21/2023    2:47 PM  PAD Screen  Previous PAD dx? No  Previous surgical procedure? Yes  Pain with walking? No  Feet/toe relief with dangling? No  Painful, non-healing ulcers? No  Extremities discolored? No      ASSESSMENT AND PLAN:  1.  Shortness of breath: I suspect is likely due to underlying COPD and continued extensive tobacco use.  She is labeled as having chronic diastolic heart failure but evaluating her volume status is very challenging.  I requested an echocardiogram for evaluation.  Her BNP in March was normal at 15.  2.  Lower extremity edema: Likely lymphedema and chronic  venous insufficiency.  3.  COPD  4.  Morbid obesity: Currently on Ozempic but no significant weight loss so far.  5.  Tobacco use: Discussed the importance of smoking cessation.    Disposition:   FU with me as needed.  Signed,  Lorine Bears, MD  07/21/2023 3:04 PM    [facility]

## 2023-07-22 MED ORDER — BLOOD GLUCOSE TEST VI STRP: 1.0000 | ORAL_STRIP | Freq: Every day | 2 refills | Status: DC

## 2023-07-22 MED ORDER — ACCU-CHEK SOFTCLIX LANCETS MISC: 1.0000 | Freq: Every day | 2 refills | Status: DC

## 2023-07-22 NOTE — Telephone Encounter (Signed)
Scripts corrected and sent in

## 2023-08-06 ENCOUNTER — Telehealth: Payer: Self-pay

## 2023-08-06 ENCOUNTER — Encounter: Payer: Self-pay | Admitting: Family Medicine

## 2023-08-06 ENCOUNTER — Ambulatory Visit (INDEPENDENT_AMBULATORY_CARE_PROVIDER_SITE_OTHER): Payer: Medicare Other | Admitting: Family Medicine

## 2023-08-06 ENCOUNTER — Other Ambulatory Visit: Payer: Self-pay

## 2023-08-06 VITALS — BP 130/84 | HR 81 | Temp 98.0°F | Resp 16 | Ht 60.5 in | Wt 333.0 lb

## 2023-08-06 DIAGNOSIS — J4489 Other specified chronic obstructive pulmonary disease: Secondary | ICD-10-CM

## 2023-08-06 DIAGNOSIS — M5431 Sciatica, right side: Secondary | ICD-10-CM

## 2023-08-06 DIAGNOSIS — R0602 Shortness of breath: Secondary | ICD-10-CM

## 2023-08-06 DIAGNOSIS — Z1211 Encounter for screening for malignant neoplasm of colon: Secondary | ICD-10-CM

## 2023-08-06 DIAGNOSIS — E118 Type 2 diabetes mellitus with unspecified complications: Secondary | ICD-10-CM | POA: Diagnosis not present

## 2023-08-06 DIAGNOSIS — E1140 Type 2 diabetes mellitus with diabetic neuropathy, unspecified: Secondary | ICD-10-CM

## 2023-08-06 DIAGNOSIS — M7732 Calcaneal spur, left foot: Secondary | ICD-10-CM

## 2023-08-06 DIAGNOSIS — R600 Localized edema: Secondary | ICD-10-CM

## 2023-08-06 DIAGNOSIS — M5432 Sciatica, left side: Secondary | ICD-10-CM

## 2023-08-06 DIAGNOSIS — Z114 Encounter for screening for human immunodeficiency virus [HIV]: Secondary | ICD-10-CM

## 2023-08-06 DIAGNOSIS — E559 Vitamin D deficiency, unspecified: Secondary | ICD-10-CM | POA: Diagnosis not present

## 2023-08-06 DIAGNOSIS — R197 Diarrhea, unspecified: Secondary | ICD-10-CM

## 2023-08-06 DIAGNOSIS — Z1231 Encounter for screening mammogram for malignant neoplasm of breast: Secondary | ICD-10-CM

## 2023-08-06 DIAGNOSIS — E785 Hyperlipidemia, unspecified: Secondary | ICD-10-CM

## 2023-08-06 DIAGNOSIS — R300 Dysuria: Secondary | ICD-10-CM | POA: Diagnosis not present

## 2023-08-06 DIAGNOSIS — Z7984 Long term (current) use of oral hypoglycemic drugs: Secondary | ICD-10-CM | POA: Diagnosis not present

## 2023-08-06 DIAGNOSIS — I509 Heart failure, unspecified: Secondary | ICD-10-CM

## 2023-08-06 DIAGNOSIS — M7731 Calcaneal spur, right foot: Secondary | ICD-10-CM

## 2023-08-06 DIAGNOSIS — Z7985 Long-term (current) use of injectable non-insulin antidiabetic drugs: Secondary | ICD-10-CM

## 2023-08-06 DIAGNOSIS — J440 Chronic obstructive pulmonary disease with acute lower respiratory infection: Secondary | ICD-10-CM

## 2023-08-06 DIAGNOSIS — E1169 Type 2 diabetes mellitus with other specified complication: Secondary | ICD-10-CM

## 2023-08-06 DIAGNOSIS — R8290 Unspecified abnormal findings in urine: Secondary | ICD-10-CM

## 2023-08-06 DIAGNOSIS — E538 Deficiency of other specified B group vitamins: Secondary | ICD-10-CM

## 2023-08-06 DIAGNOSIS — J209 Acute bronchitis, unspecified: Secondary | ICD-10-CM

## 2023-08-06 DIAGNOSIS — Z1159 Encounter for screening for other viral diseases: Secondary | ICD-10-CM

## 2023-08-06 DIAGNOSIS — Z720 Tobacco use: Secondary | ICD-10-CM

## 2023-08-06 DIAGNOSIS — J441 Chronic obstructive pulmonary disease with (acute) exacerbation: Secondary | ICD-10-CM

## 2023-08-06 DIAGNOSIS — R112 Nausea with vomiting, unspecified: Secondary | ICD-10-CM

## 2023-08-06 DIAGNOSIS — E119 Type 2 diabetes mellitus without complications: Secondary | ICD-10-CM

## 2023-08-06 LAB — POC URINALSYSI DIPSTICK (AUTOMATED)
Bilirubin, UA: NEGATIVE
Blood, UA: NEGATIVE
Glucose, UA: NEGATIVE
Ketones, UA: NEGATIVE
Nitrite, UA: POSITIVE
Protein, UA: NEGATIVE
Spec Grav, UA: 1.02 (ref 1.010–1.025)
Urobilinogen, UA: 1 U/dL
pH, UA: 8.5 — AB (ref 5.0–8.0)

## 2023-08-06 MED ORDER — BREZTRI AEROSPHERE 160-9-4.8 MCG/ACT IN AERO: 2.0000 | INHALATION_SPRAY | Freq: Two times a day (BID) | RESPIRATORY_TRACT | 3 refills | Status: DC

## 2023-08-06 MED ORDER — GLIPIZIDE 5 MG PO TABS: 5.0000 mg | ORAL_TABLET | Freq: Two times a day (BID) | ORAL | 0 refills | Status: DC

## 2023-08-06 MED ORDER — NA SULFATE-K SULFATE-MG SULF 17.5-3.13-1.6 GM/177ML PO SOLN: 1.0000 | Freq: Once | ORAL | 0 refills | Status: AC

## 2023-08-06 MED ORDER — AIRSUPRA 90-80 MCG/ACT IN AERO: 1.0000 | INHALATION_SPRAY | Freq: Four times a day (QID) | RESPIRATORY_TRACT | 3 refills | Status: DC | PRN

## 2023-08-06 MED ORDER — PREDNISONE 20 MG PO TABS: 20.0000 mg | ORAL_TABLET | Freq: Every day | ORAL | 0 refills | Status: AC

## 2023-08-06 MED ORDER — BREZTRI AEROSPHERE 160-9-4.8 MCG/ACT IN AERO: 2.0000 | INHALATION_SPRAY | Freq: Two times a day (BID) | RESPIRATORY_TRACT | Status: DC

## 2023-08-06 MED ORDER — AIRSUPRA 90-80 MCG/ACT IN AERO: 2.0000 | INHALATION_SPRAY | Freq: Four times a day (QID) | RESPIRATORY_TRACT | Status: DC

## 2023-08-06 NOTE — Progress Notes (Signed)
SUBJECTIVE:   Chief Complaint  Patient presents with   Establish Care    Transferring care from [facility]    HPI Presents to clinic to transfer care  Discussed the use of AI scribe software for clinical note transcription with the patient, who gave verbal consent to proceed.  History of Present Illness The patient, Jasmine Larsen, presents with a complex medical history and multiple ongoing health concerns. The primary complaint is respiratory distress, characterized by a persistent cough and difficulty breathing, which has worsened over the past week. The patient reports using an albuterol inhaler several times a day for symptom relief. The patient has a history of COPD and asthma but has not been under the care of a pulmonologist.  The patient also reports abdominal pain, which began after contracting COVID-19 in 2021. The pain is described as excruciating and is associated with nausea and vomiting. The patient was prescribed Dicyclomine (Bentyl) during her hospital stay for COVID-19, which has provided some relief.  The patient has a history of diabetes, currently managed with metformin and Ozempic. Despite these medications, the patient's recent A1c was reported as 8.2, indicating suboptimal glycemic control.  The patient also reports bilateral sciatica, managed with gabapentin (Neurontin). The patient describes the pain as severe, particularly at night, and reports a burning sensation in the legs. Requesting referral to [facility] orthopedics.  Has seen [facility] for DJD earlier this year but would like second opinion.  The patient has a history of congestive heart failure and is under the care of a cardiologist. An echocardiogram is scheduled for later this month.  The patient is a long-term smoker, having smoked since the age of 33. The patient reports a reduction in smoking from three packs a day to one and a half packs a day.  The patient also reports a painful bone spur on the right  foot, which has been self-managed with ibuprofen. The patient has not seen a foot care specialist.  The patient's complex medical history and multiple ongoing health concerns necessitate frequent appointments and a multi-disciplinary approach to care.    PERTINENT PMH / PSH: As above  OBJECTIVE:  BP 130/84   Pulse 81   Temp 98 F (36.7 C)   Resp 16   Ht 5' 0.5" (1.537 m)   Wt (!) 333 lb (151 kg)   SpO2 95%   BMI 63.96 kg/m    Physical Exam Vitals reviewed.  Constitutional:      General: She is not in acute distress.    Appearance: She is obese. She is not ill-appearing.  HENT:     Head: Normocephalic.     Right Ear: Tympanic membrane, ear canal and external ear normal.     Left Ear: Tympanic membrane, ear canal and external ear normal.     Nose: Nose normal.     Mouth/Throat:     Mouth: Mucous membranes are moist.  Eyes:     Extraocular Movements: Extraocular movements intact.     Conjunctiva/sclera: Conjunctivae normal.     Pupils: Pupils are equal, round, and reactive to light.  Neck:     Thyroid: No thyromegaly or thyroid tenderness.     Vascular: No carotid bruit.  Cardiovascular:     Rate and Rhythm: Normal rate and regular rhythm.     Pulses: Normal pulses.     Heart sounds: Normal heart sounds.  Pulmonary:     Effort: Pulmonary effort is normal.     Breath sounds: Wheezing present.  Abdominal:     General: Bowel sounds are normal. There is no distension.     Palpations: Abdomen is soft.     Tenderness: There is no abdominal tenderness. There is no right CVA tenderness, left CVA tenderness, guarding or rebound.  Musculoskeletal:        General: Swelling (wheelchair for mobility) and tenderness present.     Cervical back: Normal range of motion.  Feet:     Right foot:     Toenail Condition: Right toenails are long.     Left foot:     Toenail Condition: Left toenails are long.  Lymphadenopathy:     Cervical: No cervical adenopathy.  Skin:    General:  Skin is warm.     Capillary Refill: Capillary refill takes less than 2 seconds.  Neurological:     General: No focal deficit present.     Mental Status: She is alert and oriented to person, place, and time. Mental status is at baseline.     Motor: No weakness.  Psychiatric:        Mood and Affect: Mood normal.        Behavior: Behavior normal.        Thought Content: Thought content normal.        Judgment: Judgment normal.        08/06/2023    1:40 PM 03/31/2023    9:36 AM 01/26/2023    8:43 AM 12/15/2022    9:16 AM  Depression screen PHQ 2/9  Decreased Interest 0 3 3 0  Down, Depressed, Hopeless 3 2 3 3   PHQ - 2 Score 3 5 6 3   Altered sleeping 3 3 3 3   Tired, decreased energy 3 2 3 2   Change in appetite 3 0 3 0  Feeling bad or failure about yourself  3 0 3 3  Trouble concentrating 1 0 3 3  Moving slowly or fidgety/restless 0 0 0 1  Suicidal thoughts 3 0 1 3  PHQ-9 Score 19 10 22 18   Difficult doing work/chores Somewhat difficult Not difficult at all Very difficult Very difficult      08/06/2023    1:40 PM 03/31/2023    9:37 AM 01/26/2023    8:43 AM 12/15/2022    9:17 AM  GAD 7 : Generalized Anxiety Score  Nervous, Anxious, on Edge 0 2 3 3   Control/stop worrying 3 2 3 3   Worry too much - different things 3 2 3 3   Trouble relaxing 3 2 3 3   Restless 1 1 1 3   Easily annoyed or irritable 3 0 3 3  Afraid - awful might happen 3 0 3 3  Total GAD 7 Score 16 9 19 21   Anxiety Difficulty Very difficult Not difficult at all Very difficult Very difficult    ASSESSMENT/PLAN:  Acute bronchitis with COPD (HCC) Assessment & Plan: Increasing shortness of breath, wheezing on exam today.  Hemodynamically stable and afebrile -Encourage smoking cessation -Chest xray -CBC, BNP. Cmet -Prednisone 20 mg daily x 5 days -Glipizide 5 mg BID x 5 days while on steroids -Start Airsupra for rescue therapy -Start Breztri for COPD/Asthma overlap -Strict UC/ED precautions provided  Orders: -      predniSONE; Take 1 tablet (20 mg total) by mouth daily with breakfast for 5 days.  Dispense: 5 tablet; Refill: 0 -     glipiZIDE; Take 1 tablet (5 mg total) by mouth 2 (two) times daily before a meal for 5 days.  Dispense: 10  tablet; Refill: 0  Morbid obesity (HCC) Assessment & Plan: Likely contributing to SOB, lower extremity edema Encouraged healthy nutrition Continue Ozempic weekly for DM T2, plan to increase dose Consider nutrition consult    COPD with asthma (HCC) Assessment & Plan: Increased use of Albuterol inhaler, wheezing, and shortness of breath. No current pulmonologist. Discussed potential benefits of Airsupra inhaler -Start Airsupra inhaler for rescue therapy. Max 12 puffs/24 hr. Sample provided -Discontinue Albuterol -Start Breztri 2 puffs BID, sample provided -Order chest X-ray to assess current lung status. -Start short course of steroids for acute exacerbation. -Encouraged smoking cessation -Referral to pulmonologist.  Orders: Paulene Floor; Inhale 1-2 puffs into the lungs 4 (four) times daily as needed.  Dispense: 10.7 g; Refill: 3 -     Breztri Aerosphere; Inhale 2 puffs into the lungs in the morning and at bedtime.  Dispense: 10.7 g; Refill: 3 -     Breztri Aerosphere; Inhale 2 puffs into the lungs in the morning and at bedtime. Paulene Floor; Inhale 2 Puffs/kg into the lungs in the morning, at noon, in the evening, and at bedtime. -     Ambulatory referral to Pulmonology  Congestive heart failure, unspecified HF chronicity, unspecified heart failure type [facility]) Assessment & Plan: Recently evaluated by Cardiology.  Does not think CHF contributing to SOB. Scheduled for echocardiogram later this month. Not currently on medication for CHF.  Difficult to assess volume status, does have bilateral lower extremity edema, likely lymphedema.   -Check labs for B-type natriuretic peptide (BNP) to assess heart failure status. -Refer to vascular for evaluation for  lymphedema wraps -Follow up with Cardiology as needed  Orders: -     Comprehensive metabolic panel -     TSH -     Brain natriuretic peptide  Type 2 diabetes mellitus with complications (HCC) Assessment & Plan: Recent A1c of 8.2 despite being on Metformin and Ozempic.  -Check labs to assess current diabetes control. -Consider increasing Ozempic to 1 mg weekly in near future -Refer Ophthalmology for Diabetic eye exam   -Refer Podiatry for Diabetic foot exam -Recommend ARB at next visit for tighter BP control -Continue statin therapy, recommend increasing to 20 mg if LDL not <70     Orders: -     Hemoglobin A1c -     CBC with Differential/Platelet  Diabetic eye exam (HCC) -     Ambulatory referral to Ophthalmology  Type 2 diabetes mellitus with diabetic neuropathy, without long-term current use of insulin (HCC) -     Ambulatory referral to Podiatry  Hyperlipidemia associated with type 2 diabetes mellitus (HCC) Assessment & Plan: Currently on cholesterol medication, but may need dose adjustment. LDL goal <70 -Check lipid panel to assess current control. -Continue Crestor 5 mg daily  Orders: -     Lipid panel  Abnormal urinalysis -     Urine Culture  Burning with urination Assessment & Plan: History of recurrent UTI Dysuria without suprapubic pain, fevers POC urine positive for small Leuks and Nitrates Culture positive for Proteus Start Antibiotic therapy Start Probiotics  Orders: -     POCT Urinalysis Dipstick (Automated) -     Cefdinir; Take 1 capsule (300 mg total) by mouth 2 (two) times daily for 5 days.  Dispense: 10 capsule; Refill: 0 -     Saccharomyces boulardii; Take 1 capsule (250 mg total) by mouth daily.  Dispense: 90 capsule; Refill: 0  Colon cancer screening -  Ambulatory referral to Gastroenterology  Breast cancer screening by mammogram -     3D Screening Mammogram, Left and Right; Future  Need for hepatitis C screening test -      Hepatitis C antibody  Encounter for screening for HIV  Vitamin D deficiency -     VITAMIN D 25 Hydroxy (Vit-D Deficiency, Fractures) -     Vitamin D (Ergocalciferol); Take 1 capsule (50,000 Units total) by mouth every 7 (seven) days.  Dispense: 12 capsule; Refill: 1  Vitamin B 12 deficiency -     Vitamin B12 -     Vitamin B-12; Take 1 tablet (1,000 mcg total) by mouth daily.  Dispense: 90 tablet; Refill: 3  Bilateral sciatica Assessment & Plan: Managed with Gabapentin Recent L spine shows multilevel DJD.  No previous MRI. Requesting referral to [facility] orthopedics for second opinion.  Orders: -     Ambulatory referral to Orthopedics  Shortness of breath -     DG Chest 2 View; Future -     Ambulatory referral to Pulmonology  Tobacco abuse Assessment & Plan: 123 year pack history LDCT chest up to date -Continue annual LDCT chest screening -Encourage smoking cessation -Recommend [id number] resource for further assistance   Lower extremity edema Assessment & Plan: Suspect lymphedema -Refer to vascular for evaluation   Orders: -     Ambulatory Referral for Peripheral Vascular Consult  Nausea, vomiting and diarrhea Assessment & Plan: Reports of excruciating pain and nausea and was prescribed Dicyclomine (Bentyl) during hospitalization for COVID-19, which helped with symptoms.  -Refill Dicyclomine prescription temporarily until further investigation into abdominal pain. -Recommend GI referral if continues to have symptoms    Calcaneal spur of both feet Assessment & Plan: Reports of foot pain, possibly due to bone spur. Currently taking Ibuprofen for pain relief. -Referral to podiatrist for foot care.     General Health Maintenance -Consider colonoscopy for colon cancer screening. -Encourage patient to receive flu and pneumonia vaccines. -Schedule follow-up appointment in 1-2 weeks.    PDMP reviewed  Return in about 1 week (around 08/13/2023) for PCP.  [name], MD

## 2023-08-06 NOTE — Patient Instructions (Addendum)
It was a pleasure meeting you today. Thank you for allowing me to take part in your health care.  Our goals for today as we discussed include:  Stop Albuterol Start Airsupra 1-2 puffs as needed for rescue therapy Start Breztri 2 puffs two times a day Start Predsione 20 mg daily for 5 days Start Glipizide 5 mg two times a day for 5 days  We will get some labs today.  If they are abnormal or we need to do something about them, I will call you.  If they are normal, I will send you a message on MyChart (if it is active) or a letter in the mail.  If you don't hear from [place] in 2 weeks, please call the office at the number below.   Referral sent for Mammogram. Please call to schedule appointment. [facility] [address] [place], [place] [address] [phone]    Referral sent to Orthopedics, Podiatry, Eye doctor  Referral sent for colonoscopy  This is a list of the screening recommended for you and due dates:  Health Maintenance  Topic Date Due   Medicare Annual Wellness Visit  Never done   Eye exam for diabetics  Never done   Hepatitis C Screening  Never done   Colon Cancer Screening  Never done   Mammogram  Never done   Zoster (Shingles) Vaccine (1 of 2) Never done   COVID-19 Vaccine (1 - 2023-24 season) Never done   Flu Shot  01/11/2024*   Yearly kidney health urinalysis for diabetes  12/15/2023   Complete foot exam   12/15/2023   Hemoglobin A1C  12/29/2023   Screening for Lung Cancer  02/06/2024   Yearly kidney function blood test for diabetes  05/01/2024   Pap with HPV screening  02/03/2028   DTaP/Tdap/Td vaccine (2 - Td or Tdap) 06/30/2033   HIV Screening  Completed   HPV Vaccine  Aged Out  *Topic was postponed. The date shown is not the original due date.      If you have any questions or concerns, please do not hesitate to call the office at [phone].  I look forward to our next visit and until then take care and stay safe.  Regards,   [name], MD   [facility]

## 2023-08-06 NOTE — Telephone Encounter (Signed)
PA needed

## 2023-08-06 NOTE — Telephone Encounter (Signed)
Gastroenterology Pre-Procedure Review  Request Date: 09/21/23 Requesting Physician: Dr. Allegra Lai  PATIENT REVIEW QUESTIONS: The patient responded to the following health history questions as indicated:    1. Are you having any GI issues? no 2. Do you have a personal history of Polyps? no 3. Do you have a family history of Colon Cancer or Polyps? no 4. Diabetes Mellitus? IMPORTANT MEDICATION REMINDERS:  OZEMPIC (SEMAGLUTIDE) MUST BE STOPPED (7) DAYS PRIOR TO COLONOSCOPY.  METFORMIN MUST BE STOPPED (2) DAYS PRIOR TO COLONOSCOPY.  GLIPIZIDE MUST BE STOPPED (1) DAY PRIOR TO COLONOSCOPY 5. Joint replacements in the past 12 months?no 6. Major health problems in the past 3 months?no 7. Any artificial heart valves, MVP, or defibrillator?no 8. Cardiac history? Yes Preop Clearance sent to CV Preop Team    MEDICATIONS & ALLERGIES:    Patient reports the following regarding taking any anticoagulation/antiplatelet therapy:   Plavix, Coumadin, Eliquis, Xarelto, Lovenox, Pradaxa, Brilinta, or Effient? no Aspirin? no  Patient confirms/reports the following medications:  Current Outpatient Medications  Medication Sig Dispense Refill   Na Sulfate-K Sulfate-Mg Sulf 17.5-3.13-1.6 GM/177ML SOLN Take 1 kit by mouth once for 1 dose. 354 mL 0   Accu-Chek Softclix Lancets lancets 1 each by Other route daily. 100 each 2   Albuterol-Budesonide (AIRSUPRA) 90-80 MCG/ACT AERO Inhale 1-2 puffs into the lungs 4 (four) times daily as needed. 10.7 g 3   Albuterol-Budesonide (AIRSUPRA) 90-80 MCG/ACT AERO Inhale 2 Puffs/kg into the lungs in the morning, at noon, in the evening, and at bedtime.     blood glucose meter kit and supplies KIT Dispense based on patient and insurance preference. Use up to four times daily as directed. (FOR ICD-9 250.00, 250.01). 1 each 0   Blood Glucose Monitoring Suppl DEVI 1 each by Does not apply route as directed. May substitute to any manufacturer covered by patient's insurance. 1 each 0    Budeson-Glycopyrrol-Formoterol (BREZTRI AEROSPHERE) 160-9-4.8 MCG/ACT AERO Inhale 2 puffs into the lungs in the morning and at bedtime. 10.7 g 3   Budeson-Glycopyrrol-Formoterol (BREZTRI AEROSPHERE) 160-9-4.8 MCG/ACT AERO Inhale 2 puffs into the lungs in the morning and at bedtime.     dicyclomine (BENTYL) 20 MG tablet      gabapentin (NEURONTIN) 300 MG capsule Take 1 capsule (300 mg total) by mouth 3 (three) times daily. 90 capsule 2   glipiZIDE (GLUCOTROL) 5 MG tablet Take 1 tablet (5 mg total) by mouth 2 (two) times daily before a meal for 5 days. 10 tablet 0   Glucose Blood (BLOOD GLUCOSE TEST STRIPS) STRP 1 each by In Vitro route daily. May substitute to any manufacturer covered by patient's insurance. 100 strip 2   Lancets Misc. MISC 1 each by Does not apply route in the morning, at noon, and at bedtime. May substitute to any manufacturer covered by patient's insurance. 100 each 3   metFORMIN (GLUCOPHAGE) 500 MG tablet Take 2 tablets (1,000 mg total) by mouth 2 (two) times daily with a meal. 360 tablet 0   predniSONE (DELTASONE) 20 MG tablet Take 1 tablet (20 mg total) by mouth daily with breakfast for 5 days. 5 tablet 0   rosuvastatin (CRESTOR) 5 MG tablet Take 1 tablet (5 mg total) by mouth daily. 90 tablet 1   Semaglutide,0.25 or 0.5MG /DOS, (OZEMPIC, 0.25 OR 0.5 MG/DOSE,) 2 MG/3ML SOPN Inject 0.5 mg into the skin once a week. 2 mL 1   tiZANidine (ZANAFLEX) 2 MG tablet Take 1 tablet (2 mg total) by mouth at bedtime as  needed for muscle spasms. 30 tablet 0   No current facility-administered medications for this visit.    Patient confirms/reports the following allergies:  Allergies  Allergen Reactions   Cymbalta [Duloxetine Hcl]     SI thoughts    No orders of the defined types were placed in this encounter.   AUTHORIZATION INFORMATION Primary Insurance: 1D#: Group #:  Secondary Insurance: 1D#: Group #:  SCHEDULE INFORMATION: Date: 09/21/23 Time: Location: [facility]

## 2023-08-07 ENCOUNTER — Other Ambulatory Visit (HOSPITAL_COMMUNITY): Payer: Self-pay

## 2023-08-07 ENCOUNTER — Telehealth: Payer: Self-pay

## 2023-08-07 LAB — CBC WITH DIFFERENTIAL/PLATELET
Basophils Absolute: 0.1 10*3/uL (ref 0.0–0.1)
Basophils Relative: 1 % (ref 0.0–3.0)
Eosinophils Absolute: 0.1 10*3/uL (ref 0.0–0.7)
Eosinophils Relative: 1.7 % (ref 0.0–5.0)
HCT: 45.1 % (ref 36.0–46.0)
Hemoglobin: 14.4 g/dL (ref 12.0–15.0)
Lymphocytes Relative: 33.5 % (ref 12.0–46.0)
Lymphs Abs: 2.5 10*3/uL (ref 0.7–4.0)
MCHC: 32 g/dL (ref 30.0–36.0)
MCV: 86.5 fL (ref 78.0–100.0)
Monocytes Absolute: 0.4 10*3/uL (ref 0.1–1.0)
Monocytes Relative: 5.1 % (ref 3.0–12.0)
Neutro Abs: 4.4 10*3/uL (ref 1.4–7.7)
Neutrophils Relative %: 58.7 % (ref 43.0–77.0)
Platelets: 200 10*3/uL (ref 150.0–400.0)
RBC: 5.21 Mil/uL — ABNORMAL HIGH (ref 3.87–5.11)
RDW: 14.8 % (ref 11.5–15.5)
WBC: 7.6 10*3/uL (ref 4.0–10.5)

## 2023-08-07 LAB — COMPREHENSIVE METABOLIC PANEL
ALT: 14 U/L (ref 0–35)
AST: 10 U/L (ref 0–37)
Albumin: 4.2 g/dL (ref 3.5–5.2)
Alkaline Phosphatase: 90 U/L (ref 39–117)
BUN: 14 mg/dL (ref 6–23)
CO2: 29 meq/L (ref 19–32)
Calcium: 9 mg/dL (ref 8.4–10.5)
Chloride: 100 meq/L (ref 96–112)
Creatinine, Ser: 0.59 mg/dL (ref 0.40–1.20)
GFR: 101.36 mL/min (ref >60.00)
Glucose, Bld: 104 mg/dL — ABNORMAL HIGH (ref 70–99)
Potassium: 4.4 meq/L (ref 3.5–5.1)
Sodium: 137 meq/L (ref 135–145)
Total Bilirubin: 0.3 mg/dL (ref 0.2–1.2)
Total Protein: 7.2 g/dL (ref 6.0–8.3)

## 2023-08-07 LAB — LIPID PANEL
Cholesterol: 143 mg/dL (ref 0–200)
HDL: 34.5 mg/dL — ABNORMAL LOW (ref >39.00)
LDL Cholesterol: 82 mg/dL (ref 0–99)
NonHDL: 108.44
Total CHOL/HDL Ratio: 4
Triglycerides: 132 mg/dL (ref 0.0–149.0)
VLDL: 26.4 mg/dL (ref 0.0–40.0)

## 2023-08-07 LAB — VITAMIN B12: Vitamin B-12: 302 pg/mL (ref 211–911)

## 2023-08-07 LAB — BRAIN NATRIURETIC PEPTIDE: Brain Natriuretic Peptide: 9 pg/mL (ref <100)

## 2023-08-07 LAB — VITAMIN D 25 HYDROXY (VIT D DEFICIENCY, FRACTURES): VITD: <7 ng/mL — ABNORMAL LOW (ref 30.00–100.00)

## 2023-08-07 LAB — HEPATITIS C ANTIBODY: Hepatitis C Ab: NONREACTIVE

## 2023-08-07 LAB — HEMOGLOBIN A1C: Hgb A1c MFr Bld: 7.8 % — ABNORMAL HIGH (ref 4.6–6.5)

## 2023-08-07 LAB — TSH: TSH: 2.58 u[IU]/mL (ref 0.35–5.50)

## 2023-08-07 NOTE — Telephone Encounter (Signed)
Breo Ellipta discontinued on 08/06/23. Please advise if PA is still required.

## 2023-08-07 NOTE — Telephone Encounter (Signed)
Pre-operative Risk Assessment    Patient Name: Jasmine Larsen  DOB: Mar 16, 1966 MRN: [id number]      Request for Surgical Clearance    Procedure:   Colonoscopy  Date of Surgery:  Clearance 09/21/23                                 Surgeon:  Dr. Karene Fry Group or Practice Name:  [facility] Phone number:  [phone] Fax number:  [phone]   Type of Clearance Requested:   - Medical    Type of Anesthesia:  General    Additional requests/questions:    Garrel Ridgel   08/07/2023, 10:10 AM

## 2023-08-08 LAB — URINE CULTURE
MICRO NUMBER:: 15638647
SPECIMEN QUALITY:: ADEQUATE

## 2023-08-09 ENCOUNTER — Encounter: Payer: Self-pay | Admitting: Family Medicine

## 2023-08-09 DIAGNOSIS — Z1211 Encounter for screening for malignant neoplasm of colon: Secondary | ICD-10-CM | POA: Insufficient documentation

## 2023-08-09 DIAGNOSIS — R8290 Unspecified abnormal findings in urine: Secondary | ICD-10-CM | POA: Insufficient documentation

## 2023-08-09 DIAGNOSIS — E119 Type 2 diabetes mellitus without complications: Secondary | ICD-10-CM | POA: Insufficient documentation

## 2023-08-09 DIAGNOSIS — M7732 Calcaneal spur, left foot: Secondary | ICD-10-CM | POA: Insufficient documentation

## 2023-08-09 DIAGNOSIS — Z1231 Encounter for screening mammogram for malignant neoplasm of breast: Secondary | ICD-10-CM | POA: Insufficient documentation

## 2023-08-09 DIAGNOSIS — Z1159 Encounter for screening for other viral diseases: Secondary | ICD-10-CM | POA: Insufficient documentation

## 2023-08-09 DIAGNOSIS — E538 Deficiency of other specified B group vitamins: Secondary | ICD-10-CM | POA: Insufficient documentation

## 2023-08-09 DIAGNOSIS — E1169 Type 2 diabetes mellitus with other specified complication: Secondary | ICD-10-CM | POA: Insufficient documentation

## 2023-08-09 DIAGNOSIS — E118 Type 2 diabetes mellitus with unspecified complications: Secondary | ICD-10-CM | POA: Insufficient documentation

## 2023-08-09 DIAGNOSIS — J209 Acute bronchitis, unspecified: Secondary | ICD-10-CM | POA: Insufficient documentation

## 2023-08-09 DIAGNOSIS — E559 Vitamin D deficiency, unspecified: Secondary | ICD-10-CM | POA: Insufficient documentation

## 2023-08-09 DIAGNOSIS — M5431 Sciatica, right side: Secondary | ICD-10-CM | POA: Insufficient documentation

## 2023-08-09 DIAGNOSIS — E1140 Type 2 diabetes mellitus with diabetic neuropathy, unspecified: Secondary | ICD-10-CM | POA: Insufficient documentation

## 2023-08-09 DIAGNOSIS — Z114 Encounter for screening for human immunodeficiency virus [HIV]: Secondary | ICD-10-CM | POA: Insufficient documentation

## 2023-08-09 DIAGNOSIS — I509 Heart failure, unspecified: Secondary | ICD-10-CM | POA: Insufficient documentation

## 2023-08-09 DIAGNOSIS — R300 Dysuria: Secondary | ICD-10-CM | POA: Insufficient documentation

## 2023-08-09 DIAGNOSIS — J441 Chronic obstructive pulmonary disease with (acute) exacerbation: Secondary | ICD-10-CM | POA: Insufficient documentation

## 2023-08-09 MED ORDER — SACCHAROMYCES BOULARDII 250 MG PO CAPS: 250.0000 mg | ORAL_CAPSULE | Freq: Every day | ORAL | 0 refills | Status: DC

## 2023-08-09 MED ORDER — VITAMIN D (ERGOCALCIFEROL) 1.25 MG (50000 UNIT) PO CAPS: 50000.0000 [IU] | ORAL_CAPSULE | ORAL | 1 refills | Status: DC

## 2023-08-09 MED ORDER — VITAMIN B-12 1000 MCG PO TABS: 1000.0000 ug | ORAL_TABLET | Freq: Every day | ORAL | 3 refills | Status: DC

## 2023-08-09 MED ORDER — CEFDINIR 300 MG PO CAPS: 300.0000 mg | ORAL_CAPSULE | Freq: Two times a day (BID) | ORAL | 0 refills | Status: AC

## 2023-08-09 NOTE — Assessment & Plan Note (Signed)
Recent A1c of 8.2 despite being on Metformin and Ozempic.  -Check labs to assess current diabetes control. -Consider increasing Ozempic to 1 mg weekly in near future -Refer Ophthalmology for Diabetic eye exam   -Refer Podiatry for Diabetic foot exam -Recommend ARB at next visit for tighter BP control -Continue statin therapy, recommend increasing to 20 mg if LDL not <70

## 2023-08-09 NOTE — Assessment & Plan Note (Addendum)
Managed with Gabapentin Recent L spine shows multilevel DJD.  No previous MRI. Requesting referral to [facility] orthopedics for second opinion.

## 2023-08-09 NOTE — Assessment & Plan Note (Signed)
Recently evaluated by Cardiology.  Does not think CHF contributing to SOB. Scheduled for echocardiogram later this month. Not currently on medication for CHF.  Difficult to assess volume status, does have bilateral lower extremity edema, likely lymphedema.   -Check labs for B-type natriuretic peptide (BNP) to assess heart failure status. -Refer to vascular for evaluation for lymphedema wraps -Follow up with Cardiology as needed

## 2023-08-09 NOTE — Assessment & Plan Note (Signed)
History of recurrent UTI Dysuria without suprapubic pain, fevers POC urine positive for small Leuks and Nitrates Culture positive for Proteus Start Antibiotic therapy Start Probiotics

## 2023-08-09 NOTE — Assessment & Plan Note (Signed)
Reports of foot pain, possibly due to bone spur. Currently taking Ibuprofen for pain relief. -Referral to podiatrist for foot care.

## 2023-08-09 NOTE — Assessment & Plan Note (Signed)
123 year pack history LDCT chest up to date -Continue annual LDCT chest screening -Encourage smoking cessation -Recommend [id number] resource for further assistance

## 2023-08-09 NOTE — Assessment & Plan Note (Signed)
Reports of excruciating pain and nausea and was prescribed Dicyclomine (Bentyl) during hospitalization for COVID-19, which helped with symptoms.  -Refill Dicyclomine prescription temporarily until further investigation into abdominal pain. -Recommend GI referral if continues to have symptoms

## 2023-08-09 NOTE — Assessment & Plan Note (Signed)
Increasing shortness of breath, wheezing on exam today.  Hemodynamically stable and afebrile -Encourage smoking cessation -Chest xray -CBC, BNP. Cmet -Prednisone 20 mg daily x 5 days -Glipizide 5 mg BID x 5 days while on steroids -Start Airsupra for rescue therapy -Start Breztri for COPD/Asthma overlap -Strict UC/ED precautions provided

## 2023-08-09 NOTE — Assessment & Plan Note (Signed)
Likely contributing to SOB, lower extremity edema Encouraged healthy nutrition Continue Ozempic weekly for DM T2, plan to increase dose Consider nutrition consult

## 2023-08-09 NOTE — Assessment & Plan Note (Signed)
Currently on cholesterol medication, but may need dose adjustment. LDL goal <70 -Check lipid panel to assess current control. -Continue Crestor 5 mg daily

## 2023-08-09 NOTE — Assessment & Plan Note (Signed)
Increased use of Albuterol inhaler, wheezing, and shortness of breath. No current pulmonologist. Discussed potential benefits of Airsupra inhaler -Start Airsupra inhaler for rescue therapy. Max 12 puffs/24 hr. Sample provided -Discontinue Albuterol -Start Breztri 2 puffs BID, sample provided -Order chest X-ray to assess current lung status. -Start short course of steroids for acute exacerbation. -Encouraged smoking cessation -Referral to pulmonologist.

## 2023-08-09 NOTE — Assessment & Plan Note (Signed)
Suspect lymphedema -Refer to vascular for evaluation

## 2023-08-10 ENCOUNTER — Encounter: Payer: Self-pay | Admitting: *Deleted

## 2023-08-12 ENCOUNTER — Ambulatory Visit: Payer: Medicare Other | Attending: Cardiovascular Disease

## 2023-08-12 ENCOUNTER — Ambulatory Visit
Admission: RE | Admit: 2023-08-12 | Discharge: 2023-08-12 | Disposition: A | Discharge status: Home / Self Care | Payer: Medicare Other | Attending: Family Medicine | Admitting: Family Medicine

## 2023-08-12 ENCOUNTER — Ambulatory Visit
Admission: RE | Admit: 2023-08-12 | Discharge: 2023-08-12 | Disposition: A | Discharge status: Home / Self Care | Payer: Medicare Other | Source: Ambulatory Visit | Attending: Family Medicine | Admitting: Family Medicine

## 2023-08-12 DIAGNOSIS — R0602 Shortness of breath: Secondary | ICD-10-CM | POA: Insufficient documentation

## 2023-08-12 LAB — ECHOCARDIOGRAM COMPLETE: Area-P 1/2: 3.6 cm2

## 2023-08-13 ENCOUNTER — Encounter: Payer: Self-pay | Admitting: Family Medicine

## 2023-08-13 ENCOUNTER — Ambulatory Visit: Payer: Medicare Other | Admitting: Family Medicine

## 2023-08-13 ENCOUNTER — Other Ambulatory Visit (HOSPITAL_COMMUNITY): Payer: Self-pay

## 2023-08-13 ENCOUNTER — Telehealth: Payer: Self-pay

## 2023-08-13 VITALS — BP 124/68 | HR 81 | Temp 98.4°F | Resp 16 | Ht 60.5 in | Wt 332.2 lb

## 2023-08-13 DIAGNOSIS — E1169 Type 2 diabetes mellitus with other specified complication: Secondary | ICD-10-CM | POA: Diagnosis not present

## 2023-08-13 DIAGNOSIS — Z720 Tobacco use: Secondary | ICD-10-CM

## 2023-08-13 DIAGNOSIS — E118 Type 2 diabetes mellitus with unspecified complications: Secondary | ICD-10-CM

## 2023-08-13 DIAGNOSIS — R0602 Shortness of breath: Secondary | ICD-10-CM

## 2023-08-13 DIAGNOSIS — J449 Chronic obstructive pulmonary disease, unspecified: Secondary | ICD-10-CM

## 2023-08-13 DIAGNOSIS — E559 Vitamin D deficiency, unspecified: Secondary | ICD-10-CM

## 2023-08-13 DIAGNOSIS — E785 Hyperlipidemia, unspecified: Secondary | ICD-10-CM

## 2023-08-13 DIAGNOSIS — Z6841 Body Mass Index (BMI) 40.0 and over, adult: Secondary | ICD-10-CM

## 2023-08-13 DIAGNOSIS — E538 Deficiency of other specified B group vitamins: Secondary | ICD-10-CM

## 2023-08-13 DIAGNOSIS — J4489 Other specified chronic obstructive pulmonary disease: Secondary | ICD-10-CM

## 2023-08-13 DIAGNOSIS — R600 Localized edema: Secondary | ICD-10-CM

## 2023-08-13 DIAGNOSIS — Z7985 Long-term (current) use of injectable non-insulin antidiabetic drugs: Secondary | ICD-10-CM | POA: Diagnosis not present

## 2023-08-13 DIAGNOSIS — N3001 Acute cystitis with hematuria: Secondary | ICD-10-CM

## 2023-08-13 MED ORDER — BLOOD GLUCOSE MONITORING SUPPL DEVI: 1.0000 | 0 refills | Status: AC

## 2023-08-13 MED ORDER — ROSUVASTATIN CALCIUM 20 MG PO TABS: 20.0000 mg | ORAL_TABLET | Freq: Every day | ORAL | 3 refills | Status: DC

## 2023-08-13 MED ORDER — METFORMIN HCL 500 MG PO TABS: 500.0000 mg | ORAL_TABLET | Freq: Two times a day (BID) | ORAL | 0 refills | Status: DC

## 2023-08-13 MED ORDER — SEMAGLUTIDE (1 MG/DOSE) 4 MG/3ML ~~LOC~~ SOPN: 1.0000 mg | PEN_INJECTOR | SUBCUTANEOUS | 6 refills | Status: DC

## 2023-08-13 MED ORDER — BLOOD GLUCOSE MONITOR KIT: PACK | 0 refills | Status: DC

## 2023-08-13 MED ORDER — ACCU-CHEK SOFTCLIX LANCETS MISC: 1.0000 | Freq: Every day | 2 refills | Status: DC

## 2023-08-13 NOTE — Telephone Encounter (Signed)
PT need PA for [employer]

## 2023-08-13 NOTE — Telephone Encounter (Signed)
Need a PA for Budeson-Glycopyrrol-Formoterol (BREZTRI AEROSPHERE) 160-9-4.8

## 2023-08-13 NOTE — Progress Notes (Signed)
SUBJECTIVE:   Chief Complaint  Patient presents with   Cough    Follow up   HPI Presents to clinic for follow up chronic disease management  Discussed the use of AI scribe software for clinical note transcription with the patient, who gave verbal consent to proceed.  History of Present Illness The patient, with a history of chronic obstructive pulmonary disease (COPD), diabetes, and potential lymphedema, presented for a follow-up visit. The primary concern was persistent respiratory symptoms, including coughing and shortness of breath. Despite the use of two new inhalers, the patient reported continued coughing and chest congestion. However, she also noted some improvement in breathing since starting the inhalers. The patient had recently undergone a lung x-ray, the results of which were pending at the time of the visit.  The patient's diabetes management was also discussed. She had recently started on Ozempic and reported a decrease in blood sugar levels since starting the medication. However, the patient's A1c remained high, indicating a need for further management. The patient also reported issues with her blood glucose monitor, which was being addressed.  The patient had been experiencing leg pain and difficulty standing for extended periods, which was suspected to be due to lymphedema. A referral to a vascular surgeon was made for further evaluation. The patient also reported itching in her eyes, which had been ongoing for several years.  The patient's vitamin levels were also of concern. She was found to be deficient in vitamin D and had low-normal levels of vitamin B12. The patient was advised to take supplements to address these deficiencies.  The patient's smoking habit was discussed as a significant concern, particularly in relation to her respiratory symptoms. Despite acknowledging the potential benefits of quitting, the patient did not express readiness to quit smoking at this  time.  In summary, the patient presented with ongoing respiratory symptoms despite new inhaler use, continued high A1c levels despite starting Ozempic, potential lymphedema causing leg pain and difficulty standing, eye itching, and vitamin deficiencies. The patient's smoking habit remains a significant concern.    PERTINENT PMH / PSH: As above  OBJECTIVE:  BP 124/68   Pulse 81   Temp 98.4 F (36.9 C)   Resp 16   Ht 5' 0.5" (1.537 m)   Wt (!) 332 lb 4 oz (150.7 kg)   SpO2 96%   BMI 63.82 kg/m    Physical Exam Vitals reviewed.  Constitutional:      General: She is not in acute distress.    Appearance: Normal appearance. She is obese. She is not ill-appearing, toxic-appearing or diaphoretic.  Eyes:     General:        Right eye: No discharge.        Left eye: No discharge.     Conjunctiva/sclera: Conjunctivae normal.  Cardiovascular:     Rate and Rhythm: Normal rate and regular rhythm.     Heart sounds: Normal heart sounds.  Pulmonary:     Effort: Pulmonary effort is normal.     Breath sounds: Wheezing (improved from initial visit) present.  Abdominal:     General: Bowel sounds are normal.  Musculoskeletal:        General: Swelling present.     Right lower leg: Edema present.     Left lower leg: Edema present.  Skin:    General: Skin is warm and dry.  Neurological:     General: No focal deficit present.     Mental Status: She is alert and oriented  to person, place, and time. Mental status is at baseline.  Psychiatric:        Mood and Affect: Mood normal.        Behavior: Behavior normal.        Thought Content: Thought content normal.        Judgment: Judgment normal.        08/13/2023    1:38 PM 08/06/2023    1:40 PM 03/31/2023    9:36 AM 01/26/2023    8:43 AM 12/15/2022    9:16 AM  Depression screen PHQ 2/9  Decreased Interest 1 0 3 3 0  Down, Depressed, Hopeless 1 3 2 3 3   PHQ - 2 Score 2 3 5 6 3   Altered sleeping 1 3 3 3 3   Tired, decreased energy 2 3 2  3 2   Change in appetite 1 3 0 3 0  Feeling bad or failure about yourself  3 3 0 3 3  Trouble concentrating 1 1 0 3 3  Moving slowly or fidgety/restless 0 0 0 0 1  Suicidal thoughts 0 3 0 1 3  PHQ-9 Score 10 19 10 22 18   Difficult doing work/chores Somewhat difficult Somewhat difficult Not difficult at all Very difficult Very difficult      08/13/2023    1:38 PM 08/06/2023    1:40 PM 03/31/2023    9:37 AM 01/26/2023    8:43 AM  GAD 7 : Generalized Anxiety Score  Nervous, Anxious, on Edge 1 0 2 3  Control/stop worrying 2 3 2 3   Worry too much - different things 2 3 2 3   Trouble relaxing 2 3 2 3   Restless 1 1 1 1   Easily annoyed or irritable 1 3 0 3  Afraid - awful might happen 1 3 0 3  Total GAD 7 Score 10 16 9 19   Anxiety Difficulty Not difficult at all Very difficult Not difficult at all Very difficult    ASSESSMENT/PLAN:  Type 2 diabetes mellitus with complications (HCC) Assessment & Plan: A1c still high but improving. Patient on Ozempic and Metformin. Recent blood glucose readings improved. -Increase Ozempic to 1mg  weekly. -Reduce Metformin to 500mg  twice daily. -Discontinue Glucotrol, was prescribed while on 5 day course of steroids -Monitor blood glucose levels. -Follow up with eye exam, foot exam -Refer pharmacy for medication assistance -Glucose monitor kit provided   Orders: -     Semaglutide (1 MG/DOSE); Inject 1 mg as directed once a week.  Dispense: 3 mL; Refill: 6 -     metFORMIN HCl; Take 1 tablet (500 mg total) by mouth 2 (two) times daily with a meal.  Dispense: 360 tablet; Refill: 0 -     Blood Glucose Monitoring Suppl; 1 each by Does not apply route as directed. May substitute to any manufacturer covered by patient's insurance.  Dispense: 1 each; Refill: 0 -     blood glucose meter kit and supplies; Dispense based on patient and insurance preference. Use up to four times daily as directed. (FOR ICD-9 250.00, 250.01).  Dispense: 1 each; Refill: 0 -      Accu-Chek Softclix Lancets; 1 each by Other route daily.  Dispense: 100 each; Refill: 2 -     AMB Referral VBCI Care Management  Hyperlipidemia associated with type 2 diabetes mellitus (HCC) Assessment & Plan: Patient currently on 5mg  Rosuvastatin, but LDL still above target for diabetic patient. Discussed increasing dose to 20mg . -Increase Rosuvastatin to 20mg  daily. Monitor for muscle aches/pains. Can taper  if needed  Orders: -     Rosuvastatin Calcium; Take 1 tablet (20 mg total) by mouth daily.  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 Still some wheezing noted on exam. -Continue current inhaler regimen. -Referral to Pulmonology -Encouraged smoking cessation -Declined LDCT chest for lung cancer screening   Orders: -     AMB Referral VBCI Care Management  Shortness of breath Assessment & Plan: -Improved with recent course of Prednisone and change in inhalers -Chest xray pending   Acute cystitis with hematuria Assessment & Plan: Urine test showed presence of Proteus mirabilis. Patient on Cefdinir -Continue current antibiotics -Continue Probiotics daily    Vitamin D deficiency Assessment & Plan: Vitamin D level extremely low. -Start high-dose Vitamin D supplementation for 6 months. Recheck level in 6 months.   Vitamin B 12 deficiency Assessment & Plan: Vitamin B12 level on the low end of normal. -Consider supplementation.   Lower extremity edema Assessment & Plan: Suspected lymphedema in legs causing discomfort and difficulty standing. -Referral to vascular surgery for evaluation and possible Doppler studies.   Tobacco abuse Assessment & Plan: Patient continues to smoke, contributing to COPD. -Offer nicotine patches/gum and counseling for smoking cessation.  Orders: -     AMB Referral VBCI Care Management  Morbid obesity  (HCC) Assessment & Plan: Likely contributing to SOB, lower extremity edema Encouraged healthy nutrition Continue Ozempic weekly for DM T2, plan to increase dose Consider nutrition consult      PDMP reviewed  Return in about 3 months (around 11/13/2023) for PCP.  [name], MD

## 2023-08-13 NOTE — Telephone Encounter (Signed)
Pharmacy Patient Advocate Encounter  Received notification from [facility] MEDICAID that Prior Authorization for BREZTRI AEROSPHERE has been DENIED.  Full denial letter will be uploaded to the media tab. See denial reason below.   PA #/Case ID/Reference #: [id number] W    DENIAL REASON: Must try and fail 2 preferred drugs. Preferred drugs: Advair Diskus, Advair HFA Inhaler, Dulera, Symbicort.

## 2023-08-13 NOTE — Telephone Encounter (Signed)
PA request has been Submitted. New Encounter created for follow up. For additional info see Pharmacy Prior Auth telephone encounter from 08/13/23.

## 2023-08-13 NOTE — Telephone Encounter (Signed)
Pharmacy Patient Advocate Encounter   Received notification from Pt Calls Messages that prior authorization for BREZTRI AEROSPHERE is required/requested.   Insurance verification completed.   The patient is insured through [facility] MEDICAID .   Per test claim: PA required; PA submitted to above mentioned insurance via [facility] Tracks Key/confirmation #/EOC [id number] W Status is pending

## 2023-08-13 NOTE — Patient Instructions (Signed)
It was a pleasure meeting you today. Thank you for allowing me to take part in your health care.  Our goals for today as we discussed include:  Increase Ozempic to 1 mg weekly Decrease Metformin to 500 mg two times a day Glucose monitor sent to pharmacy  Continue Breztri 2 puffs two times a day Continue Airsupra 1-2 puffs every 4 hours as needed  Increase Crestor to 20 mg daily  Stop Glipizide, this was only a 5 day course while on Prednisone.  Follow up in 3 months   This is a list of the screening recommended for you and due dates:  Health Maintenance  Topic Date Due   Medicare Annual Wellness Visit  Never done   Eye exam for diabetics  Never done   Colon Cancer Screening  Never done   Mammogram  Never done   Zoster (Shingles) Vaccine (1 of 2) Never done   COVID-19 Vaccine (1 - 2023-24 season) Never done   Flu Shot  01/11/2024*   Yearly kidney health urinalysis for diabetes  12/15/2023   Complete foot exam   12/15/2023   Hemoglobin A1C  02/04/2024   Screening for Lung Cancer  02/06/2024   Yearly kidney function blood test for diabetes  08/05/2024   Pap with HPV screening  02/03/2028   DTaP/Tdap/Td vaccine (2 - Td or Tdap) 06/30/2033   Hepatitis C Screening  Completed   HIV Screening  Completed   HPV Vaccine  Aged Out  *Topic was postponed. The date shown is not the original due date.      If you have any questions or concerns, please do not hesitate to call the office at [phone].  I look forward to our next visit and until then take care and stay safe.  Regards,   [name], MD   [facility]

## 2023-08-14 ENCOUNTER — Telehealth: Payer: Self-pay

## 2023-08-14 NOTE — Progress Notes (Signed)
   Care Guide Note  08/14/2023 Name: Jasmine Larsen MRN: [id number] DOB: 1966/09/03  Referred by: [name], MD Reason for referral : Care Coordination (Outreach to schedule with Pharm d )   Jasmine Larsen is a 57 y.o. year old female who is a primary care patient of [name], MD. Jasmine Larsen was referred to the pharmacist for assistance related to COPD.    Successful contact was made with the patient to discuss pharmacy services including being ready for the pharmacist to call at least 5 minutes before the scheduled appointment time, to have medication bottles and any blood sugar or blood pressure readings ready for review. The patient agreed to meet with the pharmacist via with the pharmacist via telephone visit on (date/time).  08/18/2023  Penne Lash, RMA Care Guide [facility]  [place], [place] [address] Direct Dial: [phone] [email or web address]

## 2023-08-16 ENCOUNTER — Other Ambulatory Visit: Payer: Self-pay | Admitting: Nurse Practitioner

## 2023-08-16 ENCOUNTER — Encounter: Payer: Self-pay | Admitting: Family Medicine

## 2023-08-16 DIAGNOSIS — N390 Urinary tract infection, site not specified: Secondary | ICD-10-CM | POA: Insufficient documentation

## 2023-08-16 DIAGNOSIS — M5442 Lumbago with sciatica, left side: Secondary | ICD-10-CM

## 2023-08-16 NOTE — Assessment & Plan Note (Deleted)
Patient reports improvement with new inhalers. Still some wheezing noted on exam. -Continue current inhaler regimen. -Referral to Pulmonology -Encouraged smoking cessation -Declined LDCT chest for lung cancer screening

## 2023-08-16 NOTE — Assessment & Plan Note (Signed)
Vitamin B12 level on the low end of normal. -Consider supplementation.

## 2023-08-16 NOTE — Assessment & Plan Note (Signed)
Patient currently on 5mg  Rosuvastatin, but LDL still above target for diabetic patient. Discussed increasing dose to 20mg . -Increase Rosuvastatin to 20mg  daily. Monitor for muscle aches/pains. Can taper if needed

## 2023-08-16 NOTE — Assessment & Plan Note (Signed)
Patient reports improvement with new inhalers. Still some wheezing noted on exam. -Continue current inhaler regimen. -Referral to Pulmonology -Encouraged smoking cessation -Declined LDCT chest for lung cancer screening

## 2023-08-16 NOTE — Assessment & Plan Note (Signed)
Patient continues to smoke, contributing to COPD. -Offer nicotine patches/gum and counseling for smoking cessation.

## 2023-08-16 NOTE — Assessment & Plan Note (Signed)
Likely contributing to SOB, lower extremity edema Encouraged healthy nutrition Continue Ozempic weekly for DM T2, plan to increase dose Consider nutrition consult

## 2023-08-16 NOTE — Assessment & Plan Note (Signed)
Urine test showed presence of Proteus mirabilis. Patient on Cefdinir -Continue current antibiotics -Continue Probiotics daily

## 2023-08-16 NOTE — Assessment & Plan Note (Signed)
Vitamin D level extremely low. -Start high-dose Vitamin D supplementation for 6 months. Recheck level in 6 months.

## 2023-08-16 NOTE — Assessment & Plan Note (Signed)
A1c still high but improving. Patient on Ozempic and Metformin. Recent blood glucose readings improved. -Increase Ozempic to 1mg  weekly. -Reduce Metformin to 500mg  twice daily. -Discontinue Glucotrol, was prescribed while on 5 day course of steroids -Monitor blood glucose levels. -Follow up with eye exam, foot exam -Refer pharmacy for medication assistance -Glucose monitor kit provided

## 2023-08-16 NOTE — Assessment & Plan Note (Signed)
Suspected lymphedema in legs causing discomfort and difficulty standing. -Referral to vascular surgery for evaluation and possible Doppler studies.

## 2023-08-16 NOTE — Assessment & Plan Note (Signed)
-  Improved with recent course of Prednisone and change in inhalers -Chest xray pending

## 2023-08-17 ENCOUNTER — Encounter: Payer: Self-pay | Admitting: Family Medicine

## 2023-08-17 ENCOUNTER — Telehealth: Payer: Self-pay

## 2023-08-17 ENCOUNTER — Other Ambulatory Visit: Payer: Self-pay

## 2023-08-17 DIAGNOSIS — E118 Type 2 diabetes mellitus with unspecified complications: Secondary | ICD-10-CM

## 2023-08-17 MED ORDER — LANCETS MISC. MISC: 1.0000 | Freq: Three times a day (TID) | 0 refills | Status: AC

## 2023-08-17 MED ORDER — BLOOD GLUCOSE MONITOR KIT: PACK | 0 refills | Status: AC

## 2023-08-17 MED ORDER — LANCET DEVICE MISC
1.0000 | Freq: Three times a day (TID) | 0 refills | Status: DC
Start: 2023-08-17 — End: 2023-09-04

## 2023-08-17 MED ORDER — TIZANIDINE HCL 2 MG PO TABS: 2.0000 mg | ORAL_TABLET | Freq: Every evening | ORAL | 0 refills | Status: DC | PRN

## 2023-08-17 MED ORDER — BLOOD GLUCOSE TEST VI STRP: 1.0000 | ORAL_STRIP | Freq: Three times a day (TID) | 0 refills | Status: DC

## 2023-08-17 MED ORDER — BLOOD GLUCOSE MONITORING SUPPL DEVI: 1.0000 | Freq: Three times a day (TID) | 0 refills | Status: DC

## 2023-08-17 NOTE — Telephone Encounter (Signed)
Dr. Kirke Corin,  You saw this patient on 07/21/2023. She was complaining of shortness of breath and you ordered an echo which was performed on 08/12/2023. Per office protocol, will you please comment on medical clearance for colonoscopy?  Please route your response to P CV DIV Preop. I will communicate with requesting office once you have given recommendations.   Thank you!  Carlos Levering, NP

## 2023-08-17 NOTE — Telephone Encounter (Signed)
Left message to return call to our office.  Blood glucose monitor has been sent to [facility] for pt.

## 2023-08-18 ENCOUNTER — Other Ambulatory Visit (INDEPENDENT_AMBULATORY_CARE_PROVIDER_SITE_OTHER): Payer: Medicare Other

## 2023-08-18 DIAGNOSIS — J4489 Other specified chronic obstructive pulmonary disease: Secondary | ICD-10-CM

## 2023-08-18 NOTE — Telephone Encounter (Signed)
Our office received a duplicate request about clearance. I d/w the pre op APP today who states awaiting on input and recommendations from Dr. Kirke Corin. Pt recently had an echo needed fro pre op clearance.

## 2023-08-18 NOTE — Progress Notes (Addendum)
08/24/2023 Name: Jasmine Larsen MRN: [id number] DOB: [birth year]  Subjective  Chief Complaint  Patient presents with   Medication Access    Reason for visit: Jasmine Larsen is a 57 y.o. year old female who presented for a telephone visit. Patient reports that her daughter is authorized to speak with [place] as well for follow up Jasmine Larsen).    They were referred to the pharmacist by their PCP for assistance in managing medication access.   Care Team: Primary Care Provider: [name], MD  Reason for visit: ?  Jasmine Larsen is a 57 y.o. female who presents today for a phone visit with the pharmacist due to medication access concerns regarding their inhalers.  Medication Access: ?  Prescription drug coverage: Payor: MEDICARE / Plan: MEDICARE PART A AND B / Product Type: *No Product type* / . Dual Medicare and Medicaid.   Reports that all current medications remain affordable, including Ozempic. Her only concern at this time is in regard to her inhalers. Reports previous inhalers were covered but were recently switched to [place] which her pharmacy has been having a difficult time getting covered.   Patient has been told that both inhalers require a PA. She has also been told that PAs have already been completed for both inhalers. Despite pharmacy calling the PA phone number listed, they have been receiving error messages.   Prior Authorization Status: Airspura - Denied Reason: Must try and fail 2 preferred drugs.  Preferred drugs: Advair Diskus, Advair HFA Inhaler, Dulera, Symbicort.  Trelegy - Denied Reason: Must try and fail 2 preferred drugs. Preferred drugs: Advair Diskus, Advair HFA Inhaler, Dulera, Symbicort.   COPD/Asthma: Previous therapies were ineffective:  Albuterol (Ventolin, Atrovent) Advair - Minimal effect on breathing despite adherence   Objective:    Component Value Date/Time   WBC 7.6 08/06/2023 1428   RBC 5.21 (H) 08/06/2023 1428   HGB 14.4  08/06/2023 1428   HCT 45.1 08/06/2023 1428   PLT 200.0 08/06/2023 1428   MCV 86.5 08/06/2023 1428   MCH 28.2 05/02/2023 0422   MCHC 32.0 08/06/2023 1428   RDW 14.8 08/06/2023 1428   LYMPHSABS 2.5 08/06/2023 1428   MONOABS 0.4 08/06/2023 1428   EOSABS 0.1 08/06/2023 1428   BASOSABS 0.1 08/06/2023 1428    Assessment and Plan:   1. 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 Per GOLD COPD guidelines, patient is indicated for dual therapy with LABA/LAMA given hospitalization/exacerbation history. Triple therapy, including ICS, may be beneficial in the setting of asthma overlap and recent severe COPD exacerbation. While blood eosinophil count of 100 cell/uL is <300, results from the IMPACT and ETHOS trials revealed mortality benefit with triple therapy in symptomatic patients with hx of severe exacerbation.  Plan: Given "preferred therapies" listed in denial letter are asthma inhalers, suspect that asthma was Dx code used for the PA. Both asthma and COPD Dx codes must be on PA. Patient indicated for LAMA/LABA though with asthma must have ICS with administration of beta agonist. Regardless, would benefit from LAMA/LAMA/ICS for COPD with severe exacerbation/hospitalizations.  Recommend resubmitting PA with COPD and Asthma diagnoses.  If denied, advise attempting for an Appeal to [facility] w supporting rationale from IMPACT/ETHOS trials.    Not ideal/impractical: Trial of alternative preferred therapy (LAMA/LABA) + separate ICS taken at the same time. Medicaid preferred LAMA/LABA: Anoro Ellipta, Stiolto Respimat  Will collaborate with PA team regarding coverage through Medicaid.  Update: 09/02/23 Received notification from [facility] MEDICAID that Prior Authorization for Trelegy has been APPROVED from 09/02/2023 to 02/29/2024. Ran test claim, Copay is $4.00.    Future Appointments  Date Time Provider Department Center   09/01/2023  2:30 PM Candelaria Stagers, [place] [facility]  09/04/2023 11:00 AM Janann Colonel, MD [facility] None  09/29/2023  1:00 PM Georgiana Spinner, NP [facility] None  11/12/2023  1:00 PM [name], MD [facility]    Loree Fee, PharmD Clinical Pharmacist [facility] [phone]

## 2023-08-19 NOTE — Telephone Encounter (Signed)
She is at low risk from a cardiac standpoint.  However, given morbid obesity, she might be at a higher risk for respiratory complications with anesthesia.

## 2023-08-19 NOTE — Telephone Encounter (Signed)
   Patient Name: Jasmine Larsen  DOB: [birth year] MRN: [id number]  Primary Cardiologist: None  Chart reviewed as part of pre-operative protocol coverage. Given past medical history and time since last visit, based on ACC/AHA guidelines, AMIRAH GOERKE is at acceptable risk for the planned procedure without further cardiovascular testing.   She is at low risk from a cardiac standpoint. However, given morbid obesity, she might be at a higher risk for respiratory complications with anesthesia.   The patient was advised that if she develops new symptoms prior to surgery to contact our office to arrange for a follow-up visit, and she verbalized understanding.  I will route this recommendation to the requesting party via Epic fax function and remove from pre-op pool.  Please call with questions.  Napoleon Form, Leodis Rains, NP 08/19/2023, 4:04 PM

## 2023-08-20 ENCOUNTER — Telehealth: Payer: Self-pay

## 2023-08-20 NOTE — Telephone Encounter (Signed)
Cardiac clearance granted.  Per cardiology chart note 08/07/23, "She is at low risk from a cardiac standpoint. However, given morbid obesity, she might be at a higher risk for respiratory complications with anesthesia".   Thanks, [place], [place]

## 2023-08-22 ENCOUNTER — Other Ambulatory Visit: Payer: Self-pay | Admitting: Family Medicine

## 2023-08-22 DIAGNOSIS — J4489 Other specified chronic obstructive pulmonary disease: Secondary | ICD-10-CM

## 2023-08-22 MED ORDER — MOMETASONE FURO-FORMOTEROL FUM 100-5 MCG/ACT IN AERO: 2.0000 | INHALATION_SPRAY | Freq: Two times a day (BID) | RESPIRATORY_TRACT | 3 refills | Status: DC

## 2023-08-24 ENCOUNTER — Encounter: Payer: Self-pay | Admitting: Family Medicine

## 2023-08-24 NOTE — Telephone Encounter (Signed)
Pt notified via mychart msg.

## 2023-08-24 NOTE — Telephone Encounter (Signed)
Spoke to [facility] at Radiology she will have report read

## 2023-08-27 ENCOUNTER — Other Ambulatory Visit (HOSPITAL_COMMUNITY): Payer: Self-pay

## 2023-08-28 ENCOUNTER — Other Ambulatory Visit: Payer: Self-pay | Admitting: Family Medicine

## 2023-08-28 DIAGNOSIS — J4489 Other specified chronic obstructive pulmonary disease: Secondary | ICD-10-CM

## 2023-08-28 MED ORDER — ALBUTEROL SULFATE HFA 108 (90 BASE) MCG/ACT IN AERS: 1.0000 | INHALATION_SPRAY | Freq: Four times a day (QID) | RESPIRATORY_TRACT | 3 refills | Status: DC | PRN

## 2023-09-01 ENCOUNTER — Encounter: Payer: Self-pay | Admitting: Podiatry

## 2023-09-01 ENCOUNTER — Ambulatory Visit (INDEPENDENT_AMBULATORY_CARE_PROVIDER_SITE_OTHER): Payer: Medicare Other | Admitting: Podiatry

## 2023-09-01 VITALS — Ht 60.5 in | Wt 332.2 lb

## 2023-09-01 DIAGNOSIS — M7751 Other enthesopathy of right foot: Secondary | ICD-10-CM

## 2023-09-01 NOTE — Progress Notes (Signed)
Subjective:  Patient ID: Jasmine Larsen, female    DOB: [birth year],  MRN: [id number]  Chief Complaint  Patient presents with   Nail Problem    dfc    57 y.o. female presents with the above complaint.  Patient presents with right fifth metatarsophalangeal joint pain painful to touch is progressive gotten worse worse with ambulation worse with pressure patient would like for me to discuss treatment options she has not seen anyone else prior to seeing me denies any other acute complaints.  She would like to discuss steroid injection   Review of Systems: Negative except as noted in the HPI. Denies N/V/F/Ch.  Past Medical History:  Diagnosis Date   Asthma    CHF (congestive heart failure) (HCC)    COPD (chronic obstructive pulmonary disease) (HCC)    Diabetes mellitus without complication (HCC)    Miscarriage    Multiple gastric ulcers     Current Outpatient Medications:    Accu-Chek Softclix Lancets lancets, 1 each by Other route daily., Disp: 100 each, Rfl: 2   albuterol (VENTOLIN HFA) 108 (90 Base) MCG/ACT inhaler, Inhale 1-2 puffs into the lungs every 6 (six) hours as needed for wheezing or shortness of breath., Disp: 18 g, Rfl: 3   blood glucose meter kit and supplies KIT, Use up to 4 times daily., Disp: 1 each, Rfl: 0   Blood Glucose Monitoring Suppl DEVI, 1 each by Does not apply route as directed. May substitute to any manufacturer covered by patient's insurance., Disp: 1 each, Rfl: 0   Blood Glucose Monitoring Suppl DEVI, 1 each by Does not apply route in the morning, at noon, and at bedtime. May substitute to any manufacturer covered by patient's insurance., Disp: 1 each, Rfl: 0   cyanocobalamin (VITAMIN B12) 1000 MCG tablet, Take 1 tablet (1,000 mcg total) by mouth daily., Disp: 90 tablet, Rfl: 3   dicyclomine (BENTYL) 20 MG tablet, , Disp: , Rfl:    gabapentin (NEURONTIN) 300 MG capsule, Take 1 capsule (300 mg total) by mouth 3 (three) times daily., Disp: 90 capsule, Rfl: 2    Glucose Blood (BLOOD GLUCOSE TEST STRIPS) STRP, 1 each by In Vitro route daily. May substitute to any manufacturer covered by patient's insurance., Disp: 100 strip, Rfl: 2   Glucose Blood (BLOOD GLUCOSE TEST STRIPS) STRP, 1 each by In Vitro route in the morning, at noon, and at bedtime., Disp: 100 strip, Rfl: 0   Lancet Device MISC, 1 each by Does not apply route in the morning, at noon, and at bedtime. May substitute to any manufacturer covered by patient's insurance., Disp: 1 each, Rfl: 0   Lancets Misc. MISC, 1 each by Does not apply route in the morning, at noon, and at bedtime. May substitute to any manufacturer covered by patient's insurance., Disp: 100 each, Rfl: 3   Lancets Misc. MISC, 1 each by Does not apply route in the morning, at noon, and at bedtime. May substitute to any manufacturer covered by patient's insurance., Disp: 100 each, Rfl: 0   metFORMIN (GLUCOPHAGE) 500 MG tablet, Take 1 tablet (500 mg total) by mouth 2 (two) times daily with a meal., Disp: 360 tablet, Rfl: 0   mometasone-formoterol (DULERA) 100-5 MCG/ACT AERO, Inhale 2 puffs into the lungs 2 (two) times daily., Disp: 13 g, Rfl: 3   rosuvastatin (CRESTOR) 20 MG tablet, Take 1 tablet (20 mg total) by mouth daily., Disp: 90 tablet, Rfl: 3   saccharomyces boulardii (FLORASTOR) 250 MG capsule, Take 1 capsule (250  mg total) by mouth daily., Disp: 90 capsule, Rfl: 0   Semaglutide, 1 MG/DOSE, 4 MG/3ML SOPN, Inject 1 mg as directed once a week., Disp: 3 mL, Rfl: 6   tiZANidine (ZANAFLEX) 2 MG tablet, Take 1 tablet (2 mg total) by mouth at bedtime as needed for muscle spasms., Disp: 30 tablet, Rfl: 0   Vitamin D, Ergocalciferol, (DRISDOL) 1.25 MG (50000 UNIT) CAPS capsule, Take 1 capsule (50,000 Units total) by mouth every 7 (seven) days., Disp: 12 capsule, Rfl: 1  Social History   Tobacco Use  Smoking Status Every Day   Current packs/day: 2.00   Average packs/day: 2.0 packs/day for 40.0 years (80.0 ttl pk-yrs)   Types:  Cigarettes   Passive exposure: Past  Smokeless Tobacco Never    Allergies  Allergen Reactions   Cymbalta [Duloxetine Hcl]     SI thoughts   Objective:  There were no vitals filed for this visit. Body mass index is 63.82 kg/m. Constitutional Well developed. Well nourished.  Vascular Dorsalis pedis pulses palpable bilaterally. Posterior tibial pulses palpable bilaterally. Capillary refill normal to all digits.  No cyanosis or clubbing noted. Pedal hair growth normal.  Neurologic Normal speech. Oriented to person, place, and time. Epicritic sensation to light touch grossly present bilaterally.  Dermatologic Nails well groomed and normal in appearance. No open wounds. No skin lesions.  Orthopedic: Pain on palpation of right fifth metatarsophalangeal joint pain with range of motion of the joint plantarflexed fifth metatarsal noted   Radiographs: None Assessment:   1. Capsulitis of metatarsophalangeal (MTP) joint of right foot    Plan:  Patient was evaluated and treated and all questions answered.  Right fifth MTP capsulitis -All questions and concerns were discussed with the patient in extensive detail given the amount of pain that she is having she will benefit from steroid injection to help decrease inflammatory compensatory pain.  Patient agrees with plan-proceed with steroid injection -A steroid injection was performed at right fifth MTP using 1% plain Lidocaine and 10 mg of Kenalog. This was well tolerated.   No follow-ups on file.

## 2023-09-02 ENCOUNTER — Telehealth: Payer: Self-pay

## 2023-09-02 ENCOUNTER — Other Ambulatory Visit (HOSPITAL_COMMUNITY): Payer: Self-pay

## 2023-09-02 MED ORDER — TRELEGY ELLIPTA 100-62.5-25 MCG/ACT IN AEPB: 1.0000 | INHALATION_SPRAY | Freq: Every day | RESPIRATORY_TRACT | 11 refills | Status: DC

## 2023-09-02 NOTE — Telephone Encounter (Signed)
Pharmacy Patient Advocate Encounter   Received notification from  CC'D Chart  that prior authorization for Trelegy Inhalers is required/requested.   Insurance verification completed.   The patient is insured through [place] MEDICAID .   Per test claim: PA required; PA submitted to above mentioned insurance via [facility] Tracks Key/confirmation #/EOC [id number] W Status is pending

## 2023-09-02 NOTE — Telephone Encounter (Signed)
Pt notified via mychart

## 2023-09-02 NOTE — Addendum Note (Signed)
Addended by: Loree Fee on: 09/02/2023 01:45 PM   Modules accepted: Orders

## 2023-09-02 NOTE — Telephone Encounter (Signed)
Pharmacy Patient Advocate Encounter  Received notification from [facility] MEDICAID that Prior Authorization for Trelegy has been APPROVED from 09/02/2023 to 02/29/2024. Ran test claim, Copay is $4.00. This test claim was processed through [employer] [facility] [employer]- copay amounts may vary at other pharmacies due to pharmacy/plan contracts, or as the patient moves through the different stages of their insurance plan.   PA #/Case ID/Reference #: [id number] W

## 2023-09-03 ENCOUNTER — Encounter: Payer: Self-pay | Admitting: Pharmacist

## 2023-09-04 ENCOUNTER — Telehealth: Payer: Self-pay

## 2023-09-04 ENCOUNTER — Encounter: Payer: Self-pay | Admitting: Pulmonary Disease

## 2023-09-04 ENCOUNTER — Ambulatory Visit: Payer: Medicare Other | Admitting: Pulmonary Disease

## 2023-09-04 VITALS — BP 136/86 | HR 86 | Temp 97.6°F | Ht 60.5 in | Wt 337.8 lb

## 2023-09-04 DIAGNOSIS — R0609 Other forms of dyspnea: Secondary | ICD-10-CM

## 2023-09-04 DIAGNOSIS — R0683 Snoring: Secondary | ICD-10-CM | POA: Diagnosis not present

## 2023-09-04 DIAGNOSIS — F17210 Nicotine dependence, cigarettes, uncomplicated: Secondary | ICD-10-CM | POA: Diagnosis not present

## 2023-09-04 DIAGNOSIS — R0602 Shortness of breath: Secondary | ICD-10-CM

## 2023-09-04 LAB — NITRIC OXIDE: Nitric Oxide: <5

## 2023-09-04 NOTE — Progress Notes (Signed)
Synopsis: Referred in by Jasmine Allan, MD   Subjective:   PATIENT ID: Jasmine Larsen GENDER: female DOB: 1966-02-01, MRN: [id number]  Chief Complaint  Patient presents with   Consult    Cough. Shortness of breath on exertion and at rest.  Wheezing.     HPI Jasmine Larsen is a 57 year old female patient with a past medical history of  diabetes mellitus type 2, morbid obesity, presumed COPD, peripheral neuropathy, hyperlipidemia and heavy tobacco use presenting today to the pulmonary clinic to establish care.  She reports that she has been having shortness of breath cough with sputum production, wheezing and chest tightness for couple of years and gradually worsening.  She was given the diagnosis of COPD/asthma overlap and was started on Trelegy which she filled up yesterday.  She is mostly sedentary because when she walks her legs give up on her.  She is enrolled in the lung cancer screening program and had her first CT chest in April 2024 which showed a small 4.2 mm right upper lobe nodule stable from prior.  She also reports poor sleep, loud snoring with apneic episodes.  Fatigued throughout the day.  She was never tested for sleep apnea.  Social history: Smokes 2 to 3 packs/day for 35 years.  ROS All systems were reviewed and are negative except for the above. Objective:   Vitals:   09/04/23 1058  BP: 136/86  Pulse: 86  Temp: 97.6 F (36.4 C)  TempSrc: Temporal  SpO2: 95%  Weight: (!) 337 lb 12.8 oz (153.2 kg)  Height: 5' 0.5" (1.537 m)   95% on RA BMI Readings from Last 3 Encounters:  09/04/23 64.89 kg/m  09/01/23 63.82 kg/m  08/13/23 63.82 kg/m   Wt Readings from Last 3 Encounters:  09/04/23 (!) 337 lb 12.8 oz (153.2 kg)  09/01/23 (!) 332 lb 4 oz (150.7 kg)  08/13/23 (!) 332 lb 4 oz (150.7 kg)    Physical Exam GEN: NAD, morbidly obese HEENT: Supple Neck, Reactive Pupils, EOMI  CVS: Normal S1, Normal S2, RRR, No murmurs or ES appreciated  Lungs: Poor air  movement with minimal faint wheezing heard on expiration Abdomen: Soft, non tender, non distended, + BS  Extremities: Warm and well perfused, +1 lower extremity edema  Labs and imaging were reviewed Ancillary Information   CBC    Component Value Date/Time   WBC 7.6 08/06/2023 1428   RBC 5.21 (H) 08/06/2023 1428   HGB 14.4 08/06/2023 1428   HCT 45.1 08/06/2023 1428   PLT 200.0 08/06/2023 1428   MCV 86.5 08/06/2023 1428   MCH 28.2 05/02/2023 0422   MCHC 32.0 08/06/2023 1428   RDW 14.8 08/06/2023 1428   LYMPHSABS 2.5 08/06/2023 1428   MONOABS 0.4 08/06/2023 1428   EOSABS 0.1 08/06/2023 1428   BASOSABS 0.1 08/06/2023 1428        No data to display           Assessment & Plan:  Jasmine Larsen is a 57 year old female patient with a past medical history of  diabetes mellitus type 2, morbid obesity, presumed COPD, peripheral neuropathy, hyperlipidemia and heavy tobacco use presenting today to the pulmonary clinic to establish care.  # Presumed COPD Her symptoms of shortness of breath, chest tightness, cough with sputum production gradually worsening over time and started a year or 2 ago in context of heavy smoking is suggestive of COPD.  []  PFTs []  Agree with fluticasone-umeclidinium-vilanterol [Trelegy Ellipta] 1 puff once a day.  []  Agree  with albuterol 2 puffs every 6 hours as needed for shortness of breath.  # Heavy tobacco use Active smoker.  Smoked 2 to 3 packs/day for 35 years. []  Discussed importance of smoking cessation and its impact on COPD progression.  We currently decided to cut back to 1 pack/day instead of 2 to 3 packs and will discuss abstinence strategy on follow-up visit. []  Enrolled in the low-dose CT scan program and follow-up appointment in April 2025.  # Suspected OSA Patient with loud snoring at night, witnessed apneic episodes as well as fatigue throughout the day. STOP-BANG elevated and Epworth score elevated.  []  Split-night  # Morbid obesity BMI  65.  Discussed importance of weight loss and its impact on her multiple comorbidities including diabetes.  We also discussed its impact on her COPD and shortness of breath.  She is currently on Ozempic and working towards weight loss.  Return in about 3 months (around 12/05/2023).  I spent 60 minutes caring for this patient today, including preparing to see the patient, obtaining a medical history , reviewing a separately obtained history, performing a medically appropriate examination and/or evaluation, counseling and educating the patient/family/caregiver, ordering medications, tests, or procedures, documenting clinical information in the electronic health record, and independently interpreting results (not separately reported/billed) and communicating results to the patient/family/caregiver  Janann Colonel, MD  Pulmonary Critical Care 09/04/2023 12:19 PM

## 2023-09-04 NOTE — Telephone Encounter (Signed)
   Pre-operative Risk Assessment    Patient Name: Jasmine Larsen  DOB: 04/05/1966 MRN: [id number]     Request for Surgical Clearance    Procedure:  Colonoscopy   Date of Surgery:  Clearance 09/21/23                                 Surgeon:  Dr. Allegra Lai  Surgeon's Group or Practice Name:  [facility]  Phone number:  [phone] Fax number:  [phone]   Type of Clearance Requested:   - Medical    Type of Anesthesia:  General    Additional requests/questions:    SignedVernard Gambles   09/04/2023, 4:27 PM

## 2023-09-07 NOTE — Telephone Encounter (Signed)
   Patient Name: Jasmine Larsen  DOB: [birth year] MRN: [id number]  Primary Cardiologist: None  Chart reviewed as part of pre-operative protocol coverage.    -Was recently seen by Dr. Kirke Corin.  Echocardiogram ordered with normal LVEF.  Patient is scheduled for low risk procedure.  Is acceptable to undergo colonoscopy without any further cardiac testing.  No medications indicated as needing help.  Will route this bundled recommendation to requesting provider via Epic fax function and remove from pre-op pool. Please call with questions.  Sharlene Dory, PA-C 09/07/2023, 7:57 AM

## 2023-09-15 NOTE — Telephone Encounter (Signed)
Noted  

## 2023-09-18 ENCOUNTER — Encounter: Payer: Self-pay | Admitting: Gastroenterology

## 2023-09-21 ENCOUNTER — Ambulatory Visit: Payer: Medicare Other | Admitting: Anesthesiology

## 2023-09-21 ENCOUNTER — Ambulatory Visit
Admission: RE | Admit: 2023-09-21 | Discharge: 2023-09-21 | Disposition: A | Discharge status: Home / Self Care | Payer: Medicare Other | Attending: Gastroenterology | Admitting: Gastroenterology

## 2023-09-21 ENCOUNTER — Encounter
Admission: RE | Disposition: A | Discharge status: Home / Self Care | Payer: Self-pay | Source: Home / Self Care | Attending: Gastroenterology

## 2023-09-21 ENCOUNTER — Encounter: Payer: Self-pay | Admitting: Gastroenterology

## 2023-09-21 ENCOUNTER — Other Ambulatory Visit: Payer: Self-pay

## 2023-09-21 DIAGNOSIS — Q438 Other specified congenital malformations of intestine: Secondary | ICD-10-CM | POA: Insufficient documentation

## 2023-09-21 DIAGNOSIS — F17210 Nicotine dependence, cigarettes, uncomplicated: Secondary | ICD-10-CM | POA: Diagnosis not present

## 2023-09-21 DIAGNOSIS — Z8711 Personal history of peptic ulcer disease: Secondary | ICD-10-CM | POA: Diagnosis not present

## 2023-09-21 DIAGNOSIS — Z7985 Long-term (current) use of injectable non-insulin antidiabetic drugs: Secondary | ICD-10-CM | POA: Diagnosis not present

## 2023-09-21 DIAGNOSIS — I509 Heart failure, unspecified: Secondary | ICD-10-CM | POA: Insufficient documentation

## 2023-09-21 DIAGNOSIS — K621 Rectal polyp: Secondary | ICD-10-CM

## 2023-09-21 DIAGNOSIS — K5730 Diverticulosis of large intestine without perforation or abscess without bleeding: Secondary | ICD-10-CM | POA: Diagnosis not present

## 2023-09-21 DIAGNOSIS — Z1211 Encounter for screening for malignant neoplasm of colon: Secondary | ICD-10-CM | POA: Insufficient documentation

## 2023-09-21 DIAGNOSIS — Z7984 Long term (current) use of oral hypoglycemic drugs: Secondary | ICD-10-CM | POA: Diagnosis not present

## 2023-09-21 DIAGNOSIS — D125 Benign neoplasm of sigmoid colon: Secondary | ICD-10-CM | POA: Diagnosis not present

## 2023-09-21 DIAGNOSIS — J4489 Other specified chronic obstructive pulmonary disease: Secondary | ICD-10-CM | POA: Diagnosis not present

## 2023-09-21 DIAGNOSIS — E119 Type 2 diabetes mellitus without complications: Secondary | ICD-10-CM | POA: Insufficient documentation

## 2023-09-21 DIAGNOSIS — K635 Polyp of colon: Secondary | ICD-10-CM

## 2023-09-21 HISTORY — PX: POLYPECTOMY: SHX5525

## 2023-09-21 HISTORY — PX: COLONOSCOPY WITH PROPOFOL: SHX5780

## 2023-09-21 LAB — GLUCOSE, CAPILLARY: Glucose-Capillary: 147 mg/dL — ABNORMAL HIGH (ref 70–99)

## 2023-09-21 SURGERY — COLONOSCOPY WITH PROPOFOL
Anesthesia: General

## 2023-09-21 MED ORDER — PROPOFOL 500 MG/50ML IV EMUL
INTRAVENOUS | Status: DC | PRN
  Administered 2023-09-21: 100 mg via INTRAVENOUS
  Administered 2023-09-21: 200 ug/kg/min via INTRAVENOUS

## 2023-09-21 MED ORDER — MIDAZOLAM HCL 2 MG/2ML IJ SOLN
INTRAMUSCULAR | Status: DC | PRN
  Administered 2023-09-21: 2 mg via INTRAVENOUS

## 2023-09-21 MED ORDER — MIDAZOLAM HCL 2 MG/2ML IJ SOLN
INTRAMUSCULAR | Status: AC
  Filled 2023-09-21: qty 2

## 2023-09-21 MED ORDER — SODIUM CHLORIDE 0.9 % IV SOLN: INTRAVENOUS | Status: DC

## 2023-09-21 NOTE — Anesthesia Preprocedure Evaluation (Addendum)
Anesthesia Evaluation  Patient identified by MRN, date of birth, ID band Patient awake    Reviewed: Allergy & Precautions, NPO status , Patient's Chart, lab work & pertinent test results  History of Anesthesia Complications Negative for: history of anesthetic complications  Airway Mallampati: III  TM Distance: >3 FB Neck ROM: full    Dental  (+) Edentulous Upper, Poor Dentition, Missing   Pulmonary asthma , COPD,  COPD inhaler, Current Smoker   Pulmonary exam normal        Cardiovascular +CHF  Normal cardiovascular exam  Echo 07/2023 IMPRESSIONS     1. Left ventricular ejection fraction, by estimation, is 55 to 60%. The  left ventricle has normal function. The left ventricle has no regional  wall motion abnormalities. Left ventricular diastolic parameters are  consistent with Grade I diastolic  dysfunction (impaired relaxation).   2. Right ventricular systolic function is normal. The right ventricular  size is normal. Tricuspid regurgitation signal is inadequate for assessing  PA pressure.   3. The mitral valve is normal in structure. No evidence of mitral valve  regurgitation. No evidence of mitral stenosis.   4. The aortic valve is normal in structure. Aortic valve regurgitation is  not visualized. No aortic stenosis is present.   5. The inferior vena cava is normal in size with greater than 50%  respiratory variability, suggesting right atrial pressure of 3 mmHg.     Neuro/Psych  PSYCHIATRIC DISORDERS Anxiety Depression     Neuromuscular disease    GI/Hepatic Neg liver ROS, PUD,,,  Endo/Other  diabetes  Class 4 obesity  Renal/GU negative Renal ROS  negative genitourinary   Musculoskeletal   Abdominal   Peds  Hematology negative hematology ROS (+)   Anesthesia Other Findings Past Medical History: No date: Asthma No date: CHF (congestive heart failure) (HCC) No date: COPD (chronic obstructive pulmonary  disease) (HCC) No date: Diabetes mellitus without complication (HCC) No date: Miscarriage No date: Multiple gastric ulcers  Past Surgical History: No date: CESAREAN SECTION     Comment:  x3  BMI    Body Mass Index: 65.03 kg/m      Reproductive/Obstetrics negative OB ROS                             Anesthesia Physical Anesthesia Plan  ASA: 3  Anesthesia Plan: General   Post-op Pain Management:    Induction: Intravenous  PONV Risk Score and Plan: Propofol infusion and TIVA  Airway Management Planned: Natural Airway and Nasal Cannula  Additional Equipment:   Intra-op Plan:   Post-operative Plan:   Informed Consent: I have reviewed the patients History and Physical, chart, labs and discussed the procedure including the risks, benefits and alternatives for the proposed anesthesia with the patient or authorized representative who has indicated his/her understanding and acceptance.     Dental Advisory Given  Plan Discussed with: Anesthesiologist, CRNA and Surgeon  Anesthesia Plan Comments: (Patient consented for risks of anesthesia including but not limited to:  - adverse reactions to medications - risk of airway placement if required - damage to eyes, teeth, lips or other oral mucosa - nerve damage due to positioning  - sore throat or hoarseness - Damage to heart, brain, nerves, lungs, other parts of body or loss of life  Patient voiced understanding and assent.)        Anesthesia Quick Evaluation

## 2023-09-21 NOTE — H&P (Signed)
[name], MD [address]  [address]  [place], [place] [address]  Main: [phone]  Fax: [phone] Pager: [phone]  Primary Care Physician:  [name], MD Primary Gastroenterologist:  Dr. [name]  Pre-Procedure History & Physical: HPI:  Jasmine Larsen is a 57 y.o. female is here for an colonoscopy.   Past Medical History:  Diagnosis Date   Asthma    CHF (congestive heart failure) (HCC)    COPD (chronic obstructive pulmonary disease) (HCC)    Diabetes mellitus without complication (HCC)    Miscarriage    Multiple gastric ulcers     Past Surgical History:  Procedure Laterality Date   CESAREAN SECTION     x3    Prior to Admission medications   Medication Sig Start Date End Date Taking? Authorizing Provider  Accu-Chek Softclix Lancets lancets 1 each by Other route daily. 08/13/23  Yes [name], MD  blood glucose meter kit and supplies KIT Use up to 4 times daily. 08/17/23  Yes [name], MD  Blood Glucose Monitoring Suppl DEVI 1 each by Does not apply route as directed. May substitute to any manufacturer covered by patient's insurance. 08/13/23  Yes [name], MD  cyanocobalamin (VITAMIN B12) 1000 MCG tablet Take 1 tablet (1,000 mcg total) by mouth daily. 08/09/23  Yes [name], MD  dicyclomine (BENTYL) 20 MG tablet    Yes [provider]  Fluticasone-Umeclidin-Vilant (TRELEGY ELLIPTA) 100-62.5-25 MCG/ACT AEPB Inhale 1 puff into the lungs daily. 09/02/23  Yes [name], MD  gabapentin (NEURONTIN) 300 MG capsule Take 1 capsule (300 mg total) by mouth 3 (three) times daily. 06/19/23 06/18/24 Yes Bradler, Clent Jacks, MD  Glucose Blood (BLOOD GLUCOSE TEST STRIPS) STRP 1 each by In Vitro route daily. May substitute to any manufacturer covered by patient's insurance. 07/22/23  Yes Eden Emms, NP  metFORMIN (GLUCOPHAGE) 500 MG tablet Take 1 tablet (500 mg total) by mouth 2 (two) times daily with a meal. 08/13/23  Yes [name], MD   rosuvastatin (CRESTOR) 20 MG tablet Take 1 tablet (20 mg total) by mouth daily. 08/13/23  Yes [name], MD  Semaglutide, 1 MG/DOSE, 4 MG/3ML SOPN Inject 1 mg as directed once a week. 08/13/23  Yes [name], MD  tiZANidine (ZANAFLEX) 2 MG tablet Take 1 tablet (2 mg total) by mouth at bedtime as needed for muscle spasms. 08/17/23  Yes [name], MD  Vitamin D, Ergocalciferol, (DRISDOL) 1.25 MG (50000 UNIT) CAPS capsule Take 1 capsule (50,000 Units total) by mouth every 7 (seven) days. 08/09/23  Yes [name], MD  albuterol (VENTOLIN HFA) 108 (90 Base) MCG/ACT inhaler Inhale 1-2 puffs into the lungs every 6 (six) hours as needed for wheezing or shortness of breath. Patient not taking: Reported on 09/04/2023 08/28/23   [name], MD    Allergies as of 08/07/2023 - Review Complete 08/06/2023  Allergen Reaction Noted   Cymbalta [duloxetine hcl]  01/26/2023    Family History  Problem Relation Age of Onset   Asthma Mother    Diabetes Mother    COPD Mother    Heart Problems Mother    Diabetes Brother    COPD Brother    Congestive Heart Failure Brother    Alcohol abuse Maternal Grandmother    Tuberculosis Maternal Grandmother    Lung cancer Maternal Grandmother     Social History   Socioeconomic History   Marital status: Divorced    Spouse name: Not on file  Number of children: 3   Years of education: Not on file   Highest education level: 8th grade  Occupational History   Not on file  Tobacco Use   Smoking status: Every Day    Current packs/day: 2.50    Average packs/day: 2.5 packs/day for 41.9 years (104.8 ttl pk-yrs)    Types: Cigarettes    Start date: 17    Passive exposure: Past   Smokeless tobacco: Never  Vaping Use   Vaping status: Never Used  Substance and Sexual Activity   Alcohol use: No   Drug use: No   Sexual activity: Not Currently  Other Topics Concern   Not on file  Social History Narrative   Disable      Curtis )37-36)   Rosalita Chessman (  35)   Jamesetta Orleans (33)      Hobbies: none    Social Determinants of Health   Financial Resource Strain: Patient Declined (08/12/2023)   Overall Financial Resource Strain (CARDIA)    Difficulty of Paying Living Expenses: Patient declined  Food Insecurity: No Food Insecurity (08/12/2023)   Hunger Vital Sign    Worried About Running Out of Food in the Last Year: Never true    Ran Out of Food in the Last Year: Never true  Transportation Needs: No Transportation Needs (08/12/2023)   PRAPARE - Administrator, Civil Service (Medical): No    Lack of Transportation (Non-Medical): No  Physical Activity: Unknown (08/12/2023)   Exercise Vital Sign    Days of Exercise per Week: 0 days    Minutes of Exercise per Session: Not on file  Stress: Patient Declined (08/12/2023)   [employer] of Occupational Health - Occupational Stress Questionnaire    Feeling of Stress : Patient declined  Social Connections: Unknown (08/12/2023)   Social Connection and Isolation Panel [NHANES]    Frequency of Communication with Friends and Family: Once a week    Frequency of Social Gatherings with Friends and Family: Patient declined    Attends Religious Services: Never    Database administrator or Organizations: No    Attends Engineer, structural: Not on file    Marital Status: Divorced  Intimate Partner Violence: Patient Declined (04/28/2023)   Humiliation, Afraid, Rape, and Kick questionnaire    Fear of Current or Ex-Partner: Patient declined    Emotionally Abused: Patient declined    Physically Abused: Patient declined    Sexually Abused: Patient declined    Review of Systems: See HPI, otherwise negative ROS  Physical Exam: BP 134/77   Temp (!) 96.4 F (35.8 C) (Temporal)   Resp 18   Ht 5' (1.524 m)   Wt (!) 151 kg   SpO2 100%   BMI 65.03 kg/m  General:   Alert,  pleasant and cooperative in NAD Head:  Normocephalic and atraumatic. Neck:  Supple; no masses or  thyromegaly. Lungs:  Clear throughout to auscultation.    Heart:  Regular rate and rhythm. Abdomen:  Soft, nontender and nondistended. Normal bowel sounds, without guarding, and without rebound.   Neurologic:  Alert and  oriented x4;  grossly normal neurologically.  Impression/Plan: AIMA KEANEY is here for an colonoscopy to be performed for colon cancer screening  Risks, benefits, limitations, and alternatives regarding  colonoscopy have been reviewed with the patient.  Questions have been answered.  All parties agreeable.   Lannette Donath, MD  09/21/2023, 9:16 AM

## 2023-09-21 NOTE — Op Note (Signed)
[facility] Patient Name: Jasmine Larsen Procedure Date: 09/21/2023 9:53 AM MRN: [id number] Account #: [id number] Date of Birth: 08-24-1966 Admit Type: Outpatient Age: 57 Room: [facility] ENDO ROOM 4 Gender: Female Note Status: Finalized Instrument Name: Prentice Docker [id number] Procedure:             Colonoscopy Indications:           Screening for colorectal malignant neoplasm, This is                         the patient's first colonoscopy Providers:             Toney Reil MD, MD Referring MD:          [name] (Referring MD) Medicines:             General Anesthesia Complications:         No immediate complications. Estimated blood loss: None. Procedure:             Pre-Anesthesia Assessment:                        - Prior to the procedure, a History and Physical was                         performed, and patient medications and allergies were                         reviewed. The patient is competent. The risks and                         benefits of the procedure and the sedation options and                         risks were discussed with the patient. All questions                         were answered and informed consent was obtained.                         Patient identification and proposed procedure were                         verified by the physician, the nurse, the                         anesthesiologist, the anesthetist and the technician                         in the pre-procedure area in the procedure room in the                         endoscopy suite. Mental Status Examination: alert and                         oriented. Airway Examination: normal oropharyngeal                         airway and neck mobility. Respiratory Examination:  clear to auscultation. CV Examination: normal.                         Prophylactic Antibiotics: The patient does not require                         prophylactic  antibiotics. Prior Anticoagulants: The                         patient has taken no anticoagulant or antiplatelet                         agents. ASA Grade Assessment: III - A patient with                         severe systemic disease. After reviewing the risks and                         benefits, the patient was deemed in satisfactory                         condition to undergo the procedure. The anesthesia                         plan was to use general anesthesia. Immediately prior                         to administration of medications, the patient was                         re-assessed for adequacy to receive sedatives. The                         heart rate, respiratory rate, oxygen saturations,                         blood pressure, adequacy of pulmonary ventilation, and                         response to care were monitored throughout the                         procedure. The physical status of the patient was                         re-assessed after the procedure.                        After obtaining informed consent, the colonoscope was                         passed under direct vision. Throughout the procedure,                         the patient's blood pressure, pulse, and oxygen                         saturations were monitored continuously. The  Colonoscope was introduced through the anus and                         advanced to the the cecum, identified by appendiceal                         orifice and ileocecal valve. The colonoscopy was                         performed with moderate difficulty due to significant                         looping and the patient's body habitus. Successful                         completion of the procedure was aided by applying                         abdominal pressure. The patient tolerated the                         procedure well. The quality of the bowel preparation                         was  evaluated using the BBPS [facility] Bowel Preparation                         Scale) with scores of: Right Colon = 3, Transverse                         Colon = 3 and Left Colon = 3 (entire mucosa seen well                         with no residual staining, small fragments of stool or                         opaque liquid). The total BBPS score equals 9. The                         ileocecal valve, appendiceal orifice, and rectum were                         photographed. Findings:      The perianal and digital rectal examinations were normal. Pertinent       negatives include normal sphincter tone and no palpable rectal lesions.      Three sessile polyps were found in the rectum and sigmoid colon. The       polyps were 4 to 6 mm in size. These polyps were removed with a cold       snare. Resection and retrieval were complete. Estimated blood loss: none.      Multiple medium-mouthed diverticula were found in the recto-sigmoid       colon and sigmoid colon.      The retroflexed view of the distal rectum and anal verge was normal and       showed no anal or rectal abnormalities. Impression:            -  Three 4 to 6 mm polyps in the rectum and in the                         sigmoid colon, removed with a cold snare. Resected and                         retrieved.                        - Diverticulosis in the recto-sigmoid colon and in the                         sigmoid colon.                        - The distal rectum and anal verge are normal on                         retroflexion view. Recommendation:        - Discharge patient to home (with escort).                        - Resume previous diet today.                        - Continue present medications.                        - Await pathology results.                        - Repeat colonoscopy in 3 - 5 years for surveillance                         of multiple polyps. Procedure Code(s):     --- Professional ---                         [phone], Colonoscopy, flexible; with removal of                         tumor(s), polyp(s), or other lesion(s) by snare                         technique Diagnosis Code(s):     --- Professional ---                        Z12.11, Encounter for screening for malignant neoplasm                         of colon                        D12.8, Benign neoplasm of rectum                        D12.5, Benign neoplasm of sigmoid colon                        K57.30, Diverticulosis of large intestine without  perforation or abscess without bleeding CPT copyright 2022 American Medical Association. All rights reserved. The codes documented in this report are preliminary and upon coder review may  be revised to meet current compliance requirements. Dr. Libby Maw Toney Reil MD, MD 09/21/2023 10:26:50 AM This report has been signed electronically. Number of Addenda: 0 Note Initiated On: 09/21/2023 9:53 AM Scope Withdrawal Time: 0 hours 9 minutes 53 seconds  Total Procedure Duration: 0 hours 14 minutes 50 seconds  Estimated Blood Loss:  Estimated blood loss: none.      [facility]

## 2023-09-21 NOTE — Transfer of Care (Signed)
Immediate Anesthesia Transfer of Care Note  Patient: Jasmine Larsen  Procedure(s) Performed: COLONOSCOPY WITH PROPOFOL POLYPECTOMY  Patient Location: PACU  Anesthesia Type:General  Level of Consciousness: awake  Airway & Oxygen Therapy: Patient Spontanous Breathing  Post-op Assessment: Report given to RN and Post -op Vital signs reviewed and stable  Post vital signs: Reviewed and stable  Last Vitals:  Vitals Value Taken Time  BP    Temp 35.9 C 09/21/23 1031  Pulse    Resp 20 09/21/23 1031  SpO2 95 % 09/21/23 1031    Last Pain:  Vitals:   09/21/23 1031  TempSrc: Temporal  PainSc: 0-No pain         Complications: There were no known notable events for this encounter.

## 2023-09-21 NOTE — Anesthesia Postprocedure Evaluation (Signed)
Anesthesia Post Note  Patient: Jasmine Larsen  Procedure(s) Performed: COLONOSCOPY WITH PROPOFOL POLYPECTOMY  Patient location during evaluation: Endoscopy Anesthesia Type: General Level of consciousness: awake and alert Pain management: pain level controlled Vital Signs Assessment: post-procedure vital signs reviewed and stable Respiratory status: spontaneous breathing, nonlabored ventilation, respiratory function stable and patient connected to nasal cannula oxygen Cardiovascular status: blood pressure returned to baseline and stable Postop Assessment: no apparent nausea or vomiting Anesthetic complications: no   There were no known notable events for this encounter.   Last Vitals:  Vitals:   09/21/23 1041 09/21/23 1049  BP: (!) 98/57 (!) 117/95  Resp: 18 18  Temp:    SpO2: 99% 97%    Last Pain:  Vitals:   09/21/23 1049  TempSrc:   PainSc: 0-No pain                 Louie Boston

## 2023-09-22 ENCOUNTER — Encounter: Payer: Self-pay | Admitting: Gastroenterology

## 2023-09-22 LAB — SURGICAL PATHOLOGY

## 2023-09-29 ENCOUNTER — Ambulatory Visit (INDEPENDENT_AMBULATORY_CARE_PROVIDER_SITE_OTHER): Payer: Medicare Other | Admitting: Nurse Practitioner

## 2023-09-29 ENCOUNTER — Telehealth: Payer: Self-pay | Admitting: Pulmonary Disease

## 2023-09-29 ENCOUNTER — Encounter (INDEPENDENT_AMBULATORY_CARE_PROVIDER_SITE_OTHER): Payer: Self-pay | Admitting: Nurse Practitioner

## 2023-09-29 VITALS — BP 138/90 | HR 82 | Resp 20 | Ht 64.0 in | Wt 333.0 lb

## 2023-09-29 DIAGNOSIS — R600 Localized edema: Secondary | ICD-10-CM | POA: Diagnosis not present

## 2023-09-29 DIAGNOSIS — E1140 Type 2 diabetes mellitus with diabetic neuropathy, unspecified: Secondary | ICD-10-CM | POA: Diagnosis not present

## 2023-09-29 DIAGNOSIS — I5032 Chronic diastolic (congestive) heart failure: Secondary | ICD-10-CM

## 2023-09-29 NOTE — Telephone Encounter (Signed)
Lm for patient.  

## 2023-09-29 NOTE — Telephone Encounter (Signed)
Pt is asking to see another provider. Felt the one she saw was uncaring and rude.

## 2023-09-30 ENCOUNTER — Encounter: Payer: Self-pay | Admitting: Family Medicine

## 2023-09-30 NOTE — Progress Notes (Signed)
Subjective:    Patient ID: Jasmine Larsen, female    DOB: 1966-08-18, 57 y.o.   MRN: [id number] Chief Complaint  Patient presents with   New Patient (Initial Visit)    np. consult. LE edema. Jasmine Larsen    The patient is a 57 year old female who presents today for evaluation of bilateral lower extremity edema.  She notes that the swelling has been ongoing for years.  It tends to be better in the morning and worse as the day progresses.  She notes that she also has burning and stinging discomfort in the legs but this is also been ongoing for some time.  She is currently on gabapentin and previously it was helpful but she notes that it has not been as helpful recently.  Currently she does not have any open wounds or ulcerations.  Currently there is no weeping.  She has previously tried medical grade compression but they have been tight and uncomfortable for her.      Review of Systems  Cardiovascular:  Positive for leg swelling.  Musculoskeletal:  Positive for back pain.  Neurological:  Positive for numbness.  All other systems reviewed and are negative.      Objective:   Physical Exam Vitals reviewed.  HENT:     Head: Normocephalic.  Cardiovascular:     Rate and Rhythm: Normal rate.     Pulses:          Dorsalis pedis pulses are 1+ on the right side and 2+ on the left side.  Pulmonary:     Effort: Pulmonary effort is normal.  Skin:    General: Skin is warm and dry.  Neurological:     Mental Status: She is alert and oriented to person, place, and time.  Psychiatric:        Mood and Affect: Mood normal.        Behavior: Behavior normal.        Thought Content: Thought content normal.        Judgment: Judgment normal.     BP (!) 138/90   Pulse 82   Resp 20   Ht 5\' 4"  (1.626 m)   Wt (!) 333 lb (151 kg)   BMI 57.16 kg/m   Past Medical History:  Diagnosis Date   Asthma    CHF (congestive heart failure) (HCC)    COPD (chronic obstructive pulmonary disease) (HCC)     Diabetes mellitus without complication (HCC)    Miscarriage    Multiple gastric ulcers     Social History   Socioeconomic History   Marital status: Divorced    Spouse name: Not on file   Number of children: 3   Years of education: Not on file   Highest education level: 8th grade  Occupational History   Not on file  Tobacco Use   Smoking status: Every Day    Current packs/day: 2.50    Average packs/day: 2.5 packs/day for 42.0 years (104.9 ttl pk-yrs)    Types: Cigarettes    Start date: 61    Passive exposure: Past   Smokeless tobacco: Never  Vaping Use   Vaping status: Never Used  Substance and Sexual Activity   Alcohol use: No   Drug use: No   Sexual activity: Not Currently  Other Topics Concern   Not on file  Social History Narrative   Disable      Jasmine Larsen )35-36)   Jasmine Larsen ( 35)   Jasmine Larsen (33)  Hobbies: none    Social Drivers of Corporate investment banker Strain: Patient Declined (08/12/2023)   Overall Financial Resource Strain (CARDIA)    Difficulty of Paying Living Expenses: Patient declined  Food Insecurity: No Food Insecurity (08/12/2023)   Hunger Vital Sign    Worried About Running Out of Food in the Last Year: Never true    Ran Out of Food in the Last Year: Never true  Transportation Needs: No Transportation Needs (08/12/2023)   PRAPARE - Administrator, Civil Service (Medical): No    Lack of Transportation (Non-Medical): No  Physical Activity: Unknown (08/12/2023)   Exercise Vital Sign    Days of Exercise per Week: 0 days    Minutes of Exercise per Session: Not on file  Stress: Patient Declined (08/12/2023)   [employer] of Occupational Health - Occupational Stress Questionnaire    Feeling of Stress : Patient declined  Social Connections: Unknown (08/12/2023)   Social Connection and Isolation Panel [NHANES]    Frequency of Communication with Friends and Family: Once a week    Frequency of Social Gatherings with Friends  and Family: Patient declined    Attends Religious Services: Never    Database administrator or Organizations: No    Attends Engineer, structural: Not on file    Marital Status: Divorced  Intimate Partner Violence: Patient Declined (04/28/2023)   Humiliation, Afraid, Rape, and Kick questionnaire    Fear of Current or Ex-Partner: Patient declined    Emotionally Abused: Patient declined    Physically Abused: Patient declined    Sexually Abused: Patient declined    Past Surgical History:  Procedure Laterality Date   CESAREAN SECTION     x3   COLONOSCOPY WITH PROPOFOL N/A 09/21/2023   Procedure: COLONOSCOPY WITH PROPOFOL;  Surgeon: Toney Reil, MD;  Location: [facility] ENDOSCOPY;  Service: Gastroenterology;  Laterality: N/A;   POLYPECTOMY  09/21/2023   Procedure: POLYPECTOMY;  Surgeon: Toney Reil, MD;  Location: [facility] ENDOSCOPY;  Service: Gastroenterology;;    Family History  Problem Relation Age of Onset   Asthma Mother    Diabetes Mother    COPD Mother    Heart Problems Mother    Diabetes Brother    COPD Brother    Congestive Heart Failure Brother    Alcohol abuse Maternal Grandmother    Tuberculosis Maternal Grandmother    Lung cancer Maternal Grandmother     Allergies  Allergen Reactions   Cymbalta [Duloxetine Hcl]     SI thoughts       Latest Ref Rng & Units 08/06/2023    2:28 PM 05/02/2023    4:22 AM 04/30/2023    2:45 AM  CBC  WBC 4.0 - 10.5 K/uL 7.6  13.6  15.1   Hemoglobin 12.0 - 15.0 g/dL 29.5  28.4  13.2   Hematocrit 36.0 - 46.0 % 45.1  35.6  37.9   Platelets 150.0 - 400.0 K/uL 200.0  275  271       CMP     Component Value Date/Time   NA 137 08/06/2023 1428   K 4.4 08/06/2023 1428   CL 100 08/06/2023 1428   CO2 29 08/06/2023 1428   GLUCOSE 104 (H) 08/06/2023 1428   BUN 14 08/06/2023 1428   CREATININE 0.59 08/06/2023 1428   CALCIUM 9.0 08/06/2023 1428   PROT 7.2 08/06/2023 1428   ALBUMIN 4.2 08/06/2023 1428   AST 10 08/06/2023  1428   ALT 14 08/06/2023  1428   ALKPHOS 90 08/06/2023 1428   BILITOT 0.3 08/06/2023 1428   GFR 101.36 08/06/2023 1428   GFRNONAA >60 05/02/2023 0422     No results found.     Assessment & Plan:   1. Lower extremity edema (Primary) Recommend:  I have had a long discussion with the patient regarding swelling and why it  causes symptoms.  Patient will begin wearing graduated compression on a daily basis a prescription was given. The patient will  wear the stockings first thing in the morning and removing them in the evening. The patient is instructed specifically not to sleep in the stockings.   In addition, behavioral modification will be initiated.  This will include frequent elevation, use of over the counter pain medications and exercise such as walking.  Consideration for a lymph pump will also be made based upon the effectiveness of conservative therapy.  This would help to improve the edema control and prevent sequela such as ulcers and infections   Patient should undergo duplex ultrasound of the venous system to ensure that DVT or reflux is not present.  The patient will follow-up with me after the ultrasound.   2. Type 2 diabetes mellitus with diabetic neuropathy, without long-term current use of insulin (HCC) The patient has complaints of burning and stinging in her feet.  I suspect that her larger issues with neuropathy.  This can certainly be exacerbated by her swelling.  She is currently on gabapentin but does not feel its effective.  She will discuss this with her PCP.  I will also refer her to neurology for further evaluation for neuropathy.  3. Chronic diastolic CHF (congestive heart failure) (HCC) This can also contribute to the patient's lower extremity edema   Current Outpatient Medications on File Prior to Visit  Medication Sig Dispense Refill   Accu-Chek Softclix Lancets lancets 1 each by Other route daily. 100 each 2   albuterol (VENTOLIN HFA) 108 (90 Base)  MCG/ACT inhaler Inhale 1-2 puffs into the lungs every 6 (six) hours as needed for wheezing or shortness of breath. 18 g 3   blood glucose meter kit and supplies KIT Use up to 4 times daily. 1 each 0   Blood Glucose Monitoring Suppl DEVI 1 each by Does not apply route as directed. May substitute to any manufacturer covered by patient's insurance. 1 each 0   cyanocobalamin (VITAMIN B12) 1000 MCG tablet Take 1 tablet (1,000 mcg total) by mouth daily. 90 tablet 3   dicyclomine (BENTYL) 20 MG tablet      Fluticasone-Umeclidin-Vilant (TRELEGY ELLIPTA) 100-62.5-25 MCG/ACT AEPB Inhale 1 puff into the lungs daily. 1 each 11   gabapentin (NEURONTIN) 300 MG capsule Take 1 capsule (300 mg total) by mouth 3 (three) times daily. 90 capsule 2   Glucose Blood (BLOOD GLUCOSE TEST STRIPS) STRP 1 each by In Vitro route daily. May substitute to any manufacturer covered by patient's insurance. 100 strip 2   metFORMIN (GLUCOPHAGE) 500 MG tablet Take 1 tablet (500 mg total) by mouth 2 (two) times daily with a meal. 360 tablet 0   rosuvastatin (CRESTOR) 20 MG tablet Take 1 tablet (20 mg total) by mouth daily. 90 tablet 3   Semaglutide, 1 MG/DOSE, 4 MG/3ML SOPN Inject 1 mg as directed once a week. 3 mL 6   tiZANidine (ZANAFLEX) 2 MG tablet Take 1 tablet (2 mg total) by mouth at bedtime as needed for muscle spasms. 30 tablet 0   Vitamin D, Ergocalciferol, (DRISDOL) 1.25 MG (  50000 UNIT) CAPS capsule Take 1 capsule (50,000 Units total) by mouth every 7 (seven) days. 12 capsule 1   No current facility-administered medications on file prior to visit.    There are no Patient Instructions on file for this visit. No follow-ups on file.   Georgiana Spinner, NP

## 2023-10-02 NOTE — Telephone Encounter (Signed)
Spoke to unknown female and requested that she have pt return our call.

## 2023-10-05 NOTE — Telephone Encounter (Signed)
Lm x2 for patient.  Will call once more due nature of call.

## 2023-10-05 NOTE — Telephone Encounter (Signed)
Called and spoke daughter. She stated that she is away from home. She will have pt return our call when she gets home.

## 2023-10-05 NOTE — Telephone Encounter (Signed)
Spoke to patient's daughter, Erika(DPR) and scheduled appt with Dr. Jayme Cloud for 11/12/2023 at 8:30. Nothing further needed.

## 2023-10-05 NOTE — Telephone Encounter (Signed)
Yes ok to switch, I am perplexed by the complaint as well.

## 2023-10-05 NOTE — Telephone Encounter (Signed)
Pt is asking for a return call 1:30 ish today

## 2023-10-05 NOTE — Telephone Encounter (Signed)
Spoke to patient.  She would like switch providers. She had an unpleasant experience with previous provider.  She is aware of office protocol.   Dr. Jayme Cloud and Dr. Larinda Buttery, please advise if okay to switch? Thanks

## 2023-10-05 NOTE — Telephone Encounter (Signed)
Will call patient to schedule after JP approves.

## 2023-10-08 ENCOUNTER — Other Ambulatory Visit: Payer: Self-pay | Admitting: Surgery

## 2023-10-09 ENCOUNTER — Telehealth: Payer: Self-pay

## 2023-10-09 NOTE — Telephone Encounter (Signed)
Called pt and daughter left a message to give office a call back. Need to find out why pt declined a referral.

## 2023-10-09 NOTE — Telephone Encounter (Signed)
Left message to return call to our office.  See previous phone note

## 2023-10-09 NOTE — Telephone Encounter (Signed)
Copied from CRM [id number]. Topic: Clinical - Medical Advice >> Oct 09, 2023 11:18 AM Efraim Kaufmann C wrote: Reason for CRM: Patient received a call from [place] and was returning that call

## 2023-10-11 ENCOUNTER — Other Ambulatory Visit: Payer: Self-pay | Admitting: Family Medicine

## 2023-10-11 DIAGNOSIS — M5442 Lumbago with sciatica, left side: Secondary | ICD-10-CM

## 2023-10-11 DIAGNOSIS — E1140 Type 2 diabetes mellitus with diabetic neuropathy, unspecified: Secondary | ICD-10-CM

## 2023-10-11 MED ORDER — TIZANIDINE HCL 4 MG PO TABS: 4.0000 mg | ORAL_TABLET | Freq: Every evening | ORAL | 3 refills | Status: DC | PRN

## 2023-10-11 MED ORDER — GABAPENTIN 400 MG PO CAPS: 400.0000 mg | ORAL_CAPSULE | Freq: Three times a day (TID) | ORAL | 2 refills | Status: DC

## 2023-10-20 ENCOUNTER — Encounter
Admission: RE | Admit: 2023-10-20 | Discharge: 2023-10-20 | Disposition: A | Discharge status: Home / Self Care | Payer: Medicare Other | Source: Ambulatory Visit | Attending: Surgery | Admitting: Surgery

## 2023-10-20 ENCOUNTER — Other Ambulatory Visit: Payer: Self-pay | Admitting: Orthopedic Surgery

## 2023-10-20 ENCOUNTER — Other Ambulatory Visit: Payer: Self-pay

## 2023-10-20 VITALS — Ht 64.0 in | Wt 333.0 lb

## 2023-10-20 DIAGNOSIS — E118 Type 2 diabetes mellitus with unspecified complications: Secondary | ICD-10-CM

## 2023-10-20 DIAGNOSIS — E1140 Type 2 diabetes mellitus with diabetic neuropathy, unspecified: Secondary | ICD-10-CM

## 2023-10-20 DIAGNOSIS — M4807 Spinal stenosis, lumbosacral region: Secondary | ICD-10-CM

## 2023-10-20 HISTORY — DX: Hyperlipidemia, unspecified: E78.5

## 2023-10-20 HISTORY — DX: Anxiety disorder, unspecified: F41.9

## 2023-10-20 HISTORY — DX: Depression, unspecified: F32.A

## 2023-10-20 HISTORY — DX: Pneumonia due to coronavirus disease 2019: J12.82

## 2023-10-20 HISTORY — DX: COVID-19: J12.82

## 2023-10-20 HISTORY — DX: Acute respiratory failure with hypoxia: J96.01

## 2023-10-20 HISTORY — DX: COVID-19: U07.1

## 2023-10-20 HISTORY — DX: Sepsis, unspecified organism: A41.9

## 2023-10-20 NOTE — Patient Instructions (Addendum)
 Your procedure is scheduled on: Wednesday 10/28/23 To find out your arrival time, please call [phone] between 1PM - 3PM on:   Tuesday 10/27/23 Report to the Registration Desk on the 1st floor of the [facility]. FREE Valet parking is available.  If your arrival time is 6:00 am, do not arrive before that time as the [facility] entrance doors do not open until 6:00 am.  REMEMBER: Instructions that are not followed completely may result in serious medical risk, up to and including death; or upon the discretion of your surgeon and anesthesiologist your surgery may need to be rescheduled.  Do not eat food after midnight the night before surgery.  No gum chewing or hard candies.  You may however, drink CLEAR liquids up to 2 hours before you are scheduled to arrive for your surgery. Do not drink anything within 2 hours of your scheduled arrival time.  Clear liquids include: - water   Type 1 and Type 2 diabetics should only drink water..  One week prior to surgery: Stop Anti-inflammatories (NSAIDS) such as Advil, Aleve, Ibuprofen, Motrin, Naproxen, Naprosyn and Aspirin  based products such as Excedrin, Goody's Powder, BC Powder. You may however, continue to take Tylenol  if needed for pain up until the day of surgery.  Stop ANY OVER THE COUNTER supplements and vitamins TODAY 10/20/23 until after surgery.  Continue taking all prescribed medications with the exception of the following: Semaglutide , skip this weeks Wednesday dose. Can resume after surgery. Hold Metformin  for 2 days with last dose Sunday night 09/23/24.  TAKE ONLY THESE MEDICATIONS THE MORNING OF SURGERY WITH A SIP OF WATER:  gabapentin  (NEURONTIN ) 400 MG capsule   Use inhalers on the day of surgery and bring to the hospital.  No Alcohol for 24 hours before or after surgery.  No Smoking including e-cigarettes for 24 hours before surgery.  No chewable tobacco products for at least 6 hours before surgery.  No nicotine   patches on the day of surgery.  Do not use any recreational drugs for at least a week (preferably 2 weeks) before your surgery.  Please be advised that the combination of cocaine and anesthesia may have negative outcomes, up to and including death. If you test positive for cocaine, your surgery will be cancelled.  On the morning of surgery brush your teeth with toothpaste and water, you may rinse your mouth with mouthwash if you wish. Do not swallow any toothpaste or mouthwash.  Use CHG Soap or wipes as directed on instruction sheet.  Do not wear lotions, powders, or perfumes.   Do not shave body hair from the neck down 48 hours before surgery.  Wear clean comfortable clothing (specific to your surgery type) to the hospital.  Do not wear jewelry, make-up, hairpins, clips or nail polish.  For welded (permanent) jewelry: bracelets, anklets, waist bands, etc.  Please have this removed prior to surgery.  If it is not removed, there is a chance that hospital personnel will need to cut it off on the day of surgery. Contact lenses, hearing aids and dentures may not be worn into surgery.  Do not bring valuables to the hospital. [facility] is not responsible for any missing/lost belongings or valuables.   Notify your doctor if there is any change in your medical condition (cold, fever, infection).  If you are being discharged the day of surgery, you will not be allowed to drive home. You will need a responsible individual to drive you home and stay with you  for 24 hours after surgery.   If you are taking public transportation, you will need to have a responsible individual with you.  If you are being admitted to the hospital overnight, leave your suitcase in the car. After surgery it may be brought to your room.  In case of increased patient census, it may be necessary for you, the patient, to continue your postoperative care in the Same Day Surgery department.  After surgery, you can  help prevent lung complications by doing breathing exercises.  Take deep breaths and cough every 1-2 hours. Your doctor may order a device called an Incentive Spirometer to help you take deep breaths. When coughing or sneezing, hold a pillow firmly against your incision with both hands. This is called "splinting." Doing this helps protect your incision. It also decreases belly discomfort.  Surgery Visitation Policy:  Patients undergoing a surgery or procedure may have two family members or support persons with them as long as the person is not COVID-19 positive or experiencing its symptoms.   Inpatient Visitation:    Visiting hours are 7 a.m. to 8 p.m. Up to four visitors are allowed at one time in a patient room. The visitors may rotate out with other people during the day. One designated support person (adult) may remain overnight.  Please call the Pre-admissions Testing Dept. at [phone] if you have any questions about these instructions.     Preparing for Surgery with CHLORHEXIDINE  GLUCONATE (CHG) Soap  Chlorhexidine  Gluconate (CHG) Soap  o An antiseptic cleaner that kills germs and bonds with the skin to continue killing germs even after washing  o Used for showering the night before surgery and morning of surgery  Before surgery, you can play an important role by reducing the number of germs on your skin.  CHG (Chlorhexidine  gluconate) soap is an antiseptic cleanser which kills germs and bonds with the skin to continue killing germs even after washing.  Please do not use if you have an allergy to CHG or antibacterial soaps. If your skin becomes reddened/irritated stop using the CHG.  1. Shower the NIGHT BEFORE SURGERY and the MORNING OF SURGERY with CHG soap.  2. If you choose to wash your hair, wash your hair first as usual with your normal shampoo.  3. After shampooing, rinse your hair and body thoroughly to remove the shampoo.  4. Use CHG as you would any other  liquid soap. You can apply CHG directly to the skin and wash gently with a scrungie or a clean washcloth.  5. Apply the CHG soap to your body only from the neck down. Do not use on open wounds or open sores. Avoid contact with your eyes, ears, mouth, and genitals (private parts). Wash face and genitals (private parts) with your normal soap.  6. Wash thoroughly, paying special attention to the area where your surgery will be performed.  7. Thoroughly rinse your body with warm water.  8. Do not shower/wash with your normal soap after using and rinsing off the CHG soap.  9. Pat yourself dry with a clean towel.  10. Wear clean pajamas to bed the night before surgery.  12. Place clean sheets on your bed the night of your first shower and do not sleep with pets.  13. Shower again with the CHG soap on the day of surgery prior to arriving at the hospital.  14. Do not apply any deodorants/lotions/powders.  15. Please wear clean clothes to the hospital.

## 2023-10-21 ENCOUNTER — Encounter: Payer: Self-pay | Admitting: Cardiovascular Disease

## 2023-10-21 ENCOUNTER — Telehealth: Payer: Self-pay | Admitting: *Deleted

## 2023-10-21 ENCOUNTER — Telehealth: Payer: Self-pay | Admitting: Cardiovascular Disease

## 2023-10-21 NOTE — Telephone Encounter (Signed)
 LVMFCB to schedule preop TELE visit, OK to schedule patient in provider use slot on 1/13.

## 2023-10-21 NOTE — Telephone Encounter (Signed)
   Pre-operative Risk Assessment    Patient Name: Jasmine Larsen  DOB: 08/30/1966 MRN: [id number]   Date of last office visit: 10/4/204 Date of next office visit: NONE FOLLOW UP AS NEEDED   Request for Surgical Clearance    Procedure:   INCISION AND REMOVAL OF RIGHT FOREARM MASS  Date of Surgery:  Clearance 10/28/23                                Surgeon:  DR. NORLEEN MALTOS Surgeon's Group or Practice Name:   Phone number:  [phone] Fax number:  [phone]   Type of Clearance Requested:   - Medical    Type of Anesthesia:  General    Additional requests/questions:    [facility]   10/21/2023, 9:26 AM

## 2023-10-21 NOTE — Telephone Encounter (Signed)
 Pt is returning nurses phone call. Please advise

## 2023-10-21 NOTE — Telephone Encounter (Signed)
 Pt has been scheduled for a tele visit, 10/26/23 10:40.  Consent on file / medications reconciled.

## 2023-10-21 NOTE — Telephone Encounter (Signed)
 Patient is returning CMA's call to setup tele-visit. She states it's okay to speak with her daughter to schedule if she does not answer when calling back.   Please advise.

## 2023-10-21 NOTE — Telephone Encounter (Signed)
 Pt scheduled for tele visit, 10/26/23.  Consent on tile / was reconciling medications and the call dropped.  Called back and went straight to voicemail.     Patient Consent for Virtual Visit        Jasmine Larsen has provided verbal consent on 10/21/2023 for a virtual visit (video or telephone).   CONSENT FOR VIRTUAL VISIT FOR:  Jasmine Larsen  By participating in this virtual visit I agree to the following:  I hereby voluntarily request, consent and authorize [facility] and its employed or contracted physicians, physician assistants, nurse practitioners or other licensed health care professionals (the Practitioner), to provide me with telemedicine health care services (the "Services) as deemed necessary by the treating Practitioner. I acknowledge and consent to receive the Services by the Practitioner via telemedicine. I understand that the telemedicine visit will involve communicating with the Practitioner through live audiovisual communication technology and the disclosure of certain medical information by electronic transmission. I acknowledge that I have been given the opportunity to request an in-person assessment or other available alternative prior to the telemedicine visit and am voluntarily participating in the telemedicine visit.  I understand that I have the right to withhold or withdraw my consent to the use of telemedicine in the course of my care at any time, without affecting my right to future care or treatment, and that the Practitioner or I may terminate the telemedicine visit at any time. I understand that I have the right to inspect all information obtained and/or recorded in the course of the telemedicine visit and may receive copies of available information for a reasonable fee.  I understand that some of the potential risks of receiving the Services via telemedicine include:  Delay or interruption in medical evaluation due to technological equipment failure or  disruption; Information transmitted may not be sufficient (e.g. poor resolution of images) to allow for appropriate medical decision making by the Practitioner; and/or  In rare instances, security protocols could fail, causing a breach of personal health information.  Furthermore, I acknowledge that it is my responsibility to provide information about my medical history, conditions and care that is complete and accurate to the best of my ability. I acknowledge that Practitioner's advice, recommendations, and/or decision may be based on factors not within their control, such as incomplete or inaccurate data provided by me or distortions of diagnostic images or specimens that may result from electronic transmissions. I understand that the practice of medicine is not an exact science and that Practitioner makes no warranties or guarantees regarding treatment outcomes. I acknowledge that a copy of this consent can be made available to me via my patient portal [facility] MyChart), or I can request a printed copy by calling the office of [facility].    I understand that my insurance will be billed for this visit.   I have read or had this consent read to me. I understand the contents of this consent, which adequately explains the benefits and risks of the Services being provided via telemedicine.  I have been provided ample opportunity to ask questions regarding this consent and the Services and have had my questions answered to my satisfaction. I give my informed consent for the services to be provided through the use of telemedicine in my medical care

## 2023-10-21 NOTE — Telephone Encounter (Signed)
 Attempted to return pt's call (from separate encounter), LVMFCB

## 2023-10-21 NOTE — Telephone Encounter (Signed)
-----   Message from Dorise CHARLENA Pereyra sent at 10/21/2023  8:17 AM EST ----- Regarding: Request for pre-operative cardiac clearance Request for pre-operative cardiac clearance:  1. What type of surgery is being performed?  INCISION AND REMOVAL OF RIGHT FOREARM MASS  2. When is this surgery scheduled?10/28/2023  3. Type of clearance being requested (medical, pharmacy, both)? MEDICAL   4. Are there any medications that need to be held prior to surgery? NONE  5. Practice name and name of physician performing surgery?  Performing surgeon: Dr. Norleen Maltos, MD Requesting clearance: Dorise Pereyra, FNP-C    6. Anesthesia type (none, local, MAC, general)? GENERAL  7. What is the office phone and fax number?   Phone: [phone] Fax: [phone]  ATTENTION: Unable to create telephone message as per your standard workflow. Directed by [facility] providers to send requests for cardiac clearance to this pool for appropriate distribution to provider covering pre-operative clearances.   Dorise Pereyra, MSN, APRN, FNP-C, CEN [facility]  Peri-operative Services Nurse Practitioner Phone: [phone] 10/21/23 8:17 AM

## 2023-10-21 NOTE — Telephone Encounter (Signed)
 Error

## 2023-10-21 NOTE — Telephone Encounter (Signed)
   Name: Jasmine Larsen  DOB: 1966-02-12  MRN: [id number]  Primary Cardiologist: None   Preoperative team, please contact this patient and set up a phone call appointment for further preoperative risk assessment. Please obtain consent and complete medication review. Thank you for your help.  Okay to put in provider slot on Monday 1/13.   I confirm that guidance regarding antiplatelet and oral anticoagulation therapy has been completed and, if necessary, noted below.  None requested.  I also confirmed the patient resides in the state of [place] . As per [facility] Medical Board telemedicine laws, the patient must reside in the state in which the provider is licensed.   Barnie Hila, NP 10/21/2023, 9:53 AM [facility]

## 2023-10-21 NOTE — Telephone Encounter (Signed)
-----   Message from Jasmine Larsen sent at 10/21/2023  8:17 AM EST ----- Regarding: Request for pre-operative cardiac clearance Request for pre-operative cardiac clearance:  1. What type of surgery is being performed?  INCISION AND REMOVAL OF RIGHT FOREARM MASS  2. When is this surgery scheduled?10/28/2023  3. Type of clearance being requested (medical, pharmacy, both)? MEDICAL   4. Are there any medications that need to be held prior to surgery? NONE  5. Practice name and name of physician performing surgery?  Performing surgeon: Dr. Norleen Maltos, MD Requesting clearance: Jasmine Pereyra, FNP-C    6. Anesthesia type (none, local, MAC, general)? GENERAL  7. What is the office phone and fax number?   Phone: [phone] Fax: [phone]  ATTENTION: Unable to create telephone message as per your standard workflow. Directed by [facility] providers to send requests for cardiac clearance to this pool for appropriate distribution to provider covering pre-operative clearances.   Jasmine Pereyra, MSN, APRN, FNP-C, CEN [facility]  Peri-operative Services Nurse Practitioner Phone: [phone] 10/21/23 8:17 AM

## 2023-10-23 NOTE — Progress Notes (Signed)
 Virtual Visit via Telephone Note   Because of Jasmine Larsen's co-morbid illnesses, she is at least at moderate risk for complications without adequate follow up.  This format is felt to be most appropriate for this patient at this time.  The patient did not have access to video technology/had technical difficulties with video requiring transitioning to audio format only (telephone).  All issues noted in this document were discussed and addressed.  No physical exam could be performed with this format.  Please refer to the patient's chart for her consent to telehealth for Jasmine Larsen.  Evaluation Performed:  Preoperative cardiovascular risk assessment _____________   Date:  10/26/2023   Patient ID:  Jasmine Larsen, DOB 02-Oct-1966, MRN [id number] Patient Location:  Home Provider location:   Office  Primary Care Provider:  Hope Merle, MD Primary Cardiologist: Dr. Darron  Chief Complaint / Patient Profile   58 y.o. y/o female with a h/o chronic diastolic CHF, morbid obesity, COPD, ongoing tobacco abuse (2 packs a day),, diabetes, hyperlipidemia.    She is pending incision and removal of right forearm mass by Dr. Norleen Maltos on 10/28/2023 and presents today for telephonic preoperative cardiovascular risk assessment.  History of Present Illness    Jasmine Larsen is a 58 y.o. female who presents via audio/video conferencing for a telehealth visit today.  Pt was last seen in cardiology clinic on 07/21/2023 by Dr. Darron.  At that time Jasmine Larsen was doing well but continued chronic complaints of shortness of breath due to extensive tobacco use and COPD.Jasmine Larsen  She was noted to have chronic lower extremity edema likely lymphedema, she was to be seen as needed only on follow-up.  The patient is now pending procedure as outlined above. Since her last visit, she stays busy with two young grandchildren.  She denies chest pain, dizziness, near syncope. She continues to have some breathing  issues related to her COPD. She is medically compliant.   Past Medical History    Past Medical History:  Diagnosis Date   Acute respiratory failure with hypoxia (HCC)    Anxiety    Asthma    CHF (congestive heart failure) (HCC)    COPD (chronic obstructive pulmonary disease) (HCC)    Depression    Diabetes mellitus without complication (HCC)    HLD (hyperlipidemia)    Miscarriage    Multiple gastric ulcers    Pneumonia due to COVID-19 virus    Sepsis due to pneumonia [facility])    Past Surgical History:  Procedure Laterality Date   CESAREAN SECTION     x3   COLONOSCOPY WITH PROPOFOL  N/A 09/21/2023   Procedure: COLONOSCOPY WITH PROPOFOL ;  Surgeon: Unk Corinn Skiff, MD;  Location: [facility] ENDOSCOPY;  Service: Gastroenterology;  Laterality: N/A;   POLYPECTOMY  09/21/2023   Procedure: POLYPECTOMY;  Surgeon: Unk Corinn Skiff, MD;  Location: [facility] ENDOSCOPY;  Service: Gastroenterology;;    Allergies  Allergies  Allergen Reactions   Cymbalta  [Duloxetine  Hcl]     SI thoughts    Home Medications    Prior to Admission medications   Medication Sig Start Date End Date Taking? Authorizing Provider  Accu-Chek Softclix Lancets lancets 1 each by Other route daily. 08/13/23   Hope Merle, MD  acetaminophen  (TYLENOL ) 650 MG CR tablet Take 1,300 mg by mouth every 8 (eight) hours as needed for pain.    [provider]  albuterol  (VENTOLIN  HFA) 108 (90 Base) MCG/ACT inhaler Inhale 1-2 puffs into the lungs every 6 (  six) hours as needed for wheezing or shortness of breath. 08/28/23   Hope Merle, MD  blood glucose meter kit and supplies KIT Use up to 4 times daily. 08/17/23   Hope Merle, MD  Blood Glucose Monitoring Suppl DEVI 1 each by Does not apply route as directed. May substitute to any manufacturer covered by patient's insurance. 08/13/23   Hope Merle, MD  cyanocobalamin  (VITAMIN B12) 1000 MCG tablet Take 1 tablet (1,000 mcg total) by mouth daily. 08/09/23   Hope Merle, MD   dicyclomine  (BENTYL ) 20 MG tablet Take 20 mg by mouth 3 (three) times daily as needed for spasms.    [provider]  Fluticasone -Umeclidin-Vilant (TRELEGY ELLIPTA ) 100-62.5-25 MCG/ACT AEPB Inhale 1 puff into the lungs daily. 09/02/23   Hope Merle, MD  gabapentin  (NEURONTIN ) 400 MG capsule Take 1 capsule (400 mg total) by mouth 3 (three) times daily. 10/11/23 10/10/24  Hope Merle, MD  Glucose Blood (BLOOD GLUCOSE TEST STRIPS) STRP 1 each by In Vitro route daily. May substitute to any manufacturer covered by patient's insurance. 07/22/23   Wendee Lynwood HERO, NP  metFORMIN  (GLUCOPHAGE ) 500 MG tablet Take 1 tablet (500 mg total) by mouth 2 (two) times daily with a meal. 08/13/23   Hope Merle, MD  rosuvastatin  (CRESTOR ) 20 MG tablet Take 1 tablet (20 mg total) by mouth daily. Patient taking differently: Take 20 mg by mouth at bedtime. 08/13/23   Hope Merle, MD  Semaglutide , 1 MG/DOSE, 4 MG/3ML SOPN Inject 1 mg as directed once a week. 08/13/23   Hope Merle, MD  tiZANidine  (ZANAFLEX ) 4 MG tablet Take 1 tablet (4 mg total) by mouth at bedtime as needed for muscle spasms. 10/11/23   Hope Merle, MD  Vitamin D , Ergocalciferol , (DRISDOL ) 1.25 MG (50000 UNIT) CAPS capsule Take 1 capsule (50,000 Units total) by mouth every 7 (seven) days. 08/09/23   Hope Merle, MD    Physical Exam    Vital Signs:  Jasmine Larsen does not have vital signs available for review today.  Given telephonic nature of communication, physical exam is limited. AAOx3. NAD. Normal affect.  Speech and respirations are unlabored.  Accessory Clinical Findings    None  Assessment & Plan    1.  Preoperative Cardiovascular Risk Assessment:  \According to the Revised Cardiac Risk Index (RCRI), her Perioperative Risk of Major Cardiac Event is (%): 0.9  Her Functional Capacity in METs is: 7.68 according to the Duke Activity Status Index (DASI).   The patient was advised that if she develops new symptoms prior to  surgery to contact our office to arrange for a follow-up visit, and she verbalized understanding.  Patient verified that she is not on anticoagulation or antiplatelet medications.   A copy of this note will be routed to requesting surgeon.  Time:   Today, I have spent 10 minutes with the patient with telehealth technology discussing medical history, symptoms, and management plan.     Lamarr Satterfield, NP  10/26/2023, 10:44 AM

## 2023-10-26 ENCOUNTER — Ambulatory Visit: Payer: Medicare Other | Attending: Cardiology

## 2023-10-26 DIAGNOSIS — Z01818 Encounter for other preprocedural examination: Secondary | ICD-10-CM | POA: Diagnosis not present

## 2023-10-27 ENCOUNTER — Encounter: Payer: Self-pay | Admitting: Surgery

## 2023-10-27 MED ORDER — ORAL CARE MOUTH RINSE: 15.0000 mL | Freq: Once | OROMUCOSAL | Status: AC

## 2023-10-27 MED ORDER — CHLORHEXIDINE GLUCONATE 0.12 % MT SOLN
15.0000 mL | Freq: Once | OROMUCOSAL | Status: AC
  Administered 2023-10-28: 15 mL via OROMUCOSAL

## 2023-10-27 MED ORDER — SODIUM CHLORIDE 0.9 % IV SOLN: INTRAVENOUS | Status: DC

## 2023-10-27 MED ORDER — CEFAZOLIN SODIUM-DEXTROSE 2-4 GM/100ML-% IV SOLN
2.0000 g | INTRAVENOUS | Status: AC
  Administered 2023-10-28: 2 g via INTRAVENOUS
  Administered 2023-10-28: 1 g via INTRAVENOUS

## 2023-10-27 NOTE — Progress Notes (Signed)
 Perioperative / Anesthesia Services  Pre-Admission Testing Clinical Review / Pre-Operative Anesthesia Consult  Date: 10/27/23  Patient Demographics:  Name: Jasmine Larsen DOB: [birth year] MRN:   [id number]  Planned Surgical Procedure(s):    Case: [id number] Date/Time: 10/28/23 1205   Procedure: INCISION AND REMOVAL OF RIGHT FOREARM MASS (Right: Wrist)   Anesthesia type: Choice   Pre-op diagnosis: Forearm mass, right R22.31   Location: [facility] OR ROOM 02 / [facility]S [facility]   Surgeons: Edie Norleen PARAS, MD      NOTE: Available PAT nursing documentation and vital signs have been reviewed. Clinical nursing staff has updated patient's PMH/PSHx, current medication list, and drug allergies/intolerances to ensure comprehensive history available to assist in medical decision making as it pertains to the aforementioned surgical procedure and anticipated anesthetic course. Extensive review of available clinical information personally performed. [facility] PMH and PSHx updated with any diagnoses/procedures that  may have been inadvertently omitted during his intake with the pre-admission testing department's nursing staff.  Clinical Discussion:  Jasmine Larsen is a 58 y.o. female who is submitted for pre-surgical anesthesia review and clearance prior to her undergoing the above procedure. Patient is a Current Smoker (105.1 pack years). Pertinent PMH includes: HFpEF, angina, HTN, HLD, T2DM, COPD, asthma, GERD (no daily Tx), multiple gastric ulcers, peripheral edema, RIGHT forearm mass (?? lipoma), OA, lumbar DDD with spinal stenosis, anxiety, and depression.   Patient is followed by cardiology Marsa, MD). She was last seen in the cardiology clinic on 07/21/2023; notes reviewed. At the time of her clinic visit, patient doing well overall from a cardiovascular perspective. Patient with chronic shortness of breath that is reported to be stable and at baseline. She continues to smoke and has an  underlying COPD and asthma overlap diagnosis. Patient also with chronic peripheral edema that was felt to be consistent with lymph edema. Patient denied any chest pain, PND, orthopnea, palpitations, weakness, fatigue, vertiginous symptoms, or presyncope/syncope. Patient with a past medical history significant for cardiovascular diagnoses. Documented physical exam was grossly benign, providing no evidence of acute exacerbation and/or decompensation of the patient's known cardiovascular conditions.  Most recent TTE was performed on 08/12/2023 revealing a normal left ventricular systolic function with an EF of 55-60%, There were no regional wall motion abnormalities. Left ventricular diastolic Doppler parameters consistent with abnormal relaxation (G1DD). Right ventricular size and function was normal. All transvalvular gradients were noted to be normal providing no evidence suggestive of valvular stenosis. Aorta normal in size with no evidence of aneurysmal dilatation.  Blood pressure elevated at 156/70 mmHg; not on oral antihypertensives. Patient is on rosuvastatin  for his HLD diagnosis and ASCVD prevention. T2DM loosely controlled on currently prescribed regimen; last HgbA1c was 7.8% when checked on 08/06/2023. She does not have an OSAH diagnosis. Functional capacity somewhat limited by patients obesity, dyspnea, and other multiple medical comorbidities. With that said, she can complete her ADL/IADLs without significant cardiovascular limitations. Per the DASI, reported to be able to exceed 4 METS of physical activity without significant angina/anginal equivalent symptoms. No changes were made to her medication regimen during her visit with cardiology.  Patient scheduled to follow-up with outpatient cardiology on an as needed basis for ongoing care of her cardiovascular conditions. Jasmine Larsen is scheduled for an elective INCISION AND REMOVAL OF RIGHT FOREARM MASS (Right: Wrist) on 10/28/2023 with Dr.  Norleen Edie, MD.  Given patient's past medical history significant for cardiovascular diagnoses, presurgical cardiac clearance was sought by the  PAT team. Per cardiology, according to the Revised Cardiac Risk Index (RCRI), her Perioperative Risk of Major Cardiac Event is (%): 0.9, Her Functional Capacity in METs is: 7.68 according to the Duke Activity Status Index (DASI). Based ACC/AHA guidelines, the patient's past medical history, and the amount of time since her last clinic visit, this patient would be at an overall ACCEPTABLE risk for the planned procedure without further cardiovascular testing or intervention at this time.    In review of her medication reconciliation, the patient is not noted to be taking any type of anticoagulation or antiplatelet therapies that would need to be held during her perioperative course.  Patient denies previous perioperative complications with anesthesia in the past. In review her EMR, it is noted that patient underwent a general anesthetic course here at [facility] in 09/2023 without documented complications.      10/20/2023   10:31 AM 09/29/2023    1:10 PM 09/21/2023   10:49 AM  Vitals with BMI  Height 5' 4 5' 4   Weight 333 lbs 333 lbs   BMI 57.13 57.13   Systolic  138 117  Diastolic  90 95  Pulse  82    Providers/Specialists:  NOTE: Primary physician provider listed below. Patient may have been seen by APP or partner within same practice.   PROVIDER ROLE / SPECIALTY LAST OV  Poggi, Norleen PARAS, MD Orthopedics (Surgeon) 10/20/2023  Hope Merle, MD Primary Care Provider 08/16/2023  Darron Grass, MD Cardiology 07/21/2023; update call with preop APP on 10/26/2023  Malka Domino, MD Pulmonary Medicine 09/04/2023  Delores Pickles, NP-C Vascular Surgery 09/29/2023   Allergies:   Allergies  Allergen Reactions   Cymbalta  [Duloxetine  Hcl]     SI thoughts   Current Home Medications:   No current  facility-administered medications for this encounter.    acetaminophen  (TYLENOL ) 650 MG CR tablet   albuterol  (VENTOLIN  HFA) 108 (90 Base) MCG/ACT inhaler   cyanocobalamin  (VITAMIN B12) 1000 MCG tablet   dicyclomine  (BENTYL ) 20 MG tablet   Fluticasone -Umeclidin-Vilant (TRELEGY ELLIPTA ) 100-62.5-25 MCG/ACT AEPB   gabapentin  (NEURONTIN ) 400 MG capsule   metFORMIN  (GLUCOPHAGE ) 500 MG tablet   rosuvastatin  (CRESTOR ) 20 MG tablet   Semaglutide , 1 MG/DOSE, 4 MG/3ML SOPN   tiZANidine  (ZANAFLEX ) 4 MG tablet   Vitamin D , Ergocalciferol , (DRISDOL ) 1.25 MG (50000 UNIT) CAPS capsule   Accu-Chek Softclix Lancets lancets   blood glucose meter kit and supplies KIT   Blood Glucose Monitoring Suppl DEVI   Glucose Blood (BLOOD GLUCOSE TEST STRIPS) STRP   History:   Past Medical History:  Diagnosis Date   (HFpEF) heart failure with preserved ejection fraction (HCC)    a.) TTE 07/14/2016: EF 65-70%, no RWMAs, norm RVSF, mild MR; b.) TTE 08/12/2023: EF 55-60%, no RWMAs, G1DD, norm RVSF   Acute respiratory failure with hypoxia (HCC)    Anxiety    Asthma    Chest pain    COPD (chronic obstructive pulmonary disease) (HCC)    DDD (degenerative disc disease), lumbar    Depression    Hepatic steatosis    History of 2019 novel coronavirus disease (COVID-19) 07/19/2020   HLD (hyperlipidemia)    HTN (hypertension)    Lumbosacral spinal stenosis    Miscarriage    Morbid obesity (HCC)    Multiple gastric ulcers    Osteoarthritis    Peripheral edema    Pneumonia due to COVID-19 virus    Sepsis due to pneumonia (HCC)  Sigmoid polyp    T2DM (type 2 diabetes mellitus) (HCC)    Vitamin B12 deficiency    Vitamin D  deficiency    Past Surgical History:  Procedure Laterality Date   CESAREAN SECTION     x3   COLONOSCOPY WITH PROPOFOL  N/A 09/21/2023   Procedure: COLONOSCOPY WITH PROPOFOL ;  Surgeon: Unk Corinn Skiff, MD;  Location: [facility] ENDOSCOPY;  Service: Gastroenterology;  Laterality: N/A;    POLYPECTOMY  09/21/2023   Procedure: POLYPECTOMY;  Surgeon: Unk Corinn Skiff, MD;  Location: [facility] ENDOSCOPY;  Service: Gastroenterology;;   Family History  Problem Relation Age of Onset   Asthma Mother    Diabetes Mother    COPD Mother    Heart Problems Mother    Diabetes Brother    COPD Brother    Congestive Heart Failure Brother    Alcohol abuse Maternal Grandmother    Tuberculosis Maternal Grandmother    Lung cancer Maternal Grandmother    Social History   Tobacco Use   Smoking status: Every Day    Current packs/day: 2.50    Average packs/day: 2.5 packs/day for 42.0 years (105.1 ttl pk-yrs)    Types: Cigarettes    Start date: 29    Passive exposure: Past   Smokeless tobacco: Never  Substance Use Topics   Alcohol use: No   Pertinent Clinical Results:  LABS:   Lab Results  Component Value Date   WBC 7.6 08/06/2023   HGB 14.4 08/06/2023   HCT 45.1 08/06/2023   MCV 86.5 08/06/2023   PLT 200.0 08/06/2023   Lab Results  Component Value Date   NA 137 08/06/2023   K 4.4 08/06/2023   CO2 29 08/06/2023   GLUCOSE 104 (H) 08/06/2023   BUN 14 08/06/2023   CREATININE 0.59 08/06/2023   CALCIUM  9.0 08/06/2023   GFR 101.36 08/06/2023   GFRNONAA >60 05/02/2023   Lab Results  Component Value Date   HGBA1C 7.8 (H) 08/06/2023    ECG: Date: 07/21/2023  Time ECG obtained: 1453 PM Rate: 84 bpm Rhythm: normal sinus Axis (leads I and aVF): normal Intervals: PR 156 ms. QRS 82 ms. QTc 451 ms. ST segment and T wave changes: Borderline evidence of an age undetermined anterior infarct noted.  Comparison: Similar to previous tracing obtained on 04/27/2023 (ST with PACS); rate now controlled.    IMAGING / PROCEDURES: TRANSTHORACIC ECHOCARDIOGRAM performed on 08/12/2023 Left ventricular ejection fraction, by estimation, is 55 to 60%. The left ventricle has normal function. The left ventricle has no regional wall motion abnormalities. Left ventricular diastolic parameters  are consistent with Grade I diastolic dysfunction (impaired relaxation).  Right ventricular systolic function is normal. The right ventricular size is normal. Tricuspid regurgitation signal is inadequate for assessing PA pressure.  The mitral valve is normal in structure. No evidence of mitral valve regurgitation. No evidence of mitral stenosis.  The aortic valve is normal in structure. Aortic valve regurgitation is not visualized. No aortic stenosis is present.  The inferior vena cava is normal in size with greater than 50% respiratory variability, suggesting right atrial pressure of 3 mmHg.   DIAGNOSTIC RADIOGRAPHS OF CHEST 2 VIEWS performed on 08/12/2023 The heart size and mediastinal contours are within normal limits.  Both lungs are clear.  The visualized skeletal structures are unremarkable No active cardiopulmonary disease.  MR FOREARM RIGHT W WO CONTRAST performed on 06/04/2023 Mild tendinosis of the extensor carpi ulnaris tendon. A 6 mm spiculated low signal area with enhancement in the subcutaneous fat along the  volar ulnar aspect of the mid-distal forearm concerning for a small area of fat necrosis or scarring.   Impression and Plan:  Jasmine Larsen has been referred for pre-anesthesia review and clearance prior to her undergoing the planned anesthetic and procedural courses. Available labs, pertinent testing, and imaging results were personally reviewed by me in preparation for upcoming operative/procedural course. [facility] medical record has been updated following extensive record review and patient interview with PAT staff.   This patient has been appropriately cleared by cardiology with an overall ACCEPTABLE risk of experiencing significant perioperative cardiovascular complications. Based on clinical review performed today (10/27/23), barring any significant acute changes in the patient's overall condition, it is anticipated that she will be able to proceed with the planned  surgical intervention. Any acute changes in clinical condition may necessitate her procedure being postponed and/or cancelled. Patient will meet with anesthesia team (MD and/or CRNA) on the day of her procedure for preoperative evaluation/assessment. Questions regarding anesthetic course will be fielded at that time.   Pre-surgical instructions were reviewed with the patient during his PAT appointment, and questions were fielded to satisfaction by PAT clinical staff. She has been instructed on which medications that she will need to hold prior to surgery, as well as the ones that have been deemed safe/appropriate to take on the day of his procedure. As part of the general education provided by PAT, patient made aware both verbally and in writing, that she would need to abstain from the use of any illegal substances during his perioperative course. She was advised that failure to follow the provided instructions could necessitate case cancellation or result in serious perioperative complications up to and including death. Patient encouraged to contact PAT and/or her surgeon's office to discuss any questions or concerns that may arise prior to surgery; verbalized understanding.   Dorise Pereyra, MSN, APRN, FNP-C, CEN [facility]  Perioperative Services Nurse Practitioner Phone: [phone] Fax: [phone] 10/27/23 11:56 AM  NOTE: This note has been prepared using Dragon dictation software. Despite my best ability to proofread, there is always the potential that unintentional transcriptional errors may still occur from this process.

## 2023-10-28 ENCOUNTER — Encounter
Admission: RE | Disposition: A | Discharge status: Home / Self Care | Payer: Self-pay | Source: Home / Self Care | Attending: Surgery

## 2023-10-28 ENCOUNTER — Other Ambulatory Visit: Payer: Self-pay

## 2023-10-28 ENCOUNTER — Ambulatory Visit: Payer: Medicare Other | Admitting: Urgent Care

## 2023-10-28 ENCOUNTER — Encounter: Payer: Self-pay | Admitting: Surgery

## 2023-10-28 ENCOUNTER — Ambulatory Visit
Admission: RE | Admit: 2023-10-28 | Discharge: 2023-10-28 | Disposition: A | Discharge status: Home / Self Care | Payer: Medicare Other | Attending: Surgery | Admitting: Surgery

## 2023-10-28 DIAGNOSIS — Z7985 Long-term (current) use of injectable non-insulin antidiabetic drugs: Secondary | ICD-10-CM | POA: Diagnosis not present

## 2023-10-28 DIAGNOSIS — Z6841 Body Mass Index (BMI) 40.0 and over, adult: Secondary | ICD-10-CM | POA: Insufficient documentation

## 2023-10-28 DIAGNOSIS — F419 Anxiety disorder, unspecified: Secondary | ICD-10-CM | POA: Diagnosis not present

## 2023-10-28 DIAGNOSIS — Z79899 Other long term (current) drug therapy: Secondary | ICD-10-CM | POA: Insufficient documentation

## 2023-10-28 DIAGNOSIS — K279 Peptic ulcer, site unspecified, unspecified as acute or chronic, without hemorrhage or perforation: Secondary | ICD-10-CM | POA: Diagnosis not present

## 2023-10-28 DIAGNOSIS — Z7984 Long term (current) use of oral hypoglycemic drugs: Secondary | ICD-10-CM | POA: Diagnosis not present

## 2023-10-28 DIAGNOSIS — Z833 Family history of diabetes mellitus: Secondary | ICD-10-CM | POA: Insufficient documentation

## 2023-10-28 DIAGNOSIS — J449 Chronic obstructive pulmonary disease, unspecified: Secondary | ICD-10-CM | POA: Diagnosis not present

## 2023-10-28 DIAGNOSIS — E1140 Type 2 diabetes mellitus with diabetic neuropathy, unspecified: Secondary | ICD-10-CM

## 2023-10-28 DIAGNOSIS — F17200 Nicotine dependence, unspecified, uncomplicated: Secondary | ICD-10-CM | POA: Diagnosis not present

## 2023-10-28 DIAGNOSIS — E6689 Other obesity not elsewhere classified: Secondary | ICD-10-CM | POA: Diagnosis not present

## 2023-10-28 DIAGNOSIS — E118 Type 2 diabetes mellitus with unspecified complications: Secondary | ICD-10-CM

## 2023-10-28 DIAGNOSIS — I5032 Chronic diastolic (congestive) heart failure: Secondary | ICD-10-CM | POA: Diagnosis not present

## 2023-10-28 DIAGNOSIS — F32A Depression, unspecified: Secondary | ICD-10-CM | POA: Insufficient documentation

## 2023-10-28 DIAGNOSIS — I110 Hypertensive heart disease with heart failure: Secondary | ICD-10-CM | POA: Diagnosis not present

## 2023-10-28 DIAGNOSIS — E119 Type 2 diabetes mellitus without complications: Secondary | ICD-10-CM | POA: Diagnosis not present

## 2023-10-28 DIAGNOSIS — M7989 Other specified soft tissue disorders: Secondary | ICD-10-CM | POA: Insufficient documentation

## 2023-10-28 DIAGNOSIS — E1169 Type 2 diabetes mellitus with other specified complication: Secondary | ICD-10-CM

## 2023-10-28 DIAGNOSIS — D1721 Benign lipomatous neoplasm of skin and subcutaneous tissue of right arm: Secondary | ICD-10-CM | POA: Insufficient documentation

## 2023-10-28 HISTORY — DX: Deficiency of other specified B group vitamins: E53.8

## 2023-10-28 HISTORY — DX: Chest pain, unspecified: R07.9

## 2023-10-28 HISTORY — DX: Essential (primary) hypertension: I10

## 2023-10-28 HISTORY — DX: Polyp of colon: K63.5

## 2023-10-28 HISTORY — DX: Unspecified diastolic (congestive) heart failure: I50.30

## 2023-10-28 HISTORY — DX: Unspecified osteoarthritis, unspecified site: M19.90

## 2023-10-28 HISTORY — DX: Other intervertebral disc degeneration, lumbar region without mention of lumbar back pain or lower extremity pain: M51.369

## 2023-10-28 HISTORY — DX: Type 2 diabetes mellitus without complications: E11.9

## 2023-10-28 HISTORY — DX: Vitamin D deficiency, unspecified: E55.9

## 2023-10-28 HISTORY — DX: Fatty (change of) liver, not elsewhere classified: K76.0

## 2023-10-28 HISTORY — PX: MASS EXCISION: SHX2000

## 2023-10-28 HISTORY — DX: Morbid (severe) obesity due to excess calories: E66.01

## 2023-10-28 HISTORY — DX: Spinal stenosis, lumbosacral region: M48.07

## 2023-10-28 HISTORY — DX: Localized edema: R60.0

## 2023-10-28 LAB — GLUCOSE, CAPILLARY
Glucose-Capillary: 152 mg/dL — ABNORMAL HIGH (ref 70–99)
Glucose-Capillary: 151 mg/dL — ABNORMAL HIGH (ref 70–99)

## 2023-10-28 SURGERY — EXCISION MASS
Anesthesia: General | Site: Wrist | Laterality: Right

## 2023-10-28 MED ORDER — BUPIVACAINE LIPOSOME 1.3 % IJ SUSP
INTRAMUSCULAR | Status: DC | PRN
  Administered 2023-10-28: 10 mL

## 2023-10-28 MED ORDER — ROCURONIUM BROMIDE 100 MG/10ML IV SOLN
INTRAVENOUS | Status: DC | PRN
  Administered 2023-10-28: 10 mg via INTRAVENOUS
  Administered 2023-10-28: 60 mg via INTRAVENOUS

## 2023-10-28 MED ORDER — ONDANSETRON HCL 4 MG/2ML IJ SOLN
INTRAMUSCULAR | Status: DC | PRN
  Administered 2023-10-28 (×2): 4 mg via INTRAVENOUS

## 2023-10-28 MED ORDER — BUPIVACAINE HCL (PF) 0.5 % IJ SOLN
INTRAMUSCULAR | Status: AC
  Filled 2023-10-28: qty 30

## 2023-10-28 MED ORDER — ACETAMINOPHEN 10 MG/ML IV SOLN
INTRAVENOUS | Status: AC
  Filled 2023-10-28: qty 100

## 2023-10-28 MED ORDER — METOCLOPRAMIDE HCL 10 MG PO TABS: 5.0000 mg | ORAL_TABLET | Freq: Three times a day (TID) | ORAL | Status: DC | PRN

## 2023-10-28 MED ORDER — METOCLOPRAMIDE HCL 5 MG/ML IJ SOLN: 5.0000 mg | Freq: Three times a day (TID) | INTRAMUSCULAR | Status: DC | PRN

## 2023-10-28 MED ORDER — DROPERIDOL 2.5 MG/ML IJ SOLN: 0.6250 mg | Freq: Once | INTRAMUSCULAR | Status: DC | PRN

## 2023-10-28 MED ORDER — CHLORHEXIDINE GLUCONATE 0.12 % MT SOLN
OROMUCOSAL | Status: AC
  Filled 2023-10-28: qty 15

## 2023-10-28 MED ORDER — CEFAZOLIN SODIUM-DEXTROSE 2-4 GM/100ML-% IV SOLN
INTRAVENOUS | Status: AC
  Filled 2023-10-28: qty 100

## 2023-10-28 MED ORDER — DEXMEDETOMIDINE HCL IN NACL 200 MCG/50ML IV SOLN
INTRAVENOUS | Status: DC | PRN
  Administered 2023-10-28: 8 ug via INTRAVENOUS
  Administered 2023-10-28: 12 ug via INTRAVENOUS

## 2023-10-28 MED ORDER — BUPIVACAINE LIPOSOME 1.3 % IJ SUSP
INTRAMUSCULAR | Status: AC
  Filled 2023-10-28: qty 10

## 2023-10-28 MED ORDER — BUPIVACAINE HCL (PF) 0.5 % IJ SOLN
INTRAMUSCULAR | Status: DC | PRN
  Administered 2023-10-28: 10 mL

## 2023-10-28 MED ORDER — FENTANYL CITRATE (PF) 100 MCG/2ML IJ SOLN
INTRAMUSCULAR | Status: DC | PRN
  Administered 2023-10-28 (×2): 50 ug via INTRAVENOUS

## 2023-10-28 MED ORDER — DEXAMETHASONE SODIUM PHOSPHATE 10 MG/ML IJ SOLN
INTRAMUSCULAR | Status: DC | PRN
  Administered 2023-10-28: 4 mg via INTRAVENOUS

## 2023-10-28 MED ORDER — KETOROLAC TROMETHAMINE 30 MG/ML IJ SOLN
30.0000 mg | Freq: Once | INTRAMUSCULAR | Status: AC
  Administered 2023-10-28: 30 mg via INTRAVENOUS

## 2023-10-28 MED ORDER — SUCCINYLCHOLINE CHLORIDE 200 MG/10ML IV SOSY
PREFILLED_SYRINGE | INTRAVENOUS | Status: DC | PRN
  Administered 2023-10-28: 120 mg via INTRAVENOUS

## 2023-10-28 MED ORDER — GLYCOPYRROLATE 0.2 MG/ML IJ SOLN
INTRAMUSCULAR | Status: DC | PRN
  Administered 2023-10-28: .2 mg via INTRAVENOUS

## 2023-10-28 MED ORDER — FENTANYL CITRATE (PF) 100 MCG/2ML IJ SOLN
INTRAMUSCULAR | Status: AC
  Filled 2023-10-28: qty 2

## 2023-10-28 MED ORDER — MIDAZOLAM HCL 2 MG/2ML IJ SOLN
INTRAMUSCULAR | Status: AC
  Filled 2023-10-28: qty 2

## 2023-10-28 MED ORDER — ONDANSETRON HCL 4 MG PO TABS: 4.0000 mg | ORAL_TABLET | Freq: Four times a day (QID) | ORAL | Status: DC | PRN

## 2023-10-28 MED ORDER — SODIUM CHLORIDE 0.9 % IV SOLN: INTRAVENOUS | Status: DC

## 2023-10-28 MED ORDER — ACETAMINOPHEN 325 MG PO TABS: 325.0000 mg | ORAL_TABLET | Freq: Four times a day (QID) | ORAL | Status: DC | PRN

## 2023-10-28 MED ORDER — FENTANYL CITRATE (PF) 100 MCG/2ML IJ SOLN: 25.0000 ug | INTRAMUSCULAR | Status: DC | PRN

## 2023-10-28 MED ORDER — PROPOFOL 10 MG/ML IV BOLUS
INTRAVENOUS | Status: DC | PRN
  Administered 2023-10-28: 200 mg via INTRAVENOUS

## 2023-10-28 MED ORDER — MIDAZOLAM HCL 2 MG/2ML IJ SOLN
INTRAMUSCULAR | Status: DC | PRN
  Administered 2023-10-28: 2 mg via INTRAVENOUS

## 2023-10-28 MED ORDER — SUGAMMADEX SODIUM 200 MG/2ML IV SOLN
INTRAVENOUS | Status: DC | PRN
  Administered 2023-10-28: 400 mg via INTRAVENOUS

## 2023-10-28 MED ORDER — ROSUVASTATIN CALCIUM 20 MG PO TABS: 20.0000 mg | ORAL_TABLET | Freq: Every evening | ORAL | Status: DC

## 2023-10-28 MED ORDER — ACETAMINOPHEN 10 MG/ML IV SOLN
INTRAVENOUS | Status: DC | PRN
  Administered 2023-10-28: 1000 mg via INTRAVENOUS

## 2023-10-28 MED ORDER — ONDANSETRON HCL 4 MG/2ML IJ SOLN: 4.0000 mg | Freq: Four times a day (QID) | INTRAMUSCULAR | Status: DC | PRN

## 2023-10-28 MED ORDER — LIDOCAINE HCL (CARDIAC) PF 100 MG/5ML IV SOSY
PREFILLED_SYRINGE | INTRAVENOUS | Status: DC | PRN
  Administered 2023-10-28: 100 mg via INTRAVENOUS

## 2023-10-28 MED ORDER — KETOROLAC TROMETHAMINE 30 MG/ML IJ SOLN
INTRAMUSCULAR | Status: AC
  Filled 2023-10-28: qty 1

## 2023-10-28 SURGICAL SUPPLY — 24 items
BNDG ELASTIC 2X5.8 VLCR NS LF (GAUZE/BANDAGES/DRESSINGS) IMPLANT
BNDG ELASTIC 3X5.8 VLCR NS LF (GAUZE/BANDAGES/DRESSINGS) IMPLANT
BNDG ESMARCH 4X12 STRL LF (GAUZE/BANDAGES/DRESSINGS) ×1 IMPLANT
CHLORAPREP W/TINT 26 (MISCELLANEOUS) ×1 IMPLANT
CORD BIP STRL DISP 12FT (MISCELLANEOUS) ×1 IMPLANT
CUFF TOURN SGL QUICK 18X4 (TOURNIQUET CUFF) IMPLANT
GAUZE SPONGE 4X4 12PLY STRL (GAUZE/BANDAGES/DRESSINGS) ×1 IMPLANT
GAUZE XEROFORM 1X8 LF (GAUZE/BANDAGES/DRESSINGS) ×1 IMPLANT
GLOVE BIO SURGEON STRL SZ8 (GLOVE) ×2 IMPLANT
GLOVE INDICATOR 8.0 STRL GRN (GLOVE) ×1 IMPLANT
GOWN STRL REUS W/ TWL LRG LVL3 (GOWN DISPOSABLE) ×1 IMPLANT
GOWN STRL REUS W/ TWL XL LVL3 (GOWN DISPOSABLE) ×1 IMPLANT
KIT TURNOVER KIT A (KITS) ×1 IMPLANT
MANIFOLD NEPTUNE II (INSTRUMENTS) ×1 IMPLANT
NS IRRIG 500ML POUR BTL (IV SOLUTION) ×1 IMPLANT
PACK EXTREMITY ARMC (MISCELLANEOUS) ×1 IMPLANT
SOL PREP PVP 2OZ (MISCELLANEOUS) ×1
SOLUTION PREP PVP 2OZ (MISCELLANEOUS) ×1 IMPLANT
STOCKINETTE IMPERVIOUS 9X36 MD (GAUZE/BANDAGES/DRESSINGS) ×1 IMPLANT
STRAP SAFETY 5IN WIDE (MISCELLANEOUS) ×1 IMPLANT
SUT PROLENE 4 0 PS 2 18 (SUTURE) ×1 IMPLANT
SUT VIC AB 3-0 SH 27X BRD (SUTURE) IMPLANT
TRAP FLUID SMOKE EVACUATOR (MISCELLANEOUS) ×1 IMPLANT
WATER STERILE IRR 500ML POUR (IV SOLUTION) ×1 IMPLANT

## 2023-10-28 NOTE — Discharge Instructions (Addendum)
 Orthopedic discharge instructions: May shower with intact OpSite dressing beginning Friday, 10/30/2023. Keep right hand elevated on pillows. Apply ice to affected area frequently. Take ibuprofen 600-800 mg TID with meals for 3-5 days, then as necessary. Take ES Tylenol  between doses of ibuprofen when needed.  Return for follow-up in 10-14 days or as scheduled.

## 2023-10-28 NOTE — H&P (Signed)
 History of Present Illness: Jasmine Larsen is a 58 y.o. who presents today for history and physical. She is to undergo surgery for removal of a mass to the right forearm. Since her last visit at the clinic there have been no improvement in her condition. Patient continues to have quite a bit of discomfort. Patient states that she is ready to proceed with surgery.  The patient presented to the clinical clinic orthopedics department on 08/24/2023 with a right forearm mass which she states has been present for the past several years. The patient denies any trauma or injury, she does state over the past 2 years she has noticed the size of the mass becoming larger. The patient does report moderate tenderness around the area. She states that when this area gets irritated she does experience an occasional burning discomfort in the fourth and fifth digits involving the right hand. The patient denies any elbow pain at today's visit. She has undergone an MRI scan of the forearm. She was evaluated by general surgery who referred her to orthopedics for further evaluation. The patient is right-hand dominant. No surgical history involving the right arm. She denies any open wounds or signs of infection. She denies any personal history of heart attack or stroke. She does have a history of COPD. She is a diabetic, most recent A1c was 7.8. The patient denies any history of blood clots   Past Medical History: Chicken pox  COPD (chronic obstructive pulmonary disease) (CMS/HHS-HCC)   Past Surgical History: CESAREAN SECTION   Past Family History: COPD Mother  Diabetes type II Mother  Heart disease Mother   Medications: acetaminophen  (TYLENOL ) 650 MG ER tablet Take 1,300 mg by mouth every 8 (eight) hours as needed  albuterol -budesonide (AIRSUPRA ) 90-80 mcg/actuation HFAA Inhale 1-2 inhalations into the lungs  cyanocobalamin  (VITAMIN B12) 1000 MCG tablet Take 1,000 mcg by mouth once daily  dicyclomine  (BENTYL ) 20 mg  tablet Take 20 mg by mouth 3 (three) times daily as needed (FOR SPASMS)  ergocalciferol , vitamin D2, 1,250 mcg (50,000 unit) capsule Take 50,000 Units by mouth every 7 (seven) days  gabapentin  (NEURONTIN ) 300 MG capsule Take 300 mg by mouth 3 (three) times daily  metFORMIN  (GLUCOPHAGE ) 500 MG tablet Take 500 mg by mouth 2 (two) times daily with meals  ondansetron  (ZOFRAN ) 4 MG tablet 4 mg every 8 (eight) hours as needed for Vomiting or Nausea  rosuvastatin  (CRESTOR ) 5 MG tablet Take 5 mg by mouth once daily  semaglutide  (OZEMPIC ) 1 mg/dose (4 mg/3 mL) pen injector Inject 1 mg subcutaneously once a week  tiZANidine  (ZANAFLEX ) 2 MG tablet Take 2 mg by mouth at bedtime as needed for Muscle spasms  VENTOLIN  HFA 90 mcg/actuation inhaler Inhale 2 Inhalations into the lungs every 6 (six) hours as needed   Allergies: Duloxetine  Hcl Hallucination  SI thoughts  Hydrocodone  Headache   Review of Systems A comprehensive 14 point ROS was performed, reviewed, and the pertinent orthopaedic findings are documented in the HPI.  Physical Exam: BP (!) 150/90 (BP Location: Left upper arm, Patient Position: Sitting, BP Cuff Size: Large Adult)  Ht 162.6 cm (5\' 4" )  Wt (!) 151.1 kg (333 lb 3.2 oz)  BMI 57.19 kg/m   General: Well-developed well-nourished female seen in no acute distress. Presents today in a wheelchair  HEENT: Atraumatic,normocephalic. Pupils are equal and reactive to light. Oropharynx is clear with moist mucosa  Lungs: Clear to auscultation bilaterally   Cardiovascular: Regular rate and rhythm. Normal S1, S2. No murmurs. No  appreciable gallops or rubs. Peripheral pulses are palpable.  Abdomen: Soft, non-tender, nondistended. Bowel sounds present  Extremity: Skin examination of the right arm does reveal a mass along the midportion of the right forearm along the dorsal, ulnar aspect of the arm. The mass appears to be approximately 2-1/2 inches in length and approximately 1.5 inches in  width. The mass is soft does not appear to be movable. Does not feel cystic at this time. There is no skin breakdown around the area, no redness or open wound. The patient is able to fully flex and extend the right elbow without discomfort at today's visit, she does not have any significant pain with palpation along the cubital tunnel at today's visit. The patient is able to make a full composite fist at today's visit. With palpation over this area she does report some sharp discomfort that she experiences in the fourth and fifth digits involving the right hand   Neurological: The patient is alert and oriented Sensation to light touch appears to be intact and within normal limits Gross motor strength appeared to be equal to 5/5  Vascular: Peripheral pulses felt to be palpable. Capillary refill appears to be intact and within normal limits  MRI OF THE RIGHT FOREARM WITHOUT AND WITH CONTRAST:  1. Mild tendinosis of the extensor carpi ulnaris tendon.  2. A 6 mm spiculated low signal area with enhancement in the  subcutaneous fat along the volar ulnar aspect of the mid-distal  forearm concerning for a small area of fat necrosis or scarring.   Impression: 1. Right forearm mass possible lipoma  Plan:  1. Treatment options were discussed today with the patient. 2. The patient does have a mass noted to the dorsal, ulnar aspect of the right forearm, she does have moderate tenderness with palpation over this area. 3. Dr. Daun Epstein evaluated the patient with me at today's visit. After a discussion of the risk and benefits the patient would like to proceed with a formal incision of right forearm mass. 4. Surgery will be scheduled with Dr. Daun Epstein in the future. This document will serve as a surgical history and physical for the patient. 5. The patient will follow-up per standard postop protocol. They can call the clinic they have any questions, new symptoms develop or symptoms worsen.  The procedure was  discussed with the patient, as were the potential risks (including bleeding, infection, nerve and/or blood vessel injury, persistent or recurrent pain, failure of the removal, reoccurrence of the forearm mass, need for further surgery, blood clots, strokes, heart attacks and/or arhythmias, pneumonia, etc.) and benefits. The patient states her understanding and wishes to proceed.     H&P reviewed and patient re-examined. No changes.

## 2023-10-28 NOTE — Op Note (Signed)
 10/28/2023  12:21 PM  Patient:   Jasmine Larsen  Pre-Op Diagnosis:   Subcutaneous mass, right forearm.  Post-Op Diagnosis:   Same  Procedure:   Excision of subcutaneous mass, right forearm.  Surgeon:   Lonnie Roberts, MD  Assistant:   None  Anesthesia:   GET  Findings:   As above.  Complications:   None  Fluids:   300 cc crystalloid  EBL:   1 cc  UOP:   None  TT:   20 minutes at 300 mmHg  Drains:   None  Closure:   3-0 Vicryl subcuticular sutures  Brief Clinical Note:   The patient is a 58 year old female with a history of a gradually enlarging soft tissue mass on the ulnar aspect of her right forearm which has become increasingly uncomfortable, especially when she rests her arm on a hard surface.  Her history examination consistent with a benign soft tissue mass of uncertain etiology.  Her preoperative MRI scan was inconclusive.  She presents at this time for excision of the soft tissue mass from her right forearm.  Procedure:   The patient was brought into the operating room and laid in the supine position.  After adequate general endotracheal intubation and anesthesia were obtained, the patient's right upper extremity was prepped with ChloraPrep solution before being draped sterilely.  Preoperative antibiotics were administered.  The a timeout was performed to verify the appropriate surgical site before the limb was exsanguinated with an Esmarch and the tourniquet inflated to 300 mmHg.  An approximately 4 to 5 cm incision was made longitudinally over the mass.  The incision was carried down through the subcutaneous tissues to expose the mass.  This was excised circumferentially and removed before being sent to pathology for histologic evaluation.    The wound was copiously irrigated with sterile saline solution before being closed using 3-0 Vicryl subcuticular sutures.  Benzoin and Steri-Strips were applied to the skin before a sterile occlusive bulky dressing was  applied to the wound.  The patient was then awakened, extubated, and returned to the recovery room in satisfactory condition after tolerating the procedure well.

## 2023-10-28 NOTE — Anesthesia Procedure Notes (Signed)
 Procedure Name: Intubation Date/Time: 10/28/2023 11:22 AM  Performed by: Niki Barter, CRNAPre-anesthesia Checklist: Patient identified, Emergency Drugs available, Suction available and Patient being monitored Patient Re-evaluated:Patient Re-evaluated prior to induction Oxygen Delivery Method: Circle system utilized Preoxygenation: Pre-oxygenation with 100% oxygen Induction Type: IV induction Ventilation: Mask ventilation without difficulty Laryngoscope Size: McGrath and 3 Grade View: Grade I Tube type: Oral Tube size: 7.0 mm Number of attempts: 1 Airway Equipment and Method: Stylet Placement Confirmation: ETT inserted through vocal cords under direct vision, positive ETCO2 and breath sounds checked- equal and bilateral Secured at: 21 cm Tube secured with: Tape Dental Injury: Teeth and Oropharynx as per pre-operative assessment  Difficulty Due To: Difficulty was anticipated and Difficult Airway- due to dentition Future Recommendations: Recommend- induction with short-acting agent, and alternative techniques readily available Comments: Atraumatic, w/o incident TFH CRNA

## 2023-10-28 NOTE — Anesthesia Preprocedure Evaluation (Signed)
 Anesthesia Evaluation  Patient identified by MRN, date of birth, ID band Patient awake    Reviewed: Allergy & Precautions, NPO status , Patient's Chart, lab work & pertinent test results  History of Anesthesia Complications Negative for: history of anesthetic complications  Airway Mallampati: III  TM Distance: >3 FB Neck ROM: full    Dental  (+) Edentulous Upper, Poor Dentition, Missing, Dental Advidsory Given   Pulmonary neg shortness of breath, asthma , COPD,  COPD inhaler, neg recent URI, Current Smoker   Pulmonary exam normal        Cardiovascular hypertension, (-) angina +CHF  (-) Past MI and (-) Cardiac Stents Normal cardiovascular exam(-) dysrhythmias (-) Valvular Problems/Murmurs  Echo 07/2023 IMPRESSIONS     1. Left ventricular ejection fraction, by estimation, is 55 to 60%. The  left ventricle has normal function. The left ventricle has no regional  wall motion abnormalities. Left ventricular diastolic parameters are  consistent with Grade I diastolic  dysfunction (impaired relaxation).   2. Right ventricular systolic function is normal. The right ventricular  size is normal. Tricuspid regurgitation signal is inadequate for assessing  PA pressure.   3. The mitral valve is normal in structure. No evidence of mitral valve  regurgitation. No evidence of mitral stenosis.   4. The aortic valve is normal in structure. Aortic valve regurgitation is  not visualized. No aortic stenosis is present.   5. The inferior vena cava is normal in size with greater than 50%  respiratory variability, suggesting right atrial pressure of 3 mmHg.     Neuro/Psych neg Seizures PSYCHIATRIC DISORDERS Anxiety Depression     Neuromuscular disease    GI/Hepatic Neg liver ROS, PUD,,,  Endo/Other  diabetes  Class 4 obesity  Renal/GU negative Renal ROS  negative genitourinary   Musculoskeletal   Abdominal   Peds  Hematology negative  hematology ROS (+)   Anesthesia Other Findings Past Medical History: No date: Asthma No date: CHF (congestive heart failure) (HCC) No date: COPD (chronic obstructive pulmonary disease) (HCC) No date: Diabetes mellitus without complication (HCC) No date: Miscarriage No date: Multiple gastric ulcers  Past Surgical History: No date: CESAREAN SECTION     Comment:  x3  BMI    Body Mass Index: 65.03 kg/m      Reproductive/Obstetrics negative OB ROS                             Anesthesia Physical Anesthesia Plan  ASA: 3  Anesthesia Plan: General   Post-op Pain Management:    Induction: Intravenous  PONV Risk Score and Plan: 3 and Ondansetron , Dexamethasone , Midazolam  and Treatment may vary due to age or medical condition  Airway Management Planned: Oral ETT  Additional Equipment:   Intra-op Plan:   Post-operative Plan: Extubation in OR  Informed Consent: I have reviewed the patients History and Physical, chart, labs and discussed the procedure including the risks, benefits and alternatives for the proposed anesthesia with the patient or authorized representative who has indicated his/her understanding and acceptance.     Dental Advisory Given  Plan Discussed with: Anesthesiologist, CRNA and Surgeon  Anesthesia Plan Comments: (Patient consented for risks of anesthesia including but not limited to:  - adverse reactions to medications - risk of airway placement if required - damage to eyes, teeth, lips or other oral mucosa - nerve damage due to positioning  - sore throat or hoarseness - Damage to heart, brain, nerves, lungs, other  parts of body or loss of life  Patient voiced understanding and assent.)        Anesthesia Quick Evaluation

## 2023-10-28 NOTE — Transfer of Care (Signed)
 Immediate Anesthesia Transfer of Care Note  Patient: Jasmine Larsen  Procedure(s) Performed: INCISION AND REMOVAL OF RIGHT FOREARM MASS (Right: Wrist)  Patient Location: PACU  Anesthesia Type:General  Level of Consciousness: drowsy and patient cooperative  Airway & Oxygen Therapy: Patient Spontanous Breathing and Patient connected to face mask oxygen  Post-op Assessment: Report given to RN and Post -op Vital signs reviewed and stable  Post vital signs: Reviewed and stable  Last Vitals:  Vitals Value Taken Time  BP 131/66 10/28/23 1215  Temp 36 C 10/28/23 1211  Pulse 79 10/28/23 1223  Resp 20 10/28/23 1223  SpO2 95 % 10/28/23 1223  Vitals shown include unfiled device data.  Last Pain:  Vitals:   10/28/23 1211  TempSrc:   PainSc: 0-No pain         Complications:  Encounter Notable Events  Notable Event Outcome Phase Comment  Difficult to intubate - expected  Intraprocedure Filed from anesthesia note documentation.

## 2023-10-29 ENCOUNTER — Encounter: Payer: Self-pay | Admitting: Surgery

## 2023-10-29 LAB — SURGICAL PATHOLOGY

## 2023-10-30 NOTE — Anesthesia Postprocedure Evaluation (Signed)
Anesthesia Post Note  Patient: [name]  Procedure(s) Performed: INCISION AND REMOVAL OF RIGHT FOREARM MASS (Right: Wrist)  Patient location during evaluation: PACU Anesthesia Type: General Level of consciousness: awake and alert Pain management: pain level controlled Vital Signs Assessment: post-procedure vital signs reviewed and stable Respiratory status: spontaneous breathing, nonlabored ventilation, respiratory function stable and patient connected to nasal cannula oxygen Cardiovascular status: blood pressure returned to baseline and stable Postop Assessment: no apparent nausea or vomiting Anesthetic complications: yes   Encounter Notable Events  Notable Event Outcome Phase Comment  Difficult to intubate - expected  Intraprocedure Filed from anesthesia note documentation.     Last Vitals:  Vitals:   10/28/23 1300 10/28/23 1313  BP: 120/64 (!) 147/65  Pulse: 78 72  Resp: 17 20  Temp: 36.4 C (!) 36.3 C  SpO2: 94% 96%    Last Pain:  Vitals:   10/28/23 1313  TempSrc: Temporal  PainSc: 0-No pain                 Lenard Simmer

## 2023-11-03 ENCOUNTER — Inpatient Hospital Stay: Admission: RE | Admit: 2023-11-03 | Payer: Medicare Other | Source: Ambulatory Visit

## 2023-11-10 ENCOUNTER — Telehealth: Payer: Self-pay | Admitting: Pulmonary Disease

## 2023-11-10 ENCOUNTER — Encounter: Payer: Self-pay | Admitting: Family Medicine

## 2023-11-10 NOTE — Telephone Encounter (Signed)
Dr. Larinda Buttery you saw this patient on 09/04/23 and order split night sleep study.  We received a note from [facility] the patient has not returned their calls to schedule sleep study. They will not make further attempts to schedule this patient

## 2023-11-11 ENCOUNTER — Ambulatory Visit: Payer: Self-pay | Admitting: Family Medicine

## 2023-11-11 ENCOUNTER — Other Ambulatory Visit: Payer: Self-pay

## 2023-11-11 ENCOUNTER — Emergency Department: Payer: Medicare Other

## 2023-11-11 ENCOUNTER — Encounter: Payer: Self-pay | Admitting: Family Medicine

## 2023-11-11 ENCOUNTER — Encounter: Payer: Self-pay | Admitting: *Deleted

## 2023-11-11 ENCOUNTER — Ambulatory Visit (INDEPENDENT_AMBULATORY_CARE_PROVIDER_SITE_OTHER): Payer: Medicare Other | Admitting: Family Medicine

## 2023-11-11 ENCOUNTER — Emergency Department
Admission: EM | Admit: 2023-11-11 | Discharge: 2023-11-11 | Disposition: A | Discharge status: Home / Self Care | Payer: Medicare Other | Attending: Emergency Medicine | Admitting: Emergency Medicine

## 2023-11-11 VITALS — BP 123/77 | HR 106 | Temp 102.8°F | Resp 22

## 2023-11-11 DIAGNOSIS — I509 Heart failure, unspecified: Secondary | ICD-10-CM | POA: Insufficient documentation

## 2023-11-11 DIAGNOSIS — J101 Influenza due to other identified influenza virus with other respiratory manifestations: Secondary | ICD-10-CM | POA: Insufficient documentation

## 2023-11-11 DIAGNOSIS — J441 Chronic obstructive pulmonary disease with (acute) exacerbation: Secondary | ICD-10-CM

## 2023-11-11 DIAGNOSIS — Z20822 Contact with and (suspected) exposure to covid-19: Secondary | ICD-10-CM | POA: Insufficient documentation

## 2023-11-11 DIAGNOSIS — J45909 Unspecified asthma, uncomplicated: Secondary | ICD-10-CM | POA: Insufficient documentation

## 2023-11-11 DIAGNOSIS — E119 Type 2 diabetes mellitus without complications: Secondary | ICD-10-CM | POA: Diagnosis not present

## 2023-11-11 DIAGNOSIS — J449 Chronic obstructive pulmonary disease, unspecified: Secondary | ICD-10-CM | POA: Diagnosis not present

## 2023-11-11 LAB — BASIC METABOLIC PANEL
Anion gap: 11 (ref 5–15)
BUN: 12 mg/dL (ref 6–20)
CO2: 21 mmol/L — ABNORMAL LOW (ref 22–32)
Calcium: 8.6 mg/dL — ABNORMAL LOW (ref 8.9–10.3)
Chloride: 99 mmol/L (ref 98–111)
Creatinine, Ser: 0.77 mg/dL (ref 0.44–1.00)
GFR, Estimated: >60 mL/min (ref >60)
Glucose, Bld: 176 mg/dL — ABNORMAL HIGH (ref 70–99)
Potassium: 3.7 mmol/L (ref 3.5–5.1)
Sodium: 131 mmol/L — ABNORMAL LOW (ref 135–145)

## 2023-11-11 LAB — CBC WITH DIFFERENTIAL/PLATELET
Abs Immature Granulocytes: 0.03 10*3/uL (ref 0.00–0.07)
Basophils Absolute: 0.1 10*3/uL (ref 0.0–0.1)
Basophils Relative: 1 %
Eosinophils Absolute: 0 10*3/uL (ref 0.0–0.5)
Eosinophils Relative: 0 %
HCT: 42.8 % (ref 36.0–46.0)
Hemoglobin: 14.5 g/dL (ref 12.0–15.0)
Immature Granulocytes: 1 %
Lymphocytes Relative: 9 %
Lymphs Abs: 0.6 10*3/uL — ABNORMAL LOW (ref 0.7–4.0)
MCH: 29.1 pg (ref 26.0–34.0)
MCHC: 33.9 g/dL (ref 30.0–36.0)
MCV: 85.8 fL (ref 80.0–100.0)
Monocytes Absolute: 0.5 10*3/uL (ref 0.1–1.0)
Monocytes Relative: 8 %
Neutro Abs: 5.3 10*3/uL (ref 1.7–7.7)
Neutrophils Relative %: 81 %
Platelets: 213 10*3/uL (ref 150–400)
RBC: 4.99 MIL/uL (ref 3.87–5.11)
RDW: 13.2 % (ref 11.5–15.5)
WBC: 6.5 10*3/uL (ref 4.0–10.5)
nRBC: 0 % (ref 0.0–0.2)

## 2023-11-11 LAB — RESP PANEL BY RT-PCR (RSV, FLU A&B, COVID)  RVPGX2
Influenza A by PCR: POSITIVE — AB
Influenza B by PCR: NEGATIVE
Resp Syncytial Virus by PCR: NEGATIVE
SARS Coronavirus 2 by RT PCR: NEGATIVE

## 2023-11-11 MED ORDER — ONDANSETRON 4 MG PO TBDP
4.0000 mg | ORAL_TABLET | Freq: Once | ORAL | Status: AC
  Administered 2023-11-11: 4 mg via ORAL
  Filled 2023-11-11: qty 1

## 2023-11-11 MED ORDER — ONDANSETRON 4 MG PO TBDP: 4.0000 mg | ORAL_TABLET | Freq: Three times a day (TID) | ORAL | 0 refills | Status: DC | PRN

## 2023-11-11 MED ORDER — OSELTAMIVIR PHOSPHATE 75 MG PO CAPS: 75.0000 mg | ORAL_CAPSULE | Freq: Two times a day (BID) | ORAL | 0 refills | Status: AC

## 2023-11-11 MED ORDER — BENZONATATE 100 MG PO CAPS: ORAL_CAPSULE | ORAL | 0 refills | Status: DC

## 2023-11-11 MED ORDER — PSEUDOEPH-BROMPHEN-DM 30-2-10 MG/5ML PO SYRP: 10.0000 mL | ORAL_SOLUTION | Freq: Four times a day (QID) | ORAL | 0 refills | Status: DC | PRN

## 2023-11-11 NOTE — ED Provider Triage Note (Signed)
Emergency Medicine Provider Triage Evaluation Note  Jasmine Larsen , a 58 y.o. female  was evaluated in triage.  Pt complains of cough, difficulty breathing, daughter had flu last week, sent by her doctor's office as oxygen level in the office was 88% on room air.  Review of Systems  Positive:  Negative:   Physical Exam  There were no vitals taken for this visit. Gen:   Awake, no distress   Resp:  Normal effort  MSK:   Moves extremities without difficulty  Other:    Medical Decision Making  Medically screening exam initiated at 12:47 PM.  Appropriate orders placed.  Jasmine Larsen was informed that the remainder of the evaluation will be completed by another provider, this initial triage assessment does not replace that evaluation, and the importance of remaining in the ED until their evaluation is complete.     Faythe Ghee, PA-C 11/11/23 1247

## 2023-11-11 NOTE — Telephone Encounter (Signed)
Copied from CRM [id number]. Topic: Clinical - Red Word Triage >> Nov 11, 2023  9:16 AM Almira Coaster wrote: Red Word that prompted transfer to Nurse Triage: Patient's daughter is calling because patient is starting to develop flu like symptoms, coughing, congestion, body aches, tiredness, throwing up, they measured her oxygen level and the reading was 90.  Chief Complaint: Flu symptoms Symptoms: Cough, body aches, vomiting, chills Frequency: A few days Pertinent Negatives: Patient denies SOB and chest pain Disposition: [] ED /[] Urgent Care (no appt availability in office) / [x] Appointment(In office/virtual)/ []  [facility]/ [] Home Care/ [] Refused Recommended Disposition /[] [facility]/ []  Follow-up with PCP Additional Notes: Spoke to patient and patient's daughter about the recent onset of flu symptoms. Patient is experiencing coughing spells, body aches, vomiting, and chills. Oxygen saturation was 88-90 and HR was 104, while on the phone with this RN. Patient denied SOB and wheezing at this time. Patient denied fever and chest pain at this time. Patient has a history of CHF, DM, and COPD. Patient is not currently on oxygen. Advised patient to see a provider within 4 hours. No availability at patient's current PCP office. Scheduled patient at [facility] for later this morning. Educated patient and her daughter on when it is appropriate to seek care at the ED. Advised patient to call back with additional questions. Patient complied.   Reason for Disposition  [1] MILD difficulty breathing (e.g., minimal/no SOB at rest, SOB with walking, pulse <100) AND [2] still present when not coughing  Answer Assessment - Initial Assessment Questions 1. ONSET: "When did the flu symptoms begin?"      Cough started a few days ago, vomiting started this morning  2. SEVERITY: "How bad is the cough today?"      Severe coughing spells happening this morning  3. SPUTUM: "Describe the color of your  sputum" (none, dry cough; clear, white, yellow, green)     Denies  4. HEMOPTYSIS: "Are you coughing up any blood?" If so ask: "How much?" (flecks, streaks, tablespoons, etc.)     Denies  5. DIFFICULTY BREATHING: "Are you having difficulty breathing?" If Yes, ask: "How bad is it?" (e.g., mild, moderate, severe)    - MILD: No SOB at rest, mild SOB with walking, speaks normally in sentences, can lie down, no retractions, pulse < 100.    - MODERATE: SOB at rest, SOB with minimal exertion and prefers to sit, cannot lie down flat, speaks in phrases, mild retractions, audible wheezing, pulse 100-120.    - SEVERE: Very SOB at rest, speaks in single words, struggling to breathe, sitting hunched forward, retractions, pulse > 120      Patient states she can breath normally, O2 at 88-90, pulse is 104 on the phone with this RN  6. FEVER: "Do you have a fever?" If Yes, ask: "What is your temperature, how was it measured, and when did it start?"     97.9 on the phone with this RN  7. CARDIAC HISTORY: "Do you have any history of heart disease?" (e.g., heart attack, congestive heart failure)      CHF  8. LUNG HISTORY: "Do you have any history of lung disease?"  (e.g., pulmonary embolus, asthma, emphysema)     Presumed COPD  10. OTHER SYMPTOMS: "Do you have any other symptoms?" (e.g., runny nose, wheezing, chest pain)       Nausea, body aches, chills, denies wheezing, denies chest pain  Protocols used: Cough - Acute Non-Productive-A-AH

## 2023-11-11 NOTE — ED Triage Notes (Signed)
First Nurse Note: Patient to ED via [facility] from doctor's office for SOB that started yesterday. Hx of COPD and wheezing. Given 2.5 of albuterol by EMS. Daughter currently has the flu.  EMS VS: 136/78 104 HR 88% RA- place on 2L [place] now 96% 102.8 temp 180 cbg

## 2023-11-11 NOTE — Discharge Instructions (Addendum)
Take the prescription meds as directed.  Follow-up with primary provider for ongoing concerns.

## 2023-11-11 NOTE — ED Triage Notes (Signed)
Pt has been sick with URI and productive cough.  Sent here due to spo2 88% on RA.  Pt is 94% on RA here.  Pt states that her daughter has been sick with flu and pt has been feeling fatigued with body aches and productive cough

## 2023-11-11 NOTE — Progress Notes (Signed)
Acute Care Office Visit  Subjective:   Jasmine Larsen [birth year] 11/11/2023  Chief Complaint  Patient presents with   URI   HPI: Patient is a 58 year old female who presents today for an acute visit with complaint of shortness of breath.  Symptoms have been present since yesterday.  Associated symptoms include: shortness of breath, chest tightness, cough, fever, nausea & emesis, diarrhea Vital signs- tachycardic, febrile, SpO2 87-88% Sick contacts: yes, Sunday daughter was sick with the flu  Patient is in visible respiratory distress. She is tripoding while in the exam room.  Patient Active Problem List   Diagnosis Date Noted   Polyp of sigmoid colon 09/21/2023   Polyp of rectum 09/21/2023   Encounter for screening colonoscopy 09/21/2023   UTI (urinary tract infection) 08/16/2023   Burning with urination 08/09/2023   Abnormal urinalysis 08/09/2023   Colon cancer screening 08/09/2023   Breast cancer screening by mammogram 08/09/2023   Need for hepatitis C screening test 08/09/2023   Encounter for screening for HIV 08/09/2023   Diabetic eye exam (HCC) 08/09/2023   Type 2 diabetes mellitus with complications (HCC) 08/09/2023   Hyperlipidemia associated with type 2 diabetes mellitus (HCC) 08/09/2023   Congestive heart failure (HCC) 08/09/2023   Vitamin D deficiency 08/09/2023   Vitamin B 12 deficiency 08/09/2023   Type 2 diabetes mellitus with diabetic neuropathy, without long-term current use of insulin (HCC) 08/09/2023   Bilateral sciatica 08/09/2023   Acute bronchitis with COPD (HCC) 08/09/2023   COPD exacerbation (HCC) 08/09/2023   Calcaneal spur of both feet 08/09/2023   Left wrist pain 07/06/2023   Visit for suture removal 07/06/2023   Fall 07/01/2023   Multiple lacerations 07/01/2023   Abrasion of left forearm 07/01/2023   COPD with acute exacerbation (HCC) 04/27/2023   Sepsis due to pneumonia (HCC) 04/27/2023   Mass of right forearm 03/31/2023    Degeneration of lumbar intervertebral disc 03/18/2023   Lumbar radiculopathy 03/18/2023   Acute pain of left shoulder 01/26/2023   Uncontrolled type 2 diabetes mellitus with hyperglycemia (HCC) 12/15/2022   Acute midline low back pain with left-sided sciatica 12/15/2022   Lower extremity edema 12/15/2022   Soft tissue mass 12/15/2022   Anxiety and depression 12/15/2022   Pneumonia due to COVID-19 virus 07/19/2020   Chronic diastolic CHF (congestive heart failure) (HCC) 07/19/2020   Hypokalemia 07/19/2020   Hypocalcemia 07/19/2020   Tobacco abuse 07/19/2020   Acute respiratory failure with hypoxia (HCC) 07/19/2020   Hypophosphatemia 07/19/2020   Nausea, vomiting and diarrhea 07/19/2020   Chest pain 07/13/2016   Shortness of breath 07/13/2016   COPD with asthma (HCC) 07/13/2016   Morbid obesity (HCC) 07/13/2016   Past Medical History:  Diagnosis Date   (HFpEF) heart failure with preserved ejection fraction (HCC)    a.) TTE 07/14/2016: EF 65-70%, no RWMAs, norm RVSF, mild MR; b.) TTE 08/12/2023: EF 55-60%, no RWMAs, G1DD, norm RVSF   Acute respiratory failure with hypoxia (HCC)    Anxiety    Asthma    Chest pain    COPD (chronic obstructive pulmonary disease) (HCC)    DDD (degenerative disc disease), lumbar    Depression    Hepatic steatosis    History of 2019 novel coronavirus disease (COVID-19) 07/19/2020   HLD (hyperlipidemia)    HTN (hypertension)    Lumbosacral spinal stenosis    Miscarriage    Morbid obesity (HCC)    Multiple gastric ulcers    Osteoarthritis    Peripheral  edema    Pneumonia due to COVID-19 virus    Sepsis due to pneumonia (HCC)    Sigmoid polyp    T2DM (type 2 diabetes mellitus) (HCC)    Vitamin B12 deficiency    Vitamin D deficiency    Past Surgical History:  Procedure Laterality Date   CESAREAN SECTION     x3   COLONOSCOPY WITH PROPOFOL N/A 09/21/2023   Procedure: COLONOSCOPY WITH PROPOFOL;  Surgeon: Toney Reil, MD;  Location: [facility]  ENDOSCOPY;  Service: Gastroenterology;  Laterality: N/A;   MASS EXCISION Right 10/28/2023   Procedure: INCISION AND REMOVAL OF RIGHT FOREARM MASS;  Surgeon: Christena Flake, MD;  Location: [facility] ORS;  Service: Orthopedics;  Laterality: Right;   POLYPECTOMY  09/21/2023   Procedure: POLYPECTOMY;  Surgeon: Toney Reil, MD;  Location: [facility] ENDOSCOPY;  Service: Gastroenterology;;   Family History  Problem Relation Age of Onset   Asthma Mother    Diabetes Mother    COPD Mother    Heart Problems Mother    Diabetes Brother    COPD Brother    Congestive Heart Failure Brother    Alcohol abuse Maternal Grandmother    Tuberculosis Maternal Grandmother    Lung cancer Maternal Grandmother    Outpatient Medications Prior to Visit  Medication Sig Dispense Refill   Accu-Chek Softclix Lancets lancets 1 each by Other route daily. 100 each 2   acetaminophen (TYLENOL) 650 MG CR tablet Take 1,300 mg by mouth every 8 (eight) hours as needed for pain.     albuterol (VENTOLIN HFA) 108 (90 Base) MCG/ACT inhaler Inhale 1-2 puffs into the lungs every 6 (six) hours as needed for wheezing or shortness of breath. 18 g 3   blood glucose meter kit and supplies KIT Use up to 4 times daily. 1 each 0   Blood Glucose Monitoring Suppl DEVI 1 each by Does not apply route as directed. May substitute to any manufacturer covered by patient's insurance. 1 each 0   cyanocobalamin (VITAMIN B12) 1000 MCG tablet Take 1 tablet (1,000 mcg total) by mouth daily. 90 tablet 3   dicyclomine (BENTYL) 20 MG tablet Take 20 mg by mouth 3 (three) times daily as needed for spasms.     Fluticasone-Umeclidin-Vilant (TRELEGY ELLIPTA) 100-62.5-25 MCG/ACT AEPB Inhale 1 puff into the lungs daily. 1 each 11   gabapentin (NEURONTIN) 400 MG capsule Take 1 capsule (400 mg total) by mouth 3 (three) times daily. 90 capsule 2   Glucose Blood (BLOOD GLUCOSE TEST STRIPS) STRP 1 each by In Vitro route daily. May substitute to any manufacturer covered by  patient's insurance. 100 strip 2   metFORMIN (GLUCOPHAGE) 500 MG tablet Take 1 tablet (500 mg total) by mouth 2 (two) times daily with a meal. 360 tablet 0   rosuvastatin (CRESTOR) 20 MG tablet Take 1 tablet (20 mg total) by mouth at bedtime.     Semaglutide, 1 MG/DOSE, 4 MG/3ML SOPN Inject 1 mg as directed once a week. 3 mL 6   tiZANidine (ZANAFLEX) 4 MG tablet Take 1 tablet (4 mg total) by mouth at bedtime as needed for muscle spasms. 90 tablet 3   Vitamin D, Ergocalciferol, (DRISDOL) 1.25 MG (50000 UNIT) CAPS capsule Take 1 capsule (50,000 Units total) by mouth every 7 (seven) days. 12 capsule 1   No facility-administered medications prior to visit.   Allergies  Allergen Reactions   Cymbalta [Duloxetine Hcl]     SI thoughts   ROS: A complete ROS was performed  with pertinent positives/negatives noted in the HPI. The remainder of the ROS are negative.    Objective:   Today's Vitals   11/11/23 1118 11/11/23 1130  BP: 115/63 123/77  Pulse: (!) 107 (!) 106  Resp: (!) 22   Temp: (!) 102.8 F (39.3 C)   TempSrc: Oral   SpO2: (!) 88% (!) 88%  PainSc: 8    PainLoc: Chest    Physical Exam Vitals reviewed.  Constitutional:      General: She is in acute distress.     Appearance: She is obese. She is ill-appearing.  Cardiovascular:     Rate and Rhythm: Regular rhythm. Tachycardia present.     Pulses: Normal pulses.     Heart sounds: Normal heart sounds.  Pulmonary:     Effort: Tachypnea and respiratory distress present.     Breath sounds: Decreased air movement present. Examination of the right-middle field reveals decreased breath sounds. Examination of the left-middle field reveals decreased breath sounds. Examination of the right-lower field reveals decreased breath sounds and wheezing. Examination of the left-lower field reveals decreased breath sounds and wheezing. Decreased breath sounds and wheezing present.  Neurological:     Mental Status: She is alert.  Psychiatric:         Mood and Affect: Mood normal.        Behavior: Behavior normal.      Assessment & Plan:   1. Chronic obstructive pulmonary disease with acute respiratory distress (HCC) (Primary) Patient presents today for shortness of breath, chest tightness, cough, and fever who presents with her daughter. Daughter reports patient has been symptomatic since yesterday. Vital signs with tachycardia, tachypnea, pyrexia, and hypoxia on room air. Patient's daughter reports that she is a chronic cigarette smoker and her oxygen saturation is usually in the mid-90s. She does not wear supplemental oxygen at home. Patient in respiratory distress, sitting in the tripod position. Denies chest pain, vision changes, sudden onset headache, wheezing, weakness, speech difficulties. Cardiovascular exam with elevated, regular heart rate. Lung sounds diminished in all lung fields with expiratory wheezing present bilaterally in the lower lobes. Patient is able to answer all questions appropriately. EMS was called due to extensive past medical history of COPD, morbid obesity, DM2, HLD and heavy tobacco use. Patient did not want to be transported at first, but discussed with patient acute concerns due to her multiple co-morbidities and current state of respiratory distress. Patient eventually agreed to seek emergency care and transfer there via EMS.   2. Morbid obesity (HCC) See #1  No orders of the defined types were placed in this encounter.  Lab Orders  No laboratory test(s) ordered today   No images are attached to the encounter or orders placed in the encounter.  Return if symptoms worsen or fail to improve.    Patient to reach out to office if new, worrisome, or unresolved symptoms arise or if no improvement in patient's condition. Patient verbalized understanding and is agreeable to treatment plan. All questions answered to patient's satisfaction.    Alyson Reedy, FNP

## 2023-11-12 ENCOUNTER — Ambulatory Visit: Payer: Medicare Other | Admitting: Pulmonary Disease

## 2023-11-12 ENCOUNTER — Ambulatory Visit: Payer: Medicare Other | Admitting: Family Medicine

## 2023-11-16 ENCOUNTER — Other Ambulatory Visit (INDEPENDENT_AMBULATORY_CARE_PROVIDER_SITE_OTHER): Payer: Self-pay | Admitting: Nurse Practitioner

## 2023-11-16 DIAGNOSIS — R600 Localized edema: Secondary | ICD-10-CM

## 2023-11-17 ENCOUNTER — Ambulatory Visit (INDEPENDENT_AMBULATORY_CARE_PROVIDER_SITE_OTHER): Payer: Medicare Other | Admitting: Vascular Surgery

## 2023-11-17 ENCOUNTER — Encounter (INDEPENDENT_AMBULATORY_CARE_PROVIDER_SITE_OTHER): Payer: Self-pay | Admitting: Vascular Surgery

## 2023-11-17 ENCOUNTER — Ambulatory Visit (INDEPENDENT_AMBULATORY_CARE_PROVIDER_SITE_OTHER): Payer: Medicare Other

## 2023-11-17 VITALS — BP 128/72 | HR 82 | Resp 18 | Ht 64.0 in | Wt 333.0 lb

## 2023-11-17 DIAGNOSIS — R600 Localized edema: Secondary | ICD-10-CM | POA: Diagnosis not present

## 2023-11-17 DIAGNOSIS — E118 Type 2 diabetes mellitus with unspecified complications: Secondary | ICD-10-CM

## 2023-11-17 DIAGNOSIS — I890 Lymphedema, not elsewhere classified: Secondary | ICD-10-CM | POA: Diagnosis not present

## 2023-11-17 NOTE — Progress Notes (Signed)
 MRN : [id number]  Jasmine Larsen is a 58 y.o. (May 28, 1966) female who presents with chief complaint of  Chief Complaint  Patient presents with   Follow-up    F/u Bilateral Venous Duplex   .  History of Present Illness: Patient returns today in follow up of her leg swelling and pain.  She has tried wearing compression socks over the past 6 weeks or so since her initial visit without much success.  Her legs are really about the same and remain swollen and painful much of the time.  No new ulceration or infection.  No fevers or chills.  Both lower extremities are affected.  Her venous reflux study was performed today.  This demonstrated no evidence of deep venous thrombosis, superficial thrombophlebitis or superficial venous reflux in either lower extremity.  There is a small amount of deep venous reflux in the right common femoral vein but is likely not clinically significant.  Current Outpatient Medications  Medication Sig Dispense Refill   Accu-Chek Softclix Lancets lancets 1 each by Other route daily. 100 each 2   acetaminophen  (TYLENOL ) 650 MG CR tablet Take 1,300 mg by mouth every 8 (eight) hours as needed for pain.     albuterol  (VENTOLIN  HFA) 108 (90 Base) MCG/ACT inhaler Inhale 1-2 puffs into the lungs every 6 (six) hours as needed for wheezing or shortness of breath. 18 g 3   benzonatate  (TESSALON  PERLES) 100 MG capsule Take 1-2 tabs TID prn cough 30 capsule 0   blood glucose meter kit and supplies KIT Use up to 4 times daily. 1 each 0   Blood Glucose Monitoring Suppl DEVI 1 each by Does not apply route as directed. May substitute to any manufacturer covered by patient's insurance. 1 each 0   brompheniramine-pseudoephedrine-DM 30-2-10 MG/5ML syrup Take 10 mLs by mouth 4 (four) times daily as needed. 240 mL 0   cyanocobalamin  (VITAMIN B12) 1000 MCG tablet Take 1 tablet (1,000 mcg total) by mouth daily. 90 tablet 3   dicyclomine  (BENTYL ) 20 MG tablet Take 20 mg by mouth 3 (three)  times daily as needed for spasms.     Fluticasone -Umeclidin-Vilant (TRELEGY ELLIPTA ) 100-62.5-25 MCG/ACT AEPB Inhale 1 puff into the lungs daily. 1 each 11   gabapentin  (NEURONTIN ) 400 MG capsule Take 1 capsule (400 mg total) by mouth 3 (three) times daily. 90 capsule 2   Glucose Blood (BLOOD GLUCOSE TEST STRIPS) STRP 1 each by In Vitro route daily. May substitute to any manufacturer covered by patient's insurance. 100 strip 2   metFORMIN  (GLUCOPHAGE ) 500 MG tablet Take 1 tablet (500 mg total) by mouth 2 (two) times daily with a meal. 360 tablet 0   ondansetron  (ZOFRAN -ODT) 4 MG disintegrating tablet Take 1 tablet (4 mg total) by mouth every 8 (eight) hours as needed for nausea or vomiting. 15 tablet 0   rosuvastatin  (CRESTOR ) 20 MG tablet Take 1 tablet (20 mg total) by mouth at bedtime.     Semaglutide , 1 MG/DOSE, 4 MG/3ML SOPN Inject 1 mg as directed once a week. 3 mL 6   tiZANidine  (ZANAFLEX ) 4 MG tablet Take 1 tablet (4 mg total) by mouth at bedtime as needed for muscle spasms. 90 tablet 3   Vitamin D , Ergocalciferol , (DRISDOL ) 1.25 MG (50000 UNIT) CAPS capsule Take 1 capsule (50,000 Units total) by mouth every 7 (seven) days. 12 capsule 1   No current facility-administered medications for this visit.    Past Medical History:  Diagnosis Date   (HFpEF) heart  failure with preserved ejection fraction (HCC)    a.) TTE 07/14/2016: EF 65-70%, no RWMAs, norm RVSF, mild MR; b.) TTE 08/12/2023: EF 55-60%, no RWMAs, G1DD, norm RVSF   Acute respiratory failure with hypoxia (HCC)    Anxiety    Asthma    Chest pain    COPD (chronic obstructive pulmonary disease) (HCC)    DDD (degenerative disc disease), lumbar    Depression    Hepatic steatosis    History of 2019 novel coronavirus disease (COVID-19) 07/19/2020   HLD (hyperlipidemia)    HTN (hypertension)    Lumbosacral spinal stenosis    Miscarriage    Morbid obesity (HCC)    Multiple gastric ulcers    Osteoarthritis    Peripheral edema     Pneumonia due to COVID-19 virus    Sepsis due to pneumonia (HCC)    Sigmoid polyp    T2DM (type 2 diabetes mellitus) (HCC)    Vitamin B12 deficiency    Vitamin D  deficiency     Past Surgical History:  Procedure Laterality Date   CESAREAN SECTION     x3   COLONOSCOPY WITH PROPOFOL  N/A 09/21/2023   Procedure: COLONOSCOPY WITH PROPOFOL ;  Surgeon: Unk Corinn Skiff, MD;  Location: [facility];  Service: Gastroenterology;  Laterality: N/A;   MASS EXCISION Right 10/28/2023   Procedure: INCISION AND REMOVAL OF RIGHT FOREARM MASS;  Surgeon: Edie Norleen PARAS, MD;  Location: [facility] OR[facility];  Service: Orthopedics;  Laterality: Right;   POLYPECTOMY  09/21/2023   Procedure: POLYPECTOMY;  Surgeon: Unk Corinn Skiff, MD;  Location: [facility];  Service: Gastroenterology;;     Social History   Tobacco Use   Smoking status: Every Day    Current packs/day: 2.50    Average packs/day: 2.5 packs/day for 42.1 years (105.2 ttl pk-yrs)    Types: Cigarettes    Start date: 17    Passive exposure: Past   Smokeless tobacco: Never  Vaping Use   Vaping status: Never Used  Substance Use Topics   Alcohol use: No   Drug use: No       Family History  Problem Relation Age of Onset   Asthma Mother    Diabetes Mother    COPD Mother    Heart Problems Mother    Diabetes Brother    COPD Brother    Congestive Heart Failure Brother    Alcohol abuse Maternal Grandmother    Tuberculosis Maternal Grandmother    Lung cancer Maternal Grandmother      Allergies  Allergen Reactions   Cymbalta  [Duloxetine  Hcl]     SI thoughts     REVIEW OF SYSTEMS (Negative unless checked)  Constitutional: [] Weight loss  [] Fever  [] Chills Cardiac: [] Chest pain   [] Chest pressure   [] Palpitations   [] Shortness of breath when laying flat   [] Shortness of breath at rest   [x] Shortness of breath with exertion. Vascular:  [x] Pain in legs with walking   [x] Pain in legs at rest   [] Pain in legs when laying flat    [] Claudication   [] Pain in feet when walking  [] Pain in feet at rest  [] Pain in feet when laying flat   [] History of DVT   [] Phlebitis   [x] Swelling in legs   [] Varicose veins   [] Non-healing ulcers Pulmonary:   [] Uses home oxygen   [] Productive cough   [] Hemoptysis   [] Wheeze  [x] COPD   [] Asthma Neurologic:  [] Dizziness  [] Blackouts   [] Seizures   [] History of stroke   [] History of  TIA  [] Aphasia   [] Temporary blindness   [] Dysphagia   [] Weakness or numbness in arms   [] Weakness or numbness in legs Musculoskeletal:  [] Arthritis   [] Joint swelling   [] Joint pain   [] Low back pain Hematologic:  [] Easy bruising  [] Easy bleeding   [] Hypercoagulable state   [] Anemic   Gastrointestinal:  [] Blood in stool   [] Vomiting blood  [] Gastroesophageal reflux/heartburn   [] Abdominal pain Genitourinary:  [] Chronic kidney disease   [] Difficult urination  [] Frequent urination  [] Burning with urination   [] Hematuria Skin:  [] Rashes   [] Ulcers   [] Wounds Psychological:  [x] History of anxiety   [x]  History of major depression.  Physical Examination  BP 128/72   Pulse 82   Resp 18   Ht 5' 4 (1.626 m)   Wt (!) 333 lb (151 kg)   BMI 57.16 kg/m  Gen:  WD/WN, NAD. Morbidly obese  Head: Grand/AT, No temporalis wasting. Ear/Nose/Throat: Hearing grossly intact, nares w/o erythema or drainage Eyes: Conjunctiva clear. Sclera non-icteric Neck: Supple.  Trachea midline Pulmonary:  Good air movement, no use of accessory muscles.  Cardiac: RRR, no JVD Vascular:  Vessel Right Left  Radial Palpable Palpable       Musculoskeletal: M/S 5/5 throughout.  No deformity or atrophy. 1-2+ BLE edema. Neurologic: Sensation grossly intact in extremities.  Symmetrical.  Speech is fluent.  Psychiatric: Judgment intact, Mood & affect appropriate for pt's clinical situation. Dermatologic: No rashes or ulcers noted.  No cellulitis or open wounds.      Labs Recent Results (from the past 2160 hours)  Nitric oxide      Status: None    Collection Time: 09/04/23 11:06 AM  Result Value Ref Range   Nitric Oxide  <5   Surgical pathology     Status: None   Collection Time: 09/21/23 12:00 AM  Result Value Ref Range   SURGICAL PATHOLOGY      SURGICAL PATHOLOGY [facility] [address], [address] [place], [place] [address] Telephone [phone] or [phone] Fax [phone]  REPORT OF SURGICAL PATHOLOGY   Accession #: [id number] Patient Name: ANNALEISE, BURGER Visit # : [id number]  MRN: [id number] Physician: Unk Cotton DOB/Age 01/21/1966 (Age: 45) Gender: F Collected Date: 09/21/2023 Received Date: 09/21/2023  FINAL DIAGNOSIS       1. Sigmoid  Colon Polyp, Cold snare x2 :       - TUBULAR ADENOMA (2).      - NEGATIVE FOR HIGH-GRADE DYSPLASIA AND MALIGNANCY.       2. Rectum, polyp(s), Cold snare :       - HYPERPLASTIC POLYP.      - NEGATIVE FOR DYSPLASIA AND MALIGNANCY.       ELECTRONIC SIGNATURE : Rubinas Md, Rexene , Sports Administrator, Electronic Signature  MICROSCOPIC DESCRIPTION  CASE COMMENTS STAINS USED IN DIAGNOSIS: H&E H&E    CLINICAL HISTORY  SPECIMEN(S) OBTAINED 1. Sigmoid  Colon Polyp, Cold Snare X2 2. Rectum, polyp(s), Cold Snare  SPECIMEN COMMENT S: SPECIMEN CLINICAL INFORMATION: 1. Screening colonoscopy.Colon polyp    Gross Description 1. Received in formalin labeled with the patient's name and Polyp x2 sigmoid colon are two 0.3-0.4 cm tan, rubbery, polypoid pieces of tissue, submitted in toto in a single cassette.  (LEF, 09/21/2023) 2. Received in formalin labeled with the patient's name and Polyp rectum is a 0.5 cm tan, rubbery, polypoid piece of tissue that is bisected and entirely submitted in a single cassette.(LEF, 09/21/2023)        Report signed out  from the following location(s) [place] [address] [place], [place] [address] CLIA #: [id number]  [facility] [address] [place],  [place] [address] CLIA #: [id number]   Glucose, capillary     Status: Abnormal   Collection Time: 09/21/23  9:10 AM  Result Value Ref Range   Glucose-Capillary 147 (H) 70 - 99 mg/dL    Comment: Glucose reference range applies only to samples taken after fasting for at least 8 hours.   Comment 1 IN EPIC   Surgical pathology     Status: None   Collection Time: 10/28/23 12:00 AM  Result Value Ref Range   SURGICAL PATHOLOGY      SURGICAL PATHOLOGY [facility] [address] [place], [place] [address] Telephone [phone] or [phone] Fax [phone]  REPORT OF SURGICAL PATHOLOGY   Accession #: [id number] Patient Name: JAHNAE, MCADOO Visit # : [id number]  MRN: [id number] Physician: Edie Rush DOB/Age May 22, 1966 (Age: 60) Gender: F Collected Date: 10/28/2023 Received Date: 10/28/2023  FINAL DIAGNOSIS       1. Soft tissue, lipoma, Right forearm mass :       - AREA OF ORGANIZING FAT NECROSIS WITH REACTIVE FIBROSIS.       DATE SIGNED OUT: 10/29/2023 ELECTRONIC SIGNATURE : Janel Md, Rexene , Pathologist, Electronic Signature  MICROSCOPIC DESCRIPTION  CASE COMMENTS STAINS USED IN DIAGNOSIS: H&E    CLINICAL HISTORY  SPECIMEN(S) OBTAINED 1. Soft tissue, lipoma, Right Forearm Mass  SPECIMEN COMMENTS: SPECIMEN CLINICAL INFORMATION:    Gross Description 1. Right forearm mass, received fresh is a 5.2 x 4.0 x 1.2 cm aggregate of multip le soft, lobulated, tan-yellow tissue fragments. The cut surfaces are solid and homogenous with focal white-pink, dense, fibrous tissue; discrete hemorrhage and necrosis is grossly absent. Representative sections are submitted in 1 block (1A).      AMG 10/28/2023        Report signed out from the following location(s) [place]. [facility] [address] [place], [place] [address] CLIA #: [id number]  [facility] [address] [place], [place] [address] CLIA #:  [id number]   Glucose, capillary     Status: Abnormal   Collection Time: 10/28/23 10:25 AM  Result Value Ref Range   Glucose-Capillary 152 (H) 70 - 99 mg/dL    Comment: Glucose reference range applies only to samples taken after fasting for at least 8 hours.  Glucose, capillary     Status: Abnormal   Collection Time: 10/28/23 12:12 PM  Result Value Ref Range   Glucose-Capillary 151 (H) 70 - 99 mg/dL    Comment: Glucose reference range applies only to samples taken after fasting for at least 8 hours.  Basic metabolic panel     Status: Abnormal   Collection Time: 11/11/23 12:50 PM  Result Value Ref Range   Sodium 131 (L) 135 - 145 mmol/L   Potassium 3.7 3.5 - 5.1 mmol/L   Chloride 99 98 - 111 mmol/L   CO2 21 (L) 22 - 32 mmol/L   Glucose, Bld 176 (H) 70 - 99 mg/dL    Comment: Glucose reference range applies only to samples taken after fasting for at least 8 hours.   BUN 12 6 - 20 mg/dL   Creatinine, Ser 9.22 0.44 - 1.00 mg/dL   Calcium  8.6 (L) 8.9 - 10.3 mg/dL   GFR, Estimated >39 >39 mL/min    Comment: (NOTE) Calculated using the CKD-EPI  Creatinine Equation (2021)    Anion gap 11 5 - 15    Comment: Performed at [facility], [address] [place], [place] [address]  CBC with Differential     Status: Abnormal   Collection Time: 11/11/23 12:50 PM  Result Value Ref Range   WBC 6.5 4.0 - 10.5 K/uL   RBC 4.99 3.87 - 5.11 MIL/uL   Hemoglobin 14.5 12.0 - 15.0 g/dL   HCT 57.1 63.9 - 53.9 %   MCV 85.8 80.0 - 100.0 fL   MCH 29.1 26.0 - 34.0 pg   MCHC 33.9 30.0 - 36.0 g/dL   RDW 86.7 88.4 - 84.4 %   Platelets 213 150 - 400 K/uL   nRBC 0.0 0.0 - 0.2 %   Neutrophils Relative % 81 %   Neutro Abs 5.3 1.7 - 7.7 K/uL   Lymphocytes Relative 9 %   Lymphs Abs 0.6 (L) 0.7 - 4.0 K/uL   Monocytes Relative 8 %   Monocytes Absolute 0.5 0.1 - 1.0 K/uL   Eosinophils Relative 0 %   Eosinophils Absolute 0.0 0.0 - 0.5 K/uL   Basophils Relative 1 %   Basophils Absolute 0.1 0.0 - 0.1  K/uL   Immature Granulocytes 1 %   Abs Immature Granulocytes 0.03 0.00 - 0.07 K/uL    Comment: Performed at [facility], [address] [place], [place] [address]  Resp panel by RT-PCR (RSV, Flu A&B, Covid) Anterior Nasal Swab     Status: Abnormal   Collection Time: 11/11/23 12:52 PM   Specimen: Anterior Nasal Swab  Result Value Ref Range   SARS Coronavirus 2 by RT PCR NEGATIVE NEGATIVE    Comment: (NOTE) SARS-CoV-2 target nucleic acids are NOT DETECTED.  The SARS-CoV-2 RNA is generally detectable in upper respiratory specimens during the acute phase of infection. The lowest concentration of SARS-CoV-2 viral copies this assay can detect is 138 copies/mL. A negative result does not preclude SARS-Cov-2 infection and should not be used as the sole basis for treatment or other patient management decisions. A negative result may occur with  improper specimen collection/handling, submission of specimen other than nasopharyngeal swab, presence of viral mutation(s) within the areas targeted by this assay, and inadequate number of viral copies(<138 copies/mL). A negative result must be combined with clinical observations, patient history, and epidemiological information. The expected result is Negative.  Fact Sheet for Patients:  bloggercourse.com  Fact Sheet for Healthcare Providers:  seriousbroker.it  This test is no t yet approved or cleared by the [place]  FDA and  has been authorized for detection and/or diagnosis of SARS-CoV-2 by FDA under an Emergency Use Authorization (EUA). This EUA will remain  in effect (meaning this test can be used) for the duration of the COVID-19 declaration under Section 564(b)(1) of the Act, 21 U.S.C.section 360bbb-3(b)(1), unless the authorization is terminated  or revoked sooner.       Influenza A by PCR POSITIVE (A) NEGATIVE   Influenza B by PCR NEGATIVE NEGATIVE    Comment:  (NOTE) The Xpert Xpress SARS-CoV-2/FLU/RSV plus assay is intended as an aid in the diagnosis of influenza from Nasopharyngeal swab specimens and should not be used as a sole basis for treatment. Nasal washings and aspirates are unacceptable for Xpert Xpress SARS-CoV-2/FLU/RSV testing.  Fact Sheet for Patients: bloggercourse.com  Fact Sheet for Healthcare Providers: seriousbroker.it  This test is not yet approved or cleared by the [place]  FDA and has been authorized for detection and/or diagnosis of SARS-CoV-2  by FDA under an Emergency Use Authorization (EUA). This EUA will remain in effect (meaning this test can be used) for the duration of the COVID-19 declaration under Section 564(b)(1) of the Act, 21 U.S.C. section 360bbb-3(b)(1), unless the authorization is terminated or revoked.     Resp Syncytial Virus by PCR NEGATIVE NEGATIVE    Comment: (NOTE) Fact Sheet for Patients: bloggercourse.com  Fact Sheet for Healthcare Providers: seriousbroker.it  This test is not yet approved or cleared by the [place]  FDA and has been authorized for detection and/or diagnosis of SARS-CoV-2 by FDA under an Emergency Use Authorization (EUA). This EUA will remain in effect (meaning this test can be used) for the duration of the COVID-19 declaration under Section 564(b)(1) of the Act, 21 U.S.C. section 360bbb-3(b)(1), unless the authorization is terminated or revoked.  Performed at [facility], [address], [place], [place] [address]     Radiology DG Chest 2 View Result Date: 11/11/2023 CLINICAL DATA:  Difficulty breathing. Respiratory infection. Productive cough. EXAM: CHEST - 2 VIEW COMPARISON:  AP chest 08/12/2023 FINDINGS: Cardiac silhouette and mediastinal contours are within normal limits. The lungs are clear. No pleural effusion or pneumothorax. Mild-to-moderate  multilevel degenerative disc changes of the thoracic spine. IMPRESSION: No active cardiopulmonary disease. Electronically Signed   By: Tanda Lyons M.D.   On: 11/11/2023 14:32    Assessment/Plan  Lymphedema Her venous reflux study was performed today.  This demonstrated no evidence of deep venous thrombosis, superficial thrombophlebitis or superficial venous reflux in either lower extremity.  There is a small amount of deep venous reflux in the right common femoral vein but is likely not clinically significant.  Recommend:  No surgery or intervention at this point in time based on the venous duplex.  The patient has been wearing compression for more than 12 weeks with no or little benefit.  The patient has been exercising daily for more than 12 weeks. The patient has been elevating and taking OTC pain medications for more than 12 weeks.  None of these have have eliminated the pain related to the lymphedema or the discomfort regarding excessive swelling and venous congestion.    I have reviewed my discussion with the patient regarding lymphedema and why it  causes symptoms.  Patient will continue wearing graduated compression on a daily basis.  The compression socks have not helped much, so I given her a new prescription for the juxta lite Velcro compression system.  The patient should put the compression on first thing in the morning and removing them in the evening. The patient should not sleep in the compression.   In addition, behavioral modification throughout the day will be continued.  This will include frequent elevation (such as in a recliner), use of over the counter pain medications as needed and exercise such as walking.  The systemic causes for chronic edema such as liver, kidney and cardiac etiologies do not appear to have significant changed over the past year.    The patient has chronic , severe lymphedema with hyperpigmentation of the skin and has done MLD, skin care, medication,  diet, exercise, elevation and compression for 4 weeks with no improvement,  I am recommending a lymphedema pump.  The patient still has stage 3 lymphedema and therefore, I believe that a lymph pump is needed to improve the control of the patient's lymphedema and improve the quality of life.  Additionally, a lymph pump is warranted because it will reduce the risk of cellulitis and ulceration  in the future.  Patient should follow-up in six months   Morbid obesity (HCC) Weight loss would help with pain and swelling in the legs.  Type 2 diabetes mellitus with complications (HCC) blood glucose control important in reducing the progression of atherosclerotic disease. Also, involved in wound healing. On appropriate medications.    Selinda Gu, MD  11/17/2023 4:02 PM    This note was created with Dragon medical transcription system.  Any errors from dictation are purely unintentional

## 2023-11-17 NOTE — Assessment & Plan Note (Signed)
blood glucose control important in reducing the progression of atherosclerotic disease. Also, involved in wound healing. On appropriate medications.  

## 2023-11-17 NOTE — Assessment & Plan Note (Signed)
 Her venous reflux study was performed today.  This demonstrated no evidence of deep venous thrombosis, superficial thrombophlebitis or superficial venous reflux in either lower extremity.  There is a small amount of deep venous reflux in the right common femoral vein but is likely not clinically significant.  Recommend:  No surgery or intervention at this point in time based on the venous duplex.  The patient has been wearing compression for more than 12 weeks with no or little benefit.  The patient has been exercising daily for more than 12 weeks. The patient has been elevating and taking OTC pain medications for more than 12 weeks.  None of these have have eliminated the pain related to the lymphedema or the discomfort regarding excessive swelling and venous congestion.    I have reviewed my discussion with the patient regarding lymphedema and why it  causes symptoms.  Patient will continue wearing graduated compression on a daily basis.  The compression socks have not helped much, so I given her a new prescription for the juxta lite Velcro compression system.  The patient should put the compression on first thing in the morning and removing them in the evening. The patient should not sleep in the compression.   In addition, behavioral modification throughout the day will be continued.  This will include frequent elevation (such as in a recliner), use of over the counter pain medications as needed and exercise such as walking.  The systemic causes for chronic edema such as liver, kidney and cardiac etiologies do not appear to have significant changed over the past year.    The patient has chronic , severe lymphedema with hyperpigmentation of the skin and has done MLD, skin care, medication, diet, exercise, elevation and compression for 4 weeks with no improvement,  I am recommending a lymphedema pump.  The patient still has stage 3 lymphedema and therefore, I believe that a lymph pump is needed to  improve the control of the patient's lymphedema and improve the quality of life.  Additionally, a lymph pump is warranted because it will reduce the risk of cellulitis and ulceration in the future.  Patient should follow-up in six months

## 2023-11-17 NOTE — Assessment & Plan Note (Signed)
Weight loss would help with pain and swelling in the legs.

## 2023-11-18 ENCOUNTER — Encounter: Payer: Self-pay | Admitting: Family Medicine

## 2023-11-18 ENCOUNTER — Ambulatory Visit: Payer: Medicare Other | Admitting: Family Medicine

## 2023-11-18 ENCOUNTER — Ambulatory Visit
Admission: RE | Admit: 2023-11-18 | Discharge: 2023-11-18 | Disposition: A | Discharge status: Home / Self Care | Payer: Medicare Other | Source: Ambulatory Visit | Attending: Orthopedic Surgery | Admitting: Orthopedic Surgery

## 2023-11-18 ENCOUNTER — Ambulatory Visit: Payer: Medicare Other

## 2023-11-18 VITALS — BP 134/80 | HR 82 | Temp 98.4°F | Resp 17 | Ht 64.0 in | Wt 329.5 lb

## 2023-11-18 DIAGNOSIS — E871 Hypo-osmolality and hyponatremia: Secondary | ICD-10-CM | POA: Diagnosis not present

## 2023-11-18 DIAGNOSIS — Z6841 Body Mass Index (BMI) 40.0 and over, adult: Secondary | ICD-10-CM

## 2023-11-18 DIAGNOSIS — M79672 Pain in left foot: Secondary | ICD-10-CM | POA: Diagnosis not present

## 2023-11-18 DIAGNOSIS — J4489 Other specified chronic obstructive pulmonary disease: Secondary | ICD-10-CM

## 2023-11-18 DIAGNOSIS — R0602 Shortness of breath: Secondary | ICD-10-CM | POA: Diagnosis not present

## 2023-11-18 DIAGNOSIS — M4807 Spinal stenosis, lumbosacral region: Secondary | ICD-10-CM

## 2023-11-18 LAB — COMPREHENSIVE METABOLIC PANEL
ALT: 17 U/L (ref 0–35)
AST: 10 U/L (ref 0–37)
Albumin: 3.5 g/dL (ref 3.5–5.2)
Alkaline Phosphatase: 90 U/L (ref 39–117)
BUN: 10 mg/dL (ref 6–23)
CO2: 34 meq/L — ABNORMAL HIGH (ref 19–32)
Calcium: 8.3 mg/dL — ABNORMAL LOW (ref 8.4–10.5)
Chloride: 97 meq/L (ref 96–112)
Creatinine, Ser: 0.6 mg/dL (ref 0.40–1.20)
GFR: 100.75 mL/min (ref >60.00)
Glucose, Bld: 147 mg/dL — ABNORMAL HIGH (ref 70–99)
Potassium: 3.7 meq/L (ref 3.5–5.1)
Sodium: 137 meq/L (ref 135–145)
Total Bilirubin: 0.4 mg/dL (ref 0.2–1.2)
Total Protein: 6.8 g/dL (ref 6.0–8.3)

## 2023-11-18 MED ORDER — BREZTRI AEROSPHERE 160-9-4.8 MCG/ACT IN AERO: 2.0000 | INHALATION_SPRAY | Freq: Two times a day (BID) | RESPIRATORY_TRACT | Status: DC

## 2023-11-18 MED ORDER — BENZONATATE 100 MG PO CAPS: 200.0000 mg | ORAL_CAPSULE | Freq: Three times a day (TID) | ORAL | 0 refills | Status: DC | PRN

## 2023-11-18 MED ORDER — ALBUTEROL SULFATE (2.5 MG/3ML) 0.083% IN NEBU: 2.5000 mg | INHALATION_SOLUTION | Freq: Four times a day (QID) | RESPIRATORY_TRACT | 1 refills | Status: DC | PRN

## 2023-11-18 NOTE — Progress Notes (Signed)
 SUBJECTIVE:   Chief Complaint  Patient presents with   Hospitalization Follow-up   HPI Presents for hospital follow up   Discussed the use of AI scribe software for clinical note transcription with the patient, who gave verbal consent to proceed.  History of Present Illness Jasmine Larsen is a 58 year old female with chronic lung disease who presents for a hospital follow-up after influenza A infection.  She was hospitalized for influenza A and treated with Tamiflu , cough medications, and nausea medications, and was discharged the same day. Despite treatment, she continues to experience difficulty breathing and a persistent cough. Tamiflu  caused significant discomfort, but she has not experienced any fevers since the hospital visit. No chest pain, shortness of breath, or palpitations are present. Oxygen saturation has dropped to 79 at times.  She has a history of chronic lung disease and regularly uses inhalers. She frequently uses albuterol , which she feels is becoming less effective. Currently, she is using Trelegy once daily and has previously tried Breztri , which she found somewhat helpful. The only relief she experienced was during a nebulizer treatment at the hospital. She has not seen a pulmonologist recently due to scheduling conflicts but plans to switch to a new pulmonologist.  She experienced a fall in the bathroom about two weeks ago, resulting in a twisted left ankle. The pain is intermittent and improving, and she is able to move the ankle.  She has a history of diabetes and leg problems, which were previously discussed with a pulmonologist who suggested weight loss could improve her overall health. She smokes very little, noting she smoked one cigarette on the way to the appointment.     PERTINENT PMH / PSH: As above  OBJECTIVE:  BP 134/80   Pulse 82   Temp 98.4 F (36.9 C)   Resp 17   Ht 5' 4 (1.626 m)   Wt (!) 329 lb 8 oz (149.5 kg)   SpO2 91%   BMI  56.56 kg/m    Physical Exam Vitals reviewed.  Constitutional:      General: She is not in acute distress.    Appearance: Normal appearance. She is obese. She is not ill-appearing, toxic-appearing or diaphoretic.  Eyes:     General:        Right eye: No discharge.        Left eye: No discharge.     Conjunctiva/sclera: Conjunctivae normal.  Cardiovascular:     Rate and Rhythm: Normal rate and regular rhythm.     Heart sounds: Normal heart sounds.  Pulmonary:     Breath sounds: Wheezing present.  Musculoskeletal:        General: Normal range of motion.     Right ankle: No tenderness.     Right Achilles Tendon: Normal.     Left ankle: Swelling present. No tenderness.     Left Achilles Tendon: Normal.     Right foot: Normal range of motion. Swelling present.     Left foot: Normal range of motion. Swelling and tenderness present. No bony tenderness.  Feet:     Right foot:     Skin integrity: Skin integrity normal.     Left foot:     Skin integrity: Skin integrity normal.  Skin:    General: Skin is warm and dry.  Neurological:     General: No focal deficit present.     Mental Status: She is alert and oriented to person, place, and time. Mental status is at  baseline.  Psychiatric:        Mood and Affect: Mood normal.        Behavior: Behavior normal.        Thought Content: Thought content normal.        Judgment: Judgment normal.           08/13/2023    1:38 PM 08/06/2023    1:40 PM 03/31/2023    9:36 AM 01/26/2023    8:43 AM 12/15/2022    9:16 AM  Depression screen PHQ 2/9  Decreased Interest 1 0 3 3 0  Down, Depressed, Hopeless 1 3 2 3 3   PHQ - 2 Score 2 3 5 6 3   Altered sleeping 1 3 3 3 3   Tired, decreased energy 2 3 2 3 2   Change in appetite 1 3 0 3 0  Feeling bad or failure about yourself  3 3 0 3 3  Trouble concentrating 1 1 0 3 3  Moving slowly or fidgety/restless 0 0 0 0 1  Suicidal thoughts 0 3 0 1 3  PHQ-9 Score 10 19 10 22 18   Difficult doing  work/chores Somewhat difficult Somewhat difficult Not difficult at all Very difficult Very difficult      08/13/2023    1:38 PM 08/06/2023    1:40 PM 03/31/2023    9:37 AM 01/26/2023    8:43 AM  GAD 7 : Generalized Anxiety Score  Nervous, Anxious, on Edge 1 0 2 3  Control/stop worrying 2 3 2 3   Worry too much - different things 2 3 2 3   Trouble relaxing 2 3 2 3   Restless 1 1 1 1   Easily annoyed or irritable 1 3 0 3  Afraid - awful might happen 1 3 0 3  Total GAD 7 Score 10 16 9 19   Anxiety Difficulty Not difficult at all Very difficult Not difficult at all Very difficult    ASSESSMENT/PLAN:  COPD with asthma (HCC) Assessment & Plan: Increased use of Albuterol  inhaler due to influenza symptoms. Discussed the potential for receptor desensitization with overuse of Albuterol . -Consider temporary use of Breztri  if samples are available, then return to Trelegy. -Consider nebulizer treatment if patient has access to a machine at home.  Orders: -     Breztri  Aerosphere; Inhale 2 puffs into the lungs in the morning and at bedtime. -     Benzonatate ; Take 2 capsules (200 mg total) by mouth 3 (three) times daily as needed for cough.  Dispense: 30 capsule; Refill: 0  Hyponatremia -     Comprehensive metabolic panel  Shortness of breath Assessment & Plan: Post Influenza A infection.  Recently treated with Tamiflu , but still experiencing respiratory symptoms. Discussed the course of influenza. -Continue symptomatic management with inhalers.  Orders: -     Breztri  Aerosphere; Inhale 2 puffs into the lungs in the morning and at bedtime. -     Benzonatate ; Take 2 capsules (200 mg total) by mouth 3 (three) times daily as needed for cough.  Dispense: 30 capsule; Refill: 0 -     Albuterol  Sulfate; Take 3 mLs (2.5 mg total) by nebulization every 6 (six) hours as needed for wheezing or shortness of breath.  Dispense: 150 mL; Refill: 1  Left foot pain Assessment & Plan: Possible sprain or minor  fracture from a fall in the bathroom two weeks ago. Pain is improving. -Optional X-ray of left ankle if pain does not continue to improve.  Orders: -  DG Foot Complete Left  Morbid obesity (HCC) Assessment & Plan: Contributing to SOB -Continue GLP1 weekly     PDMP reviewed  Return in about 23 days (around 12/11/2023) for PCP.  Glenys Ferrari, MD

## 2023-11-18 NOTE — Patient Instructions (Addendum)
 It was a pleasure meeting you today. Thank you for allowing me to take part in your health care.  Our goals for today as we discussed include:  Use Breztri  2 puffs two times a day Once completed can go back to the Trellegy  Refill sent for Tessalon  Pearls  Left foot xray today  Follow up as scheduled   Referral sent for Mammogram. Please call to schedule appointment. [facility] [address] [place], [place] [address] [phone]     Recommend pneumonia 20 vaccine  This is a list of the screening recommended for you and due dates:  Health Maintenance  Topic Date Due   Medicare Annual Wellness Visit  Never done   Pneumococcal Vaccination (1 of 2 - PCV) Never done   Mammogram  Never done   Zoster (Shingles) Vaccine (1 of 2) Never done   COVID-19 Vaccine (1 - 2024-25 season) Never done   Yearly kidney health urinalysis for diabetes  12/15/2023   Flu Shot  01/11/2024*   Eye exam for diabetics  12/09/2023   Complete foot exam   12/15/2023   Hemoglobin A1C  02/04/2024   Screening for Lung Cancer  02/06/2024   Yearly kidney function blood test for diabetes  11/10/2024   Pap with HPV screening  02/03/2028   Colon Cancer Screening  09/20/2028   DTaP/Tdap/Td vaccine (2 - Td or Tdap) 06/30/2033   Hepatitis C Screening  Completed   HIV Screening  Completed   HPV Vaccine  Aged Out  *Topic was postponed. The date shown is not the original due date.     If you have any questions or concerns, please do not hesitate to call the office at [phone].  I look forward to our next visit and until then take care and stay safe.  Regards,   Glenys Ferrari, MD   [facility]

## 2023-11-19 ENCOUNTER — Encounter: Payer: Self-pay | Admitting: Family Medicine

## 2023-11-19 DIAGNOSIS — M79672 Pain in left foot: Secondary | ICD-10-CM | POA: Insufficient documentation

## 2023-11-19 DIAGNOSIS — E871 Hypo-osmolality and hyponatremia: Secondary | ICD-10-CM | POA: Insufficient documentation

## 2023-11-19 NOTE — Assessment & Plan Note (Signed)
 Post Influenza A infection.  Recently treated with Tamiflu , but still experiencing respiratory symptoms. Discussed the course of influenza. -Continue symptomatic management with inhalers.

## 2023-11-19 NOTE — Assessment & Plan Note (Signed)
 Possible sprain or minor fracture from a fall in the bathroom two weeks ago. Pain is improving. -Optional X-ray of left ankle if pain does not continue to improve.

## 2023-11-19 NOTE — Assessment & Plan Note (Signed)
 Contributing to SOB -Continue GLP1 weekly

## 2023-11-19 NOTE — Assessment & Plan Note (Signed)
 Increased use of Albuterol  inhaler due to influenza symptoms. Discussed the potential for receptor desensitization with overuse of Albuterol . -Consider temporary use of Breztri  if samples are available, then return to Trelegy. -Consider nebulizer treatment if patient has access to a machine at home.

## 2023-12-02 ENCOUNTER — Encounter: Payer: Self-pay | Admitting: *Deleted

## 2023-12-07 ENCOUNTER — Ambulatory Visit (INDEPENDENT_AMBULATORY_CARE_PROVIDER_SITE_OTHER): Payer: Medicare Other | Admitting: Podiatry

## 2023-12-07 ENCOUNTER — Encounter: Payer: Self-pay | Admitting: Podiatry

## 2023-12-07 VITALS — Ht 64.0 in | Wt 329.5 lb

## 2023-12-07 DIAGNOSIS — M79675 Pain in left toe(s): Secondary | ICD-10-CM

## 2023-12-07 DIAGNOSIS — E1140 Type 2 diabetes mellitus with diabetic neuropathy, unspecified: Secondary | ICD-10-CM

## 2023-12-07 DIAGNOSIS — B351 Tinea unguium: Secondary | ICD-10-CM

## 2023-12-07 DIAGNOSIS — M79674 Pain in right toe(s): Secondary | ICD-10-CM | POA: Diagnosis not present

## 2023-12-07 DIAGNOSIS — L84 Corns and callosities: Secondary | ICD-10-CM | POA: Diagnosis not present

## 2023-12-07 NOTE — Progress Notes (Signed)
  Subjective:  Patient ID: Jasmine Larsen, female    DOB: Aug 23, 1966,  MRN: [id number]  58 y.o. female presents at risk foot care with history of diabetic neuropathy and callus(es) of both feet and painful thick toenails that are difficult to trim. Painful toenails interfere with ambulation. Aggravating factors include wearing enclosed shoe gear. Pain is relieved with periodic professional debridement. Painful calluses are aggravated when weightbearing with and without shoegear. Pain is relieved with periodic professional debridement.  Chief Complaint  Patient presents with   Nail Problem    Pt is here for [facility] last A1C was 7.8 PCP is Dr Clent Ridges and LOV was earlier this month.   New problem(s): None   PCP is [name], MD.  Allergies  Allergen Reactions   Cymbalta [Duloxetine Hcl]     SI thoughts    Review of Systems: Negative except as noted in the HPI.   Objective:  Jasmine Larsen is a pleasant 58 y.o. female morbidly obese in NAD. AAO x 3.  Vascular Examination: Faintly palpable DP pulses. Nonpalpable PT pulses. CFT immediate b/l. Pedal hair present. No edema. No pain with calf compression b/l. Skin temperature gradient WNL b/l. No varicosities noted. No cyanosis or clubbing noted. Trace edema noted dorsal aspect of both feet.  Neurological Examination: Pt has subjective symptoms of neuropathy. Protective sensation decreased with 10 gram monofilament b/l.   Dermatological Examination: Pedal skin with normal turgor, texture and tone b/l. No open wounds nor interdigital macerations noted. Toenails 1-5 b/l thick, discolored, elongated with subungual debris and pain on dorsal palpation. Hyperkeratotic lesion(s) bilateral great toes.  No erythema, no edema, no drainage, no fluctuance.   Musculoskeletal Examination: Muscle strength 5/5 to b/l LE.  No pain, crepitus noted b/l. Pes planus deformity noted bilateral LE. Utilizes cane for ambulation assistance.  Radiographs:  None  Last A1c:      Latest Ref Rng & Units 08/06/2023    2:28 PM 07/01/2023    8:45 AM 03/31/2023    9:36 AM  Hemoglobin A1C  Hemoglobin-A1c 4.6 - 6.5 % 7.8  8.2  8.3      Assessment:   1. Pain due to onychomycosis of toenails of both feet   2. Callus   3. Type 2 diabetes mellitus with diabetic neuropathy, without long-term current use of insulin (HCC)    Plan:  Patient was evaluated and treated. All patient's and/or POA's questions/concerns addressed on today's visit. Toenails 1-5 debrided in length and girth without incident. Callus(es) bilateral great toes pared with sharp debridement without incident. Continue daily foot inspections and monitor blood glucose per PCP/Endocrinologist's recommendations. Continue soft, supportive shoe gear daily. Report any pedal injuries to medical professional. Call office if there are any questions/concerns. -Patient/POA to call should there be question/concern in the interim.  Return in about 3 months (around 03/05/2024).  Freddie Breech, DPM      [facility] LOCATION: [address] [place] [address]                   Office [phone]   [facility] LOCATION: [address] [place], [place] [address] Office [phone]

## 2023-12-11 ENCOUNTER — Ambulatory Visit: Payer: Medicare Other | Admitting: Family Medicine

## 2023-12-11 ENCOUNTER — Encounter: Payer: Self-pay | Admitting: Family Medicine

## 2023-12-11 VITALS — BP 116/84 | HR 83 | Temp 98.0°F | Resp 18 | Ht 64.0 in | Wt 331.0 lb

## 2023-12-11 DIAGNOSIS — E1140 Type 2 diabetes mellitus with diabetic neuropathy, unspecified: Secondary | ICD-10-CM

## 2023-12-11 DIAGNOSIS — Z23 Encounter for immunization: Secondary | ICD-10-CM | POA: Diagnosis not present

## 2023-12-11 DIAGNOSIS — E118 Type 2 diabetes mellitus with unspecified complications: Secondary | ICD-10-CM | POA: Diagnosis not present

## 2023-12-11 DIAGNOSIS — G2581 Restless legs syndrome: Secondary | ICD-10-CM

## 2023-12-11 DIAGNOSIS — Z7985 Long-term (current) use of injectable non-insulin antidiabetic drugs: Secondary | ICD-10-CM

## 2023-12-11 LAB — MICROALBUMIN / CREATININE URINE RATIO
Creatinine,U: 49.5 mg/dL
Microalb Creat Ratio: 91.6 mg/g — ABNORMAL HIGH (ref 0.0–30.0)
Microalb, Ur: 4.5 mg/dL — ABNORMAL HIGH (ref 0.0–1.9)

## 2023-12-11 LAB — POCT GLYCOSYLATED HEMOGLOBIN (HGB A1C): Hemoglobin A1C: 7.7 % — AB (ref 4.0–5.6)

## 2023-12-11 MED ORDER — SEMAGLUTIDE (2 MG/DOSE) 8 MG/3ML ~~LOC~~ SOPN: 2.0000 mg | PEN_INJECTOR | SUBCUTANEOUS | 1 refills | Status: DC

## 2023-12-11 MED ORDER — ROPINIROLE HCL 0.25 MG PO TABS: 0.2500 mg | ORAL_TABLET | Freq: Every day | ORAL | 1 refills | Status: DC

## 2023-12-11 NOTE — Patient Instructions (Addendum)
 It was a pleasure meeting you today. Thank you for allowing me to take part in your health care.  Our goals for today as we discussed include:   A1c 7.7 today.  Not much change from previous.  Increase Ozempic to 2 mg weekly Continue all other medications  Start Requip 0.25 mg at night.  Take 1-3 hours before bedtime.  Can increase dose to 0.5 mg after 2 days.  If no improvement notify MD.  Flu vaccine today Pneumonia 20 vaccine today.  No further vaccines required   Recommend Shingles vaccine.  This is a 2 dose series and can be given at your local pharmacy.  Please talk to your pharmacist about this.   Annual eye exam due.  Referral sent for Mammogram. Please call to schedule appointment. [facility] [address] [place], [place] [address] [phone]    This is a list of the screening recommended for you and due dates:  Health Maintenance  Topic Date Due   Medicare Annual Wellness Visit  Never done   Pneumococcal Vaccination (1 of 2 - PCV) Never done   Mammogram  Never done   Zoster (Shingles) Vaccine (1 of 2) Never done   COVID-19 Vaccine (1 - 2024-25 season) Never done   Eye exam for diabetics  12/09/2023   Yearly kidney health urinalysis for diabetes  12/15/2023   Flu Shot  01/11/2024*   Complete foot exam   12/15/2023   Screening for Lung Cancer  02/06/2024   Hemoglobin A1C  06/09/2024   Yearly kidney function blood test for diabetes  11/17/2024   Pap with HPV screening  02/03/2028   Colon Cancer Screening  09/20/2028   DTaP/Tdap/Td vaccine (2 - Td or Tdap) 06/30/2033   Hepatitis C Screening  Completed   HIV Screening  Completed   HPV Vaccine  Aged Out  *Topic was postponed. The date shown is not the original due date.      If you have any questions or concerns, please do not hesitate to call the office at [phone].  I look forward to our next visit and until then take care and stay safe.  Regards,   [name], MD   [facility]

## 2023-12-11 NOTE — Progress Notes (Signed)
 SUBJECTIVE:   Chief Complaint  Patient presents with   Diabetes   HPI Follow up chronic up disease management  Discussed the use of AI scribe software for clinical note transcription with the patient, who gave verbal consent to proceed.  History of Present Illness Jasmine Larsen "Diane" is a 58 year old female with diabetes and neuropathy who presents for a follow-up visit.  Her recent A1c was 7.7, indicating suboptimal control of her diabetes. She is currently on Ozempic 1 mg but has not been taking metformin or any other medications for the past couple of weeks. She plans to increase the Ozempic dose to 2 mg and administers her injections on Mondays.  She experiences neuropathy symptoms, particularly in her legs and feet, which include tingling and burning sensations, especially at night. She has been taking gabapentin 400 mg three times a day, but it is not effective. She has a neurology appointment scheduled for March, with a possibility of undergoing an EMG study.  She experiences restless leg symptoms and has been using Zanaflex for relief, although it is not specifically for that purpose. She is considering trying ropinirole for these symptoms.  She has not had a period since July but started spotting yesterday.  She has a history of smoking and acknowledges the need to decrease smoking.  She has not received her flu shot yet and is considering getting it along with the pneumonia shot. She has Medicaid and [employer].      PERTINENT PMH / PSH: As above  OBJECTIVE:  BP 116/84   Pulse 83   Temp 98 F (36.7 C)   Resp 18   Ht 5\' 4"  (1.626 m)   Wt (!) 331 lb (150.1 kg)   SpO2 100%   BMI 56.82 kg/m    Physical Exam Vitals reviewed.  Constitutional:      General: She is not in acute distress.    Appearance: Normal appearance. She is obese. She is not ill-appearing, toxic-appearing or diaphoretic.  Eyes:     General:        Right eye: No discharge.         Left eye: No discharge.     Conjunctiva/sclera: Conjunctivae normal.  Cardiovascular:     Rate and Rhythm: Normal rate and regular rhythm.     Heart sounds: Normal heart sounds.  Pulmonary:     Effort: Pulmonary effort is normal.     Breath sounds: Normal breath sounds.  Abdominal:     General: Bowel sounds are normal.  Musculoskeletal:        General: Normal range of motion.  Skin:    General: Skin is warm and dry.  Neurological:     General: No focal deficit present.     Mental Status: She is alert and oriented to person, place, and time. Mental status is at baseline.  Psychiatric:        Mood and Affect: Mood normal.        Behavior: Behavior normal.        Thought Content: Thought content normal.        Judgment: Judgment normal.           08/13/2023    1:38 PM 08/06/2023    1:40 PM 03/31/2023    9:36 AM 01/26/2023    8:43 AM 12/15/2022    9:16 AM  Depression screen PHQ 2/9  Decreased Interest 1 0 3 3 0  Down, Depressed, Hopeless 1 3 2 3  3  PHQ - 2 Score 2 3 5 6 3   Altered sleeping 1 3 3 3 3   Tired, decreased energy 2 3 2 3 2   Change in appetite 1 3 0 3 0  Feeling bad or failure about yourself  3 3 0 3 3  Trouble concentrating 1 1 0 3 3  Moving slowly or fidgety/restless 0 0 0 0 1  Suicidal thoughts 0 3 0 1 3  PHQ-9 Score 10 19 10 22 18   Difficult doing work/chores Somewhat difficult Somewhat difficult Not difficult at all Very difficult Very difficult      08/13/2023    1:38 PM 08/06/2023    1:40 PM 03/31/2023    9:37 AM 01/26/2023    8:43 AM  GAD 7 : Generalized Anxiety Score  Nervous, Anxious, on Edge 1 0 2 3  Control/stop worrying 2 3 2 3   Worry too much - different things 2 3 2 3   Trouble relaxing 2 3 2 3   Restless 1 1 1 1   Easily annoyed or irritable 1 3 0 3  Afraid - awful might happen 1 3 0 3  Total GAD 7 Score 10 16 9 19   Anxiety Difficulty Not difficult at all Very difficult Not difficult at all Very difficult    ASSESSMENT/PLAN:  Type 2  diabetes mellitus with complications (HCC) Assessment & Plan: A1c of 7.7, a slight decrease from previous. Patient reports inconsistent use of Metformin and Ozempic. -Increase Ozempic to 2mg  weekly. -Encourage consistent use of Metformin and Ozempic. -Check A1c at next visit.  Orders: -     POCT glycosylated hemoglobin (Hb A1C) -     Microalbumin / creatinine urine ratio -     Semaglutide (2 MG/DOSE); Inject 2 mg as directed once a week.  Dispense: 3 mL; Refill: 1  Restless leg syndrome Assessment & Plan: Patient reports leg discomfort and shaking at night, not fully relieved by Gabapentin. -Start Ropinirole 0.5mg  at night, with potential to increase to 1mg  if needed. -Consider increasing Gabapentin if Ropinirole is not effective.  Orders: -     rOPINIRole HCl; Take 1 tablet (0.25 mg total) by mouth at bedtime.  Dispense: 30 tablet; Refill: 1  Need for influenza vaccination -     Flu vaccine trivalent PF, 6mos and older(Flulaval,Afluria,Fluarix,Fluzone)  Encounter for immunization -     Pneumococcal conjugate vaccine 20-valent  Type 2 diabetes mellitus with diabetic neuropathy, without long-term current use of insulin (HCC) Assessment & Plan: Patient reports burning sensation in feet, likely due to neuropathy. -Continue Gabapentin 400mg  three times a day for pain, consider increasing dose  -Has follow up with Neurology     PDMP reviewed  Return in about 6 months (around 06/09/2024) for PCP, DM.  [name], MD

## 2023-12-14 ENCOUNTER — Ambulatory Visit: Payer: Medicare Other | Admitting: Pulmonary Disease

## 2023-12-20 ENCOUNTER — Encounter: Payer: Self-pay | Admitting: Family Medicine

## 2023-12-20 DIAGNOSIS — Z23 Encounter for immunization: Secondary | ICD-10-CM | POA: Insufficient documentation

## 2023-12-20 DIAGNOSIS — G2581 Restless legs syndrome: Secondary | ICD-10-CM | POA: Insufficient documentation

## 2023-12-20 NOTE — Assessment & Plan Note (Signed)
 A1c of 7.7, a slight decrease from previous. Patient reports inconsistent use of Metformin and Ozempic. -Increase Ozempic to 2mg  weekly. -Encourage consistent use of Metformin and Ozempic. -Check A1c at next visit.

## 2023-12-20 NOTE — Assessment & Plan Note (Addendum)
 Patient reports burning sensation in feet, likely due to neuropathy. -Continue Gabapentin 400mg  three times a day for pain, consider increasing dose  -Has follow up with Neurology

## 2023-12-20 NOTE — Assessment & Plan Note (Signed)
 Patient reports leg discomfort and shaking at night, not fully relieved by Gabapentin. -Start Ropinirole 0.5mg  at night, with potential to increase to 1mg  if needed. -Consider increasing Gabapentin if Ropinirole is not effective.

## 2023-12-22 ENCOUNTER — Encounter: Payer: Self-pay | Admitting: Family Medicine

## 2024-01-12 LAB — HM DIABETES EYE EXAM

## 2024-01-17 ENCOUNTER — Other Ambulatory Visit: Payer: Self-pay | Admitting: Family Medicine

## 2024-01-17 DIAGNOSIS — M5442 Lumbago with sciatica, left side: Secondary | ICD-10-CM

## 2024-01-17 DIAGNOSIS — E1140 Type 2 diabetes mellitus with diabetic neuropathy, unspecified: Secondary | ICD-10-CM

## 2024-01-31 ENCOUNTER — Other Ambulatory Visit: Payer: Self-pay | Admitting: Family Medicine

## 2024-01-31 DIAGNOSIS — G2581 Restless legs syndrome: Secondary | ICD-10-CM

## 2024-02-12 ENCOUNTER — Ambulatory Visit (INDEPENDENT_AMBULATORY_CARE_PROVIDER_SITE_OTHER): Admitting: Family Medicine

## 2024-02-12 VITALS — BP 143/81 | HR 69 | Temp 98.3°F | Resp 20 | Ht 64.0 in | Wt 338.4 lb

## 2024-02-12 DIAGNOSIS — E538 Deficiency of other specified B group vitamins: Secondary | ICD-10-CM

## 2024-02-12 DIAGNOSIS — E1140 Type 2 diabetes mellitus with diabetic neuropathy, unspecified: Secondary | ICD-10-CM

## 2024-02-12 DIAGNOSIS — K580 Irritable bowel syndrome with diarrhea: Secondary | ICD-10-CM | POA: Diagnosis not present

## 2024-02-12 DIAGNOSIS — M5442 Lumbago with sciatica, left side: Secondary | ICD-10-CM

## 2024-02-12 DIAGNOSIS — E118 Type 2 diabetes mellitus with unspecified complications: Secondary | ICD-10-CM

## 2024-02-12 DIAGNOSIS — F39 Unspecified mood [affective] disorder: Secondary | ICD-10-CM

## 2024-02-12 DIAGNOSIS — R0602 Shortness of breath: Secondary | ICD-10-CM | POA: Diagnosis not present

## 2024-02-12 DIAGNOSIS — R030 Elevated blood-pressure reading, without diagnosis of hypertension: Secondary | ICD-10-CM

## 2024-02-12 DIAGNOSIS — J4489 Other specified chronic obstructive pulmonary disease: Secondary | ICD-10-CM

## 2024-02-12 DIAGNOSIS — Z720 Tobacco use: Secondary | ICD-10-CM

## 2024-02-12 DIAGNOSIS — Z7984 Long term (current) use of oral hypoglycemic drugs: Secondary | ICD-10-CM | POA: Diagnosis not present

## 2024-02-12 DIAGNOSIS — G2581 Restless legs syndrome: Secondary | ICD-10-CM

## 2024-02-12 MED ORDER — GABAPENTIN 400 MG PO CAPS: 400.0000 mg | ORAL_CAPSULE | Freq: Three times a day (TID) | ORAL | 1 refills | Status: DC

## 2024-02-12 MED ORDER — ALBUTEROL SULFATE (2.5 MG/3ML) 0.083% IN NEBU: 2.5000 mg | INHALATION_SOLUTION | Freq: Four times a day (QID) | RESPIRATORY_TRACT | 1 refills | Status: DC | PRN

## 2024-02-12 MED ORDER — DICYCLOMINE HCL 20 MG PO TABS: 20.0000 mg | ORAL_TABLET | Freq: Three times a day (TID) | ORAL | 3 refills | Status: DC | PRN

## 2024-02-12 MED ORDER — ROPINIROLE HCL 0.5 MG PO TABS: 0.5000 mg | ORAL_TABLET | Freq: Every day | ORAL | 3 refills | Status: DC

## 2024-02-12 MED ORDER — METFORMIN HCL 500 MG PO TABS: 500.0000 mg | ORAL_TABLET | Freq: Two times a day (BID) | ORAL | 1 refills | Status: DC

## 2024-02-12 MED ORDER — CITALOPRAM HYDROBROMIDE 10 MG PO TABS: 10.0000 mg | ORAL_TABLET | Freq: Every day | ORAL | 0 refills | Status: DC

## 2024-02-12 NOTE — Progress Notes (Signed)
 SUBJECTIVE:   Chief Complaint  Patient presents with   Abdominal Pain    Off and on   HPI Presents to clinic for acute visit  Discussed the use of AI scribe software for clinical note transcription with the patient, who gave verbal consent to proceed.  History of Present Illness Jasmine YONG "Diane" is a 58 year old female with IBS and neuropathy who presents for medication refills and management of leg spasms.  She experiences intermittent abdominal pain since having COVID-19, which she describes as infrequent and similar to her usual IBS symptoms. Diarrhea is managed with medication, but she is currently out of Bentyl  and requires a refill.  She has been experiencing leg spasms and has increased her dose of Requip  to 0.5 mg, which she finds helpful. She also takes trazodone occasionally. Her neurologist suspects neuropathy, and she is awaiting further testing. She was initially prescribed gabapentin  for sciatica nerve pain, and she continues to take it. She has difficulty with mobilization due to pain and shortness of breath.  She mentions that her B12 and other vitamins were low, but she has restarted her medications, including B12, and notices an improvement in her blood sugar levels. She is also on cholesterol medication.      PERTINENT PMH / PSH: As above  OBJECTIVE:  BP (!) 143/81   Pulse 69   Temp 98.3 F (36.8 C)   Resp 20   Ht 5\' 4"  (1.626 m)   Wt (!) 338 lb 6 oz (153.5 kg)   SpO2 98%   BMI 58.08 kg/m    Physical Exam Vitals reviewed.  Constitutional:      General: She is not in acute distress.    Appearance: Normal appearance. She is obese. She is not ill-appearing, toxic-appearing or diaphoretic.  Eyes:     General:        Right eye: No discharge.        Left eye: No discharge.     Conjunctiva/sclera: Conjunctivae normal.  Cardiovascular:     Rate and Rhythm: Normal rate and regular rhythm.     Heart sounds: Normal heart sounds.  Pulmonary:      Effort: Pulmonary effort is normal.     Breath sounds: Normal breath sounds.  Abdominal:     General: Bowel sounds are normal.     Tenderness: There is no abdominal tenderness.  Musculoskeletal:        General: Normal range of motion.  Skin:    General: Skin is warm and dry.  Neurological:     General: No focal deficit present.     Mental Status: She is alert and oriented to person, place, and time. Mental status is at baseline.  Psychiatric:        Mood and Affect: Mood normal.        Behavior: Behavior normal.        Thought Content: Thought content normal.        Judgment: Judgment normal.           08/13/2023    1:38 PM 08/06/2023    1:40 PM 03/31/2023    9:36 AM 01/26/2023    8:43 AM 12/15/2022    9:16 AM  Depression screen PHQ 2/9  Decreased Interest 1 0 3 3 0  Down, Depressed, Hopeless 1 3 2 3 3   PHQ - 2 Score 2 3 5 6 3   Altered sleeping 1 3 3 3 3   Tired, decreased energy 2 3 2  3  2  Change in appetite 1 3 0 3 0  Feeling bad or failure about yourself  3 3 0 3 3  Trouble concentrating 1 1 0 3 3  Moving slowly or fidgety/restless 0 0 0 0 1  Suicidal thoughts 0 3 0 1 3  PHQ-9 Score 10 19 10 22 18   Difficult doing work/chores Somewhat difficult Somewhat difficult Not difficult at all Very difficult Very difficult      08/13/2023    1:38 PM 08/06/2023    1:40 PM 03/31/2023    9:37 AM 01/26/2023    8:43 AM  GAD 7 : Generalized Anxiety Score  Nervous, Anxious, on Edge 1 0 2 3  Control/stop worrying 2 3 2 3   Worry too much - different things 2 3 2 3   Trouble relaxing 2 3 2 3   Restless 1 1 1 1   Easily annoyed or irritable 1 3 0 3  Afraid - awful might happen 1 3 0 3  Total GAD 7 Score 10 16 9 19   Anxiety Difficulty Not difficult at all Very difficult Not difficult at all Very difficult    ASSESSMENT/PLAN:  Irritable bowel syndrome with diarrhea Assessment & Plan: Chronic IBS with intermittent abdominal pain and diarrhea. Symptoms managed with Bentyl . No  abdominal pain today during visit - Refill Bentyl  prescription.  Orders: -     Dicyclomine  HCl; Take 1 tablet (20 mg total) by mouth 3 (three) times daily as needed for spasms.  Dispense: 90 tablet; Refill: 3  Type 2 diabetes mellitus with complications (HCC) Assessment & Plan: Has recently stopped medications during recent passing of parent.   - Resume Metformin  500 mg BID -Resume Ozempic  to 2mg  weekly. -Encourage consistent use of Metformin  and Ozempic .   Orders: -     metFORMIN  HCl; Take 1 tablet (500 mg total) by mouth 2 (two) times daily with a meal.  Dispense: 360 tablet; Refill: 1  Shortness of breath -     Albuterol  Sulfate; Take 3 mLs (2.5 mg total) by nebulization every 6 (six) hours as needed for wheezing or shortness of breath.  Dispense: 150 mL; Refill: 1 -     For home use only DME 4 wheeled rolling walker with seat  Type 2 diabetes mellitus with diabetic neuropathy, without long-term current use of insulin  (HCC) Assessment & Plan: Refill Gabapentin    Orders: -     Gabapentin ; Take 1 capsule (400 mg total) by mouth 3 (three) times daily.  Dispense: 90 capsule; Refill: 1  Restless leg syndrome Assessment & Plan: Continues to have bilateral leg spasms at night. Increase Requip  to 0.5 mg qhs   Orders: -     rOPINIRole  HCl; Take 1 tablet (0.5 mg total) by mouth at bedtime.  Dispense: 90 tablet; Refill: 3 -     For home use only DME 4 wheeled rolling walker with seat  Acute midline low back pain with left-sided sciatica -     Gabapentin ; Take 1 capsule (400 mg total) by mouth 3 (three) times daily.  Dispense: 90 capsule; Refill: 1 -     For home use only DME 4 wheeled rolling walker with seat  COPD with asthma (HCC) -     For home use only DME 4 wheeled rolling walker with seat  Mood disorder (HCC) Assessment & Plan: Depressed mood and increasing anxiety exacerbated by recent passing of father. Start Celexa  10 mg daily Follow up in 4 weeks Mental health  resources provided.   Orders: -  Citalopram  Hydrobromide; Take 1 tablet (10 mg total) by mouth daily.  Dispense: 30 tablet; Refill: 0  Tobacco abuse Assessment & Plan: Continued tobacco use with a history of smoking. Smoking contributes to vasoconstriction and potentially exacerbates neuropathy symptoms. - Encourage smoking cessation using resources such as 1-800-QUIT-NOW.   Vitamin B 12 deficiency Assessment & Plan: Vitamin B12 deficiency with improvement in symptoms after restarting supplementation.   Elevated blood pressure reading Assessment & Plan: If readings remain elevated recommend initiating antihypertensives at next visit     PDMP reviewed  Return in about 4 weeks (around 03/11/2024), or if symptoms worsen or fail to improve, for PCP.  Valli Gaw, MD

## 2024-02-12 NOTE — Patient Instructions (Addendum)
 It was a pleasure meeting you today. Thank you for allowing me to take part in your health care.  Our goals for today as we discussed include:  Refills sent for requested medications  Important to take medications regularly  Increase Requip  to 0.5 mg at night  Start Celexa 10 mg daily for mood Notify MD of any side effect in 1-2 weeks  Follow up in 4 weeks  This is a list of the screening recommended for you and due dates:  Health Maintenance  Topic Date Due   Medicare Annual Wellness Visit  Never done   Mammogram  Never done   Zoster (Shingles) Vaccine (1 of 2) Never done   COVID-19 Vaccine (1 - 2024-25 season) Never done   Complete foot exam   12/15/2023   Screening for Lung Cancer  02/06/2024   Flu Shot  05/13/2024   Hemoglobin A1C  06/09/2024   Yearly kidney function blood test for diabetes  11/17/2024   Yearly kidney health urinalysis for diabetes  12/10/2024   Eye exam for diabetics  01/11/2025   Pap with HPV screening  02/03/2028   Colon Cancer Screening  09/20/2028   DTaP/Tdap/Td vaccine (2 - Td or Tdap) 06/30/2033   Pneumococcal Vaccination  Completed   Hepatitis C Screening  Completed   HIV Screening  Completed   HPV Vaccine  Aged Out   Meningitis B Vaccine  Aged Out      If you have any questions or concerns, please do not hesitate to call the office at [phone].  I look forward to our next visit and until then take care and stay safe.  Regards,   Valli Gaw, MD   [facility]

## 2024-02-19 ENCOUNTER — Encounter: Payer: Self-pay | Admitting: Family Medicine

## 2024-02-19 DIAGNOSIS — R030 Elevated blood-pressure reading, without diagnosis of hypertension: Secondary | ICD-10-CM | POA: Insufficient documentation

## 2024-02-19 DIAGNOSIS — F39 Unspecified mood [affective] disorder: Secondary | ICD-10-CM | POA: Insufficient documentation

## 2024-02-19 DIAGNOSIS — K589 Irritable bowel syndrome without diarrhea: Secondary | ICD-10-CM | POA: Insufficient documentation

## 2024-02-19 NOTE — Assessment & Plan Note (Addendum)
 Chronic IBS with intermittent abdominal pain and diarrhea. Symptoms managed with Bentyl . No abdominal pain today during visit - Refill Bentyl  prescription.

## 2024-02-19 NOTE — Assessment & Plan Note (Signed)
Refill Gabapentin 

## 2024-02-19 NOTE — Assessment & Plan Note (Signed)
 If readings remain elevated recommend initiating antihypertensives at next visit

## 2024-02-19 NOTE — Assessment & Plan Note (Signed)
 Has recently stopped medications during recent passing of parent.   - Resume Metformin  500 mg BID -Resume Ozempic  to 2mg  weekly. -Encourage consistent use of Metformin  and Ozempic .

## 2024-02-19 NOTE — Assessment & Plan Note (Signed)
 Vitamin B12 deficiency with improvement in symptoms after restarting supplementation.

## 2024-02-19 NOTE — Assessment & Plan Note (Signed)
 Continued tobacco use with a history of smoking. Smoking contributes to vasoconstriction and potentially exacerbates neuropathy symptoms. - Encourage smoking cessation using resources such as 1-800-QUIT-NOW.

## 2024-02-19 NOTE — Assessment & Plan Note (Signed)
 Continues to have bilateral leg spasms at night. Increase Requip  to 0.5 mg qhs

## 2024-02-19 NOTE — Assessment & Plan Note (Signed)
 Depressed mood and increasing anxiety exacerbated by recent passing of father. Start Celexa  10 mg daily Follow up in 4 weeks Mental health resources provided.

## 2024-02-22 ENCOUNTER — Ambulatory Visit (INDEPENDENT_AMBULATORY_CARE_PROVIDER_SITE_OTHER): Admitting: *Deleted

## 2024-02-22 VITALS — Ht 64.0 in | Wt 333.0 lb

## 2024-02-22 DIAGNOSIS — Z122 Encounter for screening for malignant neoplasm of respiratory organs: Secondary | ICD-10-CM

## 2024-02-22 DIAGNOSIS — Z0000 Encounter for general adult medical examination without abnormal findings: Secondary | ICD-10-CM

## 2024-02-22 NOTE — Patient Instructions (Addendum)
 Jasmine Larsen , Thank you for taking time out of your busy schedule to complete your Annual Wellness Visit with me. I enjoyed our conversation and look forward to speaking with you again next year. I, as well as your care team,  appreciate your ongoing commitment to your health goals. Please review the following plan we discussed and let me know if I can assist you in the future. Your Game plan/ To Do List    Referrals: If you haven't heard from the office you've been referred to, please reach out to them at the phone provided.  Remember to call and schedule your mammogram order was placed 08/06/23.  An order for your lung cancer screening has been placed.  Consider updating your shingles vaccines  You have an order for:  []   2D Mammogram  [x]   3D Mammogram  []   Bone Density     Please call for appointment:  [facility]  [address] [place] [address] [phone]   Make sure to wear two-piece clothing.  No lotions, powders, or deodorants the day of the appointment. Make sure to bring picture ID and insurance card.  Bring list of medications you are currently taking including any supplements.    Follow up Visits: Next Medicare AWV with our clinical staff: 02/24/25 @ 11:30   Have you seen your provider in the last 6 months (3 months if uncontrolled diabetes)? Yes Next Office Visit with your provider: 03/11/24 @ 11:20  Clinician Recommendations:  Aim for 30 minutes of exercise or brisk walking, 6-8 glasses of water, and 5 servings of fruits and vegetables each day.        This is a list of the screening recommended for you and due dates:  Health Maintenance  Topic Date Due   Mammogram  Never done   Zoster (Shingles) Vaccine (1 of 2) Never done   COVID-19 Vaccine (1 - 2024-25 season) Never done   Complete foot exam   12/15/2023   Screening for Lung Cancer  02/06/2024   Flu Shot  05/13/2024   Hemoglobin A1C  06/09/2024    Yearly kidney function blood test for diabetes  11/17/2024   Yearly kidney health urinalysis for diabetes  12/10/2024   Eye exam for diabetics  01/11/2025   Medicare Annual Wellness Visit  02/21/2025   Pap with HPV screening  02/03/2028   Colon Cancer Screening  09/20/2028   DTaP/Tdap/Td vaccine (2 - Td or Tdap) 06/30/2033   Pneumococcal Vaccination  Completed   Hepatitis C Screening  Completed   HIV Screening  Completed   HPV Vaccine  Aged Out   Meningitis B Vaccine  Aged Out    Advanced directives: (ACP Link)Information on Advanced Care Planning can be found at [place]  Print production planner Health Care Directives Advance Health Care Directives. [email or web address]  Advance Care Planning is important because it:  [x]  Makes sure you receive the medical care that is consistent with your values, goals, and preferences  [x]  It provides guidance to your family and loved ones and reduces their decisional burden about whether or not they are making the right decisions based on your wishes.  Follow the link provided in your after visit summary or read over the paperwork we have mailed to you to help you started getting your Advance Directives in place. If you need assistance in completing these, please reach out to us  so that we can help you!

## 2024-02-22 NOTE — Progress Notes (Signed)
 Subjective:   Jasmine Larsen is a 58 y.o. who presents for a Medicare Wellness preventive visit.  As a reminder, Annual Wellness Visits don't include a physical exam, and some assessments may be limited, especially if this visit is performed virtually. We may recommend an in-person visit if needed.  Visit Complete: Virtual I connected with  Jasmine Larsen on 02/22/24 by a audio enabled telemedicine application and verified that I am speaking with the correct person using two identifiers.  Patient Location: Home  Provider Location: Home Office  I discussed the limitations of evaluation and management by telemedicine. The patient expressed understanding and agreed to proceed.  Vital Signs: Because this visit was a virtual/telehealth visit, some criteria may be missing or patient reported. Any vitals not documented were not able to be obtained and vitals that have been documented are patient reported.  VideoDeclined- This patient declined Librarian, academic. Therefore the visit was completed with audio only.  Persons Participating in Visit: Patient assisted by daughter  Jasmine Larsen.  AWV Questionnaire: No: Patient Medicare AWV questionnaire was not completed prior to this visit.  Cardiac Risk Factors include: diabetes mellitus;obesity (BMI >30kg/m2);smoking/ tobacco exposure;dyslipidemia     Objective:     Today's Vitals   02/22/24 1141  Weight: (!) 333 lb (151 kg)  Height: 5\' 4"  (1.626 m)   Body mass index is 57.16 kg/m.     02/22/2024   12:07 PM 11/11/2023   12:48 PM 10/20/2023   10:44 AM 09/21/2023    9:10 AM 06/28/2023    3:25 PM 06/19/2023   11:05 AM 06/19/2023   10:44 AM  Advanced Directives  Does Patient Have a Medical Advance Directive? No No No No No No No  Would patient like information on creating a medical advance directive? No - Patient declined  No - Patient declined No - Patient declined  No - Patient declined No - Patient declined     Current Medications (verified) Outpatient Encounter Medications as of 02/22/2024  Medication Sig   Accu-Chek Softclix Lancets lancets 1 each by Other route daily.   albuterol  (PROVENTIL ) (2.5 MG/3ML) 0.083% nebulizer solution Take 3 mLs (2.5 mg total) by nebulization every 6 (six) hours as needed for wheezing or shortness of breath.   albuterol  (VENTOLIN  HFA) 108 (90 Base) MCG/ACT inhaler Inhale 1-2 puffs into the lungs every 6 (six) hours as needed for wheezing or shortness of breath.   blood glucose meter kit and supplies KIT Use up to 4 times daily.   Blood Glucose Monitoring Suppl DEVI 1 each by Does not apply route as directed. May substitute to any manufacturer covered by patient's insurance.   citalopram  (CELEXA ) 10 MG tablet Take 1 tablet (10 mg total) by mouth daily.   cyanocobalamin  (VITAMIN B12) 1000 MCG tablet Take 1 tablet (1,000 mcg total) by mouth daily.   dicyclomine  (BENTYL ) 20 MG tablet Take 1 tablet (20 mg total) by mouth 3 (three) times daily as needed for spasms.   Fluticasone -Umeclidin-Vilant (TRELEGY ELLIPTA ) 100-62.5-25 MCG/ACT AEPB Inhale 1 puff into the lungs daily.   gabapentin  (NEURONTIN ) 400 MG capsule Take 1 capsule (400 mg total) by mouth 3 (three) times daily.   Glucose Blood (BLOOD GLUCOSE TEST STRIPS) STRP 1 each by In Vitro route daily. May substitute to any manufacturer covered by patient's insurance.   metFORMIN  (GLUCOPHAGE ) 500 MG tablet Take 1 tablet (500 mg total) by mouth 2 (two) times daily with a meal.   ondansetron  (ZOFRAN -ODT) 4 MG  disintegrating tablet Take 1 tablet (4 mg total) by mouth every 8 (eight) hours as needed for nausea or vomiting.   rOPINIRole  (REQUIP ) 0.5 MG tablet Take 1 tablet (0.5 mg total) by mouth at bedtime.   rosuvastatin  (CRESTOR ) 20 MG tablet Take 1 tablet (20 mg total) by mouth at bedtime.   Semaglutide , 2 MG/DOSE, 8 MG/3ML SOPN Inject 2 mg as directed once a week.   tiZANidine  (ZANAFLEX ) 4 MG tablet Take 1 tablet (4 mg  total) by mouth at bedtime as needed for muscle spasms.   Vitamin D , Ergocalciferol , (DRISDOL ) 1.25 MG (50000 UNIT) CAPS capsule Take 1 capsule (50,000 Units total) by mouth every 7 (seven) days.   No facility-administered encounter medications on file as of 02/22/2024.    Allergies (verified) Cymbalta  [duloxetine  hcl]   History: Past Medical History:  Diagnosis Date   (HFpEF) heart failure with preserved ejection fraction (HCC)    a.) TTE 07/14/2016: EF 65-70%, no RWMAs, norm RVSF, mild MR; b.) TTE 08/12/2023: EF 55-60%, no RWMAs, G1DD, norm RVSF   Acute respiratory failure with hypoxia (HCC)    Anxiety    Asthma    Chest pain    COPD (chronic obstructive pulmonary disease) (HCC)    DDD (degenerative disc disease), lumbar    Depression    Hepatic steatosis    History of 2019 novel coronavirus disease (COVID-19) 07/19/2020   HLD (hyperlipidemia)    HTN (hypertension)    Lumbosacral spinal stenosis    Miscarriage    Morbid obesity (HCC)    Multiple gastric ulcers    Osteoarthritis    Peripheral edema    Pneumonia due to COVID-19 virus    Sepsis due to pneumonia (HCC)    Sigmoid polyp    T2DM (type 2 diabetes mellitus) (HCC)    Vitamin B12 deficiency    Vitamin D  deficiency    Past Surgical History:  Procedure Laterality Date   CESAREAN SECTION     x3   COLONOSCOPY WITH PROPOFOL  N/A 09/21/2023   Procedure: COLONOSCOPY WITH PROPOFOL ;  Surgeon: Selena Daily, MD;  Location: [facility] ENDOSCOPY;  Service: Gastroenterology;  Laterality: N/A;   MASS EXCISION Right 10/28/2023   Procedure: INCISION AND REMOVAL OF RIGHT FOREARM MASS;  Surgeon: Elner Hahn, MD;  Location: [facility] ORS;  Service: Orthopedics;  Laterality: Right;   POLYPECTOMY  09/21/2023   Procedure: POLYPECTOMY;  Surgeon: Selena Daily, MD;  Location: [facility] ENDOSCOPY;  Service: Gastroenterology;;   TUBAL LIGATION  April 21 1991   Family History  Problem Relation Age of Onset   Asthma Mother    Diabetes  Mother    COPD Mother    Heart Problems Mother    Diabetes Brother    COPD Brother    Congestive Heart Failure Brother    Early death Brother    Alcohol abuse Maternal Grandmother    Tuberculosis Maternal Grandmother    Lung cancer Maternal Grandmother    Social History   Socioeconomic History   Marital status: Divorced    Spouse name: Not on file   Number of children: 3   Years of education: Not on file   Highest education level: 7th grade  Occupational History   Not on file  Tobacco Use   Smoking status: Every Day    Current packs/day: 2.50    Average packs/day: 2.5 packs/day for 42.4 years (105.9 ttl pk-yrs)    Types: Cigarettes    Start date: 28    Passive exposure: Past  Smokeless tobacco: Never  Vaping Use   Vaping status: Never Used  Substance and Sexual Activity   Alcohol use: No   Drug use: No   Sexual activity: Not Currently    Birth control/protection: Abstinence  Other Topics Concern   Not on file  Social History Narrative   Disable      Curtis )35-36)   Ottie Blonder ( 35)   Ozie Bo (33)      Hobbies: none    Social Drivers of Corporate investment banker Strain: Low Risk  (02/22/2024)   Overall Financial Resource Strain (CARDIA)    Difficulty of Paying Living Expenses: Not hard at all  Recent Concern: Financial Resource Strain - Medium Risk (02/12/2024)   Overall Financial Resource Strain (CARDIA)    Difficulty of Paying Living Expenses: Somewhat hard  Food Insecurity: No Food Insecurity (02/22/2024)   Hunger Vital Sign    Worried About Running Out of Food in the Last Year: Never true    Ran Out of Food in the Last Year: Never true  Recent Concern: Food Insecurity - Food Insecurity Present (02/12/2024)   Hunger Vital Sign    Worried About Running Out of Food in the Last Year: Sometimes true    Ran Out of Food in the Last Year: Sometimes true  Transportation Needs: No Transportation Needs (02/22/2024)   PRAPARE - Scientist, research (physical sciences) (Medical): No    Lack of Transportation (Non-Medical): No  Physical Activity: Inactive (02/22/2024)   Exercise Vital Sign    Days of Exercise per Week: 0 days    Minutes of Exercise per Session: 0 min  Stress: No Stress Concern Present (02/22/2024)   [employer] of Occupational Health - Occupational Stress Questionnaire    Feeling of Stress : Only a little  Recent Concern: Stress - Stress Concern Present (02/12/2024)   [employer] of Occupational Health - Occupational Stress Questionnaire    Feeling of Stress : Very much  Social Connections: Moderately Isolated (02/22/2024)   Social Connection and Isolation Panel [NHANES]    Frequency of Communication with Friends and Family: Twice a week    Frequency of Social Gatherings with Friends and Family: More than three times a week    Attends Religious Services: More than 4 times per year    Active Member of [employer] or Organizations: No    Attends Engineer, structural: Never    Marital Status: Divorced    Tobacco Counseling Ready to quit: No Counseling given: Not Answered    Clinical Intake:  Pre-visit preparation completed: Yes  Pain : No/denies pain     BMI - recorded: 57.16 Nutritional Status: BMI > 30  Obese Nutritional Risks: Nausea/ vomitting/ diarrhea (from medication she takes and doctor is aware) Diabetes: Yes CBG done?: No Did pt. bring in CBG monitor from home?: No  Lab Results  Component Value Date   HGBA1C 7.7 (A) 12/11/2023   HGBA1C 7.8 (H) 08/06/2023   HGBA1C 8.2 (A) 07/01/2023     How often do you need to have someone help you when you read instructions, pamphlets, or other written materials from your doctor or pharmacy?: 1 - Never  Interpreter Needed?: No  Information entered by :: R. Haydan Wedig LPN   Activities of Daily Living     02/22/2024   11:46 AM 10/20/2023   10:46 AM  In your present state of health, do you have any difficulty performing the following activities:   Hearing? 0  Vision? 0   Difficulty concentrating or making decisions? 1   Walking or climbing stairs? 1   Dressing or bathing? 0   Doing errands, shopping? 1 0  Preparing Food and eating ? N   Using the Toilet? N   In the past six months, have you accidently leaked urine? Y   Do you have problems with loss of bowel control? N   Managing your Medications? N   Managing your Finances? N   Housekeeping or managing your Housekeeping? N     Patient Care Team: Valli Gaw, MD as PCP - General (Family Medicine)  Indicate any recent Medical Services you may have received from other than [facility] providers in the past year (date may be approximate).     Assessment:    This is a routine wellness examination for [place].  Hearing/Vision screen Hearing Screening - Comments:: No issues Vision Screening - Comments:: glasses   Goals Addressed             This Visit's Progress    Patient Stated       Wants to loss weight       Depression Screen     02/22/2024   11:55 AM 08/13/2023    1:38 PM 08/06/2023    1:40 PM 03/31/2023    9:36 AM 01/26/2023    8:43 AM 12/15/2022    9:16 AM  PHQ 2/9 Scores  PHQ - 2 Score 0 2 3 5 6 3   PHQ- 9 Score 8 10 19 10 22 18     Fall Risk     02/22/2024   11:50 AM 08/13/2023    1:37 PM 08/06/2023    1:40 PM 07/16/2023    8:54 AM 07/16/2023    8:47 AM  Fall Risk   Falls in the past year? 0 0 1 1 0  Number falls in past yr: 0 0 0 0 0  Injury with Fall? 0 0 1 1 0  Risk for fall due to : No Fall Risks No Fall Risks Impaired mobility;Impaired balance/gait    Follow up Falls prevention discussed;Falls evaluation completed Falls evaluation completed;Education provided Falls evaluation completed      MEDICARE RISK AT HOME:  Medicare Risk at Home Any stairs in or around the home?: No If so, are there any without handrails?: No Home free of loose throw rugs in walkways, pet beds, electrical cords, etc?: Yes Adequate lighting in your home to reduce risk  of falls?: Yes Life alert?: No Use of a cane, walker or w/c?: Yes Grab bars in the bathroom?: Yes Shower chair or bench in shower?: No Elevated toilet seat or a handicapped toilet?: No  TIMED UP AND GO:  Was the test performed?  No  Cognitive Function: 6CIT completed        02/22/2024   12:08 PM  6CIT Screen  What Year? 0 points  What month? 0 points  What time? 0 points  Count back from 20 0 points  Months in reverse 4 points  Repeat phrase 0 points  Total Score 4 points    Immunizations Immunization History  Administered Date(s) Administered   Influenza, Seasonal, Injecte, Preservative Fre 12/11/2023   PNEUMOCOCCAL CONJUGATE-20 12/11/2023   Tdap 07/01/2023    Screening Tests Health Maintenance  Topic Date Due   Medicare Annual Wellness (AWV)  Never done   MAMMOGRAM  Never done   Zoster Vaccines- Shingrix (1 of 2) Never done   COVID-19 Vaccine (1 - 2024-25 season) Never done  FOOT EXAM  12/15/2023   Lung Cancer Screening  02/06/2024   INFLUENZA VACCINE  05/13/2024   HEMOGLOBIN A1C  06/09/2024   Diabetic kidney evaluation - eGFR measurement  11/17/2024   Diabetic kidney evaluation - Urine ACR  12/10/2024   OPHTHALMOLOGY EXAM  01/11/2025   Cervical Cancer Screening (HPV/Pap Cotest)  02/03/2028   Colonoscopy  09/20/2028   DTaP/Tdap/Td (2 - Td or Tdap) 06/30/2033   Pneumococcal Vaccine 8-66 Years old  Completed   Hepatitis C Screening  Completed   HIV Screening  Completed   HPV VACCINES  Aged Out   Meningococcal B Vaccine  Aged Out    Health Maintenance  Health Maintenance Due  Topic Date Due   Medicare Annual Wellness (AWV)  Never done   MAMMOGRAM  Never done   Zoster Vaccines- Shingrix (1 of 2) Never done   COVID-19 Vaccine (1 - 2024-25 season) Never done   FOOT EXAM  12/15/2023   Lung Cancer Screening  02/06/2024   Health Maintenance Items Addressed:  Patient declines covid vaccine. Discussed the need for shingles vaccines. Mammogram ordered  08/06/23. Patient needs foot exam completed and documented at next visit. Lung cancer screening ordered   Additional Screening:  Vision Screening: Recommended annual ophthalmology exams for early detection of glaucoma and other disorders of the eye.Up to date [facility]  Dental Screening: Recommended annual dental exams for proper oral hygiene  Community Resource Referral / Chronic Care Management: CRR required this visit?  No   CCM required this visit?  No   Plan:    I have personally reviewed and noted the following in the patient's chart:   Medical and social history Use of alcohol, tobacco or illicit drugs  Current medications and supplements including opioid prescriptions. Patient is not currently taking opioid prescriptions. Functional ability and status Nutritional status Physical activity Advanced directives List of other physicians Hospitalizations, surgeries, and ER visits in previous 12 months Vitals Screenings to include cognitive, depression, and falls Referrals and appointments  In addition, I have reviewed and discussed with patient certain preventive protocols, quality metrics, and best practice recommendations. A written personalized care plan for preventive services as well as general preventive health recommendations were provided to patient.   Felicitas Horse, LPN   0/07/2724   After Visit Summary: (MyChart) Due to this being a telephonic visit, the after visit summary with patients personalized plan was offered to patient via MyChart   Notes: Please refer to Routing Comments.

## 2024-02-29 ENCOUNTER — Other Ambulatory Visit: Payer: Self-pay | Admitting: Family Medicine

## 2024-02-29 DIAGNOSIS — E118 Type 2 diabetes mellitus with unspecified complications: Secondary | ICD-10-CM

## 2024-03-01 ENCOUNTER — Encounter (INDEPENDENT_AMBULATORY_CARE_PROVIDER_SITE_OTHER): Payer: Self-pay

## 2024-03-08 ENCOUNTER — Other Ambulatory Visit: Payer: Self-pay | Admitting: Family Medicine

## 2024-03-08 DIAGNOSIS — F39 Unspecified mood [affective] disorder: Secondary | ICD-10-CM

## 2024-03-08 NOTE — Telephone Encounter (Signed)
 citalopram  (CELEXA ) 10 MG tablet  Last filled 02/12/2024 LOV 02/12/2024 NOV 03/11/2024

## 2024-03-11 ENCOUNTER — Ambulatory Visit (INDEPENDENT_AMBULATORY_CARE_PROVIDER_SITE_OTHER): Admitting: Family Medicine

## 2024-03-11 ENCOUNTER — Encounter: Payer: Self-pay | Admitting: Family Medicine

## 2024-03-11 VITALS — BP 130/70 | HR 76 | Temp 98.3°F | Resp 20 | Ht 64.0 in | Wt 334.0 lb

## 2024-03-11 DIAGNOSIS — Z7985 Long-term (current) use of injectable non-insulin antidiabetic drugs: Secondary | ICD-10-CM

## 2024-03-11 DIAGNOSIS — Z7984 Long term (current) use of oral hypoglycemic drugs: Secondary | ICD-10-CM

## 2024-03-11 DIAGNOSIS — M5442 Lumbago with sciatica, left side: Secondary | ICD-10-CM

## 2024-03-11 DIAGNOSIS — F39 Unspecified mood [affective] disorder: Secondary | ICD-10-CM | POA: Diagnosis not present

## 2024-03-11 DIAGNOSIS — E118 Type 2 diabetes mellitus with unspecified complications: Secondary | ICD-10-CM | POA: Diagnosis not present

## 2024-03-11 DIAGNOSIS — E1140 Type 2 diabetes mellitus with diabetic neuropathy, unspecified: Secondary | ICD-10-CM

## 2024-03-11 DIAGNOSIS — R110 Nausea: Secondary | ICD-10-CM | POA: Diagnosis not present

## 2024-03-11 DIAGNOSIS — Z720 Tobacco use: Secondary | ICD-10-CM

## 2024-03-11 MED ORDER — ONDANSETRON 4 MG PO TBDP: 4.0000 mg | ORAL_TABLET | Freq: Three times a day (TID) | ORAL | 1 refills | Status: DC | PRN

## 2024-03-11 MED ORDER — EMPAGLIFLOZIN 10 MG PO TABS: 10.0000 mg | ORAL_TABLET | Freq: Every day | ORAL | Status: AC

## 2024-03-11 MED ORDER — EMPAGLIFLOZIN 10 MG PO TABS: 10.0000 mg | ORAL_TABLET | Freq: Every day | ORAL | 11 refills | Status: DC

## 2024-03-11 MED ORDER — GABAPENTIN 400 MG PO CAPS
400.0000 mg | ORAL_CAPSULE | Freq: Three times a day (TID) | ORAL | 1 refills | Status: DC
Start: 2024-03-11 — End: 2024-06-29

## 2024-03-11 MED ORDER — ACCU-CHEK SOFTCLIX LANCETS MISC: 1.0000 | Freq: Every day | 2 refills | Status: AC

## 2024-03-11 MED ORDER — CITALOPRAM HYDROBROMIDE 20 MG PO TABS: 20.0000 mg | ORAL_TABLET | Freq: Every day | ORAL | 3 refills | Status: DC

## 2024-03-11 MED ORDER — HYDROXYZINE PAMOATE 25 MG PO CAPS: 25.0000 mg | ORAL_CAPSULE | Freq: Three times a day (TID) | ORAL | 11 refills | Status: DC | PRN

## 2024-03-11 NOTE — Progress Notes (Signed)
 SUBJECTIVE:   Chief Complaint  Patient presents with   Medical Management of Chronic Issues    4 weeks follow    HPI Presents for follow up chronic disease management  Discussed the use of AI scribe software for clinical note transcription with the patient, who gave verbal consent to proceed.  History of Present Illness Jasmine Larsen "Diane" is a 58 year old female with depression and insomnia who presents with concerns about her current medication regimen.  She has been taking Celexa  for her depression but has not noticed any improvement in her symptoms. Her mood remains low, and she experiences increased irritability. She has not picked up her latest refill from the pharmacy.  She experiences significant issues with sleep, sleeping only three to four hours a night. She has tried various over-the-counter remedies including Benadryl and melatonin, sometimes taking up to 20 mg of melatonin to achieve six hours of sleep. Ambien was previously effective for her sleep issues.  She has a history of seizures for which she was previously on Depakote, but she discontinued it after her seizures stopped. She has also been on Effexor and Klonopin for anxiety and stress, but did not find Effexor effective and has concerns about the side effects of benzodiazepines like Klonopin, particularly regarding memory and cognitive decline.  She is currently experiencing nausea, particularly the day after taking Ozempic . Her last A1c was 8.4, and she acknowledges not consistently taking her medication.  She mentions having protein in her urine and has noticed foamy urine. She has not had any urinary tract infections or yeast infections but is concerned about the possibility due to her medication.  She has COPD and asthma, which are considerations in her treatment plan, particularly regarding her sleep issues. She had to cancel a pulmonology appointment due to illness and has not yet rescheduled it.      PERTINENT PMH / PSH: As above  OBJECTIVE:  BP 130/70   Pulse 76   Temp 98.3 F (36.8 C)   Resp 20   Ht 5\' 4"  (1.626 m)   Wt (!) 334 lb (151.5 kg)   SpO2 97%   BMI 57.33 kg/m    Physical Exam Vitals reviewed.  Constitutional:      General: She is not in acute distress.    Appearance: Normal appearance. She is obese. She is not ill-appearing, toxic-appearing or diaphoretic.  Eyes:     General:        Right eye: No discharge.        Left eye: No discharge.     Conjunctiva/sclera: Conjunctivae normal.  Cardiovascular:     Rate and Rhythm: Normal rate and regular rhythm.     Heart sounds: Normal heart sounds.  Pulmonary:     Effort: Pulmonary effort is normal.     Breath sounds: Normal breath sounds.  Abdominal:     General: Bowel sounds are normal.  Musculoskeletal:        General: Normal range of motion.  Skin:    General: Skin is warm and dry.  Neurological:     General: No focal deficit present.     Mental Status: She is alert and oriented to person, place, and time. Mental status is at baseline.  Psychiatric:        Mood and Affect: Mood normal.        Behavior: Behavior normal.        Thought Content: Thought content normal.  Judgment: Judgment normal.           03/11/2024   11:35 AM 02/22/2024   11:55 AM 08/13/2023    1:38 PM 08/06/2023    1:40 PM 03/31/2023    9:36 AM  Depression screen PHQ 2/9  Decreased Interest 2 0 1 0 3  Down, Depressed, Hopeless 2 0 1 3 2   PHQ - 2 Score 4 0 2 3 5   Altered sleeping 3 2 1 3 3   Tired, decreased energy 1 1 2 3 2   Change in appetite 3 2 1 3  0  Feeling bad or failure about yourself  2 3 3 3  0  Trouble concentrating 1 0 1 1 0  Moving slowly or fidgety/restless 0 0 0 0 0  Suicidal thoughts 0 0 0 3 0  PHQ-9 Score 14 8 10 19 10   Difficult doing work/chores Not difficult at all Not difficult at all Somewhat difficult Somewhat difficult Not difficult at all      03/11/2024   11:36 AM 08/13/2023    1:38 PM  08/06/2023    1:40 PM 03/31/2023    9:37 AM  GAD 7 : Generalized Anxiety Score  Nervous, Anxious, on Edge 2 1 0 2  Control/stop worrying 1 2 3 2   Worry too much - different things 2 2 3 2   Trouble relaxing 2 2 3 2   Restless 1 1 1 1   Easily annoyed or irritable 2 1 3  0  Afraid - awful might happen 1 1 3  0  Total GAD 7 Score 11 10 16 9   Anxiety Difficulty Not difficult at all Not difficult at all Very difficult Not difficult at all    ASSESSMENT/PLAN:  Type 2 diabetes mellitus with complications (HCC) Assessment & Plan: A1c at 8.4. Nausea likely from Ozempic  and metformin .  - Reduce metformin  to once daily due to nausea - Continue Ozempic  2 mg weekly - Add Jardiance  10 mg daily to regimen. Sample provided x 14 tab - Monitor for urinary and yeast infections. - On statin - Normotensive  Orders: -     Accu-Chek Softclix Lancets; 1 each by Other route daily.  Dispense: 100 each; Refill: 2 -     Empagliflozin ; Take 1 tablet (10 mg total) by mouth daily before breakfast.  Dispense: 30 tablet; Refill: 11 -     Empagliflozin ; Take 1 tablet (10 mg total) by mouth daily before breakfast for 14 days.  Acute midline low back pain with left-sided sciatica -     Gabapentin ; Take 1 capsule (400 mg total) by mouth 3 (three) times daily.  Dispense: 90 capsule; Refill: 1  Nausea Assessment & Plan: Nausea linked to Ozempic  and metformin . Advised against routine anti-nausea meds with Ozempic . Zofran  discussed for nausea management. - Provide limited Zofran  for severe nausea, caution against regular use.  Orders: -     Ondansetron ; Take 1 tablet (4 mg total) by mouth every 8 (eight) hours as needed for nausea or vomiting.  Dispense: 15 tablet; Refill: 1  Mood disorder (HCC) Assessment & Plan: Depression unchanged on Celexa . Sleep disturbances persist. Previous meds ineffective. Discussed increasing Celexa  and Atarax  for sleep and anxiety. - Increase Celexa  to 20 mg daily. - Consider Atarax  25  mg at night for sleep and anxiety.  Orders: -     Citalopram  Hydrobromide; Take 1 tablet (20 mg total) by mouth daily.  Dispense: 90 tablet; Refill: 3 -     hydrOXYzine  Pamoate; Take 1 capsule (25 mg total) by mouth  every 8 (eight) hours as needed.  Dispense: 30 capsule; Refill: 11  Tobacco abuse Assessment & Plan: Continued tobacco use with a history of smoking. Not interested in smoking cessation - Encourage smoking cessation using resources such as 1-800-QUIT-NOW.      PDMP reviewed  Return in about 3 months (around 06/11/2024) for PCP.  Valli Gaw, MD

## 2024-03-11 NOTE — Patient Instructions (Addendum)
 It was a pleasure meeting you today. Thank you for allowing me to take part in your health care.  Our goals for today as we discussed include:  Refills sent for requested medications  Start Jardiance 10 mg daily Continue Ozempic  2 mg weekly Continue Metformin  as ordered  Increase Celexa  to 20 mg daily Start Atarax 25 mg at night  Follow up in 3 months    This is a list of the screening recommended for you and due dates:  Health Maintenance  Topic Date Due   Mammogram  Never done   Zoster (Shingles) Vaccine (1 of 2) Never done   COVID-19 Vaccine (1 - 2024-25 season) Never done   Complete foot exam   12/15/2023   Screening for Lung Cancer  02/06/2024   Flu Shot  05/13/2024   Hemoglobin A1C  06/09/2024   Yearly kidney function blood test for diabetes  11/17/2024   Yearly kidney health urinalysis for diabetes  12/10/2024   Eye exam for diabetics  01/11/2025   Medicare Annual Wellness Visit  02/21/2025   Pap with HPV screening  02/03/2028   Colon Cancer Screening  09/20/2028   DTaP/Tdap/Td vaccine (2 - Td or Tdap) 06/30/2033   Pneumococcal Vaccination  Completed   Hepatitis C Screening  Completed   HIV Screening  Completed   HPV Vaccine  Aged Out   Meningitis B Vaccine  Aged Out     If you have any questions or concerns, please do not hesitate to call the office at [phone].  I look forward to our next visit and until then take care and stay safe.  Regards,   Valli Gaw, MD   [facility]

## 2024-03-11 NOTE — Progress Notes (Signed)
 Medication Samples have been provided to the patient.  Drug name: Jardiance     Strength: 10 mg        Qty: 2 boxes LOT: [id number]  Exp.Date: 09/2025  Dosing instructions: Take 1 tablet (10 mg total) by mouth daily before breakfast for 14 days.   The patient has been instructed regarding the correct time, dose, and frequency of taking this medication, including desired effects and most common side effects.   Loetta Ringer  Harjot Dibello 12:04 PM 03/11/2024

## 2024-03-14 ENCOUNTER — Ambulatory Visit (INDEPENDENT_AMBULATORY_CARE_PROVIDER_SITE_OTHER): Payer: Medicare Other | Admitting: Podiatry

## 2024-03-14 ENCOUNTER — Encounter: Payer: Self-pay | Admitting: Podiatry

## 2024-03-14 VITALS — Ht 64.0 in | Wt 334.0 lb

## 2024-03-14 DIAGNOSIS — M79675 Pain in left toe(s): Secondary | ICD-10-CM

## 2024-03-14 DIAGNOSIS — E119 Type 2 diabetes mellitus without complications: Secondary | ICD-10-CM | POA: Diagnosis not present

## 2024-03-14 DIAGNOSIS — M2141 Flat foot [pes planus] (acquired), right foot: Secondary | ICD-10-CM | POA: Diagnosis not present

## 2024-03-14 DIAGNOSIS — E1140 Type 2 diabetes mellitus with diabetic neuropathy, unspecified: Secondary | ICD-10-CM

## 2024-03-14 DIAGNOSIS — M2142 Flat foot [pes planus] (acquired), left foot: Secondary | ICD-10-CM | POA: Diagnosis not present

## 2024-03-14 DIAGNOSIS — B351 Tinea unguium: Secondary | ICD-10-CM | POA: Diagnosis not present

## 2024-03-14 DIAGNOSIS — M79674 Pain in right toe(s): Secondary | ICD-10-CM

## 2024-03-14 DIAGNOSIS — L84 Corns and callosities: Secondary | ICD-10-CM | POA: Diagnosis not present

## 2024-03-15 ENCOUNTER — Other Ambulatory Visit (HOSPITAL_COMMUNITY): Payer: Self-pay

## 2024-03-15 ENCOUNTER — Telehealth: Payer: Self-pay

## 2024-03-15 ENCOUNTER — Encounter: Payer: Self-pay | Admitting: Family Medicine

## 2024-03-15 DIAGNOSIS — R110 Nausea: Secondary | ICD-10-CM | POA: Insufficient documentation

## 2024-03-15 NOTE — Assessment & Plan Note (Signed)
 Continued tobacco use with a history of smoking. Not interested in smoking cessation - Encourage smoking cessation using resources such as 1-800-QUIT-NOW.

## 2024-03-15 NOTE — Assessment & Plan Note (Signed)
 A1c at 8.4. Nausea likely from Ozempic  and metformin .  - Reduce metformin  to once daily due to nausea - Continue Ozempic  2 mg weekly - Add Jardiance  10 mg daily to regimen. Sample provided x 14 tab - Monitor for urinary and yeast infections. - On statin - Normotensive

## 2024-03-15 NOTE — Telephone Encounter (Signed)
 Pharmacy Patient Advocate Encounter   Received notification from CoverMyMeds that prior authorization for Jardiance  10 mg tablets is required/requested.   Insurance verification completed.   The patient is insured through [facility] MEDICAID .   Per test claim: PA required; PA submitted to above mentioned insurance via CoverMyMeds Key/confirmation #/EOC [facility] Status is pending

## 2024-03-15 NOTE — Assessment & Plan Note (Signed)
 Nausea linked to Ozempic  and metformin . Advised against routine anti-nausea meds with Ozempic . Zofran  discussed for nausea management. - Provide limited Zofran  for severe nausea, caution against regular use.

## 2024-03-15 NOTE — Assessment & Plan Note (Signed)
 Depression unchanged on Celexa . Sleep disturbances persist. Previous meds ineffective. Discussed increasing Celexa  and Atarax  for sleep and anxiety. - Increase Celexa  to 20 mg daily. - Consider Atarax  25 mg at night for sleep and anxiety.

## 2024-03-15 NOTE — Telephone Encounter (Signed)
 PA need for Jardiance 

## 2024-03-16 NOTE — Telephone Encounter (Signed)
Noted. PA started

## 2024-03-16 NOTE — Telephone Encounter (Signed)
 PA request has been Submitted. New Encounter has been or will be created for follow up. For additional info see Pharmacy Prior Auth telephone encounter from 03/15/2024.

## 2024-03-17 NOTE — Progress Notes (Signed)
 ANNUAL DIABETIC FOOT EXAM  Subjective: Jasmine Larsen presents today for annual diabetic foot exam. She is accompanied by her daughter and two grandsons on today's visit. Chief Complaint  Patient presents with   Nail Problem    Pt is here for [name] last A1C was 8.4 PCP is Dr Sueanne Emerald and LOV was last week.   Patient confirms h/o diabetes.  Patient denies any h/o foot wounds.  Patient has been diagnosed with neuropathy.  Jasmine Gaw, MD is patient's PCP.  Past Medical History:  Diagnosis Date   (HFpEF) heart failure with preserved ejection fraction (HCC)    a.) TTE 07/14/2016: EF 65-70%, no RWMAs, norm RVSF, mild MR; b.) TTE 08/12/2023: EF 55-60%, no RWMAs, G1DD, norm RVSF   Acute respiratory failure with hypoxia (HCC)    Anxiety    Asthma    Chest pain    COPD (chronic obstructive pulmonary disease) (HCC)    DDD (degenerative disc disease), lumbar    Depression    Hepatic steatosis    History of 2019 novel coronavirus disease (COVID-19) 07/19/2020   HLD (hyperlipidemia)    HTN (hypertension)    Lumbosacral spinal stenosis    Miscarriage    Morbid obesity (HCC)    Multiple gastric ulcers    Osteoarthritis    Peripheral edema    Pneumonia due to COVID-19 virus    Sepsis due to pneumonia (HCC)    Sigmoid polyp    T2DM (type 2 diabetes mellitus) (HCC)    Vitamin B12 deficiency    Vitamin D  deficiency    Patient Active Problem List   Diagnosis Date Noted   Nausea 03/15/2024   IBS (irritable bowel syndrome) 02/19/2024   Mood disorder (HCC) 02/19/2024   Elevated blood pressure reading 02/19/2024   Restless leg syndrome 12/20/2023   Need for influenza vaccination 12/20/2023   Encounter for immunization 12/20/2023   Hyponatremia 11/19/2023   Left foot pain 11/19/2023   Lymphedema 11/17/2023   Polyp of sigmoid colon 09/21/2023   Polyp of rectum 09/21/2023   Encounter for screening colonoscopy 09/21/2023   UTI (urinary tract infection) 08/16/2023   Burning with  urination 08/09/2023   Abnormal urinalysis 08/09/2023   Colon cancer screening 08/09/2023   Breast cancer screening by mammogram 08/09/2023   Need for hepatitis C screening test 08/09/2023   Encounter for screening for HIV 08/09/2023   Diabetic eye exam (HCC) 08/09/2023   Type 2 diabetes mellitus with complications (HCC) 08/09/2023   Hyperlipidemia associated with type 2 diabetes mellitus (HCC) 08/09/2023   Congestive heart failure (HCC) 08/09/2023   Vitamin D  deficiency 08/09/2023   Vitamin B 12 deficiency 08/09/2023   Type 2 diabetes mellitus with diabetic neuropathy, without long-term current use of insulin  (HCC) 08/09/2023   Bilateral sciatica 08/09/2023   Acute bronchitis with COPD (HCC) 08/09/2023   COPD exacerbation (HCC) 08/09/2023   Calcaneal spur of both feet 08/09/2023   Left wrist pain 07/06/2023   Visit for suture removal 07/06/2023   Fall 07/01/2023   Multiple lacerations 07/01/2023   Abrasion of left forearm 07/01/2023   COPD with acute exacerbation (HCC) 04/27/2023   Sepsis due to pneumonia (HCC) 04/27/2023   Mass of right forearm 03/31/2023   Degeneration of lumbar intervertebral disc 03/18/2023   Lumbar radiculopathy 03/18/2023   Acute pain of left shoulder 01/26/2023   Uncontrolled type 2 diabetes mellitus with hyperglycemia (HCC) 12/15/2022   Acute midline low back pain with left-sided sciatica 12/15/2022   Lower extremity edema 12/15/2022  Soft tissue mass 12/15/2022   Anxiety and depression 12/15/2022   Pneumonia due to COVID-19 virus 07/19/2020   Chronic diastolic CHF (congestive heart failure) (HCC) 07/19/2020   Hypokalemia 07/19/2020   Hypocalcemia 07/19/2020   Tobacco abuse 07/19/2020   Hypophosphatemia 07/19/2020   Nausea, vomiting and diarrhea 07/19/2020   Chest pain 07/13/2016   Shortness of breath 07/13/2016   COPD with asthma (HCC) 07/13/2016   Morbid obesity (HCC) 07/13/2016   Past Surgical History:  Procedure Laterality Date   CESAREAN  SECTION     x3   COLONOSCOPY WITH PROPOFOL  N/A 09/21/2023   Procedure: COLONOSCOPY WITH PROPOFOL ;  Surgeon: Selena Daily, MD;  Location: [facility] ENDOSCOPY;  Service: Gastroenterology;  Laterality: N/A;   MASS EXCISION Right 10/28/2023   Procedure: INCISION AND REMOVAL OF RIGHT FOREARM MASS;  Surgeon: Elner Hahn, MD;  Location: [facility] ORS;  Service: Orthopedics;  Laterality: Right;   POLYPECTOMY  09/21/2023   Procedure: POLYPECTOMY;  Surgeon: Selena Daily, MD;  Location: [facility] ENDOSCOPY;  Service: Gastroenterology;;   TUBAL LIGATION  April 21 1991   Current Outpatient Medications on File Prior to Visit  Medication Sig Dispense Refill   Accu-Chek Softclix Lancets lancets 1 each by Other route daily. 100 each 2   albuterol  (PROVENTIL ) (2.5 MG/3ML) 0.083% nebulizer solution Take 3 mLs (2.5 mg total) by nebulization every 6 (six) hours as needed for wheezing or shortness of breath. 150 mL 1   albuterol  (VENTOLIN  HFA) 108 (90 Base) MCG/ACT inhaler Inhale 1-2 puffs into the lungs every 6 (six) hours as needed for wheezing or shortness of breath. 18 g 3   blood glucose meter kit and supplies KIT Use up to 4 times daily. 1 each 0   Blood Glucose Monitoring Suppl DEVI 1 each by Does not apply route as directed. May substitute to any manufacturer covered by patient's insurance. 1 each 0   citalopram  (CELEXA ) 20 MG tablet Take 1 tablet (20 mg total) by mouth daily. 90 tablet 3   cyanocobalamin  (VITAMIN B12) 1000 MCG tablet Take 1 tablet (1,000 mcg total) by mouth daily. 90 tablet 3   dicyclomine  (BENTYL ) 20 MG tablet Take 1 tablet (20 mg total) by mouth 3 (three) times daily as needed for spasms. 90 tablet 3   empagliflozin  (JARDIANCE ) 10 MG TABS tablet Take 1 tablet (10 mg total) by mouth daily before breakfast. 30 tablet 11   empagliflozin  (JARDIANCE ) 10 MG TABS tablet Take 1 tablet (10 mg total) by mouth daily before breakfast for 14 days.     Fluticasone -Umeclidin-Vilant (TRELEGY ELLIPTA )  100-62.5-25 MCG/ACT AEPB Inhale 1 puff into the lungs daily. 1 each 11   gabapentin  (NEURONTIN ) 400 MG capsule Take 1 capsule (400 mg total) by mouth 3 (three) times daily. 90 capsule 1   Glucose Blood (BLOOD GLUCOSE TEST STRIPS) STRP 1 each by In Vitro route daily. May substitute to any manufacturer covered by patient's insurance. 100 strip 2   hydrOXYzine  (VISTARIL ) 25 MG capsule Take 1 capsule (25 mg total) by mouth every 8 (eight) hours as needed. 30 capsule 11   metFORMIN  (GLUCOPHAGE ) 500 MG tablet Take 1 tablet (500 mg total) by mouth 2 (two) times daily with a meal. 360 tablet 1   ondansetron  (ZOFRAN -ODT) 4 MG disintegrating tablet Take 1 tablet (4 mg total) by mouth every 8 (eight) hours as needed for nausea or vomiting. 15 tablet 1   OZEMPIC , 2 MG/DOSE, 8 MG/3ML SOPN INJECT 2 MG AS DIRECTED ONCE A WEEK 3  mL 0   rOPINIRole  (REQUIP ) 0.5 MG tablet Take 1 tablet (0.5 mg total) by mouth at bedtime. 90 tablet 3   rosuvastatin  (CRESTOR ) 20 MG tablet Take 1 tablet (20 mg total) by mouth at bedtime.     tiZANidine  (ZANAFLEX ) 4 MG tablet Take 1 tablet (4 mg total) by mouth at bedtime as needed for muscle spasms. 90 tablet 3   Vitamin D , Ergocalciferol , (DRISDOL ) 1.25 MG (50000 UNIT) CAPS capsule Take 1 capsule (50,000 Units total) by mouth every 7 (seven) days. 12 capsule 1   No current facility-administered medications on file prior to visit.    Allergies  Allergen Reactions   Cymbalta  [Duloxetine  Hcl]     SI thoughts   Social History   Occupational History   Not on file  Tobacco Use   Smoking status: Every Day    Current packs/day: 2.50    Average packs/day: 2.5 packs/day for 42.4 years (106.1 ttl pk-yrs)    Types: Cigarettes    Start date: 42    Passive exposure: Past   Smokeless tobacco: Never  Vaping Use   Vaping status: Never Used  Substance and Sexual Activity   Alcohol use: No   Drug use: No   Sexual activity: Not Currently    Birth control/protection: Abstinence    Family History  Problem Relation Age of Onset   Asthma Mother    Diabetes Mother    COPD Mother    Heart Problems Mother    Diabetes Brother    COPD Brother    Congestive Heart Failure Brother    Early death Brother    Alcohol abuse Maternal Grandmother    Tuberculosis Maternal Grandmother    Lung cancer Maternal Grandmother    Immunization History  Administered Date(s) Administered   Influenza, Seasonal, Injecte, Preservative Fre 12/11/2023   PNEUMOCOCCAL CONJUGATE-20 12/11/2023   Tdap 07/01/2023     Review of Systems: Negative except as noted in the HPI.   Objective: There were no vitals filed for this visit.  Jasmine Larsen is a pleasant 58 y.o. female, morbidly obese, in NAD. AAO X 3.  Diabetic foot exam was performed with the following findings:   Vascular Examination: CFT <3 seconds b/l. DP pulses faintly palpable b/l. PT pulses nonpalpable b/l. Digital hair absent. Skin temperature gradient warm to warm b/l. No pain with calf compression. No ischemia or gangrene. No cyanosis or clubbing noted b/l. Trace edema noted BLE.   Neurological Examination: Pt has subjective symptoms of neuropathy. Protective sensation diminished with 10g monofilament b/l. Vibratory sensation intact b/l.  Dermatological Examination: Pedal skin warm and supple b/l. No open wounds b/l. No interdigital macerations. Toenails 1-5 b/l thick, discolored, elongated with subungual debris and pain on dorsal palpation.  Hyperkeratotic lesion(s) medial IPJ of bilateral great toes.  No erythema, no edema, no drainage, no fluctuance.  Musculoskeletal Examination: Muscle strength 5/5 to all lower extremity muscle groups bilaterally. Pes planus deformity noted bilateral LE. Utilizes cane for ambulation assistance.  Radiographs: None     Lab Results  Component Value Date   HGBA1C 7.7 (A) 12/11/2023   ADA Risk Categorization: High Risk  Patient has one or more of the following: Loss of protective  sensation Absent pedal pulses Severe Foot deformity History of foot ulcer  Assessment: 1. Pain due to onychomycosis of toenails of both feet   2. Callus   3. Pes planus of both feet   4. Type 2 diabetes mellitus with diabetic neuropathy, without long-term current use  of insulin  (HCC)   5. Encounter for diabetic foot exam (HCC)     Plan: Diabetic foot examination performed. All patient's and/or POA's questions/concerns addressed on today's visit. Mycotic toenails 1-5 debrided in length and girth without incident. Callus(es) bilateral great toes pared with sharp debridement without incident. Continue daily foot inspections and monitor blood glucose per PCP/Endocrinologist's recommendations. Continue soft, supportive shoe gear daily. Report any pedal injuries to medical professional. Call office if there are any questions/concerns. -Patient/POA to call should there be question/concern in the interim. Return in about 3 months (around 06/14/2024).  Luella Sager, DPM       LOCATION: [address], [place] [address]                   Office [phone]   [facility] LOCATION: [address] [place], [place] [address] Office [phone]

## 2024-03-30 ENCOUNTER — Other Ambulatory Visit: Payer: Self-pay | Admitting: Family Medicine

## 2024-03-30 DIAGNOSIS — E118 Type 2 diabetes mellitus with unspecified complications: Secondary | ICD-10-CM

## 2024-04-07 ENCOUNTER — Encounter: Payer: Self-pay | Admitting: Family Medicine

## 2024-05-12 ENCOUNTER — Other Ambulatory Visit (HOSPITAL_COMMUNITY): Payer: Self-pay

## 2024-05-12 NOTE — Telephone Encounter (Signed)
 Prior auth canceled. Patient has [employer] Plan last filled 05/06/2024 next fill 05/29/2024.

## 2024-05-16 ENCOUNTER — Encounter: Payer: Self-pay | Admitting: Internal Medicine

## 2024-05-16 DIAGNOSIS — R110 Nausea: Secondary | ICD-10-CM

## 2024-05-17 ENCOUNTER — Telehealth: Payer: Self-pay

## 2024-05-17 ENCOUNTER — Ambulatory Visit (INDEPENDENT_AMBULATORY_CARE_PROVIDER_SITE_OTHER): Payer: Medicare Other | Admitting: Vascular Surgery

## 2024-05-17 ENCOUNTER — Encounter (INDEPENDENT_AMBULATORY_CARE_PROVIDER_SITE_OTHER): Payer: Self-pay | Admitting: Vascular Surgery

## 2024-05-17 VITALS — BP 145/82 | HR 77 | Ht 64.0 in | Wt 339.0 lb

## 2024-05-17 DIAGNOSIS — I890 Lymphedema, not elsewhere classified: Secondary | ICD-10-CM | POA: Diagnosis not present

## 2024-05-17 DIAGNOSIS — E118 Type 2 diabetes mellitus with unspecified complications: Secondary | ICD-10-CM

## 2024-05-17 MED ORDER — ONDANSETRON 4 MG PO TBDP: 4.0000 mg | ORAL_TABLET | Freq: Three times a day (TID) | ORAL | 0 refills | Status: DC | PRN

## 2024-05-17 NOTE — Telephone Encounter (Signed)
 Former pt of Dr Hope and is scheduled to see Dr Abbey. I refilled zofran  x 1. In reviewing previous message, it appears she is on ozempic  and is requiring zofran  after taking. I have refilled x 1, but if she is having persistent nausea, she needs an appt to discuss her nausea and discuss possible change in medication.

## 2024-05-17 NOTE — Telephone Encounter (Signed)
 Copied from CRM #[id number]. Topic: General - Other >> May 17, 2024  2:56 PM Suzen RAMAN wrote: Reason for CRM: [employer] called to check the status of document faxed to the office requesting additional information pertaining to patient request for [facility] walker. [employer] is need patient demographics as well as f1f notes including information stating walker is medical necessary.  CB#[id number]

## 2024-05-17 NOTE — Telephone Encounter (Signed)
 Sending to doc of day fore review:  Pt requesting refill on nausea meds. No new symptoms. Pt uses 3-4 times a week

## 2024-05-17 NOTE — Progress Notes (Unsigned)
 MRN : [id number]  Jasmine Larsen is a 58 y.o. (Mar 11, 1966) female who presents with chief complaint of  Chief Complaint  Patient presents with   Follow-up  .  History of Present Illness: Patient returns today in follow up of her lymphedema, leg pain and swelling.  She is still having a lot of pain and swelling in her legs.  She did not get the lymphedema pumps that we requested at her last visit.  She tried to use her family members Velcro compression garments but the Velcro was broken so these did not really work.  Not surprisingly, her swelling persists.  She has been trying to walk more and elevate them.  She continues to have pain and swelling in the legs.  It sounds like her neurology referral for neuropathy was fairly unproductive by her report.  Current Outpatient Medications  Medication Sig Dispense Refill   Accu-Chek Softclix Lancets lancets 1 each by Other route daily. 100 each 2   albuterol  (PROVENTIL ) (2.5 MG/3ML) 0.083% nebulizer solution Take 3 mLs (2.5 mg total) by nebulization every 6 (six) hours as needed for wheezing or shortness of breath. 150 mL 1   albuterol  (VENTOLIN  HFA) 108 (90 Base) MCG/ACT inhaler Inhale 1-2 puffs into the lungs every 6 (six) hours as needed for wheezing or shortness of breath. 18 g 3   blood glucose meter kit and supplies KIT Use up to 4 times daily. 1 each 0   Blood Glucose Monitoring Suppl DEVI 1 each by Does not apply route as directed. May substitute to any manufacturer covered by patient's insurance. 1 each 0   citalopram  (CELEXA ) 20 MG tablet Take 1 tablet (20 mg total) by mouth daily. 90 tablet 3   cyanocobalamin  (VITAMIN B12) 1000 MCG tablet Take 1 tablet (1,000 mcg total) by mouth daily. 90 tablet 3   dicyclomine  (BENTYL ) 20 MG tablet Take 1 tablet (20 mg total) by mouth 3 (three) times daily as needed for spasms. 90 tablet 3   empagliflozin  (JARDIANCE ) 10 MG TABS tablet Take 1 tablet (10 mg total) by mouth daily before breakfast. 30  tablet 11   Fluticasone -Umeclidin-Vilant (TRELEGY ELLIPTA ) 100-62.5-25 MCG/ACT AEPB Inhale 1 puff into the lungs daily. 1 each 11   Glucose Blood (BLOOD GLUCOSE TEST STRIPS) STRP 1 each by In Vitro route daily. May substitute to any manufacturer covered by patient's insurance. 100 strip 2   hydrOXYzine  (VISTARIL ) 25 MG capsule Take 1 capsule (25 mg total) by mouth every 8 (eight) hours as needed. 30 capsule 11   metFORMIN  (GLUCOPHAGE ) 500 MG tablet Take 1 tablet (500 mg total) by mouth 2 (two) times daily with a meal. 360 tablet 1   rOPINIRole  (REQUIP ) 0.5 MG tablet Take 1 tablet (0.5 mg total) by mouth at bedtime. 90 tablet 3   rosuvastatin  (CRESTOR ) 20 MG tablet Take 1 tablet (20 mg total) by mouth at bedtime.     Semaglutide , 2 MG/DOSE, (OZEMPIC , 2 MG/DOSE,) 8 MG/3ML SOPN INJECT 2MG  SUBCUTANEOUSLY ONCE A WEEK AS DIRECTED 3 mL 2   tiZANidine  (ZANAFLEX ) 4 MG tablet Take 1 tablet (4 mg total) by mouth at bedtime as needed for muscle spasms. 90 tablet 3   Vitamin D , Ergocalciferol , (DRISDOL ) 1.25 MG (50000 UNIT) CAPS capsule Take 1 capsule (50,000 Units total) by mouth every 7 (seven) days. 12 capsule 1   gabapentin  (NEURONTIN ) 400 MG capsule Take 1 capsule (400 mg total) by mouth 3 (three) times daily. (Patient not taking: Reported on 05/17/2024) 90  capsule 1   ondansetron  (ZOFRAN -ODT) 4 MG disintegrating tablet Take 1 tablet (4 mg total) by mouth every 8 (eight) hours as needed for nausea or vomiting. 15 tablet 0   No current facility-administered medications for this visit.    Past Medical History:  Diagnosis Date   (HFpEF) heart failure with preserved ejection fraction (HCC)    a.) TTE 07/14/2016: EF 65-70%, no RWMAs, norm RVSF, mild MR; b.) TTE 08/12/2023: EF 55-60%, no RWMAs, G1DD, norm RVSF   Acute respiratory failure with hypoxia (HCC)    Anxiety    Asthma    Chest pain    COPD (chronic obstructive pulmonary disease) (HCC)    DDD (degenerative disc disease), lumbar    Depression     Hepatic steatosis    History of 2019 novel coronavirus disease (COVID-19) 07/19/2020   HLD (hyperlipidemia)    HTN (hypertension)    Lumbosacral spinal stenosis    Miscarriage    Morbid obesity (HCC)    Multiple gastric ulcers    Osteoarthritis    Peripheral edema    Pneumonia due to COVID-19 virus    Sepsis due to pneumonia (HCC)    Sigmoid polyp    T2DM (type 2 diabetes mellitus) (HCC)    Vitamin B12 deficiency    Vitamin D  deficiency     Past Surgical History:  Procedure Laterality Date   CESAREAN SECTION     x3   COLONOSCOPY WITH PROPOFOL  N/A 09/21/2023   Procedure: COLONOSCOPY WITH PROPOFOL ;  Surgeon: Unk Corinn Skiff, MD;  Location: [facility] ENDOSCOPY;  Service: Gastroenterology;  Laterality: N/A;   MASS EXCISION Right 10/28/2023   Procedure: INCISION AND REMOVAL OF RIGHT FOREARM MASS;  Surgeon: Edie Norleen PARAS, MD;  Location: [facility] ORS;  Service: Orthopedics;  Laterality: Right;   POLYPECTOMY  09/21/2023   Procedure: POLYPECTOMY;  Surgeon: Unk Corinn Skiff, MD;  Location: [facility] ENDOSCOPY;  Service: Gastroenterology;;   TUBAL LIGATION  April 21 1991     Social History   Tobacco Use   Smoking status: Every Day    Current packs/day: 2.50    Average packs/day: 2.5 packs/day for 42.6 years (106.5 ttl pk-yrs)    Types: Cigarettes    Start date: 27    Passive exposure: Past   Smokeless tobacco: Never  Vaping Use   Vaping status: Never Used  Substance Use Topics   Alcohol use: No   Drug use: No      Family History  Problem Relation Age of Onset   Asthma Mother    Diabetes Mother    COPD Mother    Heart Problems Mother    Diabetes Brother    COPD Brother    Congestive Heart Failure Brother    Early death Brother    Alcohol abuse Maternal Grandmother    Tuberculosis Maternal Grandmother    Lung cancer Maternal Grandmother      Allergies  Allergen Reactions   Cymbalta  [Duloxetine  Hcl]     SI thoughts       REVIEW OF SYSTEMS (Negative unless  checked)   Constitutional: [] Weight loss  [] Fever  [] Chills Cardiac: [] Chest pain   [] Chest pressure   [] Palpitations   [] Shortness of breath when laying flat   [] Shortness of breath at rest   [x] Shortness of breath with exertion. Vascular:  [x] Pain in legs with walking   [x] Pain in legs at rest   [] Pain in legs when laying flat   [] Claudication   [] Pain in feet when walking  [] Pain in  feet at rest  [] Pain in feet when laying flat   [] History of DVT   [] Phlebitis   [x] Swelling in legs   [] Varicose veins   [] Non-healing ulcers Pulmonary:   [] Uses home oxygen   [] Productive cough   [] Hemoptysis   [] Wheeze  [x] COPD   [] Asthma Neurologic:  [] Dizziness  [] Blackouts   [] Seizures   [] History of stroke   [] History of TIA  [] Aphasia   [] Temporary blindness   [] Dysphagia   [] Weakness or numbness in arms   [] Weakness or numbness in legs Musculoskeletal:  [] Arthritis   [] Joint swelling   [] Joint pain   [] Low back pain Hematologic:  [] Easy bruising  [] Easy bleeding   [] Hypercoagulable state   [] Anemic   Gastrointestinal:  [] Blood in stool   [] Vomiting blood  [] Gastroesophageal reflux/heartburn   [] Abdominal pain Genitourinary:  [] Chronic kidney disease   [] Difficult urination  [] Frequent urination  [] Burning with urination   [] Hematuria Skin:  [] Rashes   [] Ulcers   [] Wounds Psychological:  [x] History of anxiety   [x]  History of major depression.  Physical Examination  BP (!) 145/82   Pulse 77   Ht 5' 4 (1.626 m)   Wt (!) 339 lb (153.8 kg)   BMI 58.19 kg/m  Gen:  WD/WN, NAD. Morbidly obese.  Head: [place]/AT, No temporalis wasting. Ear/Nose/Throat: Hearing grossly intact, nares w/o erythema or drainage Eyes: Conjunctiva clear. Sclera non-icteric Neck: Supple.  Trachea midline Pulmonary:  Good air movement, no use of accessory muscles.  Cardiac: RRR, no JVD Vascular:  Vessel Right Left  Radial Palpable Palpable           Musculoskeletal: M/S 5/5 throughout.  No deformity or atrophy. 1+ BLE edema.   Significant hyperpigmentation and stasis dermatitis changes are present bilaterally. Neurologic: Sensation grossly intact in extremities.  Symmetrical.  Speech is fluent.  Psychiatric: Judgment intact, Mood & affect appropriate for pt's clinical situation. Dermatologic: No rashes or ulcers noted.  No cellulitis or open wounds.      Labs No results found for this or any previous visit (from the past 2160 hours).  Radiology No results found.  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  I again would strongly recommend she get a lymphedema pump for improvement and relief of her persistent disabling symptoms.  I have written her a new prescription for a juxta lite Velcro compression system as well.  We also discussed smoking cessation as a good benefit to her health overall.  I will plan to see her back in 6 months.  Morbid obesity (HCC) Weight loss would help with pain and swelling in the legs.   Type 2 diabetes mellitus with complications (HCC) blood glucose control important in reducing the progression of atherosclerotic disease. Also, involved in wound healing. On appropriate medications.  Selinda Gu, MD  05/18/2024 5:29 PM    This note was created with Dragon medical transcription system.  Any errors from dictation are purely unintentional

## 2024-05-18 NOTE — Assessment & Plan Note (Signed)
 The patient has stage II lymphedema refractory to conservative measures.  I again would strongly recommend she get a lymphedema pump for improvement and relief of her persistent disabling symptoms.  I have written her a new prescription for a juxta lite Velcro compression system as well.  We also discussed smoking cessation as a good benefit to her health overall.  I will plan to see her back in 6 months.

## 2024-05-20 NOTE — Telephone Encounter (Signed)
 Fax not received.pt would also need an appointment with any provider to have a order placed.

## 2024-06-09 ENCOUNTER — Encounter

## 2024-06-09 ENCOUNTER — Ambulatory Visit: Payer: Medicare Other | Admitting: Family Medicine

## 2024-06-20 ENCOUNTER — Ambulatory Visit: Admitting: Internal Medicine

## 2024-06-23 ENCOUNTER — Ambulatory Visit: Admitting: Podiatry

## 2024-06-23 ENCOUNTER — Encounter: Payer: Self-pay | Admitting: Podiatry

## 2024-06-23 DIAGNOSIS — L84 Corns and callosities: Secondary | ICD-10-CM

## 2024-06-23 DIAGNOSIS — E1140 Type 2 diabetes mellitus with diabetic neuropathy, unspecified: Secondary | ICD-10-CM | POA: Diagnosis not present

## 2024-06-26 ENCOUNTER — Encounter: Payer: Self-pay | Admitting: Podiatry

## 2024-06-26 NOTE — Progress Notes (Signed)
  Subjective:  Patient ID: Jasmine Larsen, female    DOB: 06-17-1966,  MRN: [id number]  Jasmine Larsen presents to clinic today for at risk foot care with history of diabetic neuropathy and callus(es) of both feet and painful thick toenails that are difficult to trim. Painful toenails interfere with ambulation. Aggravating factors include wearing enclosed shoe gear. Pain is relieved with periodic professional debridement. Painful calluses are aggravated when weightbearing with and without shoegear. Pain is relieved with periodic professional debridement.   She did see Neurology for neuropathy symptoms.  She is accompanied by her daughter and two grandsons on today's visit. Chief Complaint  Patient presents with   [facility]    Rm4  Diabetic foot care/ Dr.Tanya Hope last visit May 30,2025/A1C 8.4/ blood sugar 145   New problem(s): None.   She has an upcoming appointment with new PCP Dr. Kalpana Bair on next week.  Allergies  Allergen Reactions   Cymbalta  [Duloxetine  Hcl]     SI thoughts    Review of Systems: Negative except as noted in the HPI.  Objective: No changes noted in today's physical examination. There were no vitals filed for this visit. Jasmine Larsen is a pleasant 58 y.o. female morbidly obese in NAD. AAO x 3.  Vascular Examination: CFT <3 seconds b/l. DP pulses faintly palpable b/l. PT pulses nonpalpable b/l. Digital hair absent. Skin temperature gradient warm to warm b/l. No pain with calf compression. No ischemia or gangrene. No cyanosis or clubbing noted b/l. Trace edema noted BLE.   Neurological Examination: Pt has subjective symptoms of neuropathy. Protective sensation diminished with 10g monofilament b/l. Vibratory sensation intact b/l.  Dermatological Examination: Pedal skin warm and supple b/l. No open wounds b/l. No interdigital macerations. Toenails 1-5 b/l thick, discolored, elongated with subungual debris and pain on dorsal palpation.  Hyperkeratotic  lesion(s) medial IPJ of bilateral great toes.  No erythema, no edema, no drainage, no fluctuance.  Musculoskeletal Examination: Muscle strength 5/5 to all lower extremity muscle groups bilaterally. Pes planus deformity noted bilateral LE. Utilizes cane for ambulation assistance.  Radiographs: None  Assessment/Plan: 1. Callus   2. Type 2 diabetes mellitus with diabetic neuropathy, without long-term current use of insulin  [facility])   Patient was evaluated and treated. All patient's and/or POA's questions/concerns addressed on today's visit. Toenails 1-5 debrided in length and girth without incident. Callus(es) medial IPJ of left great toe and medial IPJ of right great toe pared with sharp debridement without incident. Continue daily foot inspections and monitor blood glucose per PCP/Endocrinologist's recommendations. Continue soft, supportive shoe gear daily. Report any pedal injuries to medical professional. Call office if there are any questions/concerns. Return in about 3 months (around 09/22/2024).  Delon LITTIE Merlin, DPM      [place] LOCATION: [address] [place] [address]                   Office [phone]   [facility] LOCATION: [address] [place], [place] [address] Office [phone]

## 2024-06-27 ENCOUNTER — Ambulatory Visit: Admitting: Internal Medicine

## 2024-06-29 ENCOUNTER — Other Ambulatory Visit: Payer: Self-pay

## 2024-06-29 ENCOUNTER — Ambulatory Visit (INDEPENDENT_AMBULATORY_CARE_PROVIDER_SITE_OTHER)

## 2024-06-29 VITALS — BP 124/84 | HR 86 | Temp 98.1°F | Wt 346.6 lb

## 2024-06-29 DIAGNOSIS — Z7984 Long term (current) use of oral hypoglycemic drugs: Secondary | ICD-10-CM | POA: Diagnosis not present

## 2024-06-29 DIAGNOSIS — E1140 Type 2 diabetes mellitus with diabetic neuropathy, unspecified: Secondary | ICD-10-CM | POA: Diagnosis not present

## 2024-06-29 DIAGNOSIS — E1169 Type 2 diabetes mellitus with other specified complication: Secondary | ICD-10-CM

## 2024-06-29 DIAGNOSIS — G2581 Restless legs syndrome: Secondary | ICD-10-CM

## 2024-06-29 DIAGNOSIS — K580 Irritable bowel syndrome with diarrhea: Secondary | ICD-10-CM

## 2024-06-29 DIAGNOSIS — M5441 Lumbago with sciatica, right side: Secondary | ICD-10-CM

## 2024-06-29 DIAGNOSIS — J209 Acute bronchitis, unspecified: Secondary | ICD-10-CM

## 2024-06-29 DIAGNOSIS — E785 Hyperlipidemia, unspecified: Secondary | ICD-10-CM | POA: Diagnosis not present

## 2024-06-29 DIAGNOSIS — J440 Chronic obstructive pulmonary disease with acute lower respiratory infection: Secondary | ICD-10-CM

## 2024-06-29 DIAGNOSIS — I509 Heart failure, unspecified: Secondary | ICD-10-CM

## 2024-06-29 DIAGNOSIS — I890 Lymphedema, not elsewhere classified: Secondary | ICD-10-CM

## 2024-06-29 DIAGNOSIS — Z6841 Body Mass Index (BMI) 40.0 and over, adult: Secondary | ICD-10-CM

## 2024-06-29 DIAGNOSIS — R0683 Snoring: Secondary | ICD-10-CM | POA: Insufficient documentation

## 2024-06-29 DIAGNOSIS — F39 Unspecified mood [affective] disorder: Secondary | ICD-10-CM

## 2024-06-29 DIAGNOSIS — G8929 Other chronic pain: Secondary | ICD-10-CM | POA: Insufficient documentation

## 2024-06-29 DIAGNOSIS — R110 Nausea: Secondary | ICD-10-CM

## 2024-06-29 DIAGNOSIS — R0602 Shortness of breath: Secondary | ICD-10-CM

## 2024-06-29 DIAGNOSIS — E118 Type 2 diabetes mellitus with unspecified complications: Secondary | ICD-10-CM

## 2024-06-29 MED ORDER — METFORMIN HCL 500 MG PO TABS: 500.0000 mg | ORAL_TABLET | Freq: Two times a day (BID) | ORAL | 3 refills | Status: DC

## 2024-06-29 MED ORDER — TIZANIDINE HCL 4 MG PO TABS: 4.0000 mg | ORAL_TABLET | Freq: Every evening | ORAL | 3 refills | Status: DC | PRN

## 2024-06-29 MED ORDER — ONDANSETRON 4 MG PO TBDP: 4.0000 mg | ORAL_TABLET | Freq: Every day | ORAL | 3 refills | Status: DC | PRN

## 2024-06-29 MED ORDER — EMPAGLIFLOZIN 10 MG PO TABS: 10.0000 mg | ORAL_TABLET | Freq: Every day | ORAL | 3 refills | Status: DC

## 2024-06-29 MED ORDER — ALBUTEROL SULFATE HFA 108 (90 BASE) MCG/ACT IN AERS: 1.0000 | INHALATION_SPRAY | Freq: Four times a day (QID) | RESPIRATORY_TRACT | 3 refills | Status: AC | PRN

## 2024-06-29 MED ORDER — DICYCLOMINE HCL 20 MG PO TABS: 20.0000 mg | ORAL_TABLET | Freq: Three times a day (TID) | ORAL | 3 refills | Status: AC | PRN

## 2024-06-29 MED ORDER — TRELEGY ELLIPTA 100-62.5-25 MCG/ACT IN AEPB: 1.0000 | INHALATION_SPRAY | Freq: Every day | RESPIRATORY_TRACT | 11 refills | Status: AC

## 2024-06-29 MED ORDER — BENZONATATE 200 MG PO CAPS: 200.0000 mg | ORAL_CAPSULE | Freq: Two times a day (BID) | ORAL | 0 refills | Status: DC | PRN

## 2024-06-29 MED ORDER — IPRATROPIUM-ALBUTEROL 0.5-2.5 (3) MG/3ML IN SOLN: 3.0000 mL | Freq: Four times a day (QID) | RESPIRATORY_TRACT | 2 refills | Status: AC | PRN

## 2024-06-29 MED ORDER — ROPINIROLE HCL 1 MG PO TABS: 1.0000 mg | ORAL_TABLET | Freq: Every evening | ORAL | 2 refills | Status: AC

## 2024-06-29 MED ORDER — ROSUVASTATIN CALCIUM 20 MG PO TABS: 20.0000 mg | ORAL_TABLET | Freq: Every evening | ORAL | 3 refills | Status: AC

## 2024-06-29 MED ORDER — ALBUTEROL SULFATE (2.5 MG/3ML) 0.083% IN NEBU: 2.5000 mg | INHALATION_SOLUTION | Freq: Four times a day (QID) | RESPIRATORY_TRACT | 1 refills | Status: DC | PRN

## 2024-06-29 MED ORDER — OZEMPIC (2 MG/DOSE) 8 MG/3ML ~~LOC~~ SOPN: 2.0000 mg | PEN_INJECTOR | SUBCUTANEOUS | 12 refills | Status: AC

## 2024-06-29 NOTE — Assessment & Plan Note (Signed)
 Chronic IBS, diarrhea predominant.  Symptoms managed with Bentyl  20 mg, TID prn. No abdominal pain today during visit - Refill Bentyl  prescription.

## 2024-06-29 NOTE — Assessment & Plan Note (Signed)
 Continue close f/u with vascular surgery, continue use of compression stockings, activities as tolerated recommended.

## 2024-06-29 NOTE — Assessment & Plan Note (Signed)
 Check iron panel, increase Ropinirole  from 0.5 mg at bedtime to 1 mg at bedtime, f/u in 3 months. Referral to sleep specialist made for OSA management.

## 2024-06-29 NOTE — Assessment & Plan Note (Signed)
-   Obesity with co morbidities, previous negative weight loss advice experience. - Encourage reduction of [facility] to one 20 oz every other day, aim to eliminate. - Encourage reduction of smoking to two packs per day with goal of no smoking - Discuss diet modification and increased physical activity.

## 2024-06-29 NOTE — Assessment & Plan Note (Signed)
 Check lipid panel, continue Rosuvastatin  20 mg daily.

## 2024-06-29 NOTE — Assessment & Plan Note (Signed)
 Suspected sleep apnea with loud snoring, body habitus, rest less legs syndrome and poor sleep quality. Refer to Dr. Jess for sleep apnea evaluation. Check iron panel.

## 2024-06-29 NOTE — Progress Notes (Signed)
 Established Patient Office Visit TOC from Dr. Hope    Subjective  Patient ID: Jasmine Larsen, female    DOB: June 04, 1966  Age: 58 y.o. MRN: [id number]  Chief Complaint  Patient presents with   Establish Care   Back Pain   Cough   Pain    She  has a past medical history of (HFpEF) heart failure with preserved ejection fraction (HCC), Acute respiratory failure with hypoxia (HCC), Anxiety, Asthma, Chest pain, COPD (chronic obstructive pulmonary disease) (HCC), DDD (degenerative disc disease), lumbar, Depression, Hepatic steatosis, History of 2019 novel coronavirus disease (COVID-19) (07/19/2020), HLD (hyperlipidemia), HTN (hypertension), Lumbosacral spinal stenosis, Miscarriage, Morbid obesity (HCC), Multiple gastric ulcers, Osteoarthritis, Peripheral edema, Pneumonia due to COVID-19 virus, Sepsis due to pneumonia (HCC), Sigmoid polyp, T2DM (type 2 diabetes mellitus) (HCC), Vitamin B12 deficiency, and Vitamin D  deficiency.  HPI Discussed the use of AI scribe software for clinical note transcription with the patient, who gave verbal consent to proceed.  History of Present Illness Jasmine Larsen is a 58 year old female with COPD, asthma,  diabetes with multiple comorbidities a/w diabetes, current everyday smoker, obesity, chronic nausea, chronic lower back pain who presents for transfer of care from previous PCP and chronic medication management and evaluation of chronic pain and respiratory symptoms.  - Chronic lower back pain with radiculopathy - Diabetic neuropathy - Restless legs - Lymphedema of lower extremities: She experiences chronic pain primarily affecting her back, legs, and knees, radiating from her back down her right thigh. She has been evaluated by neurology, ortho. She has been through physical therapy in the past. She did not have good experience with neurology so she does not want to follow up with them. She is not taking Gabapentin  for b/l lower legs diabetic  neuropathy as she perceives that to be ineffective. Describes lower legs pain/discomfort as sudden stabbing pains. She also has restless less legs. She has felt improvement in symptoms with Ropinirole  0.5 mg at nighttime and Tizanidine  4 mg at night time. She also has b/l lower legs lymphedema and sees [facility] on a regular basis, undergoing treatment for lymphedema. She is interested in seeing pain physician for further management of b/l lower back pain radiating to right leg. She has a known h/o degenerative disc disease involving L2-L5 without high-grade stenosis evident on MRI from 11/18/23, Mild degenerative disc disease at L2-L3 and L4-L5 without high-grade stenosis or impingement.   - COPD, current everyday smoker, concern for OSA as well as restrictive lung disease due to BMI: She has a history of COPD and asthma, using Trelegy and an albuterol  inhaler/nebulizer interchangeably. There is increased use of her albuterol  inhaler due to a recent cold caught from her grandchildren. Increased in cough from baseline without fever, chills.  She has a history of smoking, currently smoking about three packs a day, down from four. She has had negative experiences with a previous pulmonologist and is seeking a new specialist for her respiratory issues. She would prefer to see a female physician.  She is not interested in smoking cessation but will consider reducing amount of cigarettes she smokes daily.  She snores loudly and was recommended to go through sleep studies in the past but did not complete it. She expresses this was due to her previous experience with pulmonologist.   Low dose CT lung cancer screening:  02/06/23, annual screening recommended.   - Her diabetes management includes metformin  500 mg which she is supposed to take twice a  day but endorses only taking once a day, Jardiance  10 mg which she takes only when she remembers, and Ozempic  2 mg once weekly injection. She reports  difficulty managing her medication schedule, often forgetting to take her medications.   - She endorses of chronic nausea occurs, particularly if she does not eat within an hour of waking, and she uses Zofran  as needed for nausea. She takes 1 tab Zofran  2-3 times a week. She denies nausea occurring around the time of Ozempic  injection.    - She has a history of anxiety and depression, previously treated with Celexa , which she discontinued due to adverse effects. She does not currently take any medication for anxiety. She also had hydroxyzine  prescribed to her but is not taking it.  - Her gastrointestinal issues include IBS, for which she takes Dicyclomine  20 mg three times a day as needed when symptoms occurs.    - She also takes over-the-counter B12 supplements.  - Her social history includes smoking about three packs a day and drinking [facility] regularly, which she is trying to reduce. She has expressed frustration with previous medical care and is seeking a more supportive healthcare team.  ROS As per HPI    Objective:     BP 124/84 (BP Location: Right Arm, Patient Position: Sitting, Cuff Size: Normal)   Pulse 86   Temp 98.1 F (36.7 C) (Oral)   Wt (!) 346 lb 9.6 oz (157.2 kg)   SpO2 96%   BMI 59.49 kg/m      03/11/2024   11:35 AM 02/22/2024   11:55 AM 08/13/2023    1:38 PM  Depression screen PHQ 2/9  Decreased Interest 2 0 1  Down, Depressed, Hopeless 2 0 1  PHQ - 2 Score 4 0 2  Altered sleeping 3 2 1   Tired, decreased energy 1 1 2   Change in appetite 3 2 1   Feeling bad or failure about yourself  2 3 3   Trouble concentrating 1 0 1  Moving slowly or fidgety/restless 0 0 0  Suicidal thoughts 0 0 0  PHQ-9 Score 14 8 10   Difficult doing work/chores Not difficult at all Not difficult at all Somewhat difficult      03/11/2024   11:36 AM 08/13/2023    1:38 PM 08/06/2023    1:40 PM 03/31/2023    9:37 AM  GAD 7 : Generalized Anxiety Score  Nervous, Anxious, on Edge 2 1  0 2  Control/stop worrying 1 2 3 2   Worry too much - different things 2 2 3 2   Trouble relaxing 2 2 3 2   Restless 1 1 1 1   Easily annoyed or irritable 2 1 3  0  Afraid - awful might happen 1 1 3  0  Total GAD 7 Score 11 10 16 9   Anxiety Difficulty Not difficult at all Not difficult at all Very difficult Not difficult at all      03/11/2024   11:35 AM 02/22/2024   11:55 AM 08/13/2023    1:38 PM  Depression screen PHQ 2/9  Decreased Interest 2 0 1  Down, Depressed, Hopeless 2 0 1  PHQ - 2 Score 4 0 2  Altered sleeping 3 2 1   Tired, decreased energy 1 1 2   Change in appetite 3 2 1   Feeling bad or failure about yourself  2 3 3   Trouble concentrating 1 0 1  Moving slowly or fidgety/restless 0 0 0  Suicidal thoughts 0 0 0  PHQ-9 Score 14 8  10  Difficult doing work/chores Not difficult at all Not difficult at all Somewhat difficult      03/11/2024   11:36 AM 08/13/2023    1:38 PM 08/06/2023    1:40 PM 03/31/2023    9:37 AM  GAD 7 : Generalized Anxiety Score  Nervous, Anxious, on Edge 2 1 0 2  Control/stop worrying 1 2 3 2   Worry too much - different things 2 2 3 2   Trouble relaxing 2 2 3 2   Restless 1 1 1 1   Easily annoyed or irritable 2 1 3  0  Afraid - awful might happen 1 1 3  0  Total GAD 7 Score 11 10 16 9   Anxiety Difficulty Not difficult at all Not difficult at all Very difficult Not difficult at all   SDOH Screenings   Food Insecurity: No Food Insecurity (06/26/2024)  Housing: Low Risk  (06/26/2024)  Transportation Needs: No Transportation Needs (06/26/2024)  Utilities: Not At Risk (02/22/2024)  Alcohol Screen: Low Risk  (06/26/2024)  Depression (PHQ2-9): High Risk (03/11/2024)  Financial Resource Strain: Low Risk  (06/26/2024)  Physical Activity: Inactive (06/26/2024)  Social Connections: Socially Isolated (06/26/2024)  Stress: Stress Concern Present (06/26/2024)  Tobacco Use: High Risk (06/29/2024)  Health Literacy: Adequate Health Literacy (02/22/2024)     Physical  Exam Constitutional:      General: She is not in acute distress.    Appearance: She is obese.  HENT:     Head: Normocephalic and atraumatic.     Mouth/Throat:     Mouth: Mucous membranes are moist.  Cardiovascular:     Rate and Rhythm: Normal rate.  Pulmonary:     Effort: Pulmonary effort is normal.     Breath sounds: Wheezing (bilaterally) present.  Abdominal:     General: Abdomen is protuberant. Bowel sounds are normal.     Tenderness: There is no abdominal tenderness.  Musculoskeletal:     Cervical back: Neck supple.     Right lower leg: Edema (3+, pain on palpation) present.     Left lower leg: Edema (3+, pain on deep palpation) present.  Neurological:     Mental Status: She is oriented to person, place, and time.     Gait: Gait abnormal (on a wheelchair, slow careful gait).  Psychiatric:        Mood and Affect: Mood normal.        No results found for any visits on 06/29/24.  The 10-year ASCVD risk score (Arnett DK, et al., 2019) is: 10%     Assessment & Plan:    Type 2 diabetes mellitus with complications (HCC) -     Empagliflozin ; Take 1 tablet (10 mg total) by mouth daily before breakfast.  Dispense: 90 tablet; Refill: 3 -     metFORMIN  HCl; Take 1 tablet (500 mg total) by mouth 2 (two) times daily with a meal.  Dispense: 180 tablet; Refill: 3 -     Hemoglobin A1c -     Ozempic  (2 MG/DOSE); Inject 2 mg into the skin once a week.  Dispense: 3 mL; Refill: 12  Acute bronchitis with COPD (HCC) Assessment & Plan: Increasing cough from baseline. B/L wheezing on auscultation, no obvious crackles. Hemodynamically stable and afebrile. Encourage smoking cessation. PRN albuterol  inhaler q6 hourly for rescue (only use either inhaler or nebulizer, do not use both at the same time), Benzonatate  200 mg q8 hourly as needed for 7 days to help with cough.  Use duoneb q6 hourly for now.  Continue  once daily maintenance therapy with Trelegy Ellipta .  Strongly encourage patient  against smoking however.  Pulmonology referral made, concern for obstructive and restrictive lung disease. Appreciate pulmonology evaluation and recommendations.   Orders: -     Pulmonary Visit -     Albuterol  Sulfate; Take 3 mLs (2.5 mg total) by nebulization every 6 (six) hours as needed for wheezing or shortness of breath.  Dispense: 150 mL; Refill: 1 -     Albuterol  Sulfate HFA; Inhale 1-2 puffs into the lungs every 6 (six) hours as needed for wheezing or shortness of breath.  Dispense: 18 g; Refill: 3 -     Ipratropium-Albuterol ; Take 3 mLs by nebulization every 6 (six) hours as needed.  Dispense: 120 mL; Refill: 2 -     Trelegy Ellipta ; Inhale 1 puff into the lungs daily.  Dispense: 1 each; Refill: 11 -     Benzonatate ; Take 1 capsule (200 mg total) by mouth 2 (two) times daily as needed for cough.  Dispense: 21 capsule; Refill: 0  Hyperlipidemia associated with type 2 diabetes mellitus (HCC) Assessment & Plan: Check lipid panel, continue Rosuvastatin  20 mg daily.   Orders: -     Lipid panel -     Comprehensive metabolic panel with GFR -     Rosuvastatin  Calcium ; Take 1 tablet (20 mg total) by mouth at bedtime.  Dispense: 90 tablet; Refill: 3  Chronic midline low back pain with right-sided sciatica Assessment & Plan: Acute on chronic lower back pain with radiculopathy to right.  Known h/o mild degenerative lumbar spine disease.  Body habitus, immobility, comorbidities likely exacerbating pain.  Continue Tizanidine  4 mg at night time.  Pain clinic referral made for further evaluation and management.   Orders: -     tiZANidine  HCl; Take 1 tablet (4 mg total) by mouth at bedtime as needed for muscle spasms.  Dispense: 90 tablet; Refill: 3  Irritable bowel syndrome with diarrhea Assessment & Plan: Chronic IBS, diarrhea predominant.  Symptoms managed with Bentyl  20 mg, TID prn. No abdominal pain today during visit - Refill Bentyl  prescription.  Orders: -     Dicyclomine  HCl;  Take 1 tablet (20 mg total) by mouth 3 (three) times daily as needed for spasms.  Dispense: 90 tablet; Refill: 3  Restless leg syndrome Assessment & Plan: Check iron panel, increase Ropinirole  from 0.5 mg at bedtime to 1 mg at bedtime, f/u in 3 months. Referral to sleep specialist made for OSA management.   Orders: -     rOPINIRole  HCl; Take 1 tablet (1 mg total) by mouth at bedtime.  Dispense: 90 tablet; Refill: 2 -     Iron, TIBC and Ferritin Panel  Nausea -     Ondansetron ; Take 1 tablet (4 mg total) by mouth daily as needed for nausea or vomiting.  Dispense: 30 tablet; Refill: 3  Type 2 diabetes mellitus with diabetic neuropathy, without long-term current use of insulin  (HCC) Assessment & Plan: Type 2 diabetes with neuropathy, dyslipidemia, and potential kidney involvement. Non-adherence to Jardiance . - Continue metformin  500 mg twice daily. - Continue semaglutide  2 mg once weekly. - Resume Jardiance  daily 10 mg daily. - Encouraged use of a pill container for adherence. - Monitor blood glucose at home. - Update labs today. - Patient elected to stop Gabapentin .  Orders: -     tiZANidine  HCl; Take 1 tablet (4 mg total) by mouth at bedtime as needed for muscle spasms.  Dispense: 90 tablet; Refill: 3  Snoring Assessment &  Plan: Suspected sleep apnea with loud snoring, body habitus, rest less legs syndrome and poor sleep quality. Refer to Dr. Jess for sleep apnea evaluation. Check iron panel.   Orders: -     Pulmonary Visit  Chronic bilateral low back pain with right-sided sciatica Assessment & Plan: Acute on chronic lower back pain with radiculopathy to right.  Known h/o mild degenerative lumbar spine disease.  Body habitus, immobility, comorbidities likely exacerbating pain.  Continue Tizanidine  4 mg at night time.  Pain clinic referral made for further evaluation and management.  Orders: -     Ambulatory referral to Pain Clinic  Morbid obesity [facility]) Assessment &  Plan: - Obesity with co morbidities, previous negative weight loss advice experience. - Encourage reduction of [facility] to one 20 oz every other day, aim to eliminate. - Encourage reduction of smoking to two packs per day with goal of no smoking - Discuss diet modification and increased physical activity.   Mood disorder [facility]) Assessment & Plan: Patient declined to fill out PHQ-9, GAD-7 and further discussion    Lymphedema Assessment & Plan: Continue close f/u with vascular surgery, continue use of compression stockings, activities as tolerated recommended.      Return in about 3 months (around 09/28/2024) for Chronic, COPD, DM follow up.   Luke Shade, MD

## 2024-06-29 NOTE — Assessment & Plan Note (Signed)
 Acute on chronic lower back pain with radiculopathy to right.  Known h/o mild degenerative lumbar spine disease.  Body habitus, immobility, comorbidities likely exacerbating pain.  Continue Tizanidine  4 mg at night time.  Pain clinic referral made for further evaluation and management.

## 2024-06-29 NOTE — Assessment & Plan Note (Addendum)
 Type 2 diabetes with neuropathy, dyslipidemia, and potential kidney involvement. Non-adherence to Jardiance . - Continue metformin  500 mg twice daily. - Continue semaglutide  2 mg once weekly. - Resume Jardiance  daily 10 mg daily. - Encouraged use of a pill container for adherence. - Monitor blood glucose at home. - Update labs today. - Patient elected to stop Gabapentin .

## 2024-06-29 NOTE — Assessment & Plan Note (Signed)
 Increasing cough from baseline. B/L wheezing on auscultation, no obvious crackles. Hemodynamically stable and afebrile. Encourage smoking cessation. PRN albuterol  inhaler q6 hourly for rescue (only use either inhaler or nebulizer, do not use both at the same time), Benzonatate  200 mg q8 hourly as needed for 7 days to help with cough.  Use duoneb q6 hourly for now.  Continue once daily maintenance therapy with Trelegy Ellipta .  Strongly encourage patient against smoking however.  Pulmonology referral made, concern for obstructive and restrictive lung disease. Appreciate pulmonology evaluation and recommendations.

## 2024-06-29 NOTE — Assessment & Plan Note (Signed)
 Patient declined to fill out PHQ-9, GAD-7 and further discussion

## 2024-06-29 NOTE — Patient Instructions (Addendum)
-   RSV, Shingles vaccine: recommend updating at the pharmacy.   - I have sent a new referral to pulmonology to Dr. Tamea, please let me know if there is problem with getting to see her.   - I am referring you to sleep clinic to see Dr. Jess for sleep apnea evaluation. I am also referring you to [facility] pain clinic.   - I am increasing the dose of Ropinirole  from 0.5 mg at bedtime to 1 mg at bedtime. I am also sending refill on Tizanidine  take 4 mg at bedtime as needed. I also sent prescription for Zofran , please take one tablet as needed daily, if you are needing more than one tablet per day we will need to reevaluate.   - Use Albuterol  inhaler/ nebulizer every 6 hourly as needed.  Use Duoneb every 6 hourly as needed when you have worsening cough, wheezing. Once you feel better you don't have to stay on it.  Use Treligy once daily.  I sent prescription for Benzonatate , take 1 capsule every 8 hourly for 7 days to help with cough. If you don't feel better we need to reevaluate you.   - Take Ozempic  2 mg once weekly injection, Metformin  500 mg, twice a day and Jardiance  10 mg daily.

## 2024-06-30 ENCOUNTER — Telehealth: Payer: Self-pay

## 2024-06-30 ENCOUNTER — Other Ambulatory Visit: Payer: Self-pay

## 2024-06-30 ENCOUNTER — Ambulatory Visit: Payer: Self-pay

## 2024-06-30 DIAGNOSIS — J209 Acute bronchitis, unspecified: Secondary | ICD-10-CM

## 2024-06-30 DIAGNOSIS — G2581 Restless legs syndrome: Secondary | ICD-10-CM

## 2024-06-30 DIAGNOSIS — M5416 Radiculopathy, lumbar region: Secondary | ICD-10-CM

## 2024-06-30 DIAGNOSIS — R0602 Shortness of breath: Secondary | ICD-10-CM

## 2024-06-30 DIAGNOSIS — W19XXXD Unspecified fall, subsequent encounter: Secondary | ICD-10-CM

## 2024-06-30 DIAGNOSIS — Z720 Tobacco use: Secondary | ICD-10-CM

## 2024-06-30 LAB — IRON,TIBC AND FERRITIN PANEL
%SAT: 16 % (ref 16–45)
Ferritin: 108 ng/mL (ref 16–232)
Iron: 47 ug/dL (ref 45–160)
TIBC: 289 ug/dL (ref 250–450)

## 2024-06-30 LAB — LIPID PANEL
Cholesterol: 207 mg/dL — ABNORMAL HIGH (ref 0–200)
HDL: 38.6 mg/dL — ABNORMAL LOW (ref >39.00)
LDL Cholesterol: 151 mg/dL — ABNORMAL HIGH (ref 0–99)
NonHDL: 168.61
Total CHOL/HDL Ratio: 5
Triglycerides: 87 mg/dL (ref 0.0–149.0)
VLDL: 17.4 mg/dL (ref 0.0–40.0)

## 2024-06-30 LAB — COMPREHENSIVE METABOLIC PANEL WITH GFR
ALT: 13 U/L (ref 0–35)
AST: 11 U/L (ref 0–37)
Albumin: 4.2 g/dL (ref 3.5–5.2)
Alkaline Phosphatase: 102 U/L (ref 39–117)
BUN: 12 mg/dL (ref 6–23)
CO2: 28 meq/L (ref 19–32)
Calcium: 9.1 mg/dL (ref 8.4–10.5)
Chloride: 99 meq/L (ref 96–112)
Creatinine, Ser: 0.65 mg/dL (ref 0.40–1.20)
GFR: 98.4 mL/min (ref >60.00)
Glucose, Bld: 120 mg/dL — ABNORMAL HIGH (ref 70–99)
Potassium: 4.4 meq/L (ref 3.5–5.1)
Sodium: 134 meq/L — ABNORMAL LOW (ref 135–145)
Total Bilirubin: 0.5 mg/dL (ref 0.2–1.2)
Total Protein: 7.2 g/dL (ref 6.0–8.3)

## 2024-06-30 LAB — HEMOGLOBIN A1C: Hgb A1c MFr Bld: 7.6 % — ABNORMAL HIGH (ref 4.6–6.5)

## 2024-06-30 MED ORDER — ALBUTEROL SULFATE (2.5 MG/3ML) 0.083% IN NEBU: 2.5000 mg | INHALATION_SOLUTION | Freq: Four times a day (QID) | RESPIRATORY_TRACT | 1 refills | Status: DC | PRN

## 2024-06-30 NOTE — Addendum Note (Signed)
 Addended by: ANICE BELT on: 06/30/2024 11:29 AM   Modules accepted: Orders

## 2024-06-30 NOTE — Telephone Encounter (Signed)
 Copied from CRM [id number]. Topic: Clinical - Order For Equipment >> Jun 30, 2024 10:58 AM Charolett L wrote: Reason for CRM: [employer] tried to deliver a standard rollaide but patient is over 300lbs and she needs a bariatric rollaide. Whitney from [employer] stated that the new order form needs to be faxed to her. The form must say Bariatric to accommodate the patient weight

## 2024-06-30 NOTE — Telephone Encounter (Signed)
 DME order for Bariatric Rolator form signed and will be faxed to [employer].   Luke Shade, MD

## 2024-07-07 ENCOUNTER — Encounter: Payer: Self-pay | Admitting: Sleep Medicine

## 2024-07-07 ENCOUNTER — Ambulatory Visit: Admitting: Sleep Medicine

## 2024-07-07 VITALS — BP 132/84 | HR 82 | Temp 97.5°F | Ht 64.0 in | Wt 346.0 lb

## 2024-07-07 DIAGNOSIS — G4733 Obstructive sleep apnea (adult) (pediatric): Secondary | ICD-10-CM

## 2024-07-07 DIAGNOSIS — Z6841 Body Mass Index (BMI) 40.0 and over, adult: Secondary | ICD-10-CM | POA: Diagnosis not present

## 2024-07-07 DIAGNOSIS — F5104 Psychophysiologic insomnia: Secondary | ICD-10-CM

## 2024-07-07 NOTE — Progress Notes (Signed)
 Name:Jasmine Larsen MRN: [id number] DOB: Jan 01, 1966   CHIEF COMPLAINT:  EXCESSIVE DAYTIME SLEEPINESS   HISTORY OF PRESENT ILLNESS: Ms. Flight is a 58 y.o. w/ a h/o COPD, DMII, hyperlipidemia and morbid obesity who presents for c/o loud snoring and excessive daytime sleepiness which has been present for several years. Reports nocturnal awakenings due to unclear reasons and has difficulty falling back to sleep. Reports significant weight changes. Denies morning headaches, RLS symptoms, dream enactment, cataplexy, hypnagogic or hypnapompic hallucinations. Reports a family history of sleep apnea. Denies drowsy driving. Drinks a 2 L of soda daily, denies alcohol or illicit drug use. Smokes 3 packs of cigarettes daily.   Bedtime 11 pm- 3 am Sleep onset varies Rise time 7-8 am   EPWORTH SLEEP SCORE 6    07/07/2024   10:24 AM  Results of the Epworth flowsheet  Sitting and reading 0  Watching TV 1  Sitting, inactive in a public place (e.g. a theatre or a meeting) 1  As a passenger in a car for an hour without a break 1  Lying down to rest in the afternoon when circumstances permit 3  Sitting and talking to someone 0  Sitting quietly after a lunch without alcohol 0  In a car, while stopped for a few minutes in traffic 0  Total score 6     PAST MEDICAL HISTORY :   has a past medical history of (HFpEF) heart failure with preserved ejection fraction (HCC), Acute respiratory failure with hypoxia (HCC), Anxiety, Asthma, Chest pain, COPD (chronic obstructive pulmonary disease) (HCC), DDD (degenerative disc disease), lumbar, Depression, Hepatic steatosis, History of 2019 novel coronavirus disease (COVID-19) (07/19/2020), HLD (hyperlipidemia), HTN (hypertension), Lumbosacral spinal stenosis, Miscarriage, Morbid obesity (HCC), Multiple gastric ulcers, Osteoarthritis, Peripheral edema, Pneumonia due to COVID-19 virus, Sepsis due to pneumonia (HCC), Sigmoid polyp, T2DM (type 2 diabetes  mellitus) (HCC), Vitamin B12 deficiency, and Vitamin D  deficiency.  has a past surgical history that includes Cesarean section; Colonoscopy with propofol  (N/A, 09/21/2023); polypectomy (09/21/2023); Mass excision (Right, 10/28/2023); and Tubal ligation (April 21 1991). Prior to Admission medications   Medication Sig Start Date End Date Taking? Authorizing Provider  Accu-Chek Softclix Lancets lancets 1 each by Other route daily. 03/11/24  Yes Hope Merle, MD  albuterol  (PROVENTIL ) (2.5 MG/3ML) 0.083% nebulizer solution Take 3 mLs (2.5 mg total) by nebulization every 6 (six) hours as needed for wheezing or shortness of breath. 06/30/24  Yes Bair, Kalpana, MD  albuterol  (VENTOLIN  HFA) 108 (90 Base) MCG/ACT inhaler Inhale 1-2 puffs into the lungs every 6 (six) hours as needed for wheezing or shortness of breath. 06/29/24  Yes Bair, Kalpana, MD  benzonatate  (TESSALON ) 200 MG capsule Take 1 capsule (200 mg total) by mouth 2 (two) times daily as needed for cough. 06/29/24  Yes Bair, Kalpana, MD  blood glucose meter kit and supplies KIT Use up to 4 times daily. 08/17/23  Yes Hope Merle, MD  Blood Glucose Monitoring Suppl DEVI 1 each by Does not apply route as directed. May substitute to any manufacturer covered by patient's insurance. 08/13/23  Yes Hope Merle, MD  cyanocobalamin  (VITAMIN B12) 1000 MCG tablet Take 1 tablet (1,000 mcg total) by mouth daily. 08/09/23  Yes Hope Merle, MD  dicyclomine  (BENTYL ) 20 MG tablet Take 1 tablet (20 mg total) by mouth 3 (three) times daily as needed for spasms. 06/29/24  Yes Bair, Kalpana, MD  empagliflozin  (JARDIANCE ) 10 MG TABS tablet Take 1 tablet (10 mg total) by  mouth daily before breakfast. 06/29/24  Yes Bair, Kalpana, MD  Fluticasone -Umeclidin-Vilant (TRELEGY ELLIPTA ) 100-62.5-25 MCG/ACT AEPB Inhale 1 puff into the lungs daily. 06/29/24  Yes Bair, Kalpana, MD  Glucose Blood (BLOOD GLUCOSE TEST STRIPS) STRP 1 each by In Vitro route daily. May substitute to any  manufacturer covered by patient's insurance. 07/22/23  Yes Wendee Lynwood HERO, NP  ipratropium-albuterol  (DUONEB) 0.5-2.5 (3) MG/3ML SOLN Take 3 mLs by nebulization every 6 (six) hours as needed. 06/29/24  Yes Bair, Kalpana, MD  metFORMIN  (GLUCOPHAGE ) 500 MG tablet Take 1 tablet (500 mg total) by mouth 2 (two) times daily with a meal. 06/29/24  Yes Bair, Luke, MD  ondansetron  (ZOFRAN -ODT) 4 MG disintegrating tablet Take 1 tablet (4 mg total) by mouth daily as needed for nausea or vomiting. 06/29/24  Yes Bair, Kalpana, MD  rOPINIRole  (REQUIP ) 1 MG tablet Take 1 tablet (1 mg total) by mouth at bedtime. 06/29/24  Yes Bair, Kalpana, MD  rosuvastatin  (CRESTOR ) 20 MG tablet Take 1 tablet (20 mg total) by mouth at bedtime. 06/29/24  Yes Bair, Kalpana, MD  Semaglutide , 2 MG/DOSE, (OZEMPIC , 2 MG/DOSE,) 8 MG/3ML SOPN Inject 2 mg into the skin once a week. 06/29/24  Yes Bair, Kalpana, MD  tiZANidine  (ZANAFLEX ) 4 MG tablet Take 1 tablet (4 mg total) by mouth at bedtime as needed for muscle spasms. 06/29/24  Yes Bair, Kalpana, MD   Allergies  Allergen Reactions   Cymbalta  [Duloxetine  Hcl]     SI thoughts    FAMILY HISTORY:  family history includes Alcohol abuse in her maternal grandmother; Asthma in her mother; COPD in her brother and mother; Congestive Heart Failure in her brother; Diabetes in her brother and mother; Early death in her brother; Heart Problems in her mother; Lung cancer in her maternal grandmother; Tuberculosis in her maternal grandmother. SOCIAL HISTORY:  reports that she has been smoking cigarettes. She started smoking about 42 years ago. She has a 128.2 pack-year smoking history. She has been exposed to tobacco smoke. She has never used smokeless tobacco. She reports that she does not drink alcohol and does not use drugs.   Review of Systems:  Gen:  Denies  fever, sweats, chills weight loss  HEENT: Denies blurred vision, double vision, ear pain, eye pain, hearing loss, nose bleeds, sore  throat Cardiac:  No dizziness, chest pain or heaviness, chest tightness,edema, No JVD Resp:   No cough, -sputum production, -shortness of breath,-wheezing, -hemoptysis,  Gi: Denies swallowing difficulty, stomach pain, nausea or vomiting, diarrhea, constipation, bowel incontinence Gu:  Denies bladder incontinence, burning urine Ext:   Denies Joint pain, stiffness or swelling Skin: Denies  skin rash, easy bruising or bleeding or hives Endoc:  Denies polyuria, polydipsia , polyphagia or weight change Psych:   Denies depression, insomnia or hallucinations  Other:  All other systems negative  VITAL SIGNS: BP 132/84   Pulse 82   Temp (!) 97.5 F (36.4 C)   Ht 5' 4 (1.626 m)   Wt (!) 346 lb (156.9 kg) Comment: per patient in a wheelchair today  SpO2 97%   BMI 59.39 kg/m    Physical Examination:   General Appearance: No distress  EYES PERRLA, EOM intact.   NECK Supple, No JVD Pulmonary: normal breath sounds, No wheezing.  CardiovascularNormal S1,S2.  No m/r/g.   Abdomen: Benign, Soft, non-tender. Skin:   warm, no rashes, no ecchymosis  Extremities: normal, no cyanosis, clubbing. Neuro:without focal findings,  speech normal  PSYCHIATRIC: Mood, affect within normal limits.  ASSESSMENT AND PLAN  OSA I suspect that OSA is likely present due to clinical presentation. Discussed the consequences of untreated sleep apnea. Advised not to drive drowsy for safety of patient and others. Will complete further evaluation with a split night study and follow up to review results.    Morbid obesity Counseled patient on diet and lifestyle modification.  Insomnia Counseled patient on stimulus control and improving    MEDICATION ADJUSTMENTS/LABS AND TESTS ORDERED: Recommend Sleep Study   Patient  satisfied with Plan of action and management. All questions answered  Follow up to review sleep study results and treatment plan.   I spent a total of 64 minutes reviewing chart data,  face-to-face evaluation with the patient, counseling and coordination of care as detailed above.    Kentrell Guettler, M.D.  Sleep Medicine [facility]

## 2024-07-07 NOTE — Patient Instructions (Signed)
 Will complete an in lab sleep study and follow up to review results.

## 2024-07-13 ENCOUNTER — Other Ambulatory Visit: Payer: Self-pay

## 2024-07-13 DIAGNOSIS — R0602 Shortness of breath: Secondary | ICD-10-CM

## 2024-07-13 DIAGNOSIS — Z720 Tobacco use: Secondary | ICD-10-CM

## 2024-07-13 DIAGNOSIS — J441 Chronic obstructive pulmonary disease with (acute) exacerbation: Secondary | ICD-10-CM

## 2024-07-13 NOTE — Telephone Encounter (Unsigned)
 Copied from CRM #[id number]. Topic: Clinical - Order For Equipment >> Jul 13, 2024 11:29 AM Alfonso HERO wrote: Reason for CRM: Patient calling because an order was supposed to be sent to [employer] for a Bariatric roll aide with seat but it was sent for a wheelchair instead. Can a new order be sent to the company for the Bariatric roll aid with seat? In the note section please do not put wheelchair because that's not what she is needing. She needs a Bariatric Roll aid with seat.

## 2024-07-13 NOTE — Telephone Encounter (Signed)
 Patient DME order was update and faxed over to [employer]. Patient will be notified via MyChart.

## 2024-07-14 ENCOUNTER — Ambulatory Visit: Admitting: Pulmonary Disease

## 2024-07-28 ENCOUNTER — Ambulatory Visit (INDEPENDENT_AMBULATORY_CARE_PROVIDER_SITE_OTHER): Admitting: Pulmonary Disease

## 2024-07-28 ENCOUNTER — Encounter: Payer: Self-pay | Admitting: Pulmonary Disease

## 2024-07-28 VITALS — BP 128/80 | HR 75 | Temp 98.1°F | Ht 64.0 in | Wt 352.4 lb

## 2024-07-28 DIAGNOSIS — E669 Obesity, unspecified: Secondary | ICD-10-CM

## 2024-07-28 DIAGNOSIS — J449 Chronic obstructive pulmonary disease, unspecified: Secondary | ICD-10-CM

## 2024-07-28 DIAGNOSIS — F17210 Nicotine dependence, cigarettes, uncomplicated: Secondary | ICD-10-CM

## 2024-07-28 DIAGNOSIS — Z2821 Immunization not carried out because of patient refusal: Secondary | ICD-10-CM

## 2024-07-28 DIAGNOSIS — Z6841 Body Mass Index (BMI) 40.0 and over, adult: Secondary | ICD-10-CM

## 2024-07-28 DIAGNOSIS — R0602 Shortness of breath: Secondary | ICD-10-CM | POA: Diagnosis not present

## 2024-07-28 DIAGNOSIS — R0989 Other specified symptoms and signs involving the circulatory and respiratory systems: Secondary | ICD-10-CM

## 2024-07-28 MED ORDER — ACCU-CHEK GUIDE TEST VI STRP: ORAL_STRIP | 1 refills | Status: DC

## 2024-07-28 NOTE — Patient Instructions (Addendum)
 VISIT SUMMARY:  During your visit, we discussed your ongoing issues with shortness of breath, which is likely related to your COPD, morbid obesity, and long-term smoking. We reviewed your current medications and planned further tests to better understand your condition. We also talked about the importance of smoking cessation and weight management to improve your overall health.  YOUR PLAN:  -CHRONIC OBSTRUCTIVE PULMONARY DISEASE (COPD): COPD is a chronic lung disease that makes it hard to breathe. You should continue using Trelegy as it helps manage your symptoms. We will schedule a formal pulmonary function test to assess your current lung function. Pulmonary rehabilitation may also be beneficial.  -MORBID OBESITY: Morbid obesity means having a very high body weight, which can affect your breathing and overall health. It's important to work on weight loss and increase your physical activity. Using bariatric walker can help with this.  -TOBACCO USE DISORDER: Tobacco use disorder is a dependence on smoking, which severely impacts your lung function and overall health. Quitting smoking is crucial for improving your respiratory health. We discussed the risks of continued smoking and the importance of cessation.  -SUSPECTED OBSTRUCTIVE SLEEP APNEA: Obstructive sleep apnea is a condition where your breathing stops and starts during sleep. You have a sleep study scheduled for November 15, which will help us  determine the next steps for your treatment.  -SHORTNESS OF BREATH: Your shortness of breath is likely due to COPD, obesity, and smoking. Continue using Trelegy and your rescue inhaler as needed. Pulmonary rehabilitation may also help improve your breathing.  INSTRUCTIONS:  Please continue using Trelegy and your rescue inhaler as needed. Schedule and complete the formal pulmonary function test. Proceed with your sleep study on November 15. Focus on weight loss and increasing physical activity with  the help of bariatric walker. Most importantly, work on quitting smoking to improve your respiratory health.

## 2024-07-28 NOTE — Progress Notes (Signed)
 Subjective:    Patient ID: Jasmine Larsen, female    DOB: January 27, 1966, 58 y.o.   MRN: [id number]  Patient Care Team: Bair, Kalpana, MD as PCP - General [facility])  Chief Complaint  Patient presents with   Medical Management of Chronic Issues    Cough, wheezing and shortness of breath.     BACKGROUND:[name] is a 58 year old female patient with a past medical history of diabetes mellitus type 2, morbid obesity, presumed COPD, peripheral neuropathy, hyperlipidemia and heavy tobacco use presenting today to transfer care from Dr. Malka.  Patient was initially evaluated by Dr. Malka on 04 September 2023.  Unclear reasons the patient is transferring her care to me.  HPI Discussed the use of AI scribe software for clinical note transcription with the patient, who gave verbal consent to proceed.  History of Present Illness   Jasmine Larsen is a 58 year old female with presumed COPD who presents with shortness of breath. She was previously evaluated by Dr. Malka.  She experiences shortness of breath, which she attributes to her long history of smoking since the age of [age]. She currently smokes two to three packs of cigarettes per day. She is concerned about her oxygen levels and has a sleep study scheduled for August 27, 2024, as she has not been diagnosed with sleep apnea.  She has been evaluated by Dr. Jess for her potential sleep apnea.  She uses a rescue inhaler for acute episodes of shortness of breath and is currently on Trelegy, which helps manage her symptoms. She has not received a flu shot this year and does not wish to have it today.  Her weight has fluctuated, describing it as 'teetered up and down.' She is working on cutting back on smoking.  Since her initial visit to the clinic in November 2024 she has gained an additional 15 pounds.  Current BMI is 60.49.  She recalls having a breathing test done in February, but the results are not available in  the current records. She has not had a recent pulmonary function test.    Review of Systems A 10 point review of systems was performed and it is as noted above otherwise negative.   Past Medical History:  Diagnosis Date   (HFpEF) heart failure with preserved ejection fraction (HCC)    a.) TTE 07/14/2016: EF 65-70%, no RWMAs, norm RVSF, mild MR; b.) TTE 08/12/2023: EF 55-60%, no RWMAs, G1DD, norm RVSF   Acute respiratory failure with hypoxia (HCC)    Anxiety    Asthma    Chest pain    COPD (chronic obstructive pulmonary disease) (HCC)    DDD (degenerative disc disease), lumbar    Depression    Hepatic steatosis    History of 2019 novel coronavirus disease (COVID-19) 07/19/2020   HLD (hyperlipidemia)    HTN (hypertension)    Lumbosacral spinal stenosis    Miscarriage    Morbid obesity (HCC)    Multiple gastric ulcers    Osteoarthritis    Peripheral edema    Pneumonia due to COVID-19 virus    Sepsis due to pneumonia (HCC)    Sigmoid polyp    T2DM (type 2 diabetes mellitus) (HCC)    Vitamin B12 deficiency    Vitamin D  deficiency     Past Surgical History:  Procedure Laterality Date   CESAREAN SECTION     x3   COLONOSCOPY WITH PROPOFOL  N/A 09/21/2023   Procedure: COLONOSCOPY WITH PROPOFOL ;  Surgeon: Unk Cotton  Jess, MD;  Location: [facility] ENDOSCOPY;  Service: Gastroenterology;  Laterality: N/A;   MASS EXCISION Right 10/28/2023   Procedure: INCISION AND REMOVAL OF RIGHT FOREARM MASS;  Surgeon: Edie Norleen PARAS, MD;  Location: [facility] ORS;  Service: Orthopedics;  Laterality: Right;   POLYPECTOMY  09/21/2023   Procedure: POLYPECTOMY;  Surgeon: Unk Corinn Jess, MD;  Location: [facility] ENDOSCOPY;  Service: Gastroenterology;;   TUBAL LIGATION  April 21 1991    Patient Active Problem List   Diagnosis Date Noted   Snoring 06/29/2024   Chronic bilateral low back pain with right-sided sciatica 06/29/2024   Nausea 03/15/2024   IBS (irritable bowel syndrome) 02/19/2024   Mood disorder  02/19/2024   Elevated blood pressure reading 02/19/2024   Restless leg syndrome 12/20/2023   Need for influenza vaccination 12/20/2023   Encounter for immunization 12/20/2023   Hyponatremia 11/19/2023   Left foot pain 11/19/2023   Lymphedema 11/17/2023   Polyp of sigmoid colon 09/21/2023   Polyp of rectum 09/21/2023   Encounter for screening colonoscopy 09/21/2023   Burning with urination 08/09/2023   Colon cancer screening 08/09/2023   Need for hepatitis C screening test 08/09/2023   Encounter for screening for HIV 08/09/2023   Diabetic eye exam (HCC) 08/09/2023   Type 2 diabetes mellitus with complications (HCC) 08/09/2023   Hyperlipidemia associated with type 2 diabetes mellitus (HCC) 08/09/2023   Congestive heart failure (HCC) 08/09/2023   Vitamin D  deficiency 08/09/2023   Vitamin B 12 deficiency 08/09/2023   Type 2 diabetes mellitus with diabetic neuropathy, without long-term current use of insulin  (HCC) 08/09/2023   Bilateral sciatica 08/09/2023   Acute bronchitis with COPD (HCC) 08/09/2023   Calcaneal spur of both feet 08/09/2023   Left wrist pain 07/06/2023   Fall 07/01/2023   Multiple lacerations 07/01/2023   COPD with acute exacerbation (HCC) 04/27/2023   Degeneration of lumbar intervertebral disc 03/18/2023   Lumbar radiculopathy 03/18/2023   Acute pain of left shoulder 01/26/2023   Uncontrolled type 2 diabetes mellitus with hyperglycemia (HCC) 12/15/2022   Acute midline low back pain with left-sided sciatica 12/15/2022   Lower extremity edema 12/15/2022   Anxiety and depression 12/15/2022   Pneumonia due to COVID-19 virus 07/19/2020   Chronic diastolic CHF (congestive heart failure) (HCC) 07/19/2020   Hypokalemia 07/19/2020   Hypocalcemia 07/19/2020   Tobacco abuse 07/19/2020   Hypophosphatemia 07/19/2020   Shortness of breath 07/13/2016   Morbid obesity (HCC) 07/13/2016    Family History  Problem Relation Age of Onset   Asthma Mother    Diabetes Mother     COPD Mother    Heart Problems Mother    Diabetes Brother    COPD Brother    Congestive Heart Failure Brother    Early death Brother    Alcohol abuse Maternal Grandmother    Tuberculosis Maternal Grandmother    Lung cancer Maternal Grandmother     Social History   Tobacco Use   Smoking status: Every Day    Current packs/day: 3.00    Average packs/day: 3.0 packs/day for 42.8 years (128.4 ttl pk-yrs)    Types: Cigarettes    Start date: 1983    Passive exposure: Past   Smokeless tobacco: Never   Tobacco comments:    Smokes 3 PPD - khj 07/07/2024  Substance Use Topics   Alcohol use: No    Allergies  Allergen Reactions   Cymbalta  [Duloxetine  Hcl]     SI thoughts    Current Meds  Medication Sig  Accu-Chek Softclix Lancets lancets 1 each by Other route daily.   albuterol  (PROVENTIL ) (2.5 MG/3ML) 0.083% nebulizer solution Take 3 mLs (2.5 mg total) by nebulization every 6 (six) hours as needed for wheezing or shortness of breath.   albuterol  (VENTOLIN  HFA) 108 (90 Base) MCG/ACT inhaler Inhale 1-2 puffs into the lungs every 6 (six) hours as needed for wheezing or shortness of breath.   benzonatate  (TESSALON ) 200 MG capsule Take 1 capsule (200 mg total) by mouth 2 (two) times daily as needed for cough.   blood glucose meter kit and supplies KIT Use up to 4 times daily.   Blood Glucose Monitoring Suppl DEVI 1 each by Does not apply route as directed. May substitute to any manufacturer covered by patient's insurance.   cyanocobalamin  (VITAMIN B12) 1000 MCG tablet Take 1 tablet (1,000 mcg total) by mouth daily.   dicyclomine  (BENTYL ) 20 MG tablet Take 1 tablet (20 mg total) by mouth 3 (three) times daily as needed for spasms.   empagliflozin  (JARDIANCE ) 10 MG TABS tablet Take 1 tablet (10 mg total) by mouth daily before breakfast.   Fluticasone -Umeclidin-Vilant (TRELEGY ELLIPTA ) 100-62.5-25 MCG/ACT AEPB Inhale 1 puff into the lungs daily.   ipratropium-albuterol  (DUONEB) 0.5-2.5 (3)  MG/3ML SOLN Take 3 mLs by nebulization every 6 (six) hours as needed.   metFORMIN  (GLUCOPHAGE ) 500 MG tablet Take 1 tablet (500 mg total) by mouth 2 (two) times daily with a meal.   ondansetron  (ZOFRAN -ODT) 4 MG disintegrating tablet Take 1 tablet (4 mg total) by mouth daily as needed for nausea or vomiting.   rOPINIRole  (REQUIP ) 1 MG tablet Take 1 tablet (1 mg total) by mouth at bedtime.   rosuvastatin  (CRESTOR ) 20 MG tablet Take 1 tablet (20 mg total) by mouth at bedtime.   Semaglutide , 2 MG/DOSE, (OZEMPIC , 2 MG/DOSE,) 8 MG/3ML SOPN Inject 2 mg into the skin once a week.   tiZANidine  (ZANAFLEX ) 4 MG tablet Take 1 tablet (4 mg total) by mouth at bedtime as needed for muscle spasms.   [DISCONTINUED] Glucose Blood (BLOOD GLUCOSE TEST STRIPS) STRP 1 each by In Vitro route daily. May substitute to any manufacturer covered by patient's insurance.    Immunization History  Administered Date(s) Administered   Influenza, Seasonal, Injecte, Preservative Fre 12/11/2023   PNEUMOCOCCAL CONJUGATE-20 12/11/2023   Tdap 07/01/2023        Objective:     BP 128/80   Pulse 75   Temp 98.1 F (36.7 C) (Temporal)   Ht 5' 4 (1.626 m)   Wt (!) 352 lb 6.4 oz (159.8 kg)   SpO2 97%   BMI 60.49 kg/m   SpO2: 97 %  GENERAL: Morbidly obese woman, no acute distress, presents in transport chair.  No conversational dyspnea. HEAD: Normocephalic, atraumatic.  EYES: Pupils equal, round, reactive to light.  No scleral icterus.  MOUTH: Edentulous upper, poor dentition with missing teeth lower.  Oral mucosa moist. NECK: Supple. No thyromegaly. Trachea midline. No JVD.  No adenopathy. PULMONARY: Good air entry bilaterally.  Coarse, otherwise, no adventitious sounds. CARDIOVASCULAR: S1 and S2. Regular rate and rhythm.  No rubs, murmurs or gallops heard. ABDOMEN: Obese, otherwise unremarkable. MUSCULOSKELETAL: No joint deformity, no clubbing, no edema.  NEUROLOGIC: No overt focal deficit. SKIN:  Intact,warm,dry. PSYCH: Mood and behavior normal.   Assessment & Plan:     ICD-10-CM   1. COPD suggested by initial evaluation  R09.89 Pulmonary function test    2. Shortness of breath  R06.02 Pulmonary function test  3. Extreme obesity  E66.9     4. Tobacco dependence due to cigarettes  F17.210     5. Influenza vaccination declined  Z28.21       Orders Placed This Encounter  Procedures   Pulmonary function test    Standing Status:   Future    Expiration Date:   07/28/2025    Scheduling Instructions:     Spiro pre and post + DLCO    Where should this test be performed?:   Outpatient Pulmonary    What type of PFT is being ordered?:   Simple Spirometry w/DLCO, no bronchodilator   Discussion:    Chronic obstructive pulmonary disease (COPD) COPD with shortness of breath managed with Trelegy. Trelegy is effective in controlling symptoms. A formal breathing test is needed to assess current pulmonary function as previous results are not available. - Continue Trelegy - Schedule formal pulmonary function test - Consider pulmonary rehabilitation  Morbid obesity Morbid obesity contributing to respiratory issues. Weight management is crucial for improving respiratory function and overall health. She is working on increasing activity levels with the aid of a bariatric walker. - Encourage weight loss - Promote increased physical activity with bariatric walker  Tobacco use disorder Long-standing tobacco use disorder with current consumption of two to three packs per day. Smoking cessation is critical for improving respiratory health and overall well-being. Smoking impacts respiratory muscles and lung function, emphasizing the need for cessation. - Advise smoking cessation - Discuss risks of continued smoking on respiratory health  Suspected obstructive sleep apnea Suspected obstructive sleep apnea with a sleep study scheduled for November 15. The outcome of the sleep study will  guide further management, particularly regarding oxygen therapy and its impact on weight loss and activity levels. - Proceed with scheduled sleep study on November 15 - Follow-up with Dr. Jess with regards to potential sleep apnea  Shortness of breath Shortness of breath likely related to COPD and exacerbated by morbid obesity and tobacco use. Managed with Trelegy and rescue inhaler as needed. Pulmonary rehabilitation may be beneficial. - Continue Trelegy and rescue inhaler as needed - Consider pulmonary rehabilitation, will need to engage in weight loss prior to enrolling  Smoking/Tobacco Cessation Counseling SAMORA JERNBERG is a current user of tobacco or nicotine  products. She is considering quitting at this time. Counseling provided today addressed the risks of continued use and the benefits of cessation. Discussed tobacco/nicotine  use history, readiness to quit, and evidence-based treatment options including behavioral strategies, support resources, and pharmacologic therapies. Provided encouragement and educational materials on steps and resources to quit smoking. Patient questions were addressed, and follow-up recommended for continued support. Total time spent on counseling: 6 minutes.   Follow-up in 3 months time call sooner should any problems arise.  Advised if symptoms do not improve or worsen, to please contact office for sooner follow up or seek emergency care.    I spent 45 minutes of dedicated to the care of this patient on the date of this encounter to include pre-visit review of records, face-to-face time with the patient discussing conditions above, post visit ordering of testing, clinical documentation with the electronic health record, making appropriate referrals as documented, and communicating necessary findings to members of the patients care team.   C. Leita Sanders, MD Advanced Bronchoscopy PCCM [facility]    *This note was dictated using voice  recognition software/Dragon.  Despite best efforts to proofread, errors can occur which can change the meaning. Any transcriptional errors that result from  this process are unintentional and may not be fully corrected at the time of dictation.

## 2024-07-29 ENCOUNTER — Telehealth: Payer: Self-pay

## 2024-07-29 ENCOUNTER — Other Ambulatory Visit: Payer: Self-pay

## 2024-07-29 DIAGNOSIS — E118 Type 2 diabetes mellitus with unspecified complications: Secondary | ICD-10-CM

## 2024-07-29 DIAGNOSIS — E1165 Type 2 diabetes mellitus with hyperglycemia: Secondary | ICD-10-CM

## 2024-07-29 MED ORDER — BLOOD GLUCOSE TEST VI STRP: 1.0000 | ORAL_STRIP | Freq: Every day | 2 refills | Status: DC

## 2024-07-29 MED ORDER — ACCU-CHEK GUIDE TEST VI STRP: ORAL_STRIP | 1 refills | Status: DC

## 2024-07-29 NOTE — Addendum Note (Signed)
 Addended by: Dejay Kronk on: 07/29/2024 04:41 PM   Modules accepted: Orders

## 2024-07-29 NOTE — Telephone Encounter (Signed)
 Copied from CRM #[id number]. Topic: Clinical - Prescription Issue >> Jul 29, 2024  1:40 PM Dedra B wrote: Reason for CRM: Jasmine from [facility] received the request for test strips. However, it did not have a diagnosis code and Medicare requires one. Pls send a new prescription with diagnosis code to [facility].

## 2024-07-29 NOTE — Telephone Encounter (Signed)
 Strips resent:

## 2024-07-29 NOTE — Addendum Note (Signed)
 Addended by: Ingra Rother on: 07/29/2024 04:47 PM   Modules accepted: Orders

## 2024-08-02 ENCOUNTER — Encounter: Payer: Self-pay | Admitting: Pulmonary Disease

## 2024-08-16 DIAGNOSIS — G894 Chronic pain syndrome: Secondary | ICD-10-CM | POA: Insufficient documentation

## 2024-08-16 DIAGNOSIS — M899 Disorder of bone, unspecified: Secondary | ICD-10-CM | POA: Insufficient documentation

## 2024-08-16 DIAGNOSIS — Z789 Other specified health status: Secondary | ICD-10-CM | POA: Insufficient documentation

## 2024-08-16 DIAGNOSIS — Z79899 Other long term (current) drug therapy: Secondary | ICD-10-CM | POA: Insufficient documentation

## 2024-08-16 NOTE — Patient Instructions (Incomplete)

## 2024-08-16 NOTE — Progress Notes (Unsigned)
 PROVIDER NOTE: Interpretation of information contained herein should be left to medically-trained personnel. Specific patient instructions are provided elsewhere under Patient Instructions section of medical record. This document was created in part using AI and STT-dictation technology, any transcriptional errors that may result from this process are unintentional.  Patient: Jasmine Larsen  Service: E/M Encounter  Provider: Eric DELENA Como, MD  DOB: 1966-02-19  Delivery: Face-to-face  Specialty: Interventional Pain Management  MRN: [id number]  Setting: Ambulatory outpatient facility  Specialty designation: 09  Type: New Patient  Location: Outpatient office facility  PCP: [name], MD  DOS: 08/17/2024    Referring Prov.: Bair, Kalpana, MD   Primary Reason(s) for Visit: Encounter for initial evaluation of one or more chronic problems (new to examiner) potentially causing chronic pain, and posing a threat to normal musculoskeletal function. (Level of risk: High) CC: No chief complaint on file.  HPI  [name] is a 58 y.o. year old, female patient, who comes for the first time to our practice referred by Bair, Kalpana, MD for our initial evaluation of her chronic pain. She has Shortness of breath; Morbid obesity (HCC); Pneumonia due to COVID-19 virus; Chronic diastolic CHF (congestive heart failure) (HCC); Hypokalemia; Hypocalcemia; Tobacco abuse; Hypophosphatemia; Uncontrolled type 2 diabetes mellitus with hyperglycemia (HCC); Acute midline low back pain with left-sided sciatica; Lower extremity edema; Anxiety and depression; Acute pain of left shoulder; COPD with acute exacerbation (HCC); Fall; Multiple lacerations; Left wrist pain; Burning with urination; Colon cancer screening; Need for hepatitis C screening test; Encounter for screening for HIV; Diabetic eye exam (HCC); Type 2 diabetes mellitus with complications (HCC); Hyperlipidemia associated with type 2 diabetes mellitus (HCC);  Congestive heart failure (HCC); Vitamin D  deficiency; Vitamin B 12 deficiency; Type 2 diabetes mellitus with diabetic neuropathy, without long-term current use of insulin  (HCC); Bilateral sciatica; Acute bronchitis with COPD (HCC); Calcaneal spur of both feet; Polyp of sigmoid colon; Polyp of rectum; Encounter for screening colonoscopy; Degeneration of lumbar intervertebral disc; Lumbar radiculopathy; Lymphedema; Hyponatremia; Left foot pain; Restless leg syndrome; Need for influenza vaccination; Encounter for immunization; IBS (irritable bowel syndrome); Mood disorder; Elevated blood pressure reading; Nausea; Snoring; Chronic bilateral low back pain with right-sided sciatica; Chronic pain syndrome; Pharmacologic therapy; Disorder of skeletal system; and Problems influencing health status on their problem list. Today she comes in for evaluation of her No chief complaint on file.  Pain Assessment: Location:     Radiating:   Onset:   Duration:   Quality:   Severity:  /10 (subjective, self-reported pain score)  Effect on ADL:   Timing:   Modifying factors:   BP:    HR:    Onset and Duration: {Hx; Onset and Duration:210120511} Cause of pain: {Hx; Cause:210120521} Severity: {Pain Severity:210120502} Timing: {Symptoms; Timing:210120501} Aggravating Factors: {Causes; Aggravating pain factors:210120507} Alleviating Factors: {Causes; Alleviating Factors:210120500} Associated Problems: {Hx; Associated problems:210120515} Quality of Pain: {Hx; Symptom quality or Descriptor:210120531} Previous Examinations or Tests: {Hx; Previous examinations or test:210120529} Previous Treatments: {Hx; Previous Treatment:210120503}  [name] is being evaluated for possible interventional pain management therapies for the treatment of her chronic pain.  Discussed the use of AI scribe software for clinical note transcription with the patient, who gave verbal consent to proceed.  History of Present Illness            [name] has been informed that this initial visit was an evaluation only.  On the follow up appointment I will go over the results, including ordered tests and available interventional therapies.  At that time she will have the opportunity to decide whether to proceed with offered therapies or not. In the event that [name] prefers avoiding interventional options, this will conclude our involvement in the case.  Medication management recommendations may be provided upon request.  Patient informed that diagnostic tests may be ordered to assist in identifying underlying causes, narrow the list of differential diagnoses and aid in determining candidacy for (or contraindications to) planned therapeutic interventions.  Historic Controlled Substance Pharmacotherapy Review PMP and historical list of controlled substances: Gabapentin  400 Mg Capsule   Most recently prescribed controlled substance(s): Opioid Analgesic: *** MME/day: *** mg/day  Historical Monitoring: The patient  reports no history of drug use. List of prior UDS Testing: No results found for: MDMA, COCAINSCRNUR, PCPSCRNUR, PCPQUANT, CANNABQUANT, THCU, ETH, CBDTHCR, D8THCCBX, D9THCCBX Historical Background Evaluation: [place] PMP: PDMP reviewed during this encounter. Review of the past 62-months conducted.             PMP NARX Score Report:  Narcotic: 000 Sedative: 000 Stimulant: 000 [place] Department of public safety, offender search: Engineer, Mining Information) Non-contributory Risk Assessment Profile: Aberrant behavior: None observed or detected today Risk factors for fatal opioid overdose: None identified today PMP NARX Overdose Risk Score: 260 Fatal overdose hazard ratio (HR): Calculation deferred Non-fatal overdose hazard ratio (HR): Calculation deferred Risk of opioid abuse or dependence: 0.7-3.0% with doses <= 36 MME/day and 6.1-26% with doses >= 120 MME/day. Substance use disorder (SUD) risk level: See  below Personal History of Substance Abuse (SUD-Substance use disorder):  Alcohol:    Illegal Drugs:    Rx Drugs:    ORT Risk Level calculation:    ORT Scoring interpretation table:  Score <3 = Low Risk for SUD  Score between 4-7 = Moderate Risk for SUD  Score >8 = High Risk for Opioid Abuse   PHQ-2 Depression Scale:  Total score:    PHQ-2 Scoring interpretation table: (Score and probability of major depressive disorder)  Score 0 = No depression  Score 1 = 15.4% Probability  Score 2 = 21.1% Probability  Score 3 = 38.4% Probability  Score 4 = 45.5% Probability  Score 5 = 56.4% Probability  Score 6 = 78.6% Probability   PHQ-9 Depression Scale:  Total score:    PHQ-9 Scoring interpretation table:  Score 0-4 = No depression  Score 5-9 = Mild depression  Score 10-14 = Moderate depression  Score 15-19 = Moderately severe depression  Score 20-27 = Severe depression (2.4 times higher risk of SUD and 2.89 times higher risk of overuse)   Pharmacologic Plan: As per protocol, I have not taken over any controlled substance management, pending the results of ordered tests and/or consults.            Initial impression: Pending review of available data and ordered tests.  Meds   Current Outpatient Medications:    Accu-Chek Softclix Lancets lancets, 1 each by Other route daily., Disp: 100 each, Rfl: 2   albuterol  (PROVENTIL ) (2.5 MG/3ML) 0.083% nebulizer solution, Take 3 mLs (2.5 mg total) by nebulization every 6 (six) hours as needed for wheezing or shortness of breath., Disp: 150 mL, Rfl: 1   albuterol  (VENTOLIN  HFA) 108 (90 Base) MCG/ACT inhaler, Inhale 1-2 puffs into the lungs every 6 (six) hours as needed for wheezing or shortness of breath., Disp: 18 g, Rfl: 3   benzonatate  (TESSALON ) 200 MG capsule, Take 1 capsule (200 mg total) by mouth 2 (two) times daily as needed for cough., Disp: 21  capsule, Rfl: 0   blood glucose meter kit and supplies KIT, Use up to 4 times daily., Disp: 1  each, Rfl: 0   Blood Glucose Monitoring Suppl DEVI, 1 each by Does not apply route as directed. May substitute to any manufacturer covered by patient's insurance., Disp: 1 each, Rfl: 0   cyanocobalamin  (VITAMIN B12) 1000 MCG tablet, Take 1 tablet (1,000 mcg total) by mouth daily., Disp: 90 tablet, Rfl: 3   dicyclomine  (BENTYL ) 20 MG tablet, Take 1 tablet (20 mg total) by mouth 3 (three) times daily as needed for spasms., Disp: 90 tablet, Rfl: 3   empagliflozin  (JARDIANCE ) 10 MG TABS tablet, Take 1 tablet (10 mg total) by mouth daily before breakfast., Disp: 90 tablet, Rfl: 3   Fluticasone -Umeclidin-Vilant (TRELEGY ELLIPTA ) 100-62.5-25 MCG/ACT AEPB, Inhale 1 puff into the lungs daily., Disp: 1 each, Rfl: 11   glucose blood (ACCU-CHEK GUIDE TEST) test strip, Use as instructed, Disp: 100 each, Rfl: 1   ipratropium-albuterol  (DUONEB) 0.5-2.5 (3) MG/3ML SOLN, Take 3 mLs by nebulization every 6 (six) hours as needed., Disp: 120 mL, Rfl: 2   metFORMIN  (GLUCOPHAGE ) 500 MG tablet, Take 1 tablet (500 mg total) by mouth 2 (two) times daily with a meal., Disp: 180 tablet, Rfl: 3   ondansetron  (ZOFRAN -ODT) 4 MG disintegrating tablet, Take 1 tablet (4 mg total) by mouth daily as needed for nausea or vomiting., Disp: 30 tablet, Rfl: 3   rOPINIRole  (REQUIP ) 1 MG tablet, Take 1 tablet (1 mg total) by mouth at bedtime., Disp: 90 tablet, Rfl: 2   rosuvastatin  (CRESTOR ) 20 MG tablet, Take 1 tablet (20 mg total) by mouth at bedtime., Disp: 90 tablet, Rfl: 3   Semaglutide , 2 MG/DOSE, (OZEMPIC , 2 MG/DOSE,) 8 MG/3ML SOPN, Inject 2 mg into the skin once a week., Disp: 3 mL, Rfl: 12   tiZANidine  (ZANAFLEX ) 4 MG tablet, Take 1 tablet (4 mg total) by mouth at bedtime as needed for muscle spasms., Disp: 90 tablet, Rfl: 3  Imaging Review  Lumbosacral Imaging: Lumbar MR wo contrast: Results for orders placed during the hospital encounter of 11/18/23 MR LUMBAR SPINE WO CONTRAST  Narrative CLINICAL DATA:  Chronic low back pain  with difficulty walking. No prior surgery.  EXAM: MRI LUMBAR SPINE WITHOUT CONTRAST  TECHNIQUE: Multiplanar, multisequence MR imaging of the lumbar spine was performed. No intravenous contrast was administered.  COMPARISON:  Lumbar spine x-rays dated December 15, 2022.  FINDINGS: Segmentation:  Standard.  Alignment:  Physiologic.  Vertebrae:  No fracture, evidence of discitis, or bone lesion.  Conus medullaris and cauda equina: Conus extends to the L1 level. Conus and cauda equina appear normal.  Paraspinal and other soft tissues: Negative.  Disc levels:  T12-L1:  Negative.  L1-L2:  Negative.  L2-L3:  Mild disc bulging.  No stenosis.  L3-L4:  Negative.  L4-L5: Mild right-greater-than-left foraminal disc bulging. Mild right neuroforaminal stenosis. No spinal canal or left neuroforaminal stenosis.  L5-S1:  Negative.  IMPRESSION: 1. Mild degenerative disc disease at L2-L3 and L4-L5 without high-grade stenosis or impingement.   Electronically Signed By: Elsie ONEIDA Shoulder M.D. On: 12/02/2023 16:20  Lumbar DG (Complete) 4+V: Results for orders placed during the hospital encounter of 12/15/22 DG Lumbar Spine Complete  Narrative CLINICAL DATA:  Lumbar pain with right-sided sciatica.  EXAM: LUMBAR SPINE - COMPLETE 4+ VIEW  COMPARISON:  None Available.  FINDINGS: There are 5 non-rib-bearing lumbar vertebra. Alignment is normal. Vertebral body heights are normal. There is mild diffuse disc space narrowing and  anterior spurring. No evidence of fracture or focal bone abnormalities. Soft tissue attenuation from habitus limits detailed assessment.  IMPRESSION: Mild multilevel degenerative disc disease.   Electronically Signed By: Andrea Gasman M.D. On: 12/15/2022 16:03  Foot Imaging: Foot-R DG Complete: Results for orders placed during the hospital encounter of 06/19/23 DG Foot Complete Right  Narrative CLINICAL DATA:  foot pain  EXAM: RIGHT FOOT  COMPLETE - 3+ VIEW  COMPARISON:  None Available.  FINDINGS: No acute fracture or dislocation.  No aggressive osseous lesion.  Mild diffuse degenerative changes of imaged joints noted.  Calcaneal spur noted along the Achilles tendon and Plantar aponeurosis attachment sites.  No focal soft tissue swelling.  No radiopaque foreign bodies.  IMPRESSION: 1. No acute osseous abnormalities.  Calcaneal spurring.   Electronically Signed By: Ree Molt M.D. On: 06/19/2023 11:56  Foot-L DG Complete: Results for orders placed in visit on 11/18/23 DG Foot Complete Left  Narrative CLINICAL DATA:  Left foot injury 2 weeks ago.  EXAM: LEFT FOOT - COMPLETE 3+ VIEW  COMPARISON:  None Available.  FINDINGS: There is no evidence of fracture or dislocation. Mild midfoot degenerative changes. Soft tissues are unremarkable.  IMPRESSION: 1. No acute osseous abnormality.   Electronically Signed By: Elsie ONEIDA Shoulder M.D. On: 12/02/2023 16:21  Wrist Imaging: Wrist-L DG Complete: Results for orders placed during the hospital encounter of 07/01/23 DG Wrist Complete Left  Narrative CLINICAL DATA:  Status post fall, tenderness to palpation  EXAM: LEFT WRIST - COMPLETE 3+ VIEW; LEFT FOREARM - 2 VIEW  COMPARISON:  None Available.  FINDINGS: Subtle linear lucency through the waist-distal pole of the scaphoid which may be projectional versus a nondisplaced fracture. No other fracture or dislocation. No aggressive osseous lesion. Normal alignment. Mild osteoarthritis of the scaphotrapeziotrapezoid joint. Mild osteoarthritis of the ulnohumeral joint.  Soft tissue swelling along the dorsal aspect of the distal forearm. No radiopaque foreign body or soft tissue emphysema.  IMPRESSION: 1. Subtle linear lucency through the waist-distal pole of the scaphoid which may be projectional versus a nondisplaced fracture. If there is further clinical concern, recommend a CT of the  left wrist.   Electronically Signed By: Julaine Blanch M.D. On: 07/01/2023 10:26  Hand Imaging: Hand-L DG Complete: Results for orders placed during the hospital encounter of 08/24/15 DG Hand Complete Left  Narrative CLINICAL DATA:  Pain following fall  EXAM: LEFT HAND - COMPLETE 3+ VIEW  COMPARISON:  None.  FINDINGS: Frontal, oblique, and lateral views were obtained. There is a fracture of the proximal portion of the fifth proximal phalanx with impaction at the fracture site. There is dorsal and medial angulation distally. No other fractures. No dislocation. Joint spaces appear intact. No erosive change.  IMPRESSION: Fracture of the proximal aspect of the fifth proximal phalanx. With impaction and dorsal/medial angulation distally. No other fractures. No dislocation. No appreciable arthropathic change.   Electronically Signed By: Elsie Repine III M.D. On: 08/24/2015 09:34  Complexity Note: Imaging results reviewed.                         ROS  Cardiovascular: {Hx; Cardiovascular History:210120525} Pulmonary or Respiratory: {Hx; Pumonary and/or Respiratory History:210120523} Neurological: {Hx; Neurological:210120504} Psychological-Psychiatric: {Hx; Psychological-Psychiatric History:210120512} Gastrointestinal: {Hx; Gastrointestinal:210120527} Genitourinary: {Hx; Genitourinary:210120506} Hematological: {Hx; Hematological:210120510} Endocrine: {Hx; Endocrine history:210120509} Rheumatologic: {Hx; Rheumatological:210120530} Musculoskeletal: {Hx; Musculoskeletal:210120528} Work History: {Hx; Work history:210120514}  Allergies  Ms. Muratalla is allergic to cymbalta  [duloxetine  hcl].  Laboratory Chemistry Profile  Renal Lab Results  Component Value Date   BUN 12 06/29/2024   CREATININE 0.65 06/29/2024   GFR 98.40 06/29/2024   GFRAA >60 07/14/2016   GFRNONAA >60 11/11/2023   SPECGRAV 1.020 08/06/2023   PHUR 8.5 (A) 08/06/2023   PROTEINUR Negative  08/06/2023     Electrolytes Lab Results  Component Value Date   NA 134 (L) 06/29/2024   K 4.4 06/29/2024   CL 99 06/29/2024   CALCIUM  9.1 06/29/2024   MG 2.2 07/22/2020   PHOS 2.8 07/22/2020     Hepatic Lab Results  Component Value Date   AST 11 06/29/2024   ALT 13 06/29/2024   ALBUMIN 4.2 06/29/2024   ALKPHOS 102 06/29/2024   LIPASE 23 07/19/2020     ID Lab Results  Component Value Date   HIV Non Reactive 04/28/2023   SARSCOV2NAA NEGATIVE 11/11/2023     Bone Lab Results  Component Value Date   VD25OH <7.00 (L) 08/06/2023     Endocrine Lab Results  Component Value Date   GLUCOSE 120 (H) 06/29/2024   GLUCOSEU >=500 (A) 07/19/2020   HGBA1C 7.6 (H) 06/29/2024   TSH 2.58 08/06/2023     Neuropathy Lab Results  Component Value Date   VITAMINB12 302 08/06/2023   HGBA1C 7.6 (H) 06/29/2024   HIV Non Reactive 04/28/2023     CNS No results found for: COLORCSF, APPEARCSF, RBCCOUNTCSF, WBCCSF, POLYSCSF, LYMPHSCSF, EOSCSF, PROTEINCSF, GLUCCSF, JCVIRUS, CSFOLI, IGGCSF, LABACHR, ACETBL   Inflammation (CRP: Acute  ESR: Chronic) Lab Results  Component Value Date   CRP 2.4 (H) 07/22/2020   LATICACIDVEN 1.5 04/27/2023     Rheumatology No results found for: RF, ANA, LABURIC, URICUR, LYMEIGGIGMAB, LYMEABIGMQN, HLAB27   Coagulation Lab Results  Component Value Date   PLT 213 11/11/2023     Cardiovascular Lab Results  Component Value Date   BNP 9 08/06/2023   TROPONINI <0.03 07/14/2016   HGB 14.5 11/11/2023   HCT 42.8 11/11/2023     Screening Lab Results  Component Value Date   SARSCOV2NAA NEGATIVE 11/11/2023   HIV Non Reactive 04/28/2023     Cancer No results found for: CEA, CA125, LABCA2   Allergens No results found for: ALMOND, APPLE, ASPARAGUS, AVOCADO, BANANA, BARLEY, BASIL, BAYLEAF, GREENBEAN, LIMABEAN, WHITEBEAN, BEEFIGE, REDBEET, BLUEBERRY, BROCCOLI, CABBAGE, MELON,  CARROT, CASEIN, CASHEWNUT, CAULIFLOWER, CELERY     Note: Lab results reviewed.  PFSH  Drug: Ms. Shadle  reports no history of drug use. Alcohol:  reports no history of alcohol use. Tobacco:  reports that she has been smoking cigarettes. She started smoking about 42 years ago. She has a 128.5 pack-year smoking history. She has been exposed to tobacco smoke. She has never used smokeless tobacco. Medical:  has a past medical history of (HFpEF) heart failure with preserved ejection fraction (HCC), Acute respiratory failure with hypoxia (HCC), Anxiety, Asthma, Chest pain, COPD (chronic obstructive pulmonary disease) (HCC), DDD (degenerative disc disease), lumbar, Depression, Hepatic steatosis, History of 2019 novel coronavirus disease (COVID-19) (07/19/2020), HLD (hyperlipidemia), HTN (hypertension), Lumbosacral spinal stenosis, Miscarriage, Morbid obesity (HCC), Multiple gastric ulcers, Osteoarthritis, Peripheral edema, Pneumonia due to COVID-19 virus, Sepsis due to pneumonia (HCC), Sigmoid polyp, T2DM (type 2 diabetes mellitus) (HCC), Vitamin B12 deficiency, and Vitamin D  deficiency. Family: family history includes Alcohol abuse in her maternal grandmother; Asthma in her mother; COPD in her brother and mother; Congestive Heart Failure in her brother; Diabetes in her brother and mother; Early death in her brother; Heart Problems in her mother; Lung cancer  in her maternal grandmother; Tuberculosis in her maternal grandmother.  Past Surgical History:  Procedure Laterality Date   CESAREAN SECTION     x3   COLONOSCOPY WITH PROPOFOL  N/A 09/21/2023   Procedure: COLONOSCOPY WITH PROPOFOL ;  Surgeon: Unk Corinn Skiff, MD;  Location: [facility] ENDOSCOPY;  Service: Gastroenterology;  Laterality: N/A;   MASS EXCISION Right 10/28/2023   Procedure: INCISION AND REMOVAL OF RIGHT FOREARM MASS;  Surgeon: Edie Norleen PARAS, MD;  Location: [facility] ORS;  Service: Orthopedics;  Laterality: Right;   POLYPECTOMY   09/21/2023   Procedure: POLYPECTOMY;  Surgeon: Unk Corinn Skiff, MD;  Location[facility] ENDOSCOPY;  Service: Gastroenterology;;   TUBAL LIGATION  April 21 1991   Active Ambulatory Problems    Diagnosis Date Noted   Shortness of breath 07/13/2016   Morbid obesity (HCC) 07/13/2016   Pneumonia due to COVID-19 virus 07/19/2020   Chronic diastolic CHF (congestive heart failure) (HCC) 07/19/2020   Hypokalemia 07/19/2020   Hypocalcemia 07/19/2020   Tobacco abuse 07/19/2020   Hypophosphatemia 07/19/2020   Uncontrolled type 2 diabetes mellitus with hyperglycemia (HCC) 12/15/2022   Acute midline low back pain with left-sided sciatica 12/15/2022   Lower extremity edema 12/15/2022   Anxiety and depression 12/15/2022   Acute pain of left shoulder 01/26/2023   COPD with acute exacerbation (HCC) 04/27/2023   Fall 07/01/2023   Multiple lacerations 07/01/2023   Left wrist pain 07/06/2023   Burning with urination 08/09/2023   Colon cancer screening 08/09/2023   Need for hepatitis C screening test 08/09/2023   Encounter for screening for HIV 08/09/2023   Diabetic eye exam (HCC) 08/09/2023   Type 2 diabetes mellitus with complications (HCC) 08/09/2023   Hyperlipidemia associated with type 2 diabetes mellitus (HCC) 08/09/2023   Congestive heart failure (HCC) 08/09/2023   Vitamin D  deficiency 08/09/2023   Vitamin B 12 deficiency 08/09/2023   Type 2 diabetes mellitus with diabetic neuropathy, without long-term current use of insulin  (HCC) 08/09/2023   Bilateral sciatica 08/09/2023   Acute bronchitis with COPD (HCC) 08/09/2023   Calcaneal spur of both feet 08/09/2023   Polyp of sigmoid colon 09/21/2023   Polyp of rectum 09/21/2023   Encounter for screening colonoscopy 09/21/2023   Degeneration of lumbar intervertebral disc 03/18/2023   Lumbar radiculopathy 03/18/2023   Lymphedema 11/17/2023   Hyponatremia 11/19/2023   Left foot pain 11/19/2023   Restless leg syndrome 12/20/2023   Need for  influenza vaccination 12/20/2023   Encounter for immunization 12/20/2023   IBS (irritable bowel syndrome) 02/19/2024   Mood disorder 02/19/2024   Elevated blood pressure reading 02/19/2024   Nausea 03/15/2024   Snoring 06/29/2024   Chronic bilateral low back pain with right-sided sciatica 06/29/2024   Chronic pain syndrome 08/16/2024   Pharmacologic therapy 08/16/2024   Disorder of skeletal system 08/16/2024   Problems influencing health status 08/16/2024   Resolved Ambulatory Problems    Diagnosis Date Noted   Chest pain 07/13/2016   COPD with asthma (HCC) 07/13/2016   Acute respiratory failure with hypoxia (HCC) 07/19/2020   Nausea, vomiting and diarrhea 07/19/2020   Soft tissue mass 12/15/2022   Mass of right forearm 03/31/2023   Sepsis due to pneumonia (HCC) 04/27/2023   Abrasion of left forearm 07/01/2023   Visit for suture removal 07/06/2023   Abnormal urinalysis 08/09/2023   Breast cancer screening by mammogram 08/09/2023   COPD exacerbation (HCC) 08/09/2023   UTI (urinary tract infection) 08/16/2023   Past Medical History:  Diagnosis Date   (HFpEF) heart failure with  preserved ejection fraction (HCC)    Anxiety    Asthma    COPD (chronic obstructive pulmonary disease) (HCC)    DDD (degenerative disc disease), lumbar    Depression    Hepatic steatosis    History of 2019 novel coronavirus disease (COVID-19) 07/19/2020   HLD (hyperlipidemia)    HTN (hypertension)    Lumbosacral spinal stenosis    Miscarriage    Multiple gastric ulcers    Osteoarthritis    Peripheral edema    Sigmoid polyp    T2DM (type 2 diabetes mellitus) (HCC)    Vitamin B12 deficiency    Constitutional Exam  General appearance: Well nourished, well developed, and well hydrated. In no apparent acute distress There were no vitals filed for this visit. BMI Assessment: Estimated body mass index is 60.49 kg/m as calculated from the following:   Height as of 07/28/24: 5' 4 (1.626 m).    Weight as of 07/28/24: 352 lb 6.4 oz (159.8 kg).  BMI interpretation table: BMI level Category Range association with higher incidence of chronic pain  <18 kg/m2 Underweight   18.5-24.9 kg/m2 Ideal body weight   25-29.9 kg/m2 Overweight Increased incidence by 20%  30-34.9 kg/m2 Obese (Class I) Increased incidence by 68%  35-39.9 kg/m2 Severe obesity (Class II) Increased incidence by 136%  >40 kg/m2 Extreme obesity (Class III) Increased incidence by 254%   Patient's current BMI Ideal Body weight  There is no height or weight on file to calculate BMI. Patient weight not recorded   BMI Readings from Last 4 Encounters:  07/28/24 60.49 kg/m  07/07/24 59.39 kg/m  06/29/24 59.49 kg/m  05/17/24 58.19 kg/m   Wt Readings from Last 4 Encounters:  07/28/24 (!) 352 lb 6.4 oz (159.8 kg)  07/07/24 (!) 346 lb (156.9 kg)  06/29/24 (!) 346 lb 9.6 oz (157.2 kg)  05/17/24 (!) 339 lb (153.8 kg)    Psych/Mental status: Alert, oriented x 3 (person, place, & time)       Eyes: PERLA Respiratory: No evidence of acute respiratory distress  Assessment  Primary Diagnosis & Pertinent Problem List: The primary encounter diagnosis was Chronic pain syndrome. Diagnoses of Pharmacologic therapy, Disorder of skeletal system, and Problems influencing health status were also pertinent to this visit.  Visit Diagnosis (New problems to examiner): 1. Chronic pain syndrome   2. Pharmacologic therapy   3. Disorder of skeletal system   4. Problems influencing health status    Plan of Care (Initial workup plan)  Note: Ms. Coletti was reminded that as per protocol, today's visit has been an evaluation only. We have not taken over the patient's controlled substance management.  Problem-specific plan: Assessment and Plan            Lab Orders  No laboratory test(s) ordered today   Imaging Orders  No imaging studies ordered today   Referral Orders  No referral(s) requested today   Procedure Orders     No procedure(s) ordered today   Pharmacotherapy (current): Medications ordered:  No orders of the defined types were placed in this encounter.  Medications administered during this visit: Stefanie L. Woodroof Diane had no medications administered during this visit.   Analgesic Pharmacotherapy:  Opioid Analgesics: For patients currently taking or requesting to take opioid analgesics, in accordance with [place]  Medical Board Guidelines, we will assess their risks and indications for the use of these substances. After completing our evaluation, we may offer recommendations, but we no longer take patients for medication management. The  prescribing physician will ultimately decide, based on his/her training and level of comfort whether to adopt any of the recommendations, including whether or not to prescribe such medicines.  Membrane stabilizer: To be determined at a later time  Muscle relaxant: To be determined at a later time  NSAID: To be determined at a later time  Other analgesic(s): To be determined at a later time   Interventional management options: Ms. Abair was informed that there is no guarantee that she would be a candidate for interventional therapies. The decision will be based on the results of diagnostic studies, as well as Ms. Freeberg's risk profile.  Procedure(s) under consideration:  Pending results of ordered studies     Interventional Therapies  Risk Factors  Considerations  Medical Comorbidities:     Planned  Pending:      Under consideration:   Pending   Completed: (Analgesic benefit)1  None at this time   Therapeutic  Palliative (PRN) options:   None established   Completed by other providers:   None reported  1(Analgesic benefit): Expressed in percentage (%). (Local anesthetic[LA] +/- sedation  L.A.Local Anesthetic  Steroid benefit  Ongoing benefit)   Provider-requested follow-up: No follow-ups on file.  Future Appointments  Date Time  Provider Department Center  08/17/2024  9:00 AM Tanya Glisson, MD [facility] None  09/06/2024  4:30 PM [name], MD [facility] [address]  09/22/2024  9:45 AM Gaynel Delon CROME, DPM [facility]  11/16/2024 10:30 AM Delores Orvin BRAVO, NP [facility] None  02/24/2025 11:30 AM [facility] ANNUAL WELLNESS VISIT [facility] [address]   I discussed the assessment and treatment plan with the patient. The patient was provided an opportunity to ask questions and all were answered. The patient agreed with the plan and demonstrated an understanding of the instructions.  Patient advised to call back or seek an in-person evaluation if the symptoms or condition worsens.  Duration of encounter: *** minutes.  Total time on encounter, as per AMA guidelines included both the face-to-face and non-face-to-face time personally spent by the physician and/or other qualified health care professional(s) on the day of the encounter (includes time in activities that require the physician or other qualified health care professional and does not include time in activities normally performed by clinical staff). Physician's time may include the following activities when performed: Preparing to see the patient (e.g., pre-charting review of records, searching for previously ordered imaging, lab work, and nerve conduction tests) Review of prior analgesic pharmacotherapies. Reviewing PMP Interpreting ordered tests (e.g., lab work, imaging, nerve conduction tests) Performing post-procedure evaluations, including interpretation of diagnostic procedures Obtaining and/or reviewing separately obtained history Performing a medically appropriate examination and/or evaluation Counseling and educating the patient/family/caregiver Ordering medications, tests, or procedures Referring and communicating with other health care professionals (when not separately reported) Documenting clinical information in the electronic or other  health record Independently interpreting results (not separately reported) and communicating results to the patient/ family/caregiver Care coordination (not separately reported)  Note by: Glisson DELENA Tanya, MD (TTS and AI technology used. I apologize for any typographical errors that were not detected and corrected.) Date: 08/17/2024; Time: 9:31 PM

## 2024-08-17 ENCOUNTER — Encounter: Payer: Self-pay | Admitting: Pain Medicine

## 2024-08-17 ENCOUNTER — Ambulatory Visit: Attending: Pain Medicine | Admitting: Pain Medicine

## 2024-08-17 VITALS — BP 153/78 | HR 80 | Temp 97.3°F | Resp 16 | Ht 62.0 in | Wt 353.0 lb

## 2024-08-17 DIAGNOSIS — Z79899 Other long term (current) drug therapy: Secondary | ICD-10-CM | POA: Diagnosis present

## 2024-08-17 DIAGNOSIS — M79605 Pain in left leg: Secondary | ICD-10-CM | POA: Insufficient documentation

## 2024-08-17 DIAGNOSIS — M150 Primary generalized (osteo)arthritis: Secondary | ICD-10-CM | POA: Insufficient documentation

## 2024-08-17 DIAGNOSIS — M25561 Pain in right knee: Secondary | ICD-10-CM | POA: Diagnosis not present

## 2024-08-17 DIAGNOSIS — G894 Chronic pain syndrome: Secondary | ICD-10-CM | POA: Diagnosis present

## 2024-08-17 DIAGNOSIS — E66813 Obesity, class 3: Secondary | ICD-10-CM | POA: Insufficient documentation

## 2024-08-17 DIAGNOSIS — M545 Low back pain, unspecified: Secondary | ICD-10-CM | POA: Insufficient documentation

## 2024-08-17 DIAGNOSIS — G8929 Other chronic pain: Secondary | ICD-10-CM | POA: Diagnosis present

## 2024-08-17 DIAGNOSIS — Z789 Other specified health status: Secondary | ICD-10-CM | POA: Diagnosis present

## 2024-08-17 DIAGNOSIS — M25562 Pain in left knee: Secondary | ICD-10-CM | POA: Diagnosis present

## 2024-08-17 DIAGNOSIS — M79604 Pain in right leg: Secondary | ICD-10-CM | POA: Diagnosis not present

## 2024-08-17 DIAGNOSIS — M899 Disorder of bone, unspecified: Secondary | ICD-10-CM | POA: Diagnosis present

## 2024-08-17 DIAGNOSIS — Z6841 Body Mass Index (BMI) 40.0 and over, adult: Secondary | ICD-10-CM | POA: Diagnosis present

## 2024-08-17 NOTE — Progress Notes (Signed)
 Safety precautions to be maintained throughout the outpatient stay will include: orient to surroundings, keep bed in low position, maintain call bell within reach at all times, provide assistance with transfer out of bed and ambulation.

## 2024-08-18 ENCOUNTER — Encounter (INDEPENDENT_AMBULATORY_CARE_PROVIDER_SITE_OTHER): Payer: Self-pay

## 2024-08-20 LAB — COMPLIANCE DRUG ANALYSIS, UR

## 2024-08-26 ENCOUNTER — Ambulatory Visit: Attending: Sleep Medicine

## 2024-08-26 DIAGNOSIS — Z6841 Body Mass Index (BMI) 40.0 and over, adult: Secondary | ICD-10-CM | POA: Insufficient documentation

## 2024-08-26 DIAGNOSIS — J449 Chronic obstructive pulmonary disease, unspecified: Secondary | ICD-10-CM | POA: Diagnosis not present

## 2024-08-26 DIAGNOSIS — E119 Type 2 diabetes mellitus without complications: Secondary | ICD-10-CM | POA: Diagnosis not present

## 2024-08-26 DIAGNOSIS — G4733 Obstructive sleep apnea (adult) (pediatric): Secondary | ICD-10-CM | POA: Insufficient documentation

## 2024-08-26 DIAGNOSIS — Z7984 Long term (current) use of oral hypoglycemic drugs: Secondary | ICD-10-CM | POA: Diagnosis not present

## 2024-08-26 DIAGNOSIS — Z7985 Long-term (current) use of injectable non-insulin antidiabetic drugs: Secondary | ICD-10-CM | POA: Insufficient documentation

## 2024-08-26 LAB — MAGNESIUM: Magnesium: 2.1 mg/dL (ref 1.6–2.3)

## 2024-08-26 LAB — SEDIMENTATION RATE: Sed Rate: 46 mm/h — ABNORMAL HIGH (ref 0–40)

## 2024-08-26 LAB — C-REACTIVE PROTEIN: CRP: 16 mg/L — ABNORMAL HIGH (ref 0–10)

## 2024-08-26 LAB — 25-HYDROXY VITAMIN D LCMS D2+D3
25-Hydroxy, Vitamin D-2: 8.9 ng/mL
25-Hydroxy, Vitamin D-3: 4.8 ng/mL
25-Hydroxy, Vitamin D: 14 ng/mL — ABNORMAL LOW

## 2024-08-26 LAB — VITAMIN B12: Vitamin B-12: 430 pg/mL (ref 232–1245)

## 2024-09-01 ENCOUNTER — Telehealth: Payer: Self-pay

## 2024-09-01 DIAGNOSIS — E118 Type 2 diabetes mellitus with unspecified complications: Secondary | ICD-10-CM

## 2024-09-01 DIAGNOSIS — E1169 Type 2 diabetes mellitus with other specified complication: Secondary | ICD-10-CM

## 2024-09-01 NOTE — Telephone Encounter (Signed)
 1. 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 Hyperlipidemia associated with type 2 diabetes mellitus (HCC) - Lipid panel; Future - Comp Met (CMET); Future  Luke Shade, MD

## 2024-09-02 DIAGNOSIS — R0683 Snoring: Secondary | ICD-10-CM | POA: Diagnosis not present

## 2024-09-05 ENCOUNTER — Ambulatory Visit
Admission: RE | Admit: 2024-09-05 | Discharge: 2024-09-05 | Disposition: A | Discharge status: Home / Self Care | Source: Ambulatory Visit | Attending: Pain Medicine | Admitting: Pain Medicine

## 2024-09-05 ENCOUNTER — Ambulatory Visit
Admission: RE | Admit: 2024-09-05 | Discharge: 2024-09-05 | Disposition: A | Discharge status: Home / Self Care | Attending: Pain Medicine | Admitting: Pain Medicine

## 2024-09-05 DIAGNOSIS — M150 Primary generalized (osteo)arthritis: Secondary | ICD-10-CM | POA: Insufficient documentation

## 2024-09-05 DIAGNOSIS — M79604 Pain in right leg: Secondary | ICD-10-CM | POA: Diagnosis present

## 2024-09-05 DIAGNOSIS — M79605 Pain in left leg: Secondary | ICD-10-CM | POA: Insufficient documentation

## 2024-09-05 DIAGNOSIS — G8929 Other chronic pain: Secondary | ICD-10-CM | POA: Insufficient documentation

## 2024-09-05 DIAGNOSIS — M25562 Pain in left knee: Secondary | ICD-10-CM | POA: Diagnosis present

## 2024-09-05 DIAGNOSIS — M25561 Pain in right knee: Secondary | ICD-10-CM | POA: Insufficient documentation

## 2024-09-05 DIAGNOSIS — M545 Low back pain, unspecified: Secondary | ICD-10-CM | POA: Insufficient documentation

## 2024-09-06 ENCOUNTER — Ambulatory Visit

## 2024-09-06 ENCOUNTER — Telehealth: Payer: Self-pay

## 2024-09-06 NOTE — Telephone Encounter (Signed)
 1. Morbid obesity (HCC) (Primary) - AMB Referral [facility]BCI Care Management/referral to clinical social worker.   Luke Shade, MD

## 2024-09-12 ENCOUNTER — Ambulatory Visit

## 2024-09-20 ENCOUNTER — Other Ambulatory Visit: Payer: Self-pay

## 2024-09-20 DIAGNOSIS — G2581 Restless legs syndrome: Secondary | ICD-10-CM

## 2024-09-20 NOTE — Telephone Encounter (Signed)
 Received fax from patient's local [facility] pharmacy stating Insurance no longer covers this medication. Please advise if you want to try a new? Please advise?

## 2024-09-20 NOTE — Telephone Encounter (Signed)
 Please call the patient and update her on insurance coverage issue for Ropinirole  coverage and update her on the fax we received from [place]. Alternatives can be Gabapentin , Pregabalin. I recommend patient calls her insurance to see which alternatives is covered and I will send a refill.   Thank you,  Luke Shade, MD

## 2024-09-21 ENCOUNTER — Telehealth: Payer: Self-pay

## 2024-09-21 NOTE — Telephone Encounter (Signed)
 Reached out to patient and spoke with daughter Leonette (verified via DPR) to inform her of Dr Graylon recommendations. Leonette states the patient has tried Gabapentin  in the past and will NOT take it anymore, as she felt it wasn't helpful to her. Myles Leonette to reach out to patient's insurance to see if they would cover the Pregablin (Lyrica) and to let our office know, so Dr Abbey can send a refill. Verbalized understanding.

## 2024-09-21 NOTE — Progress Notes (Signed)
 Complex Care Management Note  Care Guide Note 09/21/2024 Name: Jasmine Larsen MRN: [id number] DOB: 01-28-1966  Jasmine Larsen is a 58 y.o. year old female who sees Bair, Kalpana, MD for primary care. I reached out to Jasmine Larsen by phone today to offer complex care management services.  Jasmine Larsen was given information about Complex Care Management services today including:   The Complex Care Management services include support from the care team which includes your Nurse Care Manager, Clinical Social Worker, or Pharmacist.  The Complex Care Management team is here to help remove barriers to the health concerns and goals most important to you. Complex Care Management services are voluntary, and the patient may decline or stop services at any time by request to their care team member.   Complex Care Management Consent Status: Patient agreed to services and verbal consent obtained.   Follow up plan:  Telephone appointment with complex care management team member scheduled for:  10/18/24 @ 11 AM  Encounter Outcome:  Patient Scheduled  Leotis Rase [facility], [facility] Guide  Direct Dial: [phone]  Fax [phone]

## 2024-09-21 NOTE — Telephone Encounter (Signed)
 Noted.   Thank you,  Luke Shade, MD

## 2024-09-22 ENCOUNTER — Encounter: Payer: Self-pay | Admitting: Family Medicine

## 2024-09-22 ENCOUNTER — Ambulatory Visit (INDEPENDENT_AMBULATORY_CARE_PROVIDER_SITE_OTHER): Admitting: Family Medicine

## 2024-09-22 ENCOUNTER — Inpatient Hospital Stay
Admission: EM | Admit: 2024-09-22 | Discharge: 2024-09-26 | DRG: 690 | Disposition: A | Attending: Student | Admitting: Student

## 2024-09-22 ENCOUNTER — Emergency Department

## 2024-09-22 ENCOUNTER — Ambulatory Visit: Admitting: Podiatry

## 2024-09-22 ENCOUNTER — Encounter: Payer: Self-pay | Admitting: Intensive Care

## 2024-09-22 ENCOUNTER — Other Ambulatory Visit: Payer: Self-pay

## 2024-09-22 VITALS — BP 118/74 | HR 87 | Temp 98.4°F | Resp 16 | Ht 62.0 in | Wt 347.1 lb

## 2024-09-22 DIAGNOSIS — F17210 Nicotine dependence, cigarettes, uncomplicated: Secondary | ICD-10-CM | POA: Diagnosis present

## 2024-09-22 DIAGNOSIS — N10 Acute tubulo-interstitial nephritis: Principal | ICD-10-CM

## 2024-09-22 DIAGNOSIS — I5032 Chronic diastolic (congestive) heart failure: Secondary | ICD-10-CM | POA: Diagnosis present

## 2024-09-22 DIAGNOSIS — E860 Dehydration: Secondary | ICD-10-CM | POA: Diagnosis not present

## 2024-09-22 DIAGNOSIS — Z860100 Personal history of colon polyps, unspecified: Secondary | ICD-10-CM | POA: Diagnosis not present

## 2024-09-22 DIAGNOSIS — Z91198 Patient's noncompliance with other medical treatment and regimen for other reason: Secondary | ICD-10-CM

## 2024-09-22 DIAGNOSIS — E118 Type 2 diabetes mellitus with unspecified complications: Secondary | ICD-10-CM

## 2024-09-22 DIAGNOSIS — N12 Tubulo-interstitial nephritis, not specified as acute or chronic: Secondary | ICD-10-CM | POA: Diagnosis present

## 2024-09-22 DIAGNOSIS — Z6841 Body Mass Index (BMI) 40.0 and over, adult: Secondary | ICD-10-CM | POA: Diagnosis not present

## 2024-09-22 DIAGNOSIS — E876 Hypokalemia: Secondary | ICD-10-CM | POA: Diagnosis present

## 2024-09-22 DIAGNOSIS — Z79899 Other long term (current) drug therapy: Secondary | ICD-10-CM | POA: Diagnosis not present

## 2024-09-22 DIAGNOSIS — Z8616 Personal history of COVID-19: Secondary | ICD-10-CM | POA: Diagnosis not present

## 2024-09-22 DIAGNOSIS — E871 Hypo-osmolality and hyponatremia: Secondary | ICD-10-CM | POA: Diagnosis present

## 2024-09-22 DIAGNOSIS — J4489 Other specified chronic obstructive pulmonary disease: Secondary | ICD-10-CM | POA: Diagnosis present

## 2024-09-22 DIAGNOSIS — R112 Nausea with vomiting, unspecified: Secondary | ICD-10-CM

## 2024-09-22 DIAGNOSIS — Z9851 Tubal ligation status: Secondary | ICD-10-CM | POA: Diagnosis not present

## 2024-09-22 DIAGNOSIS — E1165 Type 2 diabetes mellitus with hyperglycemia: Secondary | ICD-10-CM | POA: Diagnosis not present

## 2024-09-22 DIAGNOSIS — R338 Other retention of urine: Secondary | ICD-10-CM

## 2024-09-22 DIAGNOSIS — Z7985 Long-term (current) use of injectable non-insulin antidiabetic drugs: Secondary | ICD-10-CM | POA: Diagnosis not present

## 2024-09-22 DIAGNOSIS — R300 Dysuria: Secondary | ICD-10-CM | POA: Diagnosis not present

## 2024-09-22 DIAGNOSIS — E559 Vitamin D deficiency, unspecified: Secondary | ICD-10-CM | POA: Diagnosis present

## 2024-09-22 DIAGNOSIS — Z7984 Long term (current) use of oral hypoglycemic drugs: Secondary | ICD-10-CM | POA: Diagnosis not present

## 2024-09-22 DIAGNOSIS — E861 Hypovolemia: Secondary | ICD-10-CM | POA: Diagnosis present

## 2024-09-22 DIAGNOSIS — E785 Hyperlipidemia, unspecified: Secondary | ICD-10-CM | POA: Diagnosis present

## 2024-09-22 DIAGNOSIS — Z8249 Family history of ischemic heart disease and other diseases of the circulatory system: Secondary | ICD-10-CM | POA: Diagnosis not present

## 2024-09-22 DIAGNOSIS — I110 Hypertensive heart disease with heart failure: Secondary | ICD-10-CM | POA: Diagnosis present

## 2024-09-22 DIAGNOSIS — E66813 Obesity, class 3: Secondary | ICD-10-CM | POA: Diagnosis present

## 2024-09-22 DIAGNOSIS — Z833 Family history of diabetes mellitus: Secondary | ICD-10-CM | POA: Diagnosis not present

## 2024-09-22 DIAGNOSIS — E611 Iron deficiency: Secondary | ICD-10-CM | POA: Diagnosis present

## 2024-09-22 LAB — COMPREHENSIVE METABOLIC PANEL WITH GFR
ALT: 25 U/L (ref 0–44)
AST: 25 U/L (ref 15–41)
Albumin: 3.6 g/dL (ref 3.5–5.0)
Alkaline Phosphatase: 127 U/L — ABNORMAL HIGH (ref 38–126)
Anion gap: 16 — ABNORMAL HIGH (ref 5–15)
BUN: 12 mg/dL (ref 6–20)
CO2: 23 mmol/L (ref 22–32)
Calcium: 8.8 mg/dL — ABNORMAL LOW (ref 8.9–10.3)
Chloride: 88 mmol/L — ABNORMAL LOW (ref 98–111)
Creatinine, Ser: 1.04 mg/dL — ABNORMAL HIGH (ref 0.44–1.00)
GFR, Estimated: >60 mL/min (ref >60)
Glucose, Bld: 336 mg/dL — ABNORMAL HIGH (ref 70–99)
Potassium: 3.7 mmol/L (ref 3.5–5.1)
Sodium: 127 mmol/L — ABNORMAL LOW (ref 135–145)
Total Bilirubin: 1 mg/dL (ref 0.0–1.2)
Total Protein: 7.7 g/dL (ref 6.5–8.1)

## 2024-09-22 LAB — URINALYSIS, ROUTINE W REFLEX MICROSCOPIC
Bilirubin Urine: NEGATIVE
Glucose, UA: >=500 mg/dL — AB
Ketones, ur: NEGATIVE mg/dL
Nitrite: NEGATIVE
Protein, ur: 30 mg/dL — AB
Specific Gravity, Urine: >1.046 — ABNORMAL HIGH (ref 1.005–1.030)
WBC, UA: >50 WBC/hpf (ref 0–5)
pH: 6 (ref 5.0–8.0)

## 2024-09-22 LAB — CBC WITH DIFFERENTIAL/PLATELET
Abs Immature Granulocytes: 0.06 K/uL (ref 0.00–0.07)
Basophils Absolute: 0 K/uL (ref 0.0–0.1)
Basophils Relative: 0 %
Eosinophils Absolute: 0.1 K/uL (ref 0.0–0.5)
Eosinophils Relative: 1 %
HCT: 43.3 % (ref 36.0–46.0)
Hemoglobin: 14.2 g/dL (ref 12.0–15.0)
Immature Granulocytes: 1 %
Lymphocytes Relative: 11 %
Lymphs Abs: 1.2 K/uL (ref 0.7–4.0)
MCH: 27.6 pg (ref 26.0–34.0)
MCHC: 32.8 g/dL (ref 30.0–36.0)
MCV: 84.1 fL (ref 80.0–100.0)
Monocytes Absolute: 0.9 K/uL (ref 0.1–1.0)
Monocytes Relative: 8 %
Neutro Abs: 8.8 K/uL — ABNORMAL HIGH (ref 1.7–7.7)
Neutrophils Relative %: 79 %
Platelets: 174 K/uL (ref 150–400)
RBC: 5.15 MIL/uL — ABNORMAL HIGH (ref 3.87–5.11)
RDW: 13.5 % (ref 11.5–15.5)
WBC: 11.1 K/uL — ABNORMAL HIGH (ref 4.0–10.5)
nRBC: 0 % (ref 0.0–0.2)

## 2024-09-22 LAB — LACTIC ACID, PLASMA: Lactic Acid, Venous: 1.5 mmol/L (ref 0.5–1.9)

## 2024-09-22 MED ORDER — SODIUM CHLORIDE 0.9 % IV BOLUS
1000.0000 mL | Freq: Once | INTRAVENOUS | Status: AC
  Administered 2024-09-22: 1000 mL via INTRAVENOUS

## 2024-09-22 MED ORDER — ONDANSETRON HCL 4 MG PO TABS: 4.0000 mg | ORAL_TABLET | Freq: Four times a day (QID) | ORAL | Status: DC | PRN

## 2024-09-22 MED ORDER — TRAZODONE HCL 50 MG PO TABS: 25.0000 mg | ORAL_TABLET | Freq: Every evening | ORAL | Status: DC | PRN

## 2024-09-22 MED ORDER — ACETAMINOPHEN 325 MG PO TABS: 650.0000 mg | ORAL_TABLET | Freq: Four times a day (QID) | ORAL | Status: DC | PRN

## 2024-09-22 MED ORDER — SODIUM CHLORIDE 0.9 % IV SOLN: INTRAVENOUS | Status: AC

## 2024-09-22 MED ORDER — IOHEXOL 350 MG/ML SOLN
100.0000 mL | Freq: Once | INTRAVENOUS | Status: AC | PRN
  Administered 2024-09-22: 100 mL via INTRAVENOUS

## 2024-09-22 MED ORDER — MORPHINE SULFATE (PF) 2 MG/ML IV SOLN
2.0000 mg | INTRAVENOUS | Status: DC | PRN
  Administered 2024-09-22 – 2024-09-26 (×14): 2 mg via INTRAVENOUS
  Filled 2024-09-22 (×14): qty 1

## 2024-09-22 MED ORDER — PHENAZOPYRIDINE HCL 100 MG PO TABS
100.0000 mg | ORAL_TABLET | Freq: Three times a day (TID) | ORAL | Status: AC
  Administered 2024-09-22 – 2024-09-24 (×7): 100 mg via ORAL
  Filled 2024-09-22 (×7): qty 1

## 2024-09-22 MED ORDER — ENOXAPARIN SODIUM 80 MG/0.8ML IJ SOSY
75.0000 mg | PREFILLED_SYRINGE | INTRAMUSCULAR | Status: DC
  Administered 2024-09-22 – 2024-09-25 (×4): 75 mg via SUBCUTANEOUS
  Filled 2024-09-22 (×5): qty 0.75

## 2024-09-22 MED ORDER — MAGNESIUM HYDROXIDE 400 MG/5ML PO SUSP
30.0000 mL | Freq: Every day | ORAL | Status: DC | PRN
  Administered 2024-09-24: 30 mL via ORAL
  Filled 2024-09-22: qty 30

## 2024-09-22 MED ORDER — ONDANSETRON HCL 4 MG/2ML IJ SOLN
4.0000 mg | Freq: Four times a day (QID) | INTRAMUSCULAR | Status: DC | PRN
  Administered 2024-09-22 – 2024-09-26 (×4): 4 mg via INTRAVENOUS
  Filled 2024-09-22 (×4): qty 2

## 2024-09-22 MED ORDER — SODIUM CHLORIDE 0.9 % IV SOLN
2.0000 g | Freq: Once | INTRAVENOUS | Status: AC
  Administered 2024-09-22: 2 g via INTRAVENOUS
  Filled 2024-09-22: qty 20

## 2024-09-22 MED ORDER — PHENAZOPYRIDINE HCL 100 MG PO TABS: 100.0000 mg | ORAL_TABLET | Freq: Three times a day (TID) | ORAL | Status: DC

## 2024-09-22 MED ORDER — ACETAMINOPHEN 650 MG RE SUPP: 650.0000 mg | Freq: Four times a day (QID) | RECTAL | Status: DC | PRN

## 2024-09-22 MED ORDER — METOCLOPRAMIDE HCL 5 MG/ML IJ SOLN
10.0000 mg | Freq: Once | INTRAMUSCULAR | Status: AC
  Administered 2024-09-22: 10 mg via INTRAVENOUS
  Filled 2024-09-22: qty 2

## 2024-09-22 MED ORDER — MORPHINE SULFATE (PF) 4 MG/ML IV SOLN
4.0000 mg | Freq: Once | INTRAVENOUS | Status: AC
  Administered 2024-09-22: 4 mg via INTRAVENOUS
  Filled 2024-09-22: qty 1

## 2024-09-22 NOTE — ED Provider Notes (Signed)
 [facility] Provider Note    Event Date/Time   First MD Initiated Contact with Patient 09/22/24 1432     (approximate)   History   Urinary Retention   HPI  Jasmine Larsen is a 58 y.o. female history of diabetes, hypertension, morbid obesity presents emergency department with recent UTI and is now having urinary retention.  States went to the doctor's office can only get a couple of drops out.  States she is also feeling dehydrated.  A lot of nausea, vomiting, chills, and back pain.  Feels a lot of pressure in her pelvic area.  Daughter states that she started having UTI symptoms last week Wednesday 7 over 6 days.  Took Azo without any relief.  Patient takes metformin , Jardiance , and Ozempic  for her diabetes.     Physical Exam   Triage Vital Signs: ED Triage Vitals  Encounter Vitals Group     BP 09/22/24 1259 (!) 155/79     Girls Systolic BP Percentile --      Girls Diastolic BP Percentile --      Boys Systolic BP Percentile --      Boys Diastolic BP Percentile --      Pulse Rate 09/22/24 1259 (!) 105     Resp 09/22/24 1259 20     Temp 09/22/24 1259 97.7 F (36.5 C)     Temp Source 09/22/24 1259 Oral     SpO2 09/22/24 1259 94 %     Weight 09/22/24 1256 (!) 347 lb (157.4 kg)     Height 09/22/24 1256 5' 2 (1.575 m)     Head Circumference --      Peak Flow --      Pain Score 09/22/24 1255 10     Pain Loc --      Pain Education --      Exclude from Growth Chart --     Most recent vital signs: Vitals:   09/22/24 1259 09/22/24 1711  BP: (!) 155/79 (!) 150/80  Pulse: (!) 105 92  Resp: 20 20  Temp: 97.7 F (36.5 C) 98 F (36.7 C)  SpO2: 94% 94%     General: Awake, no distress.   CV:  Good peripheral perfusion.  Resp:  Normal effort.  Abd:  No distention.  Tender in lower abdomen Other:  No CVA tenderness   ED Results / Procedures / Treatments   Labs (all labs ordered are listed, but only abnormal results are displayed) Labs  Reviewed  CBC WITH DIFFERENTIAL/PLATELET - Abnormal; Notable for the following components:      Result Value   WBC 11.1 (*)    RBC 5.15 (*)    Neutro Abs 8.8 (*)    All other components within normal limits  COMPREHENSIVE METABOLIC PANEL WITH GFR - Abnormal; Notable for the following components:   Sodium 127 (*)    Chloride 88 (*)    Glucose, Bld 336 (*)    Creatinine, Ser 1.04 (*)    Calcium  8.8 (*)    Alkaline Phosphatase 127 (*)    Anion gap 16 (*)    All other components within normal limits  URINALYSIS, ROUTINE W REFLEX MICROSCOPIC - Abnormal; Notable for the following components:   Color, Urine YELLOW (*)    APPearance CLOUDY (*)    Specific Gravity, Urine >1.046 (*)    Glucose, UA >=500 (*)    Hgb urine dipstick SMALL (*)    Protein, ur 30 (*)    Leukocytes,Ua  LARGE (*)    Bacteria, UA MANY (*)    All other components within normal limits  LACTIC ACID, PLASMA     EKG     RADIOLOGY CT abdomen pelvis IV contrast    PROCEDURES:   Procedures  Critical Care:  no Chief Complaint  Patient presents with   Urinary Retention      MEDICATIONS ORDERED IN ED: Medications  sodium chloride  0.9 % bolus 1,000 mL (1,000 mLs Intravenous New Bag/Given 09/22/24 1647)  metoCLOPramide  (REGLAN ) injection 10 mg (10 mg Intravenous Given 09/22/24 1646)  morphine  (PF) 4 MG/ML injection 4 mg (4 mg Intravenous Given 09/22/24 1646)  iohexol  (OMNIPAQUE ) 350 MG/ML injection 100 mL (100 mLs Intravenous Contrast Given 09/22/24 1535)  cefTRIAXone  (ROCEPHIN ) 2 g in sodium chloride  0.9 % 100 mL IVPB (0 g Intravenous Stopped 09/22/24 1756)     IMPRESSION / MDM / ASSESSMENT AND PLAN / ED COURSE  I reviewed the triage vital signs and the nursing notes.                              Differential diagnosis includes, but is not limited to, sepsis, urinary retention, UTI, pyelonephritis, kidney stone, infected kidney stone, bladder obstruction  Patient's presentation is most consistent  with acute illness / injury with system symptoms.    Medications given: Normal saline 1 L IV, morphine  4 mg IV, Reglan  4 mg IV  The patient had tried Zofran  at home so we will do the Reglan  here in the ED.  She does appear to be dry, tongue is white lips are cracked.  She is also tachycardic.  Therefore we will also do a lactic to ensure this is not sepsis versus dehydration.  CBC has mildly elevated WBC of 11.1, comprehensive metabolic panel has elevated glucose and decreased sodium which calculation of the glucose affecting sodium would make this more normal, however anion gap is elevated at 16  Will do CT abdomen pelvis IV contrast, In-N-Out cath to see if patient actually has urine in her bladder   Nursing staff was able to insert catheter.  Large amount of urine being expelled.  Do feel patient has some retention.  Urinalysis shows a lot of infection with large amount leuks, many bacteria, white blood cell clumps.  Also greater than 500 glucose.  CT abdomen pelvis IV contrast, independent review and interpretation by me as having bilateral pyelonephritis.  With the patient being diabetic, having bilateral pyelonephritis, with fever, chills and vomiting I do feel the patient would benefit from admission.  She is agreeable for this.  Consult hospitalist for admission, patient is agreeable for admission, Rocephin  2 g IV started here in the ED.  Hospitalist is still not contacted me at shift change at 7 PM.  Will transfer care to Dr. Clarine.  He will discuss with hospitalist for admission. FINAL CLINICAL IMPRESSION(S) / ED DIAGNOSES   Final diagnoses:  Pyelonephritis, acute  Dehydration  Acute urinary retention     Rx / DC Orders   ED Discharge Orders     None        Note:  This document was prepared using Dragon voice recognition software and may include unintentional dictation errors.    Gasper Devere ORN, PA-C 09/22/24 1904    Clarine Ozell LABOR, MD 09/22/24 [phone]

## 2024-09-22 NOTE — Assessment & Plan Note (Signed)
-   This is likely hypovolemic. - The patient will be hydrated with IV normal saline. - Will follow sodium levels.

## 2024-09-22 NOTE — Progress Notes (Signed)
 1. Failure to attend appointment with reason given    Appointment rescheduled by patient.

## 2024-09-22 NOTE — Assessment & Plan Note (Addendum)
-   The patient will be placed on supplemental coverage with Nov[employer] . - Will hold off Glucophage . - Jardiance  will be resumed.

## 2024-09-22 NOTE — ED Notes (Signed)
 See triage note  Presents with some urinary retention  States she has not been able to void much for the past 2 days  Unsure of fever at home but states she has had the chills  Afebrile on arrival

## 2024-09-22 NOTE — Progress Notes (Signed)
 Name: Jasmine Larsen   MRN: [id number]    DOB: April 04, 1966   Date:09/22/2024       Progress Note  Subjective  Chief Complaint  Chief Complaint  Patient presents with   Nausea   Emesis    X3 days ago   Dysuria    A week   Urinary Retention    Discussed the use of AI scribe software for clinical note transcription with the patient, who gave verbal consent to proceed.  History of Present Illness Jasmine Larsen is a 58 year old female who presents with urinary retention and dehydration.  Approximately one week ago, she began experiencing symptoms consistent with a urinary tract infection, including dysuria and increased urinary frequency. Recently, she has developed urinary retention, with her last urination occurring the previous night.  For the past three days, she has been unable to retain any food or fluids, consuming only a piece of toast and a little broth. Despite taking Zofran , her nausea and vomiting persist. She feels dehydrated , mouth is dry . No fever is present.  She experiences intermittent dyspnea when ill. She has chronic back pain, which she associates with her current lower back discomfort. Her current medication includes Zofran  for nausea, though the dose and frequency are unspecified.  No fever or other significant symptoms were noted. She reports wheezing and difficulty breathing intermittently when she is sick.    Patient Active Problem List   Diagnosis Date Noted   Chronic lower extremity pain (2ry area of Pain) (Bilateral) (R>L) 08/17/2024   Chronic knee pain (3ry area of Pain) (Bilateral) (R>L) 08/17/2024   Osteoarthritis involving multiple joints 08/17/2024   Class 3 severe obesity due to excess calories with serious comorbidity and body mass index (BMI) of 60.0 to 69.9 in adult (HCC) 08/17/2024   Chronic pain syndrome 08/16/2024   Pharmacologic therapy 08/16/2024   Disorder of skeletal system 08/16/2024   Problems influencing health status  08/16/2024   Snoring 06/29/2024   Chronic low back pain (1ry area of Pain) (Bilateral) (R>L) w/o sciatica 06/29/2024   Nausea 03/15/2024   IBS (irritable bowel syndrome) 02/19/2024   Mood disorder 02/19/2024   Elevated blood pressure reading 02/19/2024   Restless leg syndrome 12/20/2023   Need for influenza vaccination 12/20/2023   Encounter for immunization 12/20/2023   Hyponatremia 11/19/2023   Left foot pain 11/19/2023   Lymphedema 11/17/2023   Polyp of sigmoid colon 09/21/2023   Polyp of rectum 09/21/2023   Encounter for screening colonoscopy 09/21/2023   Burning with urination 08/09/2023   Colon cancer screening 08/09/2023   Need for hepatitis C screening test 08/09/2023   Encounter for screening for HIV 08/09/2023   Diabetic eye exam (HCC) 08/09/2023   Type 2 diabetes mellitus with complications (HCC) 08/09/2023   Hyperlipidemia associated with type 2 diabetes mellitus (HCC) 08/09/2023   Congestive heart failure (HCC) 08/09/2023   Vitamin D  deficiency 08/09/2023   Vitamin B 12 deficiency 08/09/2023   Type 2 diabetes mellitus with diabetic neuropathy, without long-term current use of insulin  (HCC) 08/09/2023   Bilateral sciatica 08/09/2023   Acute bronchitis with COPD (HCC) 08/09/2023   Calcaneal spur of both feet 08/09/2023   Left wrist pain 07/06/2023   Fall 07/01/2023   Multiple lacerations 07/01/2023   COPD with acute exacerbation (HCC) 04/27/2023   Degeneration of lumbar intervertebral disc 03/18/2023   Lumbar radiculopathy 03/18/2023   Acute pain of left shoulder 01/26/2023   Uncontrolled type 2 diabetes mellitus with hyperglycemia (  HCC) 12/15/2022   Acute midline low back pain with left-sided sciatica 12/15/2022   Lower extremity edema 12/15/2022   Anxiety and depression 12/15/2022   Pneumonia due to COVID-19 virus 07/19/2020   Chronic diastolic CHF (congestive heart failure) (HCC) 07/19/2020   Hypokalemia 07/19/2020   Hypocalcemia 07/19/2020   Tobacco abuse  07/19/2020   Hypophosphatemia 07/19/2020   Shortness of breath 07/13/2016   Morbid obesity (HCC) 07/13/2016    Social History   Tobacco Use   Smoking status: Every Day    Current packs/day: 3.00    Average packs/day: 3.0 packs/day for 42.9 years (128.8 ttl pk-yrs)    Types: Cigarettes    Start date: 1983    Passive exposure: Past   Smokeless tobacco: Never   Tobacco comments:    Smokes 3 PPD - khj 07/07/2024  Substance Use Topics   Alcohol use: No    Current Medications[1]  Allergies[2]  ROS  Ten systems reviewed and is negative except as mentioned in HPI    Objective  Vitals:   09/22/24 1114  BP: 118/74  Pulse: 87  Resp: 16  Temp: 98.4 F (36.9 C)  TempSrc: Oral  SpO2: 97%  Weight: (!) 347 lb 1.6 oz (157.4 kg)  Height: 5' 2 (1.575 m)    Body mass index is 63.49 kg/m.   Physical Exam CONSTITUTIONAL: Patient appears in mild  distress. Tachypnea  HEENT: Head atraumatic, normocephalic, neck supple. CARDIOVASCULAR: Normal rate, regular rhythm and normal heart sounds. No murmur heard. No BLE edema. PULMONARY: Effort normal and breath sounds normal. No respiratory distress. ABDOMINAL: There is no tenderness or distention. No costovertebral angle tenderness. MUSCULOSKELETAL: Normal gait. Without gross motor or sensory deficit. PSYCHIATRIC: Patient has a normal mood and affect. Behavior is normal. Judgment and thought content normal.  Recent Results (from the past 2160 hours)  Hemoglobin A1c     Status: Abnormal   Collection Time: 06/29/24  2:05 PM  Result Value Ref Range   Hgb A1c MFr Bld 7.6 (H) 4.6 - 6.5 %    Comment: Glycemic Control Guidelines for People with Diabetes:Non Diabetic:  <6%Goal of Therapy: <7%Additional Action Suggested:  >8%   Lipid panel     Status: Abnormal   Collection Time: 06/29/24  2:05 PM  Result Value Ref Range   Cholesterol 207 (H) 0 - 200 mg/dL    Comment: ATP III Classification       Desirable:  < 200 mg/dL                Borderline High:  200 - 239 mg/dL          High:  > = 759 mg/dL   Triglycerides 12.9 0.0 - 149.0 mg/dL    Comment: Normal:  <849 mg/dLBorderline High:  150 - 199 mg/dL   HDL 61.39 (L) >60.99 mg/dL   VLDL 82.5 0.0 - 59.9 mg/dL   LDL Cholesterol 848 (H) 0 - 99 mg/dL   Total CHOL/HDL Ratio 5     Comment:                Men          Women1/2 Average Risk     3.4          3.3Average Risk          5.0          4.42X Average Risk          9.6  7.13X Average Risk          15.0          11.0                       NonHDL 168.61     Comment: NOTE:  Non-HDL goal should be 30 mg/dL higher than patient's LDL goal (i.e. LDL goal of < 70 mg/dL, would have non-HDL goal of < 100 mg/dL)  Comprehensive metabolic panel     Status: Abnormal   Collection Time: 06/29/24  2:05 PM  Result Value Ref Range   Sodium 134 (L) 135 - 145 mEq/L   Potassium 4.4 3.5 - 5.1 mEq/L   Chloride 99 96 - 112 mEq/L   CO2 28 19 - 32 mEq/L   Glucose, Bld 120 (H) 70 - 99 mg/dL   BUN 12 6 - 23 mg/dL   Creatinine, Ser 9.34 0.40 - 1.20 mg/dL   Total Bilirubin 0.5 0.2 - 1.2 mg/dL   Alkaline Phosphatase 102 39 - 117 U/L   AST 11 0 - 37 U/L   ALT 13 0 - 35 U/L   Total Protein 7.2 6.0 - 8.3 g/dL   Albumin 4.2 3.5 - 5.2 g/dL   GFR 01.59 >39.99 mL/min    Comment: Calculated using the CKD-EPI Creatinine Equation (2021)   Calcium  9.1 8.4 - 10.5 mg/dL  Iron, TIBC and Ferritin Panel     Status: None   Collection Time: 06/29/24  2:05 PM  Result Value Ref Range   Iron 47 45 - 160 mcg/dL   TIBC 710 749 - 549 mcg/dL (calc)   %SAT 16 16 - 45 % (calc)   Ferritin 108 16 - 232 ng/mL  Compliance Drug Analysis, Ur     Status: None   Collection Time: 08/17/24 10:31 AM  Result Value Ref Range   Summary FINAL     Comment: ==================================================================== Compliance Drug Analysis, Ur ==================================================================== Test                             Result       Flag        Units  Drug Absent but Declared for Prescription Verification   Tizanidine                      Not Detected UNEXPECTED    Tizanidine , as indicated in the declared medication list, is not    always detected even when used as directed.  ==================================================================== Test                      Result    Flag   Units      Ref Range   Creatinine              87               mg/dL      >=79 ==================================================================== Declared Medications:  The flagging and interpretation on this report are based on the  following declared medications.  Unexpected results may arise from  inaccuracies in the declared medications.   **Note: The testing scope of this panel does not include smal l to  moderate amounts of these reported medications:   Tizanidine  (Zanaflex )   **Note: The testing scope of this panel does not include the  following reported medications:   Albuterol  (Ventolin  HFA)  Albuterol  (Duoneb)  Benzonatate  (Tessalon )  Dicyclomine  (Bentyl )  Empagliflozin  (Jardiance )  Fluticasone  (Trelegy)  Ipratropium (Duoneb)  Metformin  (Glucophage )  Ondansetron  (Zofran )  Ropinirole  (Requip )  Rosuvastatin  (Crestor )  Semaglutide  (Ozempic )  Umeclidinium (Trelegy)  Vilanterol (Trelegy)  Vitamin B12 ==================================================================== For clinical consultation, please call [phone]. ====================================================================   Magnesium      Status: None   Collection Time: 08/17/24 10:40 AM  Result Value Ref Range   Magnesium  2.1 1.6 - 2.3 mg/dL  Vitamin B12     Status: None   Collection Time: 08/17/24 10:40 AM  Result Value Ref Range   Vitamin B-12 430 232 - 1,245 pg/mL  Sedimentation rate     Status: Abnormal   Collection Time: 08/17/24 10:40 AM  Result Value Ref Range   Sed Rate 46 (H) 0 - 40 mm/hr  25-Hydroxy vitamin D  Lcms D2+D3     Status:  Abnormal   Collection Time: 08/17/24 10:40 AM  Result Value Ref Range   25-Hydroxy, Vitamin D  14 (L) ng/mL    Comment: Reference Range: All Ages: Target levels 30 - 100    25-Hydroxy, Vitamin D -2 8.9 ng/mL    Comment: This test was developed and its performance characteristics determined by [facility]. It has not been cleared or approved by the Food and Drug Administration.     25-Hydroxy, Vitamin D -3 4.8 ng/mL    Comment: This test was developed and its performance characteristics determined by [facility]. It has not been cleared or approved by the Food and Drug Administration.    C-reactive protein     Status: Abnormal   Collection Time: 08/17/24 10:40 AM  Result Value Ref Range   CRP 16 (H) 0 - 10 mg/L    Assessment & Plan Possible urinary tract infection with urinary retention and dehydration/Nausea and vomiting  UTI with urinary retention and dehydration, possible progression to pyelonephritis. Low-normal blood pressure likely due to dehydration. - Referred to emergency room for IV fluids and evaluation for  urinary retention management. - Informed hospital of her condition and impending arrival. - Provided printed referral note for hospital staff.  We were unable to get urine sample for urine dip and or urine culture             [1]  Current Outpatient Medications:    Accu-Chek Softclix Lancets lancets, 1 each by Other route daily., Disp: 100 each, Rfl: 2   albuterol  (PROVENTIL ) (2.5 MG/3ML) 0.083% nebulizer solution, Take 3 mLs (2.5 mg total) by nebulization every 6 (six) hours as needed for wheezing or shortness of breath., Disp: 150 mL, Rfl: 1   albuterol  (VENTOLIN  HFA) 108 (90 Base) MCG/ACT inhaler, Inhale 1-2 puffs into the lungs every 6 (six) hours as needed for wheezing or shortness of breath., Disp: 18 g, Rfl: 3   benzonatate  (TESSALON ) 200 MG capsule, Take 1 capsule (200 mg total) by mouth 2 (two) times daily as needed for cough., Disp: 21 capsule, Rfl: 0    blood glucose meter kit and supplies KIT, Use up to 4 times daily., Disp: 1 each, Rfl: 0   Blood Glucose Monitoring Suppl DEVI, 1 each by Does not apply route as directed. May substitute to any manufacturer covered by patient's insurance., Disp: 1 each, Rfl: 0   cyanocobalamin  (VITAMIN B12) 1000 MCG tablet, Take 1 tablet (1,000 mcg total) by mouth daily., Disp: 90 tablet, Rfl: 3   dicyclomine  (BENTYL ) 20 MG tablet, Take 1 tablet (20 mg total) by mouth 3 (three) times daily as needed for spasms., Disp: 90 tablet, Rfl: 3  empagliflozin  (JARDIANCE ) 10 MG TABS tablet, Take 1 tablet (10 mg total) by mouth daily before breakfast., Disp: 90 tablet, Rfl: 3   Fluticasone -Umeclidin-Vilant (TRELEGY ELLIPTA ) 100-62.5-25 MCG/ACT AEPB, Inhale 1 puff into the lungs daily., Disp: 1 each, Rfl: 11   glucose blood (ACCU-CHEK GUIDE TEST) test strip, Use as instructed, Disp: 100 each, Rfl: 1   ipratropium-albuterol  (DUONEB) 0.5-2.5 (3) MG/3ML SOLN, Take 3 mLs by nebulization every 6 (six) hours as needed., Disp: 120 mL, Rfl: 2   metFORMIN  (GLUCOPHAGE ) 500 MG tablet, Take 1 tablet (500 mg total) by mouth 2 (two) times daily with a meal., Disp: 180 tablet, Rfl: 3   ondansetron  (ZOFRAN -ODT) 4 MG disintegrating tablet, Take 1 tablet (4 mg total) by mouth daily as needed for nausea or vomiting., Disp: 30 tablet, Rfl: 3   rOPINIRole  (REQUIP ) 1 MG tablet, Take 1 tablet (1 mg total) by mouth at bedtime., Disp: 90 tablet, Rfl: 2   rosuvastatin  (CRESTOR ) 20 MG tablet, Take 1 tablet (20 mg total) by mouth at bedtime., Disp: 90 tablet, Rfl: 3   Semaglutide , 2 MG/DOSE, (OZEMPIC , 2 MG/DOSE,) 8 MG/3ML SOPN, Inject 2 mg into the skin once a week., Disp: 3 mL, Rfl: 12   tiZANidine  (ZANAFLEX ) 4 MG tablet, Take 1 tablet (4 mg total) by mouth at bedtime as needed for muscle spasms., Disp: 90 tablet, Rfl: 3 [2]  Allergies Allergen Reactions   Cymbalta  [Duloxetine  Hcl]     SI thoughts

## 2024-09-22 NOTE — ED Triage Notes (Signed)
 Sent from [facility]. Unable to urinate since yesterday. C/o pain during urination and emesis.   Emesis started 2 days ago.

## 2024-09-22 NOTE — ED Triage Notes (Signed)
 First Nurse Note:  Pt via POV from [facility], was sent over for urinary retention and nausea. Clinic reports VSS.

## 2024-09-22 NOTE — Assessment & Plan Note (Signed)
-   The patient will be admitted to a medical telemetry bed pending-we will continue antibiotic therapy with IV Rocephin . - Will continue hydration with IV normal saline. - The patient had a Foley catheter placed given her urinary retention. - Will place on Pyridium for urinary spasm. - Pain management will be provided.

## 2024-09-22 NOTE — H&P (Addendum)
 [place]   PATIENT NAME: Jasmine Larsen    MR#:  [id number]  DATE OF BIRTH:  1966/10/04  DATE OF ADMISSION:  09/22/2024  PRIMARY CARE PHYSICIAN: Bair, Kalpana, MD   Patient is coming from: Home  REQUESTING/REFERRING PHYSICIAN: Clarine Sharper, MD  CHIEF COMPLAINT:   Chief Complaint  Patient presents with   Urinary Retention    HISTORY OF PRESENT ILLNESS:  MELISA DONOFRIO is a 58 y.o. Caucasian female with medical history significant for diastolic CHF, asthma, COPD, DDD, dyslipidemia, hypertension, lumbosacral spinal stenosis and type 2 diabetes mellitus, who presented to the ER with acute onset of urinary retention over the last couple of days with associated dysuria without hematuria, nausea and vomiting without diarrhea and bilateral flank pain and tenderness.  The patient has been having cold chills but did not check her temperature.  She denied any worsening cough or wheezing or shortness of breath.  No chest pain or palpitations.  ED Course: When the patient came to the ER, vital signs were within normal and their BP was 155/79 with heart rate of 105.  Labs revealed hyponatremia 127 and hypochloremia of 88 with hyperglycemia 336 and creatinine 1.04, anion gap of 16, alk phos 127 with otherwise unremarkable CMP.  CBC showed cytosis of 11.1 with neutrophilia.  UA was positive for UTI and showed more than 500 glucose and specific gravity more than 1046. EKG as reviewed by me : None. Imaging: Abdominal and pelvic CT scan with contrast revealed possible bilateral pyelonephritis and fat-containing periumbilical hernia.  The patient was given 2 g of IV Rocephin , 10 mg of IV Reglan , 4 mg of IV morphine  sulfate and 1 L bolus of IV normal saline.  She will be admitted to a medical telemetry bed for further evaluation and management. PAST MEDICAL HISTORY:   Past Medical History:  Diagnosis Date   (HFpEF) heart failure with preserved ejection fraction (HCC)    a.) TTE 07/14/2016:  EF 65-70%, no RWMAs, norm RVSF, mild MR; b.) TTE 08/12/2023: EF 55-60%, no RWMAs, G1DD, norm RVSF   Acute respiratory failure with hypoxia (HCC)    Anxiety    Asthma    Chest pain    COPD (chronic obstructive pulmonary disease) (HCC)    DDD (degenerative disc disease), lumbar    Depression    Hepatic steatosis    History of 2019 novel coronavirus disease (COVID-19) 07/19/2020   HLD (hyperlipidemia)    HTN (hypertension)    Lumbosacral spinal stenosis    Miscarriage    Morbid obesity (HCC)    Multiple gastric ulcers    Osteoarthritis    Peripheral edema    Pneumonia due to COVID-19 virus    Sepsis due to pneumonia (HCC)    Sigmoid polyp    T2DM (type 2 diabetes mellitus) (HCC)    Vitamin B12 deficiency    Vitamin D  deficiency     PAST SURGICAL HISTORY:   Past Surgical History:  Procedure Laterality Date   CESAREAN SECTION     x3   COLONOSCOPY WITH PROPOFOL  N/A 09/21/2023   Procedure: COLONOSCOPY WITH PROPOFOL ;  Surgeon: Unk Corinn Skiff, MD;  Location: [facility] ENDOSCOPY;  Service: Gastroenterology;  Laterality: N/A;   MASS EXCISION Right 10/28/2023   Procedure: INCISION AND REMOVAL OF RIGHT FOREARM MASS;  Surgeon: Edie Norleen PARAS, MD;  Location: [facility] ORS;  Service: Orthopedics;  Laterality: Right;   POLYPECTOMY  09/21/2023   Procedure: POLYPECTOMY;  Surgeon: Unk Corinn Skiff, MD;  Location:  [facility] ENDOSCOPY;  Service: Gastroenterology;;   TUBAL LIGATION  April 21 1991    SOCIAL HISTORY:   Social History   Tobacco Use   Smoking status: Every Day    Current packs/day: 3.00    Average packs/day: 3.0 packs/day for 42.9 years (128.8 ttl pk-yrs)    Types: Cigarettes    Start date: 1983    Passive exposure: Past   Smokeless tobacco: Never   Tobacco comments:    Smokes 3 PPD - khj 07/07/2024  Substance Use Topics   Alcohol use: No    FAMILY HISTORY:   Family History  Problem Relation Age of Onset   Asthma Mother    Diabetes Mother    COPD Mother    Heart Problems  Mother    Diabetes Brother    COPD Brother    Congestive Heart Failure Brother    Early death Brother    Alcohol abuse Maternal Grandmother    Tuberculosis Maternal Grandmother    Lung cancer Maternal Grandmother     DRUG ALLERGIES:  Allergies[1]  REVIEW OF SYSTEMS:   ROS As per history of present illness. All pertinent systems were reviewed above. Constitutional, HEENT, cardiovascular, respiratory, GI, GU, musculoskeletal, neuro, psychiatric, endocrine, integumentary and hematologic systems were reviewed and are otherwise negative/unremarkable except for positive findings mentioned above in the HPI.   MEDICATIONS AT HOME:   Prior to Admission medications  Medication Sig Start Date End Date Taking? Authorizing Provider  albuterol  (PROVENTIL ) (2.5 MG/3ML) 0.083% nebulizer solution Take 3 mLs (2.5 mg total) by nebulization every 6 (six) hours as needed for wheezing or shortness of breath. 06/30/24  Yes Bair, Kalpana, MD  albuterol  (VENTOLIN  HFA) 108 (90 Base) MCG/ACT inhaler Inhale 1-2 puffs into the lungs every 6 (six) hours as needed for wheezing or shortness of breath. 06/29/24  Yes Bair, Kalpana, MD  benzonatate  (TESSALON ) 200 MG capsule Take 1 capsule (200 mg total) by mouth 2 (two) times daily as needed for cough. 06/29/24  Yes Bair, Kalpana, MD  dicyclomine  (BENTYL ) 20 MG tablet Take 1 tablet (20 mg total) by mouth 3 (three) times daily as needed for spasms. 06/29/24  Yes Bair, Kalpana, MD  Fluticasone -Umeclidin-Vilant (TRELEGY ELLIPTA ) 100-62.5-25 MCG/ACT AEPB Inhale 1 puff into the lungs daily. 06/29/24  Yes Bair, Kalpana, MD  ipratropium-albuterol  (DUONEB) 0.5-2.5 (3) MG/3ML SOLN Take 3 mLs by nebulization every 6 (six) hours as needed. 06/29/24  Yes Bair, Kalpana, MD  metFORMIN  (GLUCOPHAGE ) 500 MG tablet Take 1 tablet (500 mg total) by mouth 2 (two) times daily with a meal. 06/29/24  Yes Bair, Luke, MD  ondansetron  (ZOFRAN -ODT) 4 MG disintegrating tablet Take 1 tablet (4 mg total)  by mouth daily as needed for nausea or vomiting. 06/29/24  Yes Bair, Kalpana, MD  rOPINIRole  (REQUIP ) 1 MG tablet Take 1 tablet (1 mg total) by mouth at bedtime. 06/29/24  Yes Bair, Kalpana, MD  rosuvastatin  (CRESTOR ) 20 MG tablet Take 1 tablet (20 mg total) by mouth at bedtime. 06/29/24  Yes Bair, Kalpana, MD  Semaglutide , 2 MG/DOSE, (OZEMPIC , 2 MG/DOSE,) 8 MG/3ML SOPN Inject 2 mg into the skin once a week. 06/29/24  Yes Bair, Kalpana, MD  tiZANidine  (ZANAFLEX ) 4 MG tablet Take 1 tablet (4 mg total) by mouth at bedtime as needed for muscle spasms. 06/29/24  Yes Bair, Kalpana, MD  Accu-Chek Softclix Lancets lancets 1 each by Other route daily. 03/11/24   Hope Merle, MD  blood glucose meter kit and supplies KIT Use up to 4 times  daily. 08/17/23   Hope Merle, MD  Blood Glucose Monitoring Suppl DEVI 1 each by Does not apply route as directed. May substitute to any manufacturer covered by patient's insurance. 08/13/23   Hope Merle, MD  cyanocobalamin  (VITAMIN B12) 1000 MCG tablet Take 1 tablet (1,000 mcg total) by mouth daily. Patient not taking: Reported on 09/22/2024 08/09/23   Hope Merle, MD  empagliflozin  (JARDIANCE ) 10 MG TABS tablet Take 1 tablet (10 mg total) by mouth daily before breakfast. 06/29/24   Bair, Kalpana, MD  glucose blood (ACCU-CHEK GUIDE TEST) test strip Use as instructed 07/29/24   Bair, Luke, MD      VITAL SIGNS:  Blood pressure (!) 150/82, pulse (!) 104, temperature 98.6 F (37 C), resp. rate 18, height 5' 2 (1.575 m), weight (!) 157.4 kg, SpO2 98%.  PHYSICAL EXAMINATION:  Physical Exam  GENERAL:  58 y.o.-year-old Caucasian female patient lying in the bed with no acute distress.  EYES: Pupils equal, round, reactive to light and accommodation. No scleral icterus. Extraocular muscles intact.  HEENT: Head atraumatic, normocephalic. Oropharynx and nasopharynx clear.  NECK:  Supple, no jugular venous distention. No thyroid  enlargement, no tenderness.  LUNGS: Normal breath  sounds bilaterally, no wheezing, rales,rhonchi or crepitation. No use of accessory muscles of respiration.  CARDIOVASCULAR: Regular rate and rhythm, S1, S2 normal. No murmurs, rubs, or gallops.  ABDOMEN: Soft, nondistended, nontender. Bowel sounds present. No organomegaly or mass.  Bilateral CVA tenderness. EXTREMITIES: No pedal edema, cyanosis, or clubbing.  NEUROLOGIC: Cranial nerves II through XII are intact. Muscle strength 5/5 in all extremities. Sensation intact. Gait not checked.  PSYCHIATRIC: The patient is alert and oriented x 3.  Normal affect and good eye contact. SKIN: No obvious rash, lesion, or ulcer.   LABORATORY PANEL:   CBC Recent Labs  Lab 09/22/24 1258  WBC 11.1*  HGB 14.2  HCT 43.3  PLT 174   ------------------------------------------------------------------------------------------------------------------  Chemistries  Recent Labs  Lab 09/22/24 1258  NA 127*  K 3.7  CL 88*  CO2 23  GLUCOSE 336*  BUN 12  CREATININE 1.04*  CALCIUM  8.8*  AST 25  ALT 25  ALKPHOS 127*  BILITOT 1.0   ------------------------------------------------------------------------------------------------------------------  Cardiac Enzymes No results for input(s): TROPONINI in the last 168 hours. ------------------------------------------------------------------------------------------------------------------  RADIOLOGY:  CT ABDOMEN PELVIS W CONTRAST Result Date: 09/22/2024 EXAM: CT ABDOMEN AND PELVIS WITH CONTRAST 09/22/2024 03:42:50 PM TECHNIQUE: CT of the abdomen and pelvis was performed with the administration of 100 mL of iohexol  (OMNIPAQUE ) 350 MG/ML injection. Multiplanar reformatted images are provided for review. Automated exposure control, iterative reconstruction, and/or weight-based adjustment of the mA/kV was utilized to reduce the radiation dose to as low as reasonably achievable. COMPARISON: None available. CLINICAL HISTORY: UTI, recurrent/complicated (Female).  FINDINGS: LOWER CHEST: No acute abnormality. LIVER: The liver is unremarkable. GALLBLADDER AND BILE DUCTS: Gallbladder is unremarkable. No biliary ductal dilatation. SPLEEN: No acute abnormality. PANCREAS: No acute abnormality. ADRENAL GLANDS: No acute abnormality. KIDNEYS, URETERS AND BLADDER: Multiple areas of hypodensity involving both kidneys on the delayed post-contrast imaging suggesting possible bilateral pyelonephritis. No stones in the kidneys or ureters. No hydronephrosis. No perinephric or periureteral stranding. Urinary bladder is unremarkable. GI AND BOWEL: Stomach demonstrates no acute abnormality. There is no bowel obstruction. PERITONEUM AND RETROPERITONEUM: Fat-containing periumbilical hernia. No ascites. No free air. VASCULATURE: Aorta is normal in caliber. LYMPH NODES: No lymphadenopathy. REPRODUCTIVE ORGANS: No acute abnormality. BONES AND SOFT TISSUES: No acute osseous abnormality. No focal soft tissue abnormality. IMPRESSION: 1. Possible  bilateral pyelonephritis. 2. Fat-containing periumbilical hernia. Electronically signed by: Lynwood Seip MD 09/22/2024 04:08 PM EST RP Workstation: [id number]      IMPRESSION AND PLAN:  Assessment and Plan: * Pyelonephritis - The patient will be admitted to a medical telemetry bed pending-we will continue antibiotic therapy with IV Rocephin . - Will continue hydration with IV normal saline. - The patient had a Foley catheter placed given her urinary retention. - Will place on Pyridium for urinary spasm. - Pain management will be provided.  Hyponatremia - This is likely hypovolemic. - The patient will be hydrated with IV normal saline. - Will follow sodium levels.  Uncontrolled type 2 diabetes mellitus with hyperglycemia, without long-term current use of insulin  (HCC) - The patient will be placed on supplemental coverage with NovoLog . - Will hold off Glucophage . - Jardiance  will be resumed.  Chronic diastolic CHF (congestive heart failure)  (HCC) - Will continue Jardiance .  No current exacerbation.   DVT prophylaxis: Lovenox .  Advanced Care Planning:  Code Status: full code.  Family Communication:  The plan of care was discussed in details with the patient (and family). I answered all questions. The patient agreed to proceed with the above mentioned plan. Further management will depend upon hospital course. Disposition Plan: Back to previous home environment Consults called: none.  All the records are reviewed and case discussed with ED provider.  Status is: Inpatient  At the time of the admission, it appears that the appropriate admission status for this patient is inpatient.  This is judged to be reasonable and necessary in order to provide the required intensity of service to ensure the patient's safety given the presenting symptoms, physical exam findings and initial radiographic and laboratory data in the context of comorbid conditions.  The patient requires inpatient status due to high intensity of service, high risk of further deterioration and high frequency of surveillance required.  I certify that at the time of admission, it is my clinical judgment that the patient will require inpatient hospital care extending more than 2 midnights.                            Dispo: The patient is from: Home              Anticipated d/c is to: Home              Patient currently is not medically stable to d/c.              Difficult to place patient: No  Madison DELENA Peaches M.D on 09/22/2024 at 11:17 PM  [facility]   From 7 PM-7 AM, contact night-coverage [email or web address]  CC: Primary care physician; Bair, Kalpana, MD     [1]  Allergies Allergen Reactions   Cymbalta  [Duloxetine  Hcl]     SI thoughts

## 2024-09-22 NOTE — Assessment & Plan Note (Signed)
-   Will continue Jardiance .  No current exacerbation.

## 2024-09-23 LAB — BASIC METABOLIC PANEL WITH GFR
Anion gap: 11 (ref 5–15)
BUN: 11 mg/dL (ref 6–20)
CO2: 25 mmol/L (ref 22–32)
Calcium: 8.4 mg/dL — ABNORMAL LOW (ref 8.9–10.3)
Chloride: 96 mmol/L — ABNORMAL LOW (ref 98–111)
Creatinine, Ser: 0.86 mg/dL (ref 0.44–1.00)
GFR, Estimated: >60 mL/min (ref >60)
Glucose, Bld: 277 mg/dL — ABNORMAL HIGH (ref 70–99)
Potassium: 3.3 mmol/L — ABNORMAL LOW (ref 3.5–5.1)
Sodium: 133 mmol/L — ABNORMAL LOW (ref 135–145)

## 2024-09-23 LAB — CBC
HCT: 38.5 % (ref 36.0–46.0)
Hemoglobin: 12.4 g/dL (ref 12.0–15.0)
MCH: 27.3 pg (ref 26.0–34.0)
MCHC: 32.2 g/dL (ref 30.0–36.0)
MCV: 84.6 fL (ref 80.0–100.0)
Platelets: 179 K/uL (ref 150–400)
RBC: 4.55 MIL/uL (ref 3.87–5.11)
RDW: 13.7 % (ref 11.5–15.5)
WBC: 8.7 K/uL (ref 4.0–10.5)
nRBC: 0 % (ref 0.0–0.2)

## 2024-09-23 LAB — HIV ANTIBODY (ROUTINE TESTING W REFLEX): HIV Screen 4th Generation wRfx: NONREACTIVE

## 2024-09-23 LAB — GLUCOSE, CAPILLARY
Glucose-Capillary: 339 mg/dL — ABNORMAL HIGH (ref 70–99)
Glucose-Capillary: 257 mg/dL — ABNORMAL HIGH (ref 70–99)
Glucose-Capillary: 184 mg/dL — ABNORMAL HIGH (ref 70–99)

## 2024-09-23 MED ORDER — INSULIN ASPART 100 UNIT/ML IJ SOLN
0.0000 [IU] | Freq: Three times a day (TID) | INTRAMUSCULAR | Status: DC
  Administered 2024-09-23: 11 [IU] via SUBCUTANEOUS
  Administered 2024-09-23: 15 [IU] via SUBCUTANEOUS
  Administered 2024-09-24 (×2): 11 [IU] via SUBCUTANEOUS
  Administered 2024-09-24: 7 [IU] via SUBCUTANEOUS
  Administered 2024-09-25: 1 [IU] via SUBCUTANEOUS
  Administered 2024-09-25: 4 [IU] via SUBCUTANEOUS
  Administered 2024-09-25: 7 [IU] via SUBCUTANEOUS
  Administered 2024-09-26: 12:00:00 4 [IU] via SUBCUTANEOUS
  Administered 2024-09-26: 09:00:00 7 [IU] via SUBCUTANEOUS
  Filled 2024-09-23: qty 7
  Filled 2024-09-23: qty 11
  Filled 2024-09-23: qty 7
  Filled 2024-09-23: qty 1
  Filled 2024-09-23: qty 4
  Filled 2024-09-23: qty 7
  Filled 2024-09-23: qty 4
  Filled 2024-09-23: qty 15
  Filled 2024-09-23 (×2): qty 11

## 2024-09-23 MED ORDER — BUDESON-GLYCOPYRROL-FORMOTEROL 160-9-4.8 MCG/ACT IN AERO
2.0000 | INHALATION_SPRAY | Freq: Two times a day (BID) | RESPIRATORY_TRACT | Status: DC
  Administered 2024-09-23 – 2024-09-26 (×7): 2 via RESPIRATORY_TRACT
  Filled 2024-09-23: qty 5.9

## 2024-09-23 MED ORDER — IPRATROPIUM-ALBUTEROL 0.5-2.5 (3) MG/3ML IN SOLN: 3.0000 mL | Freq: Four times a day (QID) | RESPIRATORY_TRACT | Status: DC | PRN

## 2024-09-23 MED ORDER — SODIUM CHLORIDE 0.9 % IV SOLN
2.0000 g | INTRAVENOUS | Status: DC
  Administered 2024-09-23 – 2024-09-24 (×2): 2 g via INTRAVENOUS
  Filled 2024-09-23 (×2): qty 20

## 2024-09-23 MED ORDER — POTASSIUM CHLORIDE 20 MEQ PO PACK
40.0000 meq | PACK | Freq: Once | ORAL | Status: AC
  Administered 2024-09-23: 40 meq via ORAL
  Filled 2024-09-23: qty 2

## 2024-09-23 MED ORDER — PANTOPRAZOLE SODIUM 40 MG PO TBEC
40.0000 mg | DELAYED_RELEASE_TABLET | Freq: Every day | ORAL | Status: DC
  Administered 2024-09-23 – 2024-09-26 (×4): 40 mg via ORAL
  Filled 2024-09-23 (×4): qty 1

## 2024-09-23 NOTE — Plan of Care (Signed)
°  Problem: Activity: Goal: Risk for activity intolerance will decrease Outcome: Progressing   Problem: Elimination: Goal: Will not experience complications related to bowel motility Outcome: Progressing   Problem: Coping: Goal: Level of anxiety will decrease Outcome: Progressing   Problem: Pain Managment: Goal: General experience of comfort will improve and/or be controlled Outcome: Progressing   Problem: Safety: Goal: Ability to remain free from injury will improve Outcome: Progressing

## 2024-09-23 NOTE — Progress Notes (Signed)
 Progress Note   Patient: Jasmine Larsen DOB: 07-11-1966 DOA: 09/22/2024     1 DOS: the patient was seen and examined on 09/23/2024   Brief hospital course: H&P HPI  Jasmine Larsen is a 58 y.o. Caucasian female with medical history significant for diastolic CHF, asthma, COPD, DDD, dyslipidemia, hypertension, lumbosacral spinal stenosis and type 2 diabetes mellitus, who presented to the ER with acute onset of urinary retention over the last couple of days with associated dysuria without hematuria, nausea and vomiting without diarrhea and bilateral flank pain and tenderness.  The patient has been having cold chills but did not check her temperature.  She denied any worsening cough or wheezing or shortness of breath.  No chest pain or palpitations.   ED Course: When the patient came to the ER, vital signs were within normal and their BP was 155/79 with heart rate of 105.  Labs revealed hyponatremia 127 and hypochloremia of 88 with hyperglycemia 336 and creatinine 1.04, anion gap of 16, alk phos 127 with otherwise unremarkable CMP.  CBC showed cytosis of 11.1 with neutrophilia.  UA was positive for UTI and showed more than 500 glucose and specific gravity more than 1046. EKG as reviewed by me : None. Imaging: Abdominal and pelvic CT scan with contrast revealed possible bilateral pyelonephritis and fat-containing periumbilical hernia.   The patient was given 2 g of IV Rocephin , 10 mg of IV Reglan , 4 mg of IV morphine  sulfate and 1 L bolus of IV normal saline.  She will be admitted to a medical telemetry bed for further evaluation and management.  Assessment and Plan: * Pyelonephritis Continue IV ceftriaxone   F/u Urine culture Plan for voiding trial in the AM  Continue pyridium  for one more day.    Hyponatremia - This is likely hypovolemic. Continue to monitor with IV fluids   Uncontrolled type 2 diabetes mellitus with hyperglycemia, without long-term current use of insulin   (HCC) A1c 7.6 recently.  SSI Hold home metformin  given received IV contrast and hold jardiance  in setting of UTI.    Chronic diastolic CHF (congestive heart failure) (HCC) - Not in exacerbation. Hold hoem jardiance .     Class3 obesity, morbid obesity  BMI 63         Subjective: Had some nausea and vomitng a few days prior to presenting. Had food last night and able to keep down. Having some flank pain.   Physical Exam: Vitals:   09/22/24 1711 09/22/24 2032 09/23/24 0529 09/23/24 0756  BP: (!) 150/80 (!) 150/82 130/75 125/63  Pulse: 92 (!) 104 96 91  Resp: 20 18 17 17   Temp: 98 F (36.7 C) 98.6 F (37 C) 98.7 F (37.1 C) 98.2 F (36.8 C)  TempSrc:      SpO2: 94% 98% 96% 96%  Weight:      Height:       Physical Exam  Constitutional: In no distress.  Cardiovascular: Normal rate, regular rhythm. No lower extremity edema  Pulmonary: Non labored breathing on room air, diffuse wheezing   Abdominal: Soft. Obese, Non distended and non tender Musculoskeletal: bilateral flank pain to mild TTP Neurological: Alert and oriented to person, place, and time. Non focal  GU: foley in place with urine.  Skin: Skin is warm and dry.   Data Reviewed:     Latest Ref Rng & Units 09/23/2024    5:55 AM 09/22/2024   12:58 PM 06/29/2024    2:05 PM  BMP  Glucose 70 - 99  mg/dL 722  663  879   BUN 6 - 20 mg/dL 11  12  12    Creatinine 0.44 - 1.00 mg/dL 9.13  8.95  9.34   Sodium 135 - 145 mmol/L 133  127  134   Potassium 3.5 - 5.1 mmol/L 3.3  3.7  4.4   Chloride 98 - 111 mmol/L 96  88  99   CO2 22 - 32 mmol/L 25  23  28    Calcium  8.9 - 10.3 mg/dL 8.4  8.8  9.1       Latest Ref Rng & Units 09/23/2024    5:55 AM 09/22/2024   12:58 PM 11/11/2023   12:50 PM  CBC  WBC 4.0 - 10.5 K/uL 8.7  11.1  6.5   Hemoglobin 12.0 - 15.0 g/dL 87.5  85.7  85.4   Hematocrit 36.0 - 46.0 % 38.5  43.3  42.8   Platelets 150 - 400 K/uL 179  174  213      Family Communication:  None  Disposition: Status is: Inpatient Remains inpatient appropriate because: IV antibiotics   Planned Discharge Destination: Home    Time spent: 35 minutes  Author: Alban Pepper, MD 09/23/2024 9:18 AM  For on call review [email or web address].

## 2024-09-23 NOTE — Inpatient Diabetes Management (Signed)
 Inpatient Diabetes Program Recommendations  AACE/ADA: New Consensus Statement on Inpatient Glycemic Control   Target Ranges:  Prepandial:   less than 140 mg/dL      Peak postprandial:   less than 180 mg/dL (1-2 hours)      Critically ill patients:  140 - 180 mg/dL    Latest Reference Range & Units 09/22/24 12:58 09/23/24 05:55  Glucose 70 - 99 mg/dL 663 (H) 722 (H)    Review of Glycemic Control  Diabetes history: DM2 Outpatient Diabetes medications: Metformin  500 mg BID, Jardiance  10 mg daily, and Ozempic  2 mg Qweek  Current orders for Inpatient glycemic control: None  Inpatient Diabetes Program Recommendations:    Insulin : Please consider ordering CBGs A&HS, Novolog  0-15 units TID with meals and Novolog  0-5 units at bedtime.  Outpatient DM:  May want to consider stopping Jardiance  outpatient due to risk of UTI and have patient follow up with PCP regarding DM management.  Thanks, Earnie Gainer, RN, MSN, CDCES Diabetes Coordinator Inpatient Diabetes Program [phone] (Team Pager from 8am to 5pm)

## 2024-09-23 NOTE — Plan of Care (Signed)

## 2024-09-23 NOTE — Progress Notes (Signed)
 Mobility Specialist - Progress Note   09/23/24 1518  Mobility  Activity Stood at bedside;Dangled on edge of bed  Level of Assistance Contact guard assist, steadying assist  Assistive Device Front wheel walker  Activity Response Tolerated well  Mobility visit 1 Mobility  Mobility Specialist Start Time (ACUTE ONLY) 1458  Mobility Specialist Stop Time (ACUTE ONLY) 1515  Mobility Specialist Time Calculation (min) (ACUTE ONLY) 17 min   Pt fowler upon entry, utilizing RA. Pt expressed LE pain however agreeable to limited OOB activity this date. Pt completed bed mob w/ HHA to bring trunk from sup to sit, extra time required to bring RLE EOB. Pt STS to RW CGA-MinG, standing for ~2 mins before returning EOB. Pt left in fowler position with alarm set and needs within reach.  America Silvan Mobility Specialist 09/23/2024 3:24 PM

## 2024-09-23 NOTE — Plan of Care (Signed)

## 2024-09-23 NOTE — TOC CM/SW Note (Signed)
 Transition of Care [facility]) - Inpatient Brief Assessment   Patient Details  Name: Jasmine Larsen MRN: [id number] Date of Birth: 1966/03/07  Transition of Care [facility]) CM/SW Contact:    Nathanael CHRISTELLA Ring, RN Phone Number: 09/23/2024, 11:24 AM   Clinical Narrative:   Transition of Care [facility]) Screening Note   Patient Details  Name: Jasmine Larsen Date of Birth: 02-Dec-1965   Transition of Care [facility]) CM/SW Contact:    Nathanael CHRISTELLA Ring, RN Phone Number: 09/23/2024, 11:24 AM    Transition of Care [facility]) has reviewed patient and no TOC needs have been identified at this time. If new patient transition needs arise, please place a TOC consult.    Transition of Care Asessment: Insurance and Status: Insurance coverage has been reviewed Patient has primary care physician: Yes Home environment has been reviewed: yes Prior level of function:: independent Prior/Current Home Services: No current home services Social Drivers of Health Review: SDOH reviewed no interventions necessary Readmission risk has been reviewed: Yes Transition of care needs: no transition of care needs at this time

## 2024-09-24 LAB — URINE CULTURE: Culture: NO GROWTH

## 2024-09-24 LAB — CBC WITH DIFFERENTIAL/PLATELET
Abs Immature Granulocytes: 0.04 K/uL (ref 0.00–0.07)
Basophils Absolute: 0 K/uL (ref 0.0–0.1)
Basophils Relative: 1 %
Eosinophils Absolute: 0.1 K/uL (ref 0.0–0.5)
Eosinophils Relative: 1 %
HCT: 34.5 % — ABNORMAL LOW (ref 36.0–46.0)
Hemoglobin: 11.3 g/dL — ABNORMAL LOW (ref 12.0–15.0)
Immature Granulocytes: 1 %
Lymphocytes Relative: 22 %
Lymphs Abs: 1.4 K/uL (ref 0.7–4.0)
MCH: 27.6 pg (ref 26.0–34.0)
MCHC: 32.8 g/dL (ref 30.0–36.0)
MCV: 84.4 fL (ref 80.0–100.0)
Monocytes Absolute: 0.7 K/uL (ref 0.1–1.0)
Monocytes Relative: 10 %
Neutro Abs: 4.3 K/uL (ref 1.7–7.7)
Neutrophils Relative %: 65 %
Platelets: 166 K/uL (ref 150–400)
RBC: 4.09 MIL/uL (ref 3.87–5.11)
RDW: 13.8 % (ref 11.5–15.5)
Smear Review: NORMAL
WBC: 6.5 K/uL (ref 4.0–10.5)
nRBC: 0 % (ref 0.0–0.2)

## 2024-09-24 LAB — BASIC METABOLIC PANEL WITH GFR
Anion gap: 11 (ref 5–15)
BUN: 10 mg/dL (ref 6–20)
CO2: 25 mmol/L (ref 22–32)
Calcium: 7.8 mg/dL — ABNORMAL LOW (ref 8.9–10.3)
Chloride: 98 mmol/L (ref 98–111)
Creatinine, Ser: 0.76 mg/dL (ref 0.44–1.00)
GFR, Estimated: >60 mL/min (ref >60)
Glucose, Bld: 311 mg/dL — ABNORMAL HIGH (ref 70–99)
Potassium: 3 mmol/L — ABNORMAL LOW (ref 3.5–5.1)
Sodium: 135 mmol/L (ref 135–145)

## 2024-09-24 LAB — MAGNESIUM: Magnesium: 2.1 mg/dL (ref 1.7–2.4)

## 2024-09-24 LAB — GLUCOSE, CAPILLARY
Glucose-Capillary: 264 mg/dL — ABNORMAL HIGH (ref 70–99)
Glucose-Capillary: 213 mg/dL — ABNORMAL HIGH (ref 70–99)
Glucose-Capillary: 266 mg/dL — ABNORMAL HIGH (ref 70–99)
Glucose-Capillary: 165 mg/dL — ABNORMAL HIGH (ref 70–99)

## 2024-09-24 MED ORDER — ROSUVASTATIN CALCIUM 20 MG PO TABS
20.0000 mg | ORAL_TABLET | Freq: Every day | ORAL | Status: DC
  Administered 2024-09-25 – 2024-09-26 (×2): 20 mg via ORAL
  Filled 2024-09-24 (×2): qty 1

## 2024-09-24 MED ORDER — FERROUS SULFATE 325 (65 FE) MG PO TABS
325.0000 mg | ORAL_TABLET | Freq: Every day | ORAL | Status: DC
  Administered 2024-09-25 – 2024-09-26 (×2): 325 mg via ORAL
  Filled 2024-09-24 (×2): qty 1

## 2024-09-24 MED ORDER — INSULIN GLARGINE-YFGN 100 UNIT/ML ~~LOC~~ SOPN
10.0000 [IU] | PEN_INJECTOR | SUBCUTANEOUS | Status: DC
  Filled 2024-09-24: qty 3

## 2024-09-24 MED ORDER — CHOLECALCIFEROL 10 MCG (400 UNIT) PO TABS
400.0000 [IU] | ORAL_TABLET | Freq: Every day | ORAL | Status: DC
  Filled 2024-09-24: qty 1

## 2024-09-24 MED ORDER — POTASSIUM CHLORIDE 20 MEQ PO PACK
40.0000 meq | PACK | ORAL | Status: AC
  Administered 2024-09-24 (×2): 40 meq via ORAL
  Filled 2024-09-24 (×2): qty 2

## 2024-09-24 MED ORDER — INSULIN GLARGINE 100 UNIT/ML ~~LOC~~ SOLN
10.0000 [IU] | Freq: Every day | SUBCUTANEOUS | Status: DC
  Administered 2024-09-24: 10 [IU] via SUBCUTANEOUS
  Filled 2024-09-24 (×2): qty 0.1

## 2024-09-24 NOTE — Progress Notes (Addendum)
 Progress Note   Patient: Jasmine Larsen FMW:[id number] DOB: Aug 23, 1966 DOA: 09/22/2024     2 DOS: the patient was seen and examined on 09/24/2024   Brief hospital course: H&P HPI  Jasmine Larsen is a 58 y.o. Caucasian female with medical history significant for diastolic CHF, asthma, COPD, DDD, dyslipidemia, hypertension, lumbosacral spinal stenosis and type 2 diabetes mellitus, who presented to the ER with acute onset of urinary retention over the last couple of days with associated dysuria without hematuria, nausea and vomiting without diarrhea and bilateral flank pain and tenderness.  The patient has been having cold chills but did not check her temperature.  She denied any worsening cough or wheezing or shortness of breath.  No chest pain or palpitations.   ED Course: When the patient came to the ER, vital signs were within normal and their BP was 155/79 with heart rate of 105.  Labs revealed hyponatremia 127 and hypochloremia of 88 with hyperglycemia 336 and creatinine 1.04, anion gap of 16, alk phos 127 with otherwise unremarkable CMP.  CBC showed cytosis of 11.1 with neutrophilia.  UA was positive for UTI and showed more than 500 glucose and specific gravity more than 1046. EKG as reviewed by me : None. Imaging: Abdominal and pelvic CT scan with contrast revealed possible bilateral pyelonephritis and fat-containing periumbilical hernia.   The patient was given 2 g of IV Rocephin , 10 mg of IV Reglan , 4 mg of IV morphine  sulfate and 1 L bolus of IV normal saline.  She will be admitted to a medical telemetry bed for further evaluation and management.  Assessment and Plan: * Pyelonephritis Urine culture with no growth but this was obtained after initiation of antibiotics Continue IV ceftriaxone    AUR  Patient had urinary retention on admission.  When Foley catheter was placed she reportedly had 800 cc of urine.  This is likely in the setting of urinary tract infection. - Will plan for  removal of Foley and voiding trial this p.m.   Hyponatremia resolved Status post IV fluids  Hypokalemia Replace PRN   Uncontrolled type 2 diabetes mellitus with hyperglycemia, without long-term current use of insulin  (HCC) A1c 7.6 recently.  SSI while hospitalized Hold home metformin  given received IV contrast and hold jardiance  in setting of UTI.  Chronic diastolic CHF (congestive heart failure) (HCC) - Not in exacerbation. Hold hoem jardiance  given UTI  Normocytic anemia H/o Fe Deficiency  Iron/TIBC/Ferritin/ %Sat    Component Value Date/Time   IRON 47 06/29/2024 1405   TIBC 289 06/29/2024 1405   FERRITIN 108 06/29/2024 1405   IRONPCTSAT 16 06/29/2024 1405   TSAT 16%  Will continue iron supplementation.   OSA Recent sleep study, but currently without cpap. Will start CPAP.    H/O Vit D deficiency  Resume Vit D supplementation.   Class3 obesity, morbid obesity  BMI 63         Subjective: Had some nausea and vomitng a few days prior to presenting. Had food last night and able to keep down. Having some flank pain.   Physical Exam: Vitals:   09/23/24 1617 09/23/24 1941 09/24/24 0354 09/24/24 0724  BP: 130/68 118/66 (!) 143/57 (!) 107/49  Pulse: 91 88 87 76  Resp: 16 18 18 19   Temp: 98.8 F (37.1 C) 98.2 F (36.8 C) 98.1 F (36.7 C) 98.3 F (36.8 C)  TempSrc:      SpO2: 95% 99% 92% 97%  Weight:      Height:  Physical Exam  Constitutional: In no distress.  Cardiovascular: Normal rate, regular rhythm. No lower extremity edema  Pulmonary: Non labored breathing on room air, diffuse wheezing   Abdominal: Soft. Obese, Non distended and non tender Musculoskeletal: bilateral flank pain to mild TTP Neurological: Alert and oriented to person, place, and time. Non focal  GU: foley in place with urine.  Skin: Skin is warm and dry.   Data Reviewed:     Latest Ref Rng & Units 09/24/2024    5:10 AM 09/23/2024    5:55 AM 09/22/2024   12:58 PM  BMP   Glucose 70 - 99 mg/dL 688  722  663   BUN 6 - 20 mg/dL 10  11  12    Creatinine 0.44 - 1.00 mg/dL 9.23  9.13  8.95   Sodium 135 - 145 mmol/L 135  133  127   Potassium 3.5 - 5.1 mmol/L 3.0  3.3  3.7   Chloride 98 - 111 mmol/L 98  96  88   CO2 22 - 32 mmol/L 25  25  23    Calcium  8.9 - 10.3 mg/dL 7.8  8.4  8.8       Latest Ref Rng & Units 09/24/2024    5:10 AM 09/23/2024    5:55 AM 09/22/2024   12:58 PM  CBC  WBC 4.0 - 10.5 K/uL 6.5  8.7  11.1   Hemoglobin 12.0 - 15.0 g/dL 88.6  87.5  85.7   Hematocrit 36.0 - 46.0 % 34.5  38.5  43.3   Platelets 150 - 400 K/uL 166  179  174      Family Communication: None  Disposition: Status is: Inpatient Remains inpatient appropriate because: IV antibiotics   Planned Discharge Destination: Home    Time spent: 35 minutes  Author: Alban Pepper, MD 09/24/2024 1:26 PM  For on call review [email or web address].

## 2024-09-24 NOTE — Plan of Care (Signed)

## 2024-09-24 NOTE — Progress Notes (Signed)
 Mobility Specialist - Progress Note    09/24/24 1500  Mobility  Activity Ambulated with assistance;Stood at bedside;Dangled on edge of bed  Level of Assistance Standby assist, set-up cues, supervision of patient - no hands on  Assistive Device None  Distance Ambulated (ft) 6 ft  Range of Motion/Exercises Active;All extremities  Activity Response Tolerated well  Mobility visit 1 Mobility  Mobility Specialist Start Time (ACUTE ONLY) 1441  Mobility Specialist Stop Time (ACUTE ONLY) 1452  Mobility Specialist Time Calculation (min) (ACUTE ONLY) 11 min   Pt was supine in bed with the HOB elevated on RA upon entry. Pt agreed to mobility. Pt is able today to get to the EOB independently with bed features. Pt is able to STS independently with no AD today. Pt was able to take a few steps with no AD well. After activity pt returned to the bed with needs in reach.  Clem Rodes Mobility Specialist 09/24/2024, 3:22 PM

## 2024-09-25 ENCOUNTER — Inpatient Hospital Stay

## 2024-09-25 LAB — CBC
HCT: 36.3 % (ref 36.0–46.0)
Hemoglobin: 11.8 g/dL — ABNORMAL LOW (ref 12.0–15.0)
MCH: 27.4 pg (ref 26.0–34.0)
MCHC: 32.5 g/dL (ref 30.0–36.0)
MCV: 84.2 fL (ref 80.0–100.0)
Platelets: 193 K/uL (ref 150–400)
RBC: 4.31 MIL/uL (ref 3.87–5.11)
RDW: 13.7 % (ref 11.5–15.5)
WBC: 8.3 K/uL (ref 4.0–10.5)
nRBC: 0 % (ref 0.0–0.2)

## 2024-09-25 LAB — GLUCOSE, CAPILLARY
Glucose-Capillary: 207 mg/dL — ABNORMAL HIGH (ref 70–99)
Glucose-Capillary: 151 mg/dL — ABNORMAL HIGH (ref 70–99)
Glucose-Capillary: 123 mg/dL — ABNORMAL HIGH (ref 70–99)
Glucose-Capillary: 126 mg/dL — ABNORMAL HIGH (ref 70–99)

## 2024-09-25 LAB — BASIC METABOLIC PANEL WITH GFR
Anion gap: 11 (ref 5–15)
BUN: 8 mg/dL (ref 6–20)
CO2: 26 mmol/L (ref 22–32)
Calcium: 7.7 mg/dL — ABNORMAL LOW (ref 8.9–10.3)
Chloride: 97 mmol/L — ABNORMAL LOW (ref 98–111)
Creatinine, Ser: 0.6 mg/dL (ref 0.44–1.00)
GFR, Estimated: >60 mL/min (ref >60)
Glucose, Bld: 200 mg/dL — ABNORMAL HIGH (ref 70–99)
Potassium: 2.9 mmol/L — ABNORMAL LOW (ref 3.5–5.1)
Sodium: 134 mmol/L — ABNORMAL LOW (ref 135–145)

## 2024-09-25 LAB — MAGNESIUM: Magnesium: 2.2 mg/dL (ref 1.7–2.4)

## 2024-09-25 MED ORDER — DICYCLOMINE HCL 20 MG PO TABS
20.0000 mg | ORAL_TABLET | Freq: Three times a day (TID) | ORAL | Status: DC | PRN
  Administered 2024-09-25 – 2024-09-26 (×2): 20 mg via ORAL
  Filled 2024-09-25 (×3): qty 1

## 2024-09-25 MED ORDER — IPRATROPIUM-ALBUTEROL 0.5-2.5 (3) MG/3ML IN SOLN
3.0000 mL | Freq: Once | RESPIRATORY_TRACT | Status: AC
  Administered 2024-09-25: 3 mL via RESPIRATORY_TRACT
  Filled 2024-09-25: qty 3

## 2024-09-25 MED ORDER — INSULIN ASPART 100 UNIT/ML IJ SOLN
4.0000 [IU] | Freq: Three times a day (TID) | INTRAMUSCULAR | Status: DC
  Administered 2024-09-25 – 2024-09-26 (×5): 4 [IU] via SUBCUTANEOUS
  Filled 2024-09-25 (×5): qty 4

## 2024-09-25 MED ORDER — ROPINIROLE HCL 1 MG PO TABS
1.0000 mg | ORAL_TABLET | Freq: Every day | ORAL | Status: DC
  Administered 2024-09-25: 1 mg via ORAL
  Filled 2024-09-25: qty 1

## 2024-09-25 MED ORDER — METFORMIN HCL 500 MG PO TABS
500.0000 mg | ORAL_TABLET | Freq: Two times a day (BID) | ORAL | Status: DC
  Administered 2024-09-25 – 2024-09-26 (×3): 500 mg via ORAL
  Filled 2024-09-25 (×3): qty 1

## 2024-09-25 MED ORDER — VITAMIN D 25 MCG (1000 UNIT) PO TABS
500.0000 [IU] | ORAL_TABLET | Freq: Every day | ORAL | Status: DC
  Administered 2024-09-25 – 2024-09-26 (×2): 500 [IU] via ORAL
  Filled 2024-09-25 (×2): qty 1

## 2024-09-25 MED ORDER — TIZANIDINE HCL 4 MG PO TABS: 4.0000 mg | ORAL_TABLET | Freq: Every evening | ORAL | Status: DC | PRN

## 2024-09-25 MED ORDER — FOLIC ACID 1 MG PO TABS
1.0000 mg | ORAL_TABLET | Freq: Every day | ORAL | Status: DC
  Administered 2024-09-25 – 2024-09-26 (×2): 1 mg via ORAL
  Filled 2024-09-25 (×2): qty 1

## 2024-09-25 MED ORDER — VITAMIN B-12 1000 MCG PO TABS
1000.0000 ug | ORAL_TABLET | Freq: Every day | ORAL | Status: DC
  Administered 2024-09-25 – 2024-09-26 (×2): 1000 ug via ORAL
  Filled 2024-09-25 (×2): qty 1

## 2024-09-25 MED ORDER — SULFAMETHOXAZOLE-TRIMETHOPRIM 800-160 MG PO TABS
1.0000 | ORAL_TABLET | Freq: Two times a day (BID) | ORAL | Status: DC
  Administered 2024-09-25 – 2024-09-26 (×2): 1 via ORAL
  Filled 2024-09-25 (×2): qty 1

## 2024-09-25 MED ORDER — IOHEXOL 300 MG/ML  SOLN
100.0000 mL | Freq: Once | INTRAMUSCULAR | Status: AC | PRN
  Administered 2024-09-25: 100 mL via INTRAVENOUS

## 2024-09-25 MED ORDER — POTASSIUM CHLORIDE CRYS ER 20 MEQ PO TBCR
40.0000 meq | EXTENDED_RELEASE_TABLET | ORAL | Status: AC
  Administered 2024-09-25 (×2): 40 meq via ORAL
  Filled 2024-09-25 (×2): qty 2

## 2024-09-25 MED ORDER — INSULIN GLARGINE 100 UNIT/ML ~~LOC~~ SOLN
13.0000 [IU] | Freq: Every day | SUBCUTANEOUS | Status: DC
  Administered 2024-09-25 – 2024-09-26 (×2): 13 [IU] via SUBCUTANEOUS
  Filled 2024-09-25 (×2): qty 0.13

## 2024-09-25 NOTE — Progress Notes (Signed)
 PT Screen Note  Patient Details Name: Jasmine Larsen MRN: [id number] DOB: July 04, 1966   Treatment/screen:    Pt laying in bed on arrival and reports that she has been up and moving in the room and feeling her normal self.  She was eager to eat her breakfast but willing to get up and do some ambulation in the room to show that she does not need further PT intervention. She was able to get herself up to sitting EOB w/o assist or issue, stood from low bed setting w/o assist and ambulated ~50 ft in the room w/o AD. Pt reports she feels essentially at her baseline and does not normally do much more walking than this at one time any way.  She endorses that family is there to help, daughter runs errands and that she does not need or want continued PT here or at discharge.  Pt did c/o abdominal pain during our session but did not voice nor display any overt PT needs, we will sign off.    Carmin [name], DPT 09/25/2024, 10:41 AM

## 2024-09-25 NOTE — Progress Notes (Addendum)
 Progress Note   Patient: Jasmine Larsen FMW:[id number] DOB: Jul 29, 1966 DOA: 09/22/2024     3 DOS: the patient was seen and examined on 09/25/2024   Brief hospital course: H&P HPI  KIYARA BOUFFARD is a 58 y.o. Caucasian female with medical history significant for diastolic CHF, asthma, COPD, DDD, dyslipidemia, hypertension, lumbosacral spinal stenosis and type 2 diabetes mellitus, who presented to the ER with acute onset of urinary retention over the last couple of days with associated dysuria without hematuria, nausea and vomiting without diarrhea and bilateral flank pain and tenderness.  The patient has been having cold chills but did not check her temperature.  She denied any worsening cough or wheezing or shortness of breath.  No chest pain or palpitations.   ED Course: When the patient came to the ER, vital signs were within normal and their BP was 155/79 with heart rate of 105.  Labs revealed hyponatremia 127 and hypochloremia of 88 with hyperglycemia 336 and creatinine 1.04, anion gap of 16, alk phos 127 with otherwise unremarkable CMP.  CBC showed cytosis of 11.1 with neutrophilia.  UA was positive for UTI and showed more than 500 glucose and specific gravity more than 1046. EKG as reviewed by me : None. Imaging: Abdominal and pelvic CT scan with contrast revealed possible bilateral pyelonephritis and fat-containing periumbilical hernia.   The patient was given 2 g of IV Rocephin , 10 mg of IV Reglan , 4 mg of IV morphine  sulfate and 1 L bolus of IV normal saline.  She will be admitted to a medical telemetry bed for further evaluation and management.  Assessment and Plan: * Pyelonephritis Urine culture with no growth but this was obtained after initiation of antibiotics Patient with persistent bilateral flank pain that is minimally improved from admission, will repeat CT abdomen pelvis to look for abscess Patient will transition to Bactrim  to complete a 10-day course of antibiotics if CT  abdomen pelvis is stable  AUR resolved Patient had urinary retention on admission.  When Foley catheter was placed she reportedly had 800 cc of urine.  This is likely in the setting of urinary tract infection.  Foley was moved 12/13 and patient has been able to void without difficulty.   Hyponatremia  Mild this a.m. Continue to monitor  Hypokalemia Replace PRN   Uncontrolled type 2 diabetes mellitus with hyperglycemia, without long-term current use of insulin  (HCC) A1c 7.6 recently.  CBG (last 3)  Recent Labs    09/24/24 2000 09/25/24 0732 09/25/24 1132  GLUCAP 165* 207* 151*  Increase Lantus  to 13 units from 10 units also added 4 units of mealtime insulin .  Patient would not like to be discharged on insulin , discussed with her that she will need to be very conscientious about her diet as she will not be discharged on Jardiance  due to urinary tract infection.  Hold home metformin  given received IV contrast and hold jardiance  in setting of UTI.  Chronic diastolic CHF (congestive heart failure) (HCC) - Not in exacerbation. Hold hoem jardiance  given UTI  Normocytic anemia H/o Fe Deficiency  Folate deficiency  Iron/TIBC/Ferritin/ %Sat    Component Value Date/Time   IRON 47 06/29/2024 1405   TIBC 289 06/29/2024 1405   FERRITIN 108 06/29/2024 1405   IRONPCTSAT 16 06/29/2024 1405   TSAT 16%  Folate 3.4 8 mo ago B12 430 Will continue iron supplementation.  Continue B12 supplementation Start folate  OSA Recent sleep study, but currently without cpap.  Continue CPAP at night  H/O Vit D deficiency  Resume Vit D supplementation.   Class3 obesity, morbid obesity  BMI 63  Complicates care        Subjective: eATING AND DRINKING OKAY. aBLE TO URINATE AFTER FOLEY REMOVED WITHOUT DIFICULTY. hAVING BILATERAL FLANK PAIN STILL THAT IS ONLY MINIMALLY IMPROVED.   Physical Exam: Vitals:   09/24/24 0724 09/24/24 1645 09/24/24 2001 09/25/24 0733  BP: (!) 107/49 100/67  111/66 134/73  Pulse: 76 78 79 88  Resp: 19 19 18 16   Temp: 98.3 F (36.8 C) 98.3 F (36.8 C) 98.1 F (36.7 C) 97.9 F (36.6 C)  TempSrc:      SpO2: 97% 97% 97% 95%  Weight:      Height:        Constitutional: In no distress.  Cardiovascular: Normal rate, regular rhythm. No lower extremity edema  Pulmonary: Non labored breathing on room air, DIFFUSE WHEEZING  Abdominal: Soft. Non distended and non tender Musculoskeletal: bILATERAL FLANK PAIN TO MILD PALPATION  Neurological: Alert and oriented to person, place, and time. Non focal  Skin: Skin is warm and dry.    Data Reviewed:     Latest Ref Rng & Units 09/25/2024    6:13 AM 09/24/2024    5:10 AM 09/23/2024    5:55 AM  BMP  Glucose 70 - 99 mg/dL 799  688  722   BUN 6 - 20 mg/dL 8  10  11    Creatinine 0.44 - 1.00 mg/dL 9.39  9.23  9.13   Sodium 135 - 145 mmol/L 134  135  133   Potassium 3.5 - 5.1 mmol/L 2.9  3.0  3.3   Chloride 98 - 111 mmol/L 97  98  96   CO2 22 - 32 mmol/L 26  25  25    Calcium  8.9 - 10.3 mg/dL 7.7  7.8  8.4       Latest Ref Rng & Units 09/25/2024    6:13 AM 09/24/2024    5:10 AM 09/23/2024    5:55 AM  CBC  WBC 4.0 - 10.5 K/uL 8.3  6.5  8.7   Hemoglobin 12.0 - 15.0 g/dL 88.1  88.6  87.5   Hematocrit 36.0 - 46.0 % 36.3  34.5  38.5   Platelets 150 - 400 K/uL 193  166  179      Family Communication: None  Disposition: Status is: Inpatient Remains inpatient appropriate because: IV antibiotics   Planned Discharge Destination: Home    Time spent: 35 minutes  Author: Alban Pepper, MD 09/25/2024 8:20 AM  For on call review [email or web address]

## 2024-09-25 NOTE — Plan of Care (Signed)

## 2024-09-26 ENCOUNTER — Other Ambulatory Visit: Payer: Self-pay

## 2024-09-26 DIAGNOSIS — N12 Tubulo-interstitial nephritis, not specified as acute or chronic: Secondary | ICD-10-CM | POA: Diagnosis not present

## 2024-09-26 LAB — VITAMIN D 25 HYDROXY (VIT D DEFICIENCY, FRACTURES): Vit D, 25-Hydroxy: 26.72 ng/mL — ABNORMAL LOW (ref 30–100)

## 2024-09-26 LAB — CBC
HCT: 36.9 % (ref 36.0–46.0)
Hemoglobin: 12 g/dL (ref 12.0–15.0)
MCH: 27.3 pg (ref 26.0–34.0)
MCHC: 32.5 g/dL (ref 30.0–36.0)
MCV: 84.1 fL (ref 80.0–100.0)
Platelets: 239 K/uL (ref 150–400)
RBC: 4.39 MIL/uL (ref 3.87–5.11)
RDW: 13.9 % (ref 11.5–15.5)
WBC: 8.4 K/uL (ref 4.0–10.5)
nRBC: 0 % (ref 0.0–0.2)

## 2024-09-26 LAB — BASIC METABOLIC PANEL WITH GFR
Anion gap: 10 (ref 5–15)
BUN: 10 mg/dL (ref 6–20)
CO2: 24 mmol/L (ref 22–32)
Calcium: 8.2 mg/dL — ABNORMAL LOW (ref 8.9–10.3)
Chloride: 101 mmol/L (ref 98–111)
Creatinine, Ser: 0.72 mg/dL (ref 0.44–1.00)
GFR, Estimated: >60 mL/min (ref >60)
Glucose, Bld: 208 mg/dL — ABNORMAL HIGH (ref 70–99)
Potassium: 3.5 mmol/L (ref 3.5–5.1)
Sodium: 136 mmol/L (ref 135–145)

## 2024-09-26 LAB — GLUCOSE, CAPILLARY
Glucose-Capillary: 208 mg/dL — ABNORMAL HIGH (ref 70–99)
Glucose-Capillary: 157 mg/dL — ABNORMAL HIGH (ref 70–99)

## 2024-09-26 LAB — VITAMIN B12: Vitamin B-12: 329 pg/mL (ref 180–914)

## 2024-09-26 LAB — FOLATE: Folate: 8.2 ng/mL (ref >5.9)

## 2024-09-26 LAB — MAGNESIUM: Magnesium: 2.3 mg/dL (ref 1.7–2.4)

## 2024-09-26 MED ORDER — ACETAMINOPHEN 325 MG PO TABS: 650.0000 mg | ORAL_TABLET | Freq: Four times a day (QID) | ORAL | Status: AC | PRN

## 2024-09-26 MED ORDER — FERROUS SULFATE 325 (65 FE) MG PO TABS
325.0000 mg | ORAL_TABLET | Freq: Every day | ORAL | 3 refills | Status: DC
  Filled 2024-09-26: qty 30, 30d supply, fill #0

## 2024-09-26 MED ORDER — OXYCODONE HCL 5 MG PO TABS
5.0000 mg | ORAL_TABLET | Freq: Three times a day (TID) | ORAL | 0 refills | Status: DC | PRN
  Filled 2024-09-26: qty 10, 4d supply, fill #0

## 2024-09-26 MED ORDER — METFORMIN HCL 500 MG PO TABS: 1000.0000 mg | ORAL_TABLET | Freq: Two times a day (BID) | ORAL | Status: DC

## 2024-09-26 MED ORDER — VITAMIN D (ERGOCALCIFEROL) 1.25 MG (50000 UNIT) PO CAPS
50000.0000 [IU] | ORAL_CAPSULE | ORAL | 0 refills | Status: AC
  Filled 2024-09-26: qty 8, 56d supply, fill #0

## 2024-09-26 MED ORDER — SULFAMETHOXAZOLE-TRIMETHOPRIM 800-160 MG PO TABS
1.0000 | ORAL_TABLET | Freq: Two times a day (BID) | ORAL | 0 refills | Status: AC
  Filled 2024-09-26: qty 13, 7d supply, fill #0

## 2024-09-26 NOTE — Progress Notes (Deleted)
°  Start: *** end: ***  Patient is here today ***. Patient would like to learn ***. Patient lives with ***.  *** shopping and cooking. Pt reports eating out *** times weekly.  Pt reports making the following changes including ***.  All Pt's questions were answered during this encounter.    History includes:  *** Medications include:  *** Labs noted:  ***  Osteo, 7.6%, basal bolus, jardiance , met, ozempic

## 2024-09-26 NOTE — Discharge Instructions (Signed)
 Please ensure that you stay hydrated, and avoid sugary drinks and foods.  You are on 1 less diabetes medication because of your urinary tract infection.  Your metformin  has been increased to 2 tablets or 1000 mg twice a day.   Please take your antibiotic as prescribed.  Please follow-up with your primary care doctor within the week to get your diabetes medications changed.  Please take your vitamin B12, vitamin D , and iron.  While you are taking these I would also use a stool softener as iron can be constipating

## 2024-09-26 NOTE — Plan of Care (Signed)

## 2024-09-26 NOTE — Care Management Important Message (Signed)
 Important Message  Patient Details  Name: Jasmine Larsen MRN: [id number] Date of Birth: 1965-12-07   Important Message Given:  Yes - Medicare IM     Guneet Delpino W, CMA 09/26/2024, 10:54 AM

## 2024-09-26 NOTE — Plan of Care (Signed)
°  Problem: Nutritional: Goal: Maintenance of adequate nutrition will improve Outcome: Progressing   Problem: Metabolic: Goal: Ability to maintain appropriate glucose levels will improve Outcome: Progressing   Problem: Education: Goal: Ability to describe self-care measures that may prevent or decrease complications (Diabetes Survival Skills Education) will improve Outcome: Progressing   Problem: Safety: Goal: Ability to remain free from injury will improve Outcome: Progressing   Problem: Skin Integrity: Goal: Risk for impaired skin integrity will decrease Outcome: Progressing

## 2024-09-26 NOTE — Progress Notes (Signed)
D/C AVS completed and reviewed with pt. All opportunities for questions answered and clarified. IV removed. Pt will be wheeled down to car at medical mall entrance via wheelchair.

## 2024-09-27 ENCOUNTER — Telehealth: Payer: Self-pay

## 2024-09-27 NOTE — Discharge Summary (Signed)
 Physician Discharge Summary   Patient: Jasmine Larsen MRN: [id number] DOB: 11/01/1965  Admit date:     09/22/2024  Discharge date: 09/26/2024  Discharge Physician: Alban Pepper   PCP: [name], MD   Recommendations at discharge:    Further titration of diabetes regimen, SGLTi held given UTI Needs CPAP   Discharge Diagnoses: Principal Problem:   Pyelonephritis Active Problems:   Hyponatremia   Uncontrolled type 2 diabetes mellitus with hyperglycemia, without long-term current use of insulin  (HCC)   Chronic diastolic CHF (congestive heart failure) (HCC)  Resolved Problems:   * No resolved hospital problems. [facility] Course: H&P HPI  Jasmine Larsen is a 58 y.o. Caucasian female with medical history significant for diastolic CHF, asthma, COPD, DDD, dyslipidemia, hypertension, lumbosacral spinal stenosis and type 2 diabetes mellitus, who presented to the ER with acute onset of urinary retention over the last couple of days with associated dysuria without hematuria, nausea and vomiting without diarrhea and bilateral flank pain and tenderness.  The patient has been having cold chills but did not check her temperature.  She denied any worsening cough or wheezing or shortness of breath.  No chest pain or palpitations.   ED Course: When the patient came to the ER, vital signs were within normal and their BP was 155/79 with heart rate of 105.  Labs revealed hyponatremia 127 and hypochloremia of 88 with hyperglycemia 336 and creatinine 1.04, anion gap of 16, alk phos 127 with otherwise unremarkable CMP.  CBC showed cytosis of 11.1 with neutrophilia.  UA was positive for UTI and showed more than 500 glucose and specific gravity more than 1046 Imaging: Abdominal and pelvic CT scan with contrast revealed possible bilateral pyelonephritis and fat-containing periumbilical hernia.   The patient was given 2 g of IV Rocephin , 10 mg of IV Reglan , 4 mg of IV morphine  sulfate and 1 L bolus of  IV normal saline.  She will be admitted to a medical telemetry bed for further evaluation and management.  Assessment and Plan: * Pyelonephritis Urine culture with no growth but this was obtained after initiation of antibiotics Patient with persistent bilateral flank pain that improved some prior to discharge, repeat CT abdomen pelvis was obtained which showed no abscess or worsening of previous findings that were c/f pyelonephritis.  Patient will complete Bactrim  for a total 10-day course of antibiotics    AUR resolved Patient had urinary retention on admission.  When Foley catheter was placed she reportedly had 800 cc of urine.  This is likely in the setting of urinary tract infection.  Foley was moved 12/13 and patient has been able to void without difficulty.     Hyponatremia     Latest Ref Rng & Units 09/26/2024    5:51 AM 09/25/2024    6:13 AM 09/24/2024    5:10 AM  BMP  Glucose 70 - 99 mg/dL 791  799  688   BUN 6 - 20 mg/dL 10  8  10    Creatinine 0.44 - 1.00 mg/dL 9.27  9.39  9.23   Sodium 135 - 145 mmol/L 136  134  135   Potassium 3.5 - 5.1 mmol/L 3.5  2.9  3.0   Chloride 98 - 111 mmol/L 101  97  98   CO2 22 - 32 mmol/L 24  26  25    Calcium  8.9 - 10.3 mg/dL 8.2  7.7  7.8    Resolved by discharge.    Hypokalemia Replaced PRN  Uncontrolled type 2 diabetes mellitus with hyperglycemia, without long-term current use of insulin  (HCC) A1c 7.6 recently.  CBG (last 3)  Recent Labs (last 2 labs)       Recent Labs    09/24/24 2000 09/25/24 0732 09/25/24 1132  GLUCAP 165* 207* 151*    Requiring up to lantus  13u and mealtime insulin  as well. Patients jardiance  and metformin  were held. She will discharge on metformin  at increased dose and her home GLP1 agonist. Discussed with patient importance of decreasing added sugars in her diet. She will also have close follow up with her PCP for further titration of her diabetes regimen. She does not like injections and did not want to  start insulin  on discharge.     Chronic diastolic CHF (congestive heart failure) (HCC) - Not in exacerbation. Held home jardiance  d/t UTI. She will discuss with her PCP when/If to resume this.    Normocytic anemia H/o Fe Deficiency  Folate deficiency  Iron/TIBC/Ferritin/ %Sat Labs (Brief)          Component Value Date/Time    IRON 47 06/29/2024 1405    TIBC 289 06/29/2024 1405    FERRITIN 108 06/29/2024 1405    IRONPCTSAT 16 06/29/2024 1405      TSAT 16%  Folate 3.4 8 mo ago B12 430 Patient discharged on folate, b12, and iron.    OSA Recent sleep study, but currently without cpap.  Continue CPAP at night     H/O Vit D deficiency  Continued on Vit D supplementation. Will need this checked as appropriate with her PCP.      Class3 obesity, morbid obesity  BMI 63  Complicates care        Consultants: None Procedures performed: None  Disposition: Home Diet recommendation:  Discharge Diet Orders (From admission, onward)     Start     Ordered   09/26/24 0000  Diet Carb Modified        09/26/24 1347           Carb modified diet DISCHARGE MEDICATION: Allergies as of 09/26/2024       Reactions   Cymbalta  [duloxetine  Hcl]    SI thoughts        Medication List     PAUSE taking these medications    empagliflozin  10 MG Tabs tablet Wait to take this until your doctor or other care provider tells you to start again. Commonly known as: Jardiance  Take 1 tablet (10 mg total) by mouth daily before breakfast.       TAKE these medications    rOPINIRole  1 MG tablet Commonly known as: REQUIP  Take 1 tablet (1 mg total) by mouth at bedtime. The timing of this medication is very important.   Accu-Chek Guide Test test strip Generic drug: glucose blood Use as instructed   Accu-Chek Softclix Lancets lancets 1 each by Other route daily.   acetaminophen  325 MG tablet Commonly known as: TYLENOL  Take 2 tablets (650 mg total) by mouth every 6 (six) hours  as needed for mild pain (pain score 1-3) or fever (or Fever >/= 101).   albuterol  108 (90 Base) MCG/ACT inhaler Commonly known as: VENTOLIN  HFA Inhale 1-2 puffs into the lungs every 6 (six) hours as needed for wheezing or shortness of breath.   albuterol  (2.5 MG/3ML) 0.083% nebulizer solution Commonly known as: PROVENTIL  Take 3 mLs (2.5 mg total) by nebulization every 6 (six) hours as needed for wheezing or shortness of breath.   benzonatate  200 MG capsule Commonly  known as: TESSALON  Take 1 capsule (200 mg total) by mouth 2 (two) times daily as needed for cough.   blood glucose meter kit and supplies Kit Use up to 4 times daily.   Blood Glucose Monitoring Suppl Devi 1 each by Does not apply route as directed. May substitute to any manufacturer covered by patient's insurance.   cyanocobalamin  1000 MCG tablet Commonly known as: VITAMIN B12 Take 1 tablet (1,000 mcg total) by mouth daily.   dicyclomine  20 MG tablet Commonly known as: BENTYL  Take 1 tablet (20 mg total) by mouth 3 (three) times daily as needed for spasms.   FeroSul 325 (65 Fe) MG tablet Generic drug: ferrous sulfate  Take 1 tablet (325 mg total) by mouth daily with breakfast.   ipratropium-albuterol  0.5-2.5 (3) MG/3ML Soln Commonly known as: DUONEB Take 3 mLs by nebulization every 6 (six) hours as needed.   metFORMIN  500 MG tablet Commonly known as: GLUCOPHAGE  Take 2 tablets (1,000 mg total) by mouth 2 (two) times daily with a meal. What changed: how much to take   ondansetron  4 MG disintegrating tablet Commonly known as: ZOFRAN -ODT Take 1 tablet (4 mg total) by mouth daily as needed for nausea or vomiting.   oxyCODONE  5 MG immediate release tablet Commonly known as: Roxicodone  Take 1 tablet (5 mg total) by mouth every 8 (eight) hours as needed for up to 5 days for severe pain (pain score 7-10).   Ozempic  (2 MG/DOSE) 8 MG/3ML Sopn Generic drug: Semaglutide  (2 MG/DOSE) Inject 2 mg into the skin once a  week.   rosuvastatin  20 MG tablet Commonly known as: Crestor  Take 1 tablet (20 mg total) by mouth at bedtime.   sulfamethoxazole -trimethoprim  800-160 MG tablet Commonly known as: BACTRIM  DS Take 1 tablet by mouth every 12 (twelve) hours for 13 doses.   tiZANidine  4 MG tablet Commonly known as: ZANAFLEX  Take 1 tablet (4 mg total) by mouth at bedtime as needed for muscle spasms.   Trelegy Ellipta  100-62.5-25 MCG/ACT Aepb Generic drug: Fluticasone -Umeclidin-Vilant Inhale 1 puff into the lungs daily.   Vitamin D  (Ergocalciferol ) 1.25 MG (50000 UNIT) Caps capsule Commonly known as: DRISDOL  Take 1 capsule (50,000 Units total) by mouth every 7 (seven) days for 8 doses.        Follow-up Information     Bair, Luke, MD. Go on 10/07/2024.   Specialty: Family Medicine Why: Appt @ 10:40 am w/ Charlott Glance, NP Contact information: [address] [place] [address] [phone]                Discharge Exam: Fredricka Weights   09/22/24 1256  Weight: (!) 157.4 kg   Physical Exam  Constitutional: In no distress.  Cardiovascular: Normal rate, regular rhythm. Trace bilateral lower extremity edema  Pulmonary: Non labored breathing on room air, no wheezing or rales.   Abdominal: Soft. Obese. Non distended and non tender Musculoskeletal: Mild TTP of bilateral flank  Neurological: Alert and oriented to person, place, and time. Non focal  Skin: Skin is warm and dry.    Condition at discharge: improving  The results of significant diagnostics from this hospitalization (including imaging, microbiology, ancillary and laboratory) are listed below for reference.   Imaging Studies: CT ABDOMEN PELVIS W CONTRAST Result Date: 09/25/2024 CLINICAL DATA:  Pyelonephritis suspected.  Urinary retention. EXAM: CT ABDOMEN AND PELVIS WITH CONTRAST TECHNIQUE: Multidetector CT imaging of the abdomen and pelvis was performed using the standard protocol following bolus administration of  intravenous contrast. RADIATION DOSE REDUCTION: This exam  was performed according to the departmental dose-optimization program which includes automated exposure control, adjustment of the mA and/or kV according to patient size and/or use of iterative reconstruction technique. CONTRAST:  OMNIPAQUE  IOHEXOL  300 MG/ML  SOLN COMPARISON:  09/22/2024. FINDINGS: Lower chest: Atelectasis is noted in the left lower lobe. Hepatobiliary: No focal liver abnormality is seen. No biliary ductal dilatation. A small stone is noted in the gallbladder. Pancreas: Unremarkable. No pancreatic ductal dilatation or surrounding inflammatory changes. Spleen: Normal in size without focal abnormality. Adrenals/Urinary Tract: The adrenal glands are within normal limits. The kidneys enhance symmetrically. There is a suspected mild patchy hypoenhancement involving the kidneys bilaterally, not significantly changed from the prior exam. No renal calculus or hydronephrosis bilaterally. The bladder is decompressed. Stomach/Bowel: The stomach is within normal limits. No bowel obstruction, free air, or pneumatosis is seen. Appendix appears normal. A few scattered diverticula are present along the colon without evidence of diverticulitis. Vascular/Lymphatic: Aortic atherosclerosis. No enlarged abdominal or pelvic lymph nodes. Reproductive: Uterus and bilateral adnexa are unremarkable. Other: Small amount of free fluid is noted in the pelvis. There is a fat containing paraumbilical hernia. Musculoskeletal: Mild degenerative changes are present in the lumbar spine. No acute osseous abnormality. IMPRESSION: 1. Suspected mild patchy hypoenhancement in the kidneys bilaterally, not significantly changed from the prior exam which may be associated with pyelonephritis. 2. Cholelithiasis. 3. Fat containing periumbilical hernia. Electronically Signed   By: Leita Birmingham M.D.   On: 09/25/2024 15:32   CT ABDOMEN PELVIS W CONTRAST Result Date:  09/22/2024 EXAM: CT ABDOMEN AND PELVIS WITH CONTRAST 09/22/2024 03:42:50 PM TECHNIQUE: CT of the abdomen and pelvis was performed with the administration of 100 mL of iohexol  (OMNIPAQUE ) 350 MG/ML injection. Multiplanar reformatted images are provided for review. Automated exposure control, iterative reconstruction, and/or weight-based adjustment of the mA/kV was utilized to reduce the radiation dose to as low as reasonably achievable. COMPARISON: None available. CLINICAL HISTORY: UTI, recurrent/complicated (Female). FINDINGS: LOWER CHEST: No acute abnormality. LIVER: The liver is unremarkable. GALLBLADDER AND BILE DUCTS: Gallbladder is unremarkable. No biliary ductal dilatation. SPLEEN: No acute abnormality. PANCREAS: No acute abnormality. ADRENAL GLANDS: No acute abnormality. KIDNEYS, URETERS AND BLADDER: Multiple areas of hypodensity involving both kidneys on the delayed post-contrast imaging suggesting possible bilateral pyelonephritis. No stones in the kidneys or ureters. No hydronephrosis. No perinephric or periureteral stranding. Urinary bladder is unremarkable. GI AND BOWEL: Stomach demonstrates no acute abnormality. There is no bowel obstruction. PERITONEUM AND RETROPERITONEUM: Fat-containing periumbilical hernia. No ascites. No free air. VASCULATURE: Aorta is normal in caliber. LYMPH NODES: No lymphadenopathy. REPRODUCTIVE ORGANS: No acute abnormality. BONES AND SOFT TISSUES: No acute osseous abnormality. No focal soft tissue abnormality. IMPRESSION: 1. Possible bilateral pyelonephritis. 2. Fat-containing periumbilical hernia. Electronically signed by: Lynwood Seip MD 09/22/2024 04:08 PM EST RP Workstation: [id number]   Sleep Study Documents Result Date: 09/16/2024 Ordered by an unspecified provider.  DG Knee Complete 4 Views Left Result Date: 09/09/2024 CLINICAL DATA:  Bilateral chronic knee pain. EXAM: DG KNEE COMPLETE 4+V*L* COMPARISON:  None Available. FINDINGS: Moderate joint space narrowing in  the medial knee compartment. Diffuse osteophytosis in the knee compartments. Negative for fracture or dislocation. No definite joint effusion. IMPRESSION: Left knee osteoarthritis.  No acute bone abnormality. Electronically Signed   By: Juliene Balder M.D.   On: 09/09/2024 08:19   DG Knee Complete 4 Views Right Result Date: 09/09/2024 CLINICAL DATA:  Bilateral chronic knee pain. EXAM: DG KNEE COMPLETE 4+V*R* COMPARISON:  None Available. FINDINGS: Moderate joint  space narrowing in the medial knee compartment. Osteophytosis in the knee compartments most prominent the patellofemoral compartment. Negative for acute fracture or dislocation. No definite joint effusion. IMPRESSION: 1. No acute bone abnormality to the right knee. 2. Degenerative changes in the right knee. Electronically Signed   By: Juliene Balder M.D.   On: 09/09/2024 08:17   DG Lumbar Spine Complete W/Bend Result Date: 09/09/2024 CLINICAL DATA:  Chronic bilateral low back pain without sciatica. EXAM: LUMBAR SPINE - COMPLETE WITH BENDING VIEWS COMPARISON:  MRI 11/18/2023 FINDINGS: Normal alignment in lumbar spine. Vertebral body heights and disc spaces are maintained. Mild degenerative endplate changes. Normal alignment at the thoracolumbar junction. IMPRESSION: Mild degenerative changes in lumbar spine. No acute findings. Electronically Signed   By: Juliene Balder M.D.   On: 09/09/2024 08:14   Sleep Study Documents Result Date: 08/31/2024 Ordered by an unspecified provider.   Microbiology: Results for orders placed or performed during the hospital encounter of 09/22/24  Urine Culture (for pregnant, neutropenic or urologic patients or patients with an indwelling urinary catheter)     Status: None   Collection Time: 09/23/24 11:32 AM   Specimen: Urine, Catheterized  Result Value Ref Range Status   Specimen Description   Final    URINE, CATHETERIZED Performed at [facility], [address], [place], [place] [address]    Special  Requests   Final    NONE Performed at [facility], [address] [place], [place] [address]    Culture   Final    NO GROWTH Performed at [facility], [address] [place], [place] [address]    Report Status 09/24/2024 FINAL  Final    Labs: CBC: Recent Labs  Lab 09/22/24 1258 09/23/24 0555 09/24/24 0510 09/25/24 0613 09/26/24 0551  WBC 11.1* 8.7 6.5 8.3 8.4  NEUTROABS 8.8*  --  4.3  --   --   HGB 14.2 12.4 11.3* 11.8* 12.0  HCT 43.3 38.5 34.5* 36.3 36.9  MCV 84.1 84.6 84.4 84.2 84.1  PLT 174 179 166 193 239   Basic Metabolic Panel: Recent Labs  Lab 09/22/24 1258 09/23/24 0555 09/24/24 0509 09/24/24 0510 09/25/24 0613 09/26/24 0551  NA 127* 133*  --  135 134* 136  K 3.7 3.3*  --  3.0* 2.9* 3.5  CL 88* 96*  --  98 97* 101  CO2 23 25  --  25 26 24   GLUCOSE 336* 277*  --  311* 200* 208*  BUN 12 11  --  10 8 10   CREATININE 1.04* 0.86  --  0.76 0.60 0.72  CALCIUM  8.8* 8.4*  --  7.8* 7.7* 8.2*  MG  --   --  2.1  --  2.2 2.3   Liver Function Tests: Recent Labs  Lab 09/22/24 1258  AST 25  ALT 25  ALKPHOS 127*  BILITOT 1.0  PROT 7.7  ALBUMIN 3.6   CBG: Recent Labs  Lab 09/25/24 1132 09/25/24 1716 09/25/24 2134 09/26/24 0721 09/26/24 1140  GLUCAP 151* 123* 126* 208* 157*    Discharge time spent: greater than 30 minutes.  Signed: Alban Pepper, MD [facility] 09/27/2024

## 2024-09-27 NOTE — Transitions of Care (Post Inpatient/ED Visit) (Signed)
° °  09/27/2024  Name: Jasmine Larsen MRN: [id number] DOB: Dec 10, 1965  Today's TOC FU Call Status: Today's TOC FU Call Status:: Unsuccessful Call (1st Attempt) Unsuccessful Call (1st Attempt) Date: 09/27/24  Attempted to reach the patient regarding the most recent Inpatient/ED visit.  Follow Up Plan: Additional outreach attempts will be made to reach the patient to complete the Transitions of Care (Post Inpatient/ED visit) call.   Arvin Seip RN, BSN, CCM [employer], Population Health Case Manager Phone: [phone]

## 2024-09-28 ENCOUNTER — Telehealth: Payer: Self-pay

## 2024-09-28 NOTE — Transitions of Care (Post Inpatient/ED Visit) (Signed)
° °  09/28/2024  Name: Jasmine Larsen MRN: [id number] DOB: 1966-08-23  Today's TOC FU Call Status: Today's TOC FU Call Status:: Unsuccessful Call (2nd Attempt) Unsuccessful Call (2nd Attempt) Date: 09/28/24  Attempted to reach the patient regarding the most recent Inpatient/ED visit.  Follow Up Plan: Additional outreach attempts will be made to reach the patient to complete the Transitions of Care (Post Inpatient/ED visit) call.   Arvin Seip RN, BSN, CCM [employer], Population Health Case Manager Phone: [phone]

## 2024-09-29 ENCOUNTER — Telehealth: Payer: Self-pay

## 2024-09-29 ENCOUNTER — Ambulatory Visit

## 2024-09-29 ENCOUNTER — Other Ambulatory Visit

## 2024-09-29 ENCOUNTER — Ambulatory Visit: Payer: Self-pay

## 2024-09-29 VITALS — BP 128/80 | HR 80 | Temp 97.9°F | Ht 62.0 in | Wt 356.8 lb

## 2024-09-29 DIAGNOSIS — E118 Type 2 diabetes mellitus with unspecified complications: Secondary | ICD-10-CM

## 2024-09-29 DIAGNOSIS — E1159 Type 2 diabetes mellitus with other circulatory complications: Secondary | ICD-10-CM | POA: Insufficient documentation

## 2024-09-29 DIAGNOSIS — J410 Simple chronic bronchitis: Secondary | ICD-10-CM | POA: Insufficient documentation

## 2024-09-29 DIAGNOSIS — E785 Hyperlipidemia, unspecified: Secondary | ICD-10-CM | POA: Diagnosis not present

## 2024-09-29 DIAGNOSIS — E559 Vitamin D deficiency, unspecified: Secondary | ICD-10-CM

## 2024-09-29 DIAGNOSIS — Z720 Tobacco use: Secondary | ICD-10-CM

## 2024-09-29 DIAGNOSIS — G4733 Obstructive sleep apnea (adult) (pediatric): Secondary | ICD-10-CM | POA: Insufficient documentation

## 2024-09-29 DIAGNOSIS — E66813 Obesity, class 3: Secondary | ICD-10-CM

## 2024-09-29 DIAGNOSIS — K8020 Calculus of gallbladder without cholecystitis without obstruction: Secondary | ICD-10-CM | POA: Insufficient documentation

## 2024-09-29 DIAGNOSIS — E1169 Type 2 diabetes mellitus with other specified complication: Secondary | ICD-10-CM | POA: Diagnosis not present

## 2024-09-29 DIAGNOSIS — R110 Nausea: Secondary | ICD-10-CM

## 2024-09-29 DIAGNOSIS — G2581 Restless legs syndrome: Secondary | ICD-10-CM

## 2024-09-29 DIAGNOSIS — G8929 Other chronic pain: Secondary | ICD-10-CM

## 2024-09-29 DIAGNOSIS — Z09 Encounter for follow-up examination after completed treatment for conditions other than malignant neoplasm: Secondary | ICD-10-CM | POA: Insufficient documentation

## 2024-09-29 LAB — COMPREHENSIVE METABOLIC PANEL WITH GFR
ALT: 30 U/L (ref 3–35)
AST: 19 U/L (ref 5–37)
Albumin: 3.5 g/dL (ref 3.5–5.2)
Alkaline Phosphatase: 98 U/L (ref 39–117)
BUN: 10 mg/dL (ref 6–23)
CO2: 28 meq/L (ref 19–32)
Calcium: 8.4 mg/dL (ref 8.4–10.5)
Chloride: 102 meq/L (ref 96–112)
Creatinine, Ser: 0.64 mg/dL (ref 0.40–1.20)
GFR: 98.6 mL/min (ref >60.00)
Glucose, Bld: 175 mg/dL — ABNORMAL HIGH (ref 70–99)
Potassium: 4.6 meq/L (ref 3.5–5.1)
Sodium: 138 meq/L (ref 135–145)
Total Bilirubin: 0.3 mg/dL (ref 0.2–1.2)
Total Protein: 6.3 g/dL (ref 6.0–8.3)

## 2024-09-29 LAB — LIPID PANEL
Cholesterol: 111 mg/dL (ref 28–200)
HDL: 28.5 mg/dL — ABNORMAL LOW (ref >39.00)
LDL Cholesterol: 43 mg/dL (ref 10–99)
NonHDL: 82.12
Total CHOL/HDL Ratio: 4
Triglycerides: 198 mg/dL — ABNORMAL HIGH (ref 10.0–149.0)
VLDL: 39.6 mg/dL (ref 0.0–40.0)

## 2024-09-29 LAB — HEMOGLOBIN A1C: Hgb A1c MFr Bld: 8.3 % — ABNORMAL HIGH (ref 4.6–6.5)

## 2024-09-29 MED ORDER — HYDROCHLOROTHIAZIDE 25 MG PO TABS: 12.5000 mg | ORAL_TABLET | Freq: Every day | ORAL | 3 refills | Status: DC

## 2024-09-29 MED ORDER — ONDANSETRON 4 MG PO TBDP: 4.0000 mg | ORAL_TABLET | Freq: Every day | ORAL | 3 refills | Status: AC | PRN

## 2024-09-29 MED ORDER — BENZONATATE 200 MG PO CAPS: 200.0000 mg | ORAL_CAPSULE | Freq: Two times a day (BID) | ORAL | 0 refills | Status: DC | PRN

## 2024-09-29 MED ORDER — METFORMIN HCL 500 MG PO TABS: 500.0000 mg | ORAL_TABLET | Freq: Two times a day (BID) | ORAL | 3 refills | Status: AC

## 2024-09-29 MED ORDER — TIZANIDINE HCL 4 MG PO TABS: 4.0000 mg | ORAL_TABLET | Freq: Two times a day (BID) | ORAL | 3 refills | Status: AC | PRN

## 2024-09-29 NOTE — Progress Notes (Signed)
 Established Patient Office Visit Hospital follow up    Subjective  Patient ID: Jasmine Larsen, female    DOB: 1966-01-10  Age: 58 y.o. MRN: [id number]  Chief Complaint  Patient presents with   Diabetes   COPD   Hospitalization Follow-up   Flank Pain   HPI:   Hospitalized from 09/22/24-09/26/24 for pyelonephritis. She was also found to have hyponatremia. SGLT2 held in the hospital. Recommended to finish Bactrim  for a total 10-day course.   CT from 09/22/24 suspicious for b/l pyelonephritis, repeat CT from 09/25/24 not significantly changed from 09/22/24.  Continues to have bilateral lumbar spinal pain. Urinary symptoms resolved. She is holding off Jardiance .  She was started on vitamin D  50,000 international units once weekly and iron 325 mg daily during her hospital discharge.  Patient does not like taking iron due to GI side effect.  She is planning on starting vitamin D  once weekly.  OSA: Recently diagnosed. She needs to follow up with sleep clinic to start CPAP.   Chronic bilateral lower back pain:  Since her last visit she is established with pain clinic. Plans on following up with them.  During hospital discharge she received Oxycodone  10 tablets prescription and reports this has helped with back pain.  She continues to take Tizanidine  4 mg nightly.   Restless legs:  Symptoms has been stable on ropinirole  1 mg at bedtime.  Previously tried gabapentin  but did not like how it made her feel.  Recently got a message from her insurance saying ropinirole  is no longer going to be covered.  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  Jardiance  10 mg was held during her recent hospitalization.  She is currently on Ozempic  2 mg once weekly injection. She takes  rosuvastatin  20 mg daily.  She is not yet on antihypertensive medication.  She continues to drink sugary beverages.  Diet could be improved.  She is trying to  COPD, current everyday smoker: She has reduced smoking from 3 pack a day to 2 pack a day now.  She recently saw her pulmonologist Dr. Tamea and is planned to undergo pulmonary function test.  Request refill on Tessalon  200 mg for exacerbation of cough.    ROS As per HPI    Objective:     BP 128/80 (BP Location: Right Arm, Patient Position: Sitting, Cuff Size: Normal)   Pulse 80   Temp 97.9 F (36.6 C) (Oral)   Ht 5' 2 (1.575 m)   Wt (!) 356 lb 12.8 oz (161.8 kg)   SpO2 98%   BMI 65.26 kg/m      09/22/2024   11:11 AM 08/17/2024    9:05 AM 03/11/2024   11:35 AM  Depression screen PHQ 2/9  Decreased Interest 0 1 2  Down, Depressed, Hopeless 0 0 2  PHQ - 2 Score 0 1 4  Altered sleeping   3  Tired, decreased energy   1  Change in appetite   3  Feeling bad or failure about yourself    2  Trouble concentrating   1  Moving slowly or fidgety/restless   0  Suicidal thoughts   0  PHQ-9 Score   14   Difficult doing work/chores   Not difficult at all     Data saved with a previous flowsheet row definition  03/11/2024   11:36 AM 08/13/2023    1:38 PM 08/06/2023    1:40 PM 03/31/2023    9:37 AM  GAD 7 : Generalized Anxiety Score  Nervous, Anxious, on Edge 2 1 0 2  Control/stop worrying 1 2 3 2   Worry too much - different things 2 2 3 2   Trouble relaxing 2 2 3 2   Restless 1 1 1 1   Easily annoyed or irritable 2 1 3  0  Afraid - awful might happen 1 1 3  0  Total GAD 7 Score 11 10 16 9   Anxiety Difficulty Not difficult at all Not difficult at all Very difficult Not difficult at all      09/22/2024   11:11 AM 08/17/2024    9:05 AM 03/11/2024   11:35 AM  Depression screen PHQ 2/9  Decreased Interest 0 1 2  Down, Depressed, Hopeless 0 0 2  PHQ - 2 Score 0 1 4  Altered sleeping   3  Tired, decreased energy   1   Change in appetite   3  Feeling bad or failure about yourself    2  Trouble concentrating   1  Moving slowly or fidgety/restless   0  Suicidal thoughts   0  PHQ-9 Score   14   Difficult doing work/chores   Not difficult at all     Data saved with a previous flowsheet row definition      03/11/2024   11:36 AM 08/13/2023    1:38 PM 08/06/2023    1:40 PM 03/31/2023    9:37 AM  GAD 7 : Generalized Anxiety Score  Nervous, Anxious, on Edge 2 1 0 2  Control/stop worrying 1 2 3 2   Worry too much - different things 2 2 3 2   Trouble relaxing 2 2 3 2   Restless 1 1 1 1   Easily annoyed or irritable 2 1 3  0  Afraid - awful might happen 1 1 3  0  Total GAD 7 Score 11 10 16 9   Anxiety Difficulty Not difficult at all Not difficult at all Very difficult Not difficult at all   SDOH Screenings   Food Insecurity: Patient Declined (09/29/2024)  Housing: Low Risk (09/29/2024)  Transportation Needs: No Transportation Needs (09/29/2024)  Utilities: Not At Risk (09/22/2024)  Alcohol Screen: Low Risk (09/29/2024)  Depression (PHQ2-9): Low Risk (09/22/2024)  Financial Resource Strain: Patient Declined (09/29/2024)  Physical Activity: Inactive (09/29/2024)  Social Connections: Socially Isolated (09/29/2024)  Stress: Stress Concern Present (09/29/2024)  Tobacco Use: High Risk (09/29/2024)  Health Literacy: Adequate Health Literacy (02/22/2024)     Physical Exam Constitutional:      Appearance: She is obese.  HENT:     Head: Normocephalic and atraumatic.     Right Ear: Tympanic membrane normal.     Left Ear: Tympanic membrane normal.  Cardiovascular:     Rate and Rhythm: Normal rate.  Pulmonary:     Effort: Pulmonary effort is normal.     Breath sounds: Normal breath sounds. No wheezing.  Abdominal:     General: Abdomen is protuberant. Bowel sounds are normal.     Palpations: Abdomen is soft.     Tenderness: There is no guarding.  Musculoskeletal:     Lumbar back: Spasms (bilateral lumbar  paraspinal muscle spasm and pain on palpation) present.     Right lower leg: Edema present.     Left lower leg: Edema present.     Comments: Non pitting bilateral lower leg edema  Lymphadenopathy:     Cervical: No cervical adenopathy.  Skin:    General: Skin is warm.  Neurological:     Mental Status: She is alert and oriented to person, place, and time.  Psychiatric:        Mood and Affect: Mood normal.       Following lab results discussed during today's visit: Results for orders placed or performed in visit on 09/29/24  Comp Met (CMET)  Result Value Ref Range   Sodium 138 135 - 145 mEq/L   Potassium 4.6 3.5 - 5.1 mEq/L   Chloride 102 96 - 112 mEq/L   CO2 28 19 - 32 mEq/L   Glucose, Bld 175 (H) 70 - 99 mg/dL   BUN 10 6 - 23 mg/dL   Creatinine, Ser 9.35 0.40 - 1.20 mg/dL   Total Bilirubin 0.3 0.2 - 1.2 mg/dL   Alkaline Phosphatase 98 39 - 117 U/L   AST 19 5 - 37 U/L   ALT 30 3 - 35 U/L   Total Protein 6.3 6.0 - 8.3 g/dL   Albumin 3.5 3.5 - 5.2 g/dL   GFR 01.39 >39.99 mL/min   Calcium  8.4 8.4 - 10.5 mg/dL  Lipid panel  Result Value Ref Range   Cholesterol 111 28 - 200 mg/dL   Triglycerides 801.9 (H) 10.0 - 149.0 mg/dL   HDL 71.49 (L) >60.99 mg/dL   VLDL 60.3 0.0 - 59.9 mg/dL   LDL Cholesterol 43 10 - 99 mg/dL   Total CHOL/HDL Ratio 4    NonHDL 82.12   HgB A1c  Result Value Ref Range   Hgb A1c MFr Bld 8.3 (H) 4.6 - 6.5 %    The ASCVD Risk score (Arnett DK, et al., 2019) failed to calculate for the following reasons:   The valid total cholesterol range is 130 to 320 mg/dL     Assessment & Plan:  Patient is a pleasant 58 year old female presenting here with her daughter for hospital follow-up, chronic medication check. Assessment & Plan Hospital discharge follow-up Reviewed hospital admission, discharge note, labs and imagine from 09/22/24-09/26/24 for pyelonephritis. Hyponatremia resolved now. Renal function, liver function stable. Continue to hold off  Jardiance  for 2 weeks then start. If restarting Jardiance  causes urinary symptoms, recommend she holds off medication and reaches out to our clinic right away. Continue Bactrim  for total of 10 days which she is taking.     Type 2 diabetes mellitus with complications (HCC) A1c from earlier today shows 8.3%. Strongly encouraged patient to start taking metformin .  Reduced dose to 500 mg twice a day to help with medication compliance, reduce side effect.  Continue Ozempic  2 mg weekly.  Lifestyle modification including improving diet, cutting down on carbohydrate rich food discussed.  Hold off Jardiance  10 mg for 2 weeks then restart.If restarting Jardiance  causes urinary symptoms, recommend she holds off medication and reaches out to our clinic right away. Orders:   metFORMIN  (GLUCOPHAGE ) 500 MG tablet; Take 1 tablet (500 mg total) by mouth 2 (two) times daily with a meal.   Comp Met (CMET); Future   Urine Microalbumin w/creat. ratio; Future  Chronic midline low back pain with right-sided sciatica Increase dose of tizanidine  from 4 mg daily to 4 mg twice a day.  Potential side effect including dizziness, increased risk of fall discussed with the patient and recommended to reach out to our clinic if that occurs.  Continue follow-up with pain clinic.  Discouraged patient against continuing oxycodone  for chronic lumbar paraspinal muscle  spasm, pain.  Orders:   tiZANidine  (ZANAFLEX ) 4 MG tablet; Take 1 tablet (4 mg total) by mouth 2 (two) times daily as needed for muscle spasms.  Nausea Intermittent, refill on ondansetron  4 mg, as needed daily sent today. Orders:   ondansetron  (ZOFRAN -ODT) 4 MG disintegrating tablet; Take 1 tablet (4 mg total) by mouth daily as needed for nausea or vomiting.  Hypertension associated with diabetes (HCC) Reviewed CMP from earlier today.  Reassuring.  Goal blood pressure less than 130 x 80 mmHg.  Start hydrochlorothiazide  12.5 mg daily to help with blood pressure and lower  leg edema.  Follow-up in 1 month for repeat renal function. Orders:   Comp Met (CMET); Future  Vitamin D  deficiency Start prescribed vitamin D  50,000 international units once a week.    Tobacco abuse Brief smoking cessation counseling provided.    Restless leg syndrome Reviewed cost through [facility] for ropinirole  during her office visit today.  Also reviewed that [employer] covers 1 mg ropinirole  for 30 days for about $9 a month.  Recommend patient either tries GoodRx or continues to get her medication refilled through [facility] for cost effectiveness.  Continue ropinirole  1 mg at bedtime.    Hyperlipidemia associated with type 2 diabetes mellitus (HCC) Reviewed lipid panel from earlier this morning.  LDL within goal.  Continue rosuvastatin  20 mg daily.    Class 3 severe obesity due to excess calories with serious comorbidity and body mass index (BMI) of 60.0 to 69.9 in adult [facility]) Counseled on increasing mobility, healthy diet during today's visit.    Simple chronic bronchitis (HCC) COPD well-controlled with current treatment regimen however she continues to smoke 2 packs of cigarettes per day.  Smoking cessation counseling provided.  Requesting refill on Tessalon  for cough.  Refill sent.  Patient counseled to reach out to our clinic if she develops wheezing, shortness of breath, cough. Orders:   benzonatate  (TESSALON ) 200 MG capsule; Take 1 capsule (200 mg total) by mouth 2 (two) times daily as needed for cough.  OSA (obstructive sleep apnea) Recommended scheduling appointment with Dr. Jess, counseled on risks of untreated sleep apnea during today's visit as well.    Calculus of gallbladder without cholecystitis without obstruction Incidental found on CT imaging during her recent hospitalization.  Discussed symptoms related to cholecystitis, potential risk of cholecystitis. Further evaluation and treatment with surgical intervention was discussed.  Patient is asymptomatic and would  like to hold off on seeing general surgeon at this time.  She will reach out to our office if she develops abdominal Pain, nausea, vomiting.     Return for non-fating labs in 1 month, follow up with Dr. Abbey after 91 days .   Luke Abbey, MD

## 2024-09-29 NOTE — Assessment & Plan Note (Deleted)
 [name]

## 2024-09-29 NOTE — Assessment & Plan Note (Addendum)
 Reviewed lipid panel from earlier this morning.  LDL within goal.  Continue rosuvastatin  20 mg daily.

## 2024-09-29 NOTE — Patient Instructions (Addendum)
-   Please start Metformin  500 mg twice a day, please take this with food. Let's hold off on Jardiance  for now. Start this in 2 weeks from now or when your urinary symptoms fully resolved. If you start to have burning urination, frequency with feeing, or pain stop it.  - Please take once weekly vitamin D  for 2 weeks then take over the counter vitamin D  1000 units daily.  - B12: Take B12 1000 mcg daily.  - Try taking iron every other day with food.  - Try going up on Tizanidine  4 mg twice a day. When you get closer to needing a refill please call the pharmacy, I have sent refill.  - For the Ropinirole , try goodrx and let me know if that is going to be affordable.  - Start Hydrochlorothiazide  12.5 mg daily (1/2 tab of 25 mg)  in the morning.

## 2024-09-29 NOTE — Assessment & Plan Note (Deleted)
 Jasmine Larsen

## 2024-09-29 NOTE — Assessment & Plan Note (Addendum)
 Increase dose of tizanidine  from 4 mg daily to 4 mg twice a day.  Potential side effect including dizziness, increased risk of fall discussed with the patient and recommended to reach out to our clinic if that occurs.  Continue follow-up with pain clinic.  Discouraged patient against continuing oxycodone  for chronic lumbar paraspinal muscle spasm, pain.  Orders:   tiZANidine  (ZANAFLEX ) 4 MG tablet; Take 1 tablet (4 mg total) by mouth 2 (two) times daily as needed for muscle spasms.

## 2024-09-29 NOTE — Transitions of Care (Post Inpatient/ED Visit) (Signed)
° °  09/29/2024  Name: Jasmine Larsen MRN: [id number] DOB: 1966/04/12  Today's TOC FU Call Status: Today's TOC FU Call Status:: Unsuccessful Call (3rd Attempt) Unsuccessful Call (3rd Attempt) Date: 09/29/24  Attempted to reach the patient regarding the most recent Inpatient/ED visit.  Follow Up Plan: No further outreach attempts will be made at this time. We have been unable to contact the patient.  Alan Ee, RN, BSN, CEN [employer]- Transition of Care Team.  [facility] [phone]

## 2024-09-29 NOTE — Assessment & Plan Note (Deleted)
°  Orders:   tiZANidine  (ZANAFLEX ) 4 MG tablet; Take 1 tablet (4 mg total) by mouth 2 (two) times daily as needed for muscle spasms.

## 2024-09-29 NOTE — Assessment & Plan Note (Addendum)
 Reviewed cost through [facility] for ropinirole  during her office visit today.  Also reviewed that [employer] covers 1 mg ropinirole  for 30 days for about $9 a month.  Recommend patient either tries GoodRx or continues to get her medication refilled through [facility] for cost effectiveness.  Continue ropinirole  1 mg at bedtime.

## 2024-09-29 NOTE — Assessment & Plan Note (Addendum)
 Counseled on increasing mobility, healthy diet during today's visit.

## 2024-09-29 NOTE — Assessment & Plan Note (Addendum)
 A1c from earlier today shows 8.3%. Strongly encouraged patient to start taking metformin .  Reduced dose to 500 mg twice a day to help with medication compliance, reduce side effect.  Continue Ozempic  2 mg weekly.  Lifestyle modification including improving diet, cutting down on carbohydrate rich food discussed.  Hold off Jardiance  10 mg for 2 weeks then restart.If restarting Jardiance  causes urinary symptoms, recommend she holds off medication and reaches out to our clinic right away. Orders:   metFORMIN  (GLUCOPHAGE ) 500 MG tablet; Take 1 tablet (500 mg total) by mouth 2 (two) times daily with a meal.   Comp Met (CMET); Future   Urine Microalbumin w/creat. ratio; Future

## 2024-09-29 NOTE — Assessment & Plan Note (Addendum)
 Recommended scheduling appointment with Dr. Jess, counseled on risks of untreated sleep apnea during today's visit as well.

## 2024-09-29 NOTE — Assessment & Plan Note (Addendum)
 Reviewed CMP from earlier today.  Reassuring.  Goal blood pressure less than 130 x 80 mmHg.  Start hydrochlorothiazide  12.5 mg daily to help with blood pressure and lower leg edema.  Follow-up in 1 month for repeat renal function. Orders:   Comp Met (CMET); Future

## 2024-09-29 NOTE — Assessment & Plan Note (Addendum)
 Start prescribed vitamin D  50,000 international units once a week.

## 2024-09-29 NOTE — Assessment & Plan Note (Deleted)
°  Orders:   benzonatate  (TESSALON ) 200 MG capsule; Take 1 capsule (200 mg total) by mouth 2 (two) times daily as needed for cough.

## 2024-09-29 NOTE — Assessment & Plan Note (Addendum)
 Incidental found on CT imaging during her recent hospitalization.  Discussed symptoms related to cholecystitis, potential risk of cholecystitis. Further evaluation and treatment with surgical intervention was discussed.  Patient is asymptomatic and would like to hold off on seeing general surgeon at this time.  She will reach out to our office if she develops abdominal Pain, nausea, vomiting.

## 2024-09-29 NOTE — Progress Notes (Signed)
 Lab results discussed during office visit on 09/29/2024.  Luke Shade, MD

## 2024-09-29 NOTE — Assessment & Plan Note (Addendum)
Brief smoking cessation counseling provided

## 2024-09-29 NOTE — Assessment & Plan Note (Addendum)
 Reviewed hospital admission, discharge note, labs and imagine from 09/22/24-09/26/24 for pyelonephritis. Hyponatremia resolved now. Renal function, liver function stable. Continue to hold off Jardiance  for 2 weeks then start. If restarting Jardiance  causes urinary symptoms, recommend she holds off medication and reaches out to our clinic right away. Continue Bactrim  for total of 10 days which she is taking.

## 2024-09-29 NOTE — Assessment & Plan Note (Addendum)
 COPD well-controlled with current treatment regimen however she continues to smoke 2 packs of cigarettes per day.  Smoking cessation counseling provided.  Requesting refill on Tessalon  for cough.  Refill sent.  Patient counseled to reach out to our clinic if she develops wheezing, shortness of breath, cough. Orders:   benzonatate  (TESSALON ) 200 MG capsule; Take 1 capsule (200 mg total) by mouth 2 (two) times daily as needed for cough.

## 2024-09-29 NOTE — Assessment & Plan Note (Addendum)
 Intermittent, refill on ondansetron  4 mg, as needed daily sent today. Orders:   ondansetron  (ZOFRAN -ODT) 4 MG disintegrating tablet; Take 1 tablet (4 mg total) by mouth daily as needed for nausea or vomiting.

## 2024-10-03 ENCOUNTER — Encounter: Admitting: Dietician

## 2024-10-07 ENCOUNTER — Inpatient Hospital Stay: Admitting: Nurse Practitioner

## 2024-10-18 ENCOUNTER — Other Ambulatory Visit: Payer: Self-pay | Admitting: *Deleted

## 2024-10-20 ENCOUNTER — Ambulatory Visit: Admitting: Podiatry

## 2024-10-20 DIAGNOSIS — E1140 Type 2 diabetes mellitus with diabetic neuropathy, unspecified: Secondary | ICD-10-CM

## 2024-10-20 DIAGNOSIS — L84 Corns and callosities: Secondary | ICD-10-CM | POA: Diagnosis not present

## 2024-10-21 NOTE — Patient Instructions (Signed)
 Visit Information  Ms. Vizcarrondo was given information about Medicaid Managed Care team care coordination services as a part of their [employer] Medicaid benefit.   If you would like to schedule transportation through your [facility] plan, please call the following number at least 2 days in advance of your appointment: [phone]  If you are experiencing a behavioral health crisis, call the [employer] [place]  Behavioral Health Crisis Line at [phone]). The line is available 24 hours a day, seven days a week.     Care plan and visit instructions communicated with the patient verbally today. Patient agrees to receive a copy in MyChart. Active MyChart status and patient understanding of how to access instructions and care plan via MyChart confirmed with patient.     Licensed Clinical Social Worker will will follow up with patient on 11/04/24 at [employer], LCSW [place]  [facility], [facility] Licensed Clinical Social Worker  Direct Dial: [phone]     Following is a copy of your plan of care:   Goals Addressed             This Visit's Progress    VBCI Social Work Care Plan       Problems:   Lacks knowledge of how to connect to community resources to increase aerobic exercise  CSW Clinical Goal(s):   Over the next 90 days the Patient will work with the local gym to address needs related to need for aerobic exercise as evidenced by patient report Over the next 30 days patient will follow up with the [facility] regarding water aerobic class availability as evidenced by patient report  Interventions:  CSW  contacted local gyms to confirm  gym membership coverage offerings through her insurance             Confirmed that the [employer] offers financial assistance -dues determined on a sliding scale-form will need to be completed to begin process  Patient Goals/Self-Care  Activities:  Patient to review aerobic exercise schedule through gym of choice once options are received              Patient to complete form for financial assistance for the [employer]                Plan:   Telephone follow up appointment with care management team member scheduled for:  11/04/24

## 2024-10-21 NOTE — Patient Outreach (Signed)
 Complex Care Management   Visit Note  10/21/2024  Name:  Jasmine Larsen MRN: [id number] DOB: 06/04/1966  Situation: Referral received for Complex Care Management related to SDOH Barriers:  Physical Activity I obtained verbal consent from Patient.  Visit completed with Patient  on the phone on 10/18/24  Background:   Past Medical History:  Diagnosis Date   (HFpEF) heart failure with preserved ejection fraction (HCC)    a.) TTE 07/14/2016: EF 65-70%, no RWMAs, norm RVSF, mild MR; b.) TTE 08/12/2023: EF 55-60%, no RWMAs, G1DD, norm RVSF   Acute respiratory failure with hypoxia (HCC)    Anxiety    Asthma    Chest pain    COPD (chronic obstructive pulmonary disease) (HCC)    COPD with acute exacerbation (HCC) 04/27/2023   DDD (degenerative disc disease), lumbar    Depression    Hepatic steatosis    History of 2019 novel coronavirus disease (COVID-19) 07/19/2020   HLD (hyperlipidemia)    HTN (hypertension)    Lumbosacral spinal stenosis    Miscarriage    Morbid obesity (HCC)    Multiple gastric ulcers    Osteoarthritis    Peripheral edema    Pneumonia due to COVID-19 virus    Polyp of sigmoid colon 09/21/2023   Sepsis due to pneumonia (HCC)    Shortness of breath 07/13/2016   Sigmoid polyp    T2DM (type 2 diabetes mellitus) (HCC)    Vitamin B12 deficiency    Vitamin D  deficiency     Assessment: patient interested in increasing physical activity-would like resources for the [facility] Patient Reported Symptoms:  Cognitive Cognitive Status: Alert and oriented to person, place, and time, Insightful and able to interpret abstract concepts Cognitive/Intellectual Conditions Management [RPT]: None reported or documented in medical history or problem list      Neurological Neurological Review of Symptoms: No symptoms reported    HEENT HEENT Symptoms Reported: No symptoms reported      Cardiovascular Cardiovascular Symptoms Reported: No symptoms reported    Respiratory Respiratory  Symptoms Reported: Shortness of breath Additional Respiratory Details: COPD, sleepa apnea  nebulizer,  Pulmonologst [facility] Respiratory Management Strategies: Coping strategies, Medication therapy  Endocrine Endocrine Symptoms Reported: No symptoms reported Is patient diabetic?: Yes Is patient checking blood sugars at home?: Yes List most recent blood sugar readings, include date and time of day: 134 free style libre Endocrine Self-Management Outcome: 4 (good)  Gastrointestinal Gastrointestinal Symptoms Reported: No symptoms reported      Genitourinary Genitourinary Symptoms Reported: No symptoms reported    Integumentary Integumentary Symptoms Reported: No symptoms reported    Musculoskeletal Musculoskelatal Symptoms Reviewed: Back pain, Limited mobility Additional Musculoskeletal Details: cane and walker Musculoskeletal Management Strategies: Coping strategies, Medical device      Psychosocial       Quality of Family Relationships: supportive Do you feel physically threatened by others?: No    10/21/2024    PHQ2-9 Depression Screening   Little interest or pleasure in doing things Several days  Feeling down, depressed, or hopeless Not at all  PHQ-2 - Total Score 1  Trouble falling or staying asleep, or sleeping too much    Feeling tired or having little energy    Poor appetite or overeating     Feeling bad about yourself - or that you are a failure or have let yourself or your family down    Trouble concentrating on things, such as reading the newspaper or watching television    Moving or speaking  so slowly that other people could have noticed.  Or the opposite - being so fidgety or restless that you have been moving around a lot more than usual    Thoughts that you would be better off dead, or hurting yourself in some way    PHQ2-9 Total Score    If you checked off any problems, how difficult have these problems made it for you to do your work, take care of things  at home, or get along with other people    Depression Interventions/Treatment      There were no vitals filed for this visit. Pain Scale: 0-10 Pain Score: 7  Pain Location: Back Pain Orientation: Lower Pain Descriptors / Indicators: Aching Pain Onset: On-going Patients Stated Pain Goal: 0 Pain Intervention(s): Medication (See eMAR) Multiple Pain Sites: Yes  Medications Reviewed Today     Reviewed by Ermalinda Lenn HERO, LCSW (Social Worker) on 10/18/24 at 1126  Med List Status: <None>   Medication Order Taking? Sig Documenting Provider Last Dose Status Informant  Accu-Chek Softclix Lancets lancets [id number] Yes 1 each by Other route daily. Hope Merle, MD  Active Self  acetaminophen  (TYLENOL ) 325 MG tablet [id number] Yes Take 2 tablets (650 mg total) by mouth every 6 (six) hours as needed for mild pain (pain score 1-3) or fever (or Fever >/= 101). Franchot Novel, MD  Active   albuterol  (PROVENTIL ) (2.5 MG/3ML) 0.083% nebulizer solution [id number] Yes Take 3 mLs (2.5 mg total) by nebulization every 6 (six) hours as needed for wheezing or shortness of breath. Bair, Kalpana, MD  Active Self  albuterol  (VENTOLIN  HFA) 108 (90 Base) MCG/ACT inhaler [id number] Yes Inhale 1-2 puffs into the lungs every 6 (six) hours as needed for wheezing or shortness of breath. Bair, Luke, MD  Active Self  benzonatate  (TESSALON ) 200 MG capsule [id number] Yes Take 1 capsule (200 mg total) by mouth 2 (two) times daily as needed for cough. Bair, Luke, MD  Active   blood glucose meter kit and supplies KIT [id number] Yes Use up to 4 times daily. Hope Merle, MD  Active Self  Blood Glucose Monitoring Suppl DEVI [id number] Yes 1 each by Does not apply route as directed. May substitute to any manufacturer covered by patient's insurance. Hope Merle, MD  Active Self  dicyclomine  (BENTYL ) 20 MG tablet [id number] Yes Take 1 tablet (20 mg total) by mouth 3 (three) times daily as needed for spasms. Bair, Luke, MD  Active  Self  empagliflozin  (JARDIANCE ) 10 MG TABS tablet [id number]  Take 1 tablet (10 mg total) by mouth daily before breakfast.  Patient not taking: Reported on 09/29/2024   Bair, Kalpana, MD  Active Self  ferrous sulfate  325 (65 FE) MG tablet [id number]  Take 1 tablet (325 mg total) by mouth daily with breakfast.  Patient not taking: Reported on 10/18/2024   Franchot Novel, MD  Active   Fluticasone -Umeclidin-Vilant (TRELEGY ELLIPTA ) 100-62.5-25 MCG/ACT AEPB [id number] Yes Inhale 1 puff into the lungs daily. Bair, Luke, MD  Active Self  glucose blood (ACCU-CHEK GUIDE TEST) test strip [id number] Yes Use as instructed Bair, Kalpana, MD  Active Self  hydrochlorothiazide  (HYDRODIURIL ) 25 MG tablet [id number]  Take 0.5 tablets (12.5 mg total) by mouth daily. Bair, Luke, MD  Active   ipratropium-albuterol  (DUONEB) 0.5-2.5 (3) MG/3ML SOLN [id number] Yes Take 3 mLs by nebulization every 6 (six) hours as needed. Bair, Luke, MD  Active Self  metFORMIN  (GLUCOPHAGE ) 500 MG tablet [id number] Yes Take 1 tablet (500 mg total) by mouth 2 (  two) times daily with a meal. Bair, Luke, MD  Active   ondansetron  (ZOFRAN -ODT) 4 MG disintegrating tablet [id number] Yes Take 1 tablet (4 mg total) by mouth daily as needed for nausea or vomiting. Bair, Luke, MD  Active   rOPINIRole  (REQUIP ) 1 MG tablet [id number] Yes Take 1 tablet (1 mg total) by mouth at bedtime. Bair, Luke, MD  Active Self  rosuvastatin  (CRESTOR ) 20 MG tablet [id number] Yes Take 1 tablet (20 mg total) by mouth at bedtime. Bair, Luke, MD  Active Self  Semaglutide , 2 MG/DOSE, (OZEMPIC , 2 MG/DOSE,) 8 MG/3ML SOPN [id number] Yes Inject 2 mg into the skin once a week. Bair, Luke, MD  Active Self  tiZANidine  (ZANAFLEX ) 4 MG tablet [id number] Yes Take 1 tablet (4 mg total) by mouth 2 (two) times daily as needed for muscle spasms. Bair, Luke, MD  Active   Vitamin D , Ergocalciferol , (DRISDOL ) 1.25 MG (50000 UNIT) CAPS capsule [id number] Yes Take 1 capsule  (50,000 Units total) by mouth every 7 (seven) days for 8 doses. Franchot Novel, MD  Active             Recommendation:   PCP Follow-up Specialty provider follow-up as needed [facility] to increase physcial activiti  Follow Up Plan:   Telephone follow up appointment date/time:  11/04/24   Lenn Mean, LCSW   [facility] Licensed Clinical Social Worker  Direct Dial: [phone]

## 2024-10-24 ENCOUNTER — Ambulatory Visit: Payer: Self-pay

## 2024-10-24 ENCOUNTER — Ambulatory Visit (INDEPENDENT_AMBULATORY_CARE_PROVIDER_SITE_OTHER): Payer: Self-pay | Admitting: Sleep Medicine

## 2024-10-24 NOTE — Telephone Encounter (Signed)
 FYI Only or Action Required?: FYI only for provider: ED advised.  Patient was last seen in primary care on 09/29/2024 by [name], MD.  Called Nurse Triage reporting Dysuria.  Symptoms began 4 days ago.  Interventions attempted: OTC medications: tylenol .  Symptoms are: gradually worsening.  Triage Disposition: Go to ED Now (or PCP Triage)  Patient/caregiver understands and will follow disposition?: Yes   Copied from CRM #[id number]. Topic: Clinical - Red Word Triage >> Oct 24, 2024  3:26 PM Jasmine Larsen wrote: Red Word that prompted transfer to Nurse Triage: Patient called said she has another UTI and her kidneys are starting to hurt. Reason for Disposition  Vomiting  Answer Assessment - Initial Assessment Questions 1. SEVERITY: How bad is the pain?  (e.g., Scale 1-10; mild, moderate, or severe)     moderate 2. FREQUENCY: How many times have you had painful urination today?      pressure 3. PATTERN: Is pain present every time you urinate or just sometimes?      pressure 4. ONSET: When did the painful urination start?      4 days ago 5. FEVER: Do you have a fever? If Yes, ask: What is your temperature, how was it measured, and when did it start?     no 6. PAST UTI: Have you had a urine infection before? If Yes, ask: When was the last time? and What happened that time?      UTI a month ago 7. CAUSE: What do you think is causing the painful urination?  (e.g., UTI, scratch, Herpes sore)     UTI 8. OTHER SYMPTOMS: Do you have any other symptoms? (e.g., blood in urine, flank pain, genital sores, urgency, vaginal discharge)     Back pain, nausea, vomiting, incontinence  Protocols used: Urination Pain - Female-A-AH

## 2024-10-24 NOTE — Telephone Encounter (Signed)
 Spoke with patient daughter Leonette to follow up with her the patient and advised her that her mother needs to be seen due to her symptoms. Patient was informed they are any available appointments until Friday and let her know we do not suggest she wait that long. Also, informed her that Dr Abbey is out of the office today and tomorrow. Leonette verbalized understanding and states she will take her to the UC tonight or ED first thing in the morning.

## 2024-10-25 ENCOUNTER — Other Ambulatory Visit: Payer: Self-pay

## 2024-10-25 ENCOUNTER — Emergency Department

## 2024-10-25 ENCOUNTER — Inpatient Hospital Stay
Admission: EM | Admit: 2024-10-25 | Discharge: 2024-10-31 | DRG: 690 | Disposition: A | Discharge status: Home / Self Care | Attending: Internal Medicine | Admitting: Internal Medicine

## 2024-10-25 ENCOUNTER — Encounter: Payer: Self-pay | Admitting: Emergency Medicine

## 2024-10-25 DIAGNOSIS — K5900 Constipation, unspecified: Secondary | ICD-10-CM | POA: Diagnosis present

## 2024-10-25 DIAGNOSIS — Z7984 Long term (current) use of oral hypoglycemic drugs: Secondary | ICD-10-CM

## 2024-10-25 DIAGNOSIS — K580 Irritable bowel syndrome with diarrhea: Secondary | ICD-10-CM

## 2024-10-25 DIAGNOSIS — I110 Hypertensive heart disease with heart failure: Secondary | ICD-10-CM | POA: Diagnosis present

## 2024-10-25 DIAGNOSIS — Z79899 Other long term (current) drug therapy: Secondary | ICD-10-CM

## 2024-10-25 DIAGNOSIS — M1990 Unspecified osteoarthritis, unspecified site: Secondary | ICD-10-CM | POA: Diagnosis present

## 2024-10-25 DIAGNOSIS — Z87440 Personal history of urinary (tract) infections: Secondary | ICD-10-CM

## 2024-10-25 DIAGNOSIS — G2581 Restless legs syndrome: Secondary | ICD-10-CM

## 2024-10-25 DIAGNOSIS — N10 Acute tubulo-interstitial nephritis: Principal | ICD-10-CM | POA: Diagnosis present

## 2024-10-25 DIAGNOSIS — Z6841 Body Mass Index (BMI) 40.0 and over, adult: Secondary | ICD-10-CM

## 2024-10-25 DIAGNOSIS — J4489 Other specified chronic obstructive pulmonary disease: Secondary | ICD-10-CM | POA: Diagnosis present

## 2024-10-25 DIAGNOSIS — E119 Type 2 diabetes mellitus without complications: Secondary | ICD-10-CM | POA: Diagnosis present

## 2024-10-25 DIAGNOSIS — B9620 Unspecified Escherichia coli [E. coli] as the cause of diseases classified elsewhere: Secondary | ICD-10-CM | POA: Diagnosis present

## 2024-10-25 DIAGNOSIS — Z8701 Personal history of pneumonia (recurrent): Secondary | ICD-10-CM

## 2024-10-25 DIAGNOSIS — G8929 Other chronic pain: Secondary | ICD-10-CM

## 2024-10-25 DIAGNOSIS — Z801 Family history of malignant neoplasm of trachea, bronchus and lung: Secondary | ICD-10-CM

## 2024-10-25 DIAGNOSIS — M51369 Other intervertebral disc degeneration, lumbar region without mention of lumbar back pain or lower extremity pain: Secondary | ICD-10-CM | POA: Diagnosis present

## 2024-10-25 DIAGNOSIS — M4807 Spinal stenosis, lumbosacral region: Secondary | ICD-10-CM | POA: Diagnosis present

## 2024-10-25 DIAGNOSIS — E1169 Type 2 diabetes mellitus with other specified complication: Secondary | ICD-10-CM

## 2024-10-25 DIAGNOSIS — Z7985 Long-term (current) use of injectable non-insulin antidiabetic drugs: Secondary | ICD-10-CM

## 2024-10-25 DIAGNOSIS — Z87891 Personal history of nicotine dependence: Secondary | ICD-10-CM

## 2024-10-25 DIAGNOSIS — E118 Type 2 diabetes mellitus with unspecified complications: Secondary | ICD-10-CM

## 2024-10-25 DIAGNOSIS — F419 Anxiety disorder, unspecified: Secondary | ICD-10-CM | POA: Diagnosis present

## 2024-10-25 DIAGNOSIS — J209 Acute bronchitis, unspecified: Secondary | ICD-10-CM

## 2024-10-25 DIAGNOSIS — N390 Urinary tract infection, site not specified: Secondary | ICD-10-CM | POA: Diagnosis present

## 2024-10-25 DIAGNOSIS — Z833 Family history of diabetes mellitus: Secondary | ICD-10-CM

## 2024-10-25 DIAGNOSIS — N12 Tubulo-interstitial nephritis, not specified as acute or chronic: Principal | ICD-10-CM | POA: Diagnosis present

## 2024-10-25 DIAGNOSIS — Z825 Family history of asthma and other chronic lower respiratory diseases: Secondary | ICD-10-CM

## 2024-10-25 DIAGNOSIS — E785 Hyperlipidemia, unspecified: Secondary | ICD-10-CM | POA: Diagnosis present

## 2024-10-25 DIAGNOSIS — K760 Fatty (change of) liver, not elsewhere classified: Secondary | ICD-10-CM | POA: Diagnosis present

## 2024-10-25 DIAGNOSIS — I5032 Chronic diastolic (congestive) heart failure: Secondary | ICD-10-CM | POA: Diagnosis present

## 2024-10-25 DIAGNOSIS — Z8616 Personal history of COVID-19: Secondary | ICD-10-CM

## 2024-10-25 DIAGNOSIS — Z8249 Family history of ischemic heart disease and other diseases of the circulatory system: Secondary | ICD-10-CM

## 2024-10-25 LAB — BASIC METABOLIC PANEL WITH GFR
Anion gap: 15 (ref 5–15)
BUN: 7 mg/dL (ref 6–20)
CO2: 23 mmol/L (ref 22–32)
Calcium: 9.7 mg/dL (ref 8.9–10.3)
Chloride: 95 mmol/L — ABNORMAL LOW (ref 98–111)
Creatinine, Ser: 1.03 mg/dL — ABNORMAL HIGH (ref 0.44–1.00)
GFR, Estimated: >60 mL/min
Glucose, Bld: 308 mg/dL — ABNORMAL HIGH (ref 70–99)
Potassium: 3.8 mmol/L (ref 3.5–5.1)
Sodium: 133 mmol/L — ABNORMAL LOW (ref 135–145)

## 2024-10-25 LAB — URINALYSIS, ROUTINE W REFLEX MICROSCOPIC
Bacteria, UA: NONE SEEN
Bilirubin Urine: NEGATIVE
Glucose, UA: >=500 mg/dL — AB
Ketones, ur: NEGATIVE mg/dL
Nitrite: NEGATIVE
Protein, ur: 100 mg/dL — AB
Specific Gravity, Urine: 1.009 (ref 1.005–1.030)
WBC, UA: >50 WBC/hpf (ref 0–5)
pH: 6 (ref 5.0–8.0)

## 2024-10-25 LAB — GLUCOSE, CAPILLARY
Glucose-Capillary: 178 mg/dL — ABNORMAL HIGH (ref 70–99)
Glucose-Capillary: 194 mg/dL — ABNORMAL HIGH (ref 70–99)

## 2024-10-25 LAB — CBC
HCT: 44.5 % (ref 36.0–46.0)
Hemoglobin: 14.6 g/dL (ref 12.0–15.0)
MCH: 27.8 pg (ref 26.0–34.0)
MCHC: 32.8 g/dL (ref 30.0–36.0)
MCV: 84.8 fL (ref 80.0–100.0)
Platelets: 257 K/uL (ref 150–400)
RBC: 5.25 MIL/uL — ABNORMAL HIGH (ref 3.87–5.11)
RDW: 14.3 % (ref 11.5–15.5)
WBC: 14.1 K/uL — ABNORMAL HIGH (ref 4.0–10.5)
nRBC: 0 % (ref 0.0–0.2)

## 2024-10-25 LAB — LACTIC ACID, PLASMA
Lactic Acid, Venous: 1.9 mmol/L (ref 0.5–1.9)
Lactic Acid, Venous: 1.2 mmol/L (ref 0.5–1.9)

## 2024-10-25 MED ORDER — ACETAMINOPHEN 325 MG PO TABS: 650.0000 mg | ORAL_TABLET | Freq: Four times a day (QID) | ORAL | Status: DC | PRN

## 2024-10-25 MED ORDER — SODIUM CHLORIDE 0.9 % IV SOLN: 1.0000 g | Freq: Once | INTRAVENOUS | Status: DC

## 2024-10-25 MED ORDER — ACETAMINOPHEN 650 MG RE SUPP: 650.0000 mg | Freq: Four times a day (QID) | RECTAL | Status: DC | PRN

## 2024-10-25 MED ORDER — ROPINIROLE HCL 1 MG PO TABS
1.0000 mg | ORAL_TABLET | Freq: Every day | ORAL | Status: DC
  Administered 2024-10-25 – 2024-10-30 (×6): 1 mg via ORAL
  Filled 2024-10-25 (×6): qty 1

## 2024-10-25 MED ORDER — SODIUM CHLORIDE 0.9 % IV SOLN
2.0000 g | INTRAVENOUS | Status: AC
  Administered 2024-10-25 – 2024-10-31 (×7): 2 g via INTRAVENOUS
  Filled 2024-10-25 (×8): qty 20

## 2024-10-25 MED ORDER — DICYCLOMINE HCL 20 MG PO TABS: 20.0000 mg | ORAL_TABLET | Freq: Three times a day (TID) | ORAL | Status: DC | PRN

## 2024-10-25 MED ORDER — INSULIN ASPART 100 UNIT/ML IJ SOLN
0.0000 [IU] | Freq: Every day | INTRAMUSCULAR | Status: DC
  Administered 2024-10-28: 3 [IU] via SUBCUTANEOUS
  Filled 2024-10-25: qty 3

## 2024-10-25 MED ORDER — TIZANIDINE HCL 4 MG PO TABS: 4.0000 mg | ORAL_TABLET | Freq: Two times a day (BID) | ORAL | Status: DC | PRN

## 2024-10-25 MED ORDER — ONDANSETRON HCL 4 MG/2ML IJ SOLN
4.0000 mg | Freq: Once | INTRAMUSCULAR | Status: AC
  Administered 2024-10-25: 4 mg via INTRAVENOUS
  Filled 2024-10-25: qty 2

## 2024-10-25 MED ORDER — INSULIN ASPART 100 UNIT/ML IJ SOLN
0.0000 [IU] | Freq: Three times a day (TID) | INTRAMUSCULAR | Status: DC
  Administered 2024-10-25 – 2024-10-26 (×2): 4 [IU] via SUBCUTANEOUS
  Administered 2024-10-26: 7 [IU] via SUBCUTANEOUS
  Administered 2024-10-26: 4 [IU] via SUBCUTANEOUS
  Administered 2024-10-27: 7 [IU] via SUBCUTANEOUS
  Administered 2024-10-27: 4 [IU] via SUBCUTANEOUS
  Administered 2024-10-27: 7 [IU] via SUBCUTANEOUS
  Administered 2024-10-28: 4 [IU] via SUBCUTANEOUS
  Administered 2024-10-28: 7 [IU] via SUBCUTANEOUS
  Administered 2024-10-28 – 2024-10-29 (×2): 4 [IU] via SUBCUTANEOUS
  Administered 2024-10-29: 7 [IU] via SUBCUTANEOUS
  Administered 2024-10-29 – 2024-10-30 (×4): 3 [IU] via SUBCUTANEOUS
  Administered 2024-10-31: 4 [IU] via SUBCUTANEOUS
  Filled 2024-10-25: qty 7
  Filled 2024-10-25: qty 3
  Filled 2024-10-25: qty 4
  Filled 2024-10-25: qty 7
  Filled 2024-10-25 (×4): qty 4
  Filled 2024-10-25: qty 7
  Filled 2024-10-25: qty 4
  Filled 2024-10-25 (×2): qty 3
  Filled 2024-10-25 (×2): qty 7
  Filled 2024-10-25 (×3): qty 4

## 2024-10-25 MED ORDER — TRAZODONE HCL 50 MG PO TABS
25.0000 mg | ORAL_TABLET | Freq: Every evening | ORAL | Status: DC | PRN
  Administered 2024-10-30: 25 mg via ORAL
  Filled 2024-10-25: qty 1

## 2024-10-25 MED ORDER — ROPINIROLE HCL 1 MG PO TABS: 1.0000 mg | ORAL_TABLET | Freq: Three times a day (TID) | ORAL | Status: DC

## 2024-10-25 MED ORDER — ONDANSETRON HCL 4 MG PO TABS: 4.0000 mg | ORAL_TABLET | Freq: Four times a day (QID) | ORAL | Status: DC | PRN

## 2024-10-25 MED ORDER — HYDROCODONE-ACETAMINOPHEN 5-325 MG PO TABS
1.0000 | ORAL_TABLET | ORAL | Status: DC | PRN
  Administered 2024-10-25 – 2024-10-28 (×9): 2 via ORAL
  Filled 2024-10-25 (×9): qty 2

## 2024-10-25 MED ORDER — IPRATROPIUM-ALBUTEROL 0.5-2.5 (3) MG/3ML IN SOLN: 3.0000 mL | Freq: Four times a day (QID) | RESPIRATORY_TRACT | Status: DC | PRN

## 2024-10-25 MED ORDER — METFORMIN HCL 500 MG PO TABS: 500.0000 mg | ORAL_TABLET | Freq: Two times a day (BID) | ORAL | Status: DC

## 2024-10-25 MED ORDER — ONDANSETRON HCL 4 MG/2ML IJ SOLN
4.0000 mg | Freq: Four times a day (QID) | INTRAMUSCULAR | Status: DC | PRN
  Administered 2024-10-26 (×2): 4 mg via INTRAVENOUS
  Filled 2024-10-25 (×2): qty 2

## 2024-10-25 MED ORDER — METOCLOPRAMIDE HCL 5 MG/ML IJ SOLN
10.0000 mg | Freq: Once | INTRAMUSCULAR | Status: AC
  Administered 2024-10-25: 10 mg via INTRAVENOUS
  Filled 2024-10-25: qty 2

## 2024-10-25 MED ORDER — ROSUVASTATIN CALCIUM 10 MG PO TABS
20.0000 mg | ORAL_TABLET | Freq: Every day | ORAL | Status: DC
  Administered 2024-10-25 – 2024-10-31 (×7): 20 mg via ORAL
  Filled 2024-10-25 (×7): qty 2
  Filled 2024-10-25: qty 1

## 2024-10-25 MED ORDER — KETOROLAC TROMETHAMINE 30 MG/ML IJ SOLN: 30.0000 mg | Freq: Four times a day (QID) | INTRAMUSCULAR | Status: DC | PRN

## 2024-10-25 MED ORDER — SODIUM CHLORIDE 0.9 % IV BOLUS
1000.0000 mL | Freq: Once | INTRAVENOUS | Status: AC
  Administered 2024-10-25: 1000 mL via INTRAVENOUS

## 2024-10-25 MED ORDER — ENOXAPARIN SODIUM 80 MG/0.8ML IJ SOSY
80.0000 mg | PREFILLED_SYRINGE | Freq: Every day | INTRAMUSCULAR | Status: DC
  Administered 2024-10-25 – 2024-10-30 (×6): 80 mg via SUBCUTANEOUS
  Filled 2024-10-25 (×6): qty 0.8

## 2024-10-25 MED ORDER — BUDESON-GLYCOPYRROL-FORMOTEROL 160-9-4.8 MCG/ACT IN AERO
2.0000 | INHALATION_SPRAY | Freq: Two times a day (BID) | RESPIRATORY_TRACT | Status: DC
  Filled 2024-10-25: qty 5.9

## 2024-10-25 MED ORDER — LOSARTAN POTASSIUM 25 MG PO TABS
25.0000 mg | ORAL_TABLET | Freq: Every day | ORAL | Status: DC
  Administered 2024-10-26 – 2024-10-31 (×5): 25 mg via ORAL
  Filled 2024-10-25 (×6): qty 1

## 2024-10-25 MED ORDER — HYDROCHLOROTHIAZIDE 12.5 MG PO TABS
12.5000 mg | ORAL_TABLET | Freq: Every day | ORAL | Status: DC
  Administered 2024-10-26 – 2024-10-27 (×2): 12.5 mg via ORAL
  Filled 2024-10-25 (×2): qty 1

## 2024-10-25 MED ORDER — IOHEXOL 300 MG/ML  SOLN
100.0000 mL | Freq: Once | INTRAMUSCULAR | Status: AC | PRN
  Administered 2024-10-25: 100 mL via INTRAVENOUS

## 2024-10-25 MED ORDER — FENTANYL CITRATE (PF) 50 MCG/ML IJ SOSY
50.0000 ug | PREFILLED_SYRINGE | Freq: Once | INTRAMUSCULAR | Status: AC
  Administered 2024-10-25: 50 ug via INTRAVENOUS
  Filled 2024-10-25: qty 1

## 2024-10-25 MED ORDER — SENNOSIDES-DOCUSATE SODIUM 8.6-50 MG PO TABS
1.0000 | ORAL_TABLET | Freq: Every evening | ORAL | Status: DC | PRN
  Administered 2024-10-27: 1 via ORAL
  Filled 2024-10-25: qty 1

## 2024-10-25 NOTE — Progress Notes (Signed)
 PHARMACIST - PHYSICIAN COMMUNICATION  CONCERNING:  Enoxaparin  (Lovenox ) for DVT Prophylaxis    RECOMMENDATION: Patient was prescribed enoxaprin 40mg  q24 hours for VTE prophylaxis.   Filed Weights   10/25/24 1245  Weight: (!) 161.8 kg (356 lb 11.3 oz)    Body mass index is 65.24 kg/m.  Estimated Creatinine Clearance: 91.3 mL/min (A) (by C-G formula based on SCr of 1.03 mg/dL (H)).   Based on [facility] policy patient is candidate for enoxaparin  0.5mg /kg TBW SQ every 24 hours based on BMI being >30.   DESCRIPTION: Pharmacy has adjusted enoxaparin  dose per [facility] policy.  Patient is now receiving enoxaparin  80 mg every 25 hours    Jasmine Larsen, PharmD Clinical Pharmacist  10/25/2024 2:24 PM

## 2024-10-25 NOTE — ED Provider Notes (Signed)
 "  [facility] Provider Note    Event Date/Time   First MD Initiated Contact with Patient 10/25/24 1227     (approximate)   History   Emesis   HPI  Jasmine Larsen is a 59 y.o. female history of heart failure, diabetes, sepsis, hypertension, morbid obesity presents emergency department with low back pain, flank pain, and UTI symptoms.  Some nausea and vomiting.  No fever.  Patient had same symptoms a month ago and was admitted for the same.  States was put back on Jardiance  less than a week ago and symptoms started afterwards.      Physical Exam   Triage Vital Signs: ED Triage Vitals  Encounter Vitals Group     BP 10/25/24 1119 124/77     Girls Systolic BP Percentile --      Girls Diastolic BP Percentile --      Boys Systolic BP Percentile --      Boys Diastolic BP Percentile --      Pulse Rate 10/25/24 1119 100     Resp 10/25/24 1119 16     Temp 10/25/24 1119 98.4 F (36.9 C)     Temp src --      SpO2 10/25/24 1119 97 %     Weight --      Height --      Head Circumference --      Peak Flow --      Pain Score 10/25/24 1118 10     Pain Loc --      Pain Education --      Exclude from Growth Chart --     Most recent vital signs: Vitals:   10/25/24 1119  BP: 124/77  Pulse: 100  Resp: 16  Temp: 98.4 F (36.9 C)  SpO2: 97%     General: Awake, no distress.   CV:  Good peripheral perfusion. regular rate and  rhythm Resp:  Normal effort. Lungs cta Abd:  No distention.  Nontender Other:      ED Results / Procedures / Treatments   Labs (all labs ordered are listed, but only abnormal results are displayed) Labs Reviewed  URINALYSIS, ROUTINE W REFLEX MICROSCOPIC - Abnormal; Notable for the following components:      Result Value   Color, Urine AMBER (*)    APPearance TURBID (*)    Glucose, UA >=500 (*)    Hgb urine dipstick MODERATE (*)    Protein, ur 100 (*)    Leukocytes,Ua LARGE (*)    All other components within normal  limits  BASIC METABOLIC PANEL WITH GFR - Abnormal; Notable for the following components:   Sodium 133 (*)    Chloride 95 (*)    Glucose, Bld 308 (*)    Creatinine, Ser 1.03 (*)    All other components within normal limits  CBC - Abnormal; Notable for the following components:   WBC 14.1 (*)    RBC 5.25 (*)    All other components within normal limits  CULTURE, BLOOD (ROUTINE X 2)  CULTURE, BLOOD (ROUTINE X 2)  URINE CULTURE  LACTIC ACID, PLASMA  LACTIC ACID, PLASMA     EKG     RADIOLOGY CT abdomen pelvis IV contrast    PROCEDURES:   Procedures  Critical Care:  no Chief Complaint  Patient presents with   Emesis      MEDICATIONS ORDERED IN ED: Medications  hydrochlorothiazide  (HYDRODIURIL ) tablet 12.5 mg (has no administration in time range)  rosuvastatin  (CRESTOR ) tablet 20 mg (has no administration in time range)  metFORMIN  (GLUCOPHAGE ) tablet 500 mg (has no administration in time range)  dicyclomine  (BENTYL ) tablet 20 mg (has no administration in time range)  rOPINIRole  (REQUIP ) tablet 1 mg (has no administration in time range)  tiZANidine  (ZANAFLEX ) tablet 4 mg (has no administration in time range)  budesonide -glycopyrrolate -formoterol  (BREZTRI ) 160-9-4.8 MCG/ACT inhaler 2 puff (has no administration in time range)  ipratropium-albuterol  (DUONEB) 0.5-2.5 (3) MG/3ML nebulizer solution 3 mL (has no administration in time range)  cefTRIAXone  (ROCEPHIN ) 2 g in sodium chloride  0.9 % 100 mL IVPB (has no administration in time range)  enoxaparin  (LOVENOX ) injection 80 mg (has no administration in time range)  acetaminophen  (TYLENOL ) tablet 650 mg (has no administration in time range)    Or  acetaminophen  (TYLENOL ) suppository 650 mg (has no administration in time range)  ketorolac  (TORADOL ) 30 MG/ML injection 30 mg (has no administration in time range)  HYDROcodone -acetaminophen  (NORCO/VICODIN) 5-325 MG per tablet 1-2 tablet (has no administration in time range)   senna-docusate (Senokot-S) tablet 1 tablet (has no administration in time range)  traZODone  (DESYREL ) tablet 25 mg (has no administration in time range)  ondansetron  (ZOFRAN ) tablet 4 mg (has no administration in time range)    Or  ondansetron  (ZOFRAN ) injection 4 mg (has no administration in time range)  insulin  aspart (novoLOG ) injection 0-20 Units (has no administration in time range)  insulin  aspart (novoLOG ) injection 0-5 Units (has no administration in time range)  losartan  (COZAAR ) tablet 25 mg (has no administration in time range)  sodium chloride  0.9 % bolus 1,000 mL (1,000 mLs Intravenous New Bag/Given 10/25/24 1320)  fentaNYL  (SUBLIMAZE ) injection 50 mcg (50 mcg Intravenous Given 10/25/24 1320)  ondansetron  (ZOFRAN ) injection 4 mg (4 mg Intravenous Given 10/25/24 1321)  iohexol  (OMNIPAQUE ) 300 MG/ML solution 100 mL (100 mLs Intravenous Contrast Given 10/25/24 1308)     IMPRESSION / MDM / ASSESSMENT AND PLAN / ED COURSE  I reviewed the triage vital signs and the nursing notes.                              Differential diagnosis includes, but is not limited to, UTI, pyelonephritis, flank pain, sepsis  Patient's presentation is most consistent with acute presentation with potential threat to life or bodily function.   Medications given: Normal saline, Rocephin , fentanyl  50 mcg IV  Patient's urinalysis shows large amount of leuks, greater than 50 WBCs and white blood cell clumps being present.  CBC has elevated white count of 14.1 indicating infection.  Basic metabolic panel also shows elevated glucose of 308, however anion gap is reassuring  CT abdomen pelvis IV contrast once again shows bilateral pyelonephritis.  This was independent review interpretation by me by reading radiologist interpretation.  Due to recurrent pyelonephritis along with elevated white count and patient being on Jardiance , GLP-1, and metformin  do feel that she warrants admission to be carefully evaluated.   Patient is in agreement for admission.  She is stable at time.  Consult, hospitalist, spoke with Dr. Laurita, will be admitting patient.   We did add a lactic, Rocephin , and blood cultures prior to antibiotics being run.   FINAL CLINICAL IMPRESSION(S) / ED DIAGNOSES   Final diagnoses:  Pyelonephritis     Rx / DC Orders   ED Discharge Orders     None        Note:  This document was prepared using Dragon  voice recognition software and may include unintentional dictation errors.    Gasper Devere ORN, PA-C 10/25/24 1452    Jossie Artist POUR, MD 10/25/24 [phone]  "

## 2024-10-25 NOTE — ED Triage Notes (Signed)
 Pt reports emesis and bilateral lower back pain. Pt reports she is having trouble holding her urine. Unsure of fevers at home.

## 2024-10-25 NOTE — H&P (Addendum)
 " History and Physical    Jasmine Larsen FMW:[id number] DOB: 1966-04-10 DOA: 10/25/2024  PCP: [name], MD (Confirm with patient/family/NH records and if not entered, this has to be entered at [facility] point of entry) Patient coming from: Home  I have personally briefly reviewed patient's old medical records in [facility]  Chief Complaint: Dysuria, back pain  HPI: Jasmine Larsen is a 59 y.o. female with medical history significant of IIDM, morbid obesity, HTN, chronic HFpEF, asthma/COPD, recently diagnosed pyelonephritis, presented with recurrent urinary symptoms including dysuria, urinary frequency and bilateral back pain.  Symptoms started 2 to 3 days ago when patient started to have urinary frequency associate with burning sensation urinary.  Yesterday she started with bilateral back pain, associated with episode chills but no fever.  The symptoms were very similar to what she had during another process of pyelonephritis/UTI last month.  She is still taking Jardiance .  ED Course: Afebrile, borderline tachycardia blood pressure 120/77.  Saturation 97% on room air.  UA showed 3+ WBC and 2+ RBC, WBC 14 hemoglobin 14, BUN 7 creatinine 1.0 glucose 308.  Patient was given ceftriaxone  in the ED.  Review of Systems: As per HPI otherwise 14 point review of systems negative.    Past Medical History:  Diagnosis Date   (HFpEF) heart failure with preserved ejection fraction (HCC)    a.) TTE 07/14/2016: EF 65-70%, no RWMAs, norm RVSF, mild MR; b.) TTE 08/12/2023: EF 55-60%, no RWMAs, G1DD, norm RVSF   Acute respiratory failure with hypoxia (HCC)    Anxiety    Asthma    Chest pain    COPD (chronic obstructive pulmonary disease) (HCC)    COPD with acute exacerbation (HCC) 04/27/2023   DDD (degenerative disc disease), lumbar    Depression    Hepatic steatosis    History of 2019 novel coronavirus disease (COVID-19) 07/19/2020   HLD (hyperlipidemia)    HTN (hypertension)    Lumbosacral  spinal stenosis    Miscarriage    Morbid obesity (HCC)    Multiple gastric ulcers    Osteoarthritis    Peripheral edema    Pneumonia due to COVID-19 virus    Polyp of sigmoid colon 09/21/2023   Sepsis due to pneumonia (HCC)    Shortness of breath 07/13/2016   Sigmoid polyp    T2DM (type 2 diabetes mellitus) (HCC)    Vitamin B12 deficiency    Vitamin D  deficiency     Past Surgical History:  Procedure Laterality Date   CESAREAN SECTION     x3   COLONOSCOPY WITH PROPOFOL  N/A 09/21/2023   Procedure: COLONOSCOPY WITH PROPOFOL ;  Surgeon: Unk Corinn Skiff, MD;  Location: [facility] ENDOSCOPY;  Service: Gastroenterology;  Laterality: N/A;   MASS EXCISION Right 10/28/2023   Procedure: INCISION AND REMOVAL OF RIGHT FOREARM MASS;  Surgeon: Edie Norleen PARAS, MD;  Location: [facility] ORS;  Service: Orthopedics;  Laterality: Right;   POLYPECTOMY  09/21/2023   Procedure: POLYPECTOMY;  Surgeon: Unk Corinn Skiff, MD;  Location: [facility] ENDOSCOPY;  Service: Gastroenterology;;   TUBAL LIGATION  April 21 1991     reports that she has been smoking cigarettes. She started smoking about 43 years ago. She has a 129.1 pack-year smoking history. She has been exposed to tobacco smoke. She has never used smokeless tobacco. She reports that she does not drink alcohol and does not use drugs.  Allergies[1]  Family History  Problem Relation Age of Onset   Asthma Mother    Diabetes Mother  COPD Mother    Heart Problems Mother    Diabetes Brother    COPD Brother    Congestive Heart Failure Brother    Early death Brother    Alcohol abuse Maternal Grandmother    Tuberculosis Maternal Grandmother    Lung cancer Maternal Grandmother      Prior to Admission medications  Medication Sig Start Date End Date Taking? Authorizing Provider  Accu-Chek Softclix Lancets lancets 1 each by Other route daily. 03/11/24   Hope Merle, MD  acetaminophen  (TYLENOL ) 325 MG tablet Take 2 tablets (650 mg total) by mouth every 6 (six)  hours as needed for mild pain (pain score 1-3) or fever (or Fever >/= 101). 09/26/24   Franchot Novel, MD  albuterol  (PROVENTIL ) (2.5 MG/3ML) 0.083% nebulizer solution Take 3 mLs (2.5 mg total) by nebulization every 6 (six) hours as needed for wheezing or shortness of breath. 06/30/24   Bair, Kalpana, MD  albuterol  (VENTOLIN  HFA) 108 (90 Base) MCG/ACT inhaler Inhale 1-2 puffs into the lungs every 6 (six) hours as needed for wheezing or shortness of breath. 06/29/24   Bair, Kalpana, MD  benzonatate  (TESSALON ) 200 MG capsule Take 1 capsule (200 mg total) by mouth 2 (two) times daily as needed for cough. 09/29/24   Bair, Kalpana, MD  blood glucose meter kit and supplies KIT Use up to 4 times daily. 08/17/23   Hope Merle, MD  Blood Glucose Monitoring Suppl DEVI 1 each by Does not apply route as directed. May substitute to any manufacturer covered by patient's insurance. 08/13/23   Hope Merle, MD  dicyclomine  (BENTYL ) 20 MG tablet Take 1 tablet (20 mg total) by mouth 3 (three) times daily as needed for spasms. 06/29/24   Bair, Kalpana, MD  [Paused] empagliflozin  (JARDIANCE ) 10 MG TABS tablet Take 1 tablet (10 mg total) by mouth daily before breakfast. Patient not taking: Reported on 10/21/2024 Wait to take this until your doctor or other care provider tells you to start again. 06/29/24   Bair, Kalpana, MD  ferrous sulfate  325 (65 FE) MG tablet Take 1 tablet (325 mg total) by mouth daily with breakfast. Patient not taking: Reported on 10/18/2024 09/27/24   Franchot Novel, MD  Fluticasone -Umeclidin-Vilant (TRELEGY ELLIPTA ) 100-62.5-25 MCG/ACT AEPB Inhale 1 puff into the lungs daily. 06/29/24   Bair, Kalpana, MD  glucose blood (ACCU-CHEK GUIDE TEST) test strip Use as instructed 07/29/24   Bair, Kalpana, MD  hydrochlorothiazide  (HYDRODIURIL ) 25 MG tablet Take 0.5 tablets (12.5 mg total) by mouth daily. 09/29/24   Bair, Kalpana, MD  ipratropium-albuterol  (DUONEB) 0.5-2.5 (3) MG/3ML SOLN Take 3 mLs by  nebulization every 6 (six) hours as needed. 06/29/24   Bair, Kalpana, MD  metFORMIN  (GLUCOPHAGE ) 500 MG tablet Take 1 tablet (500 mg total) by mouth 2 (two) times daily with a meal. 09/29/24   Bair, Luke, MD  ondansetron  (ZOFRAN -ODT) 4 MG disintegrating tablet Take 1 tablet (4 mg total) by mouth daily as needed for nausea or vomiting. 09/29/24   Bair, Kalpana, MD  rOPINIRole  (REQUIP ) 1 MG tablet Take 1 tablet (1 mg total) by mouth at bedtime. 06/29/24   Bair, Kalpana, MD  rosuvastatin  (CRESTOR ) 20 MG tablet Take 1 tablet (20 mg total) by mouth at bedtime. 06/29/24   Bair, Kalpana, MD  Semaglutide , 2 MG/DOSE, (OZEMPIC , 2 MG/DOSE,) 8 MG/3ML SOPN Inject 2 mg into the skin once a week. 06/29/24   Bair, Kalpana, MD  tiZANidine  (ZANAFLEX ) 4 MG tablet Take 1 tablet (4 mg total) by mouth 2 (two)  times daily as needed for muscle spasms. 09/29/24   Bair, Kalpana, MD  Vitamin D , Ergocalciferol , (DRISDOL ) 1.25 MG (50000 UNIT) CAPS capsule Take 1 capsule (50,000 Units total) by mouth every 7 (seven) days for 8 doses. 09/26/24 11/21/24  Franchot Novel, MD    Physical Exam: Vitals:   10/25/24 1119 10/25/24 1245  BP: 124/77   Pulse: 100   Resp: 16   Temp: 98.4 F (36.9 C)   SpO2: 97%   Weight:  (!) 161.8 kg  Height:  5' 2 (1.575 m)    Constitutional: NAD, calm, comfortable Vitals:   10/25/24 1119 10/25/24 1245  BP: 124/77   Pulse: 100   Resp: 16   Temp: 98.4 F (36.9 C)   SpO2: 97%   Weight:  (!) 161.8 kg  Height:  5' 2 (1.575 m)   Eyes: PERRL, lids and conjunctivae normal ENMT: Mucous membranes are moist. Posterior pharynx clear of any exudate or lesions.Normal dentition.  Neck: normal, supple, no masses, no thyromegaly Respiratory: clear to auscultation bilaterally, no wheezing, no crackles. Normal respiratory effort. No accessory muscle use.  Cardiovascular: Regular rate and rhythm, no murmurs / rubs / gallops. No extremity edema. 2+ pedal pulses. No carotid bruits.  Abdomen: no  tenderness, no masses palpated. No hepatosplenomegaly. Bowel sounds positive.  Musculoskeletal: no clubbing / cyanosis. No joint deformity upper and lower extremities. Good ROM, no contractures. Normal muscle tone.  Skin: no rashes, lesions, ulcers. No induration Neurologic: CN 2-12 grossly intact. Sensation intact, DTR normal. Strength 5/5 in all 4.  Psychiatric: Normal judgment and insight. Alert and oriented x 3. Normal mood.    Labs on Admission: I have personally reviewed following labs and imaging studies  CBC: Recent Labs  Lab 10/25/24 1121  WBC 14.1*  HGB 14.6  HCT 44.5  MCV 84.8  PLT 257   Basic Metabolic Panel: Recent Labs  Lab 10/25/24 1121  NA 133*  K 3.8  CL 95*  CO2 23  GLUCOSE 308*  BUN 7  CREATININE 1.03*  CALCIUM  9.7   GFR: Estimated Creatinine Clearance: 91.3 mL/min (A) (by C-G formula based on SCr of 1.03 mg/dL (H)). Liver Function Tests: No results for input(s): AST, ALT, ALKPHOS, BILITOT, PROT, ALBUMIN in the last 168 hours. No results for input(s): LIPASE, AMYLASE in the last 168 hours. No results for input(s): AMMONIA in the last 168 hours. Coagulation Profile: No results for input(s): INR, PROTIME in the last 168 hours. Cardiac Enzymes: No results for input(s): CKTOTAL, CKMB, CKMBINDEX, TROPONINI in the last 168 hours. BNP (last 3 results) No results for input(s): PROBNP in the last 8760 hours. HbA1C: No results for input(s): HGBA1C in the last 72 hours. CBG: No results for input(s): GLUCAP in the last 168 hours. Lipid Profile: No results for input(s): CHOL, HDL, LDLCALC, TRIG, CHOLHDL, LDLDIRECT in the last 72 hours. Thyroid  Function Tests: No results for input(s): TSH, T4TOTAL, FREET4, T3FREE, THYROIDAB in the last 72 hours. Anemia Panel: No results for input(s): VITAMINB12, FOLATE, FERRITIN, TIBC, IRON, RETICCTPCT in the last 72 hours. Urine analysis:    Component  Value Date/Time   COLORURINE AMBER (A) 10/25/2024 1121   APPEARANCEUR TURBID (A) 10/25/2024 1121   LABSPEC 1.009 10/25/2024 1121   PHURINE 6.0 10/25/2024 1121   GLUCOSEU >=500 (A) 10/25/2024 1121   HGBUR MODERATE (A) 10/25/2024 1121   BILIRUBINUR NEGATIVE 10/25/2024 1121   BILIRUBINUR Negative 08/06/2023 1359   KETONESUR NEGATIVE 10/25/2024 1121   PROTEINUR 100 (A) 10/25/2024 1121  UROBILINOGEN 1.0 08/06/2023 1359   NITRITE NEGATIVE 10/25/2024 1121   LEUKOCYTESUR LARGE (A) 10/25/2024 1121    Radiological Exams on Admission: CT ABDOMEN PELVIS W CONTRAST Result Date: 10/25/2024 CLINICAL DATA:  Recurrent UTI. EXAM: CT ABDOMEN AND PELVIS WITH CONTRAST TECHNIQUE: Multidetector CT imaging of the abdomen and pelvis was performed using the standard protocol following bolus administration of intravenous contrast. RADIATION DOSE REDUCTION: This exam was performed according to the departmental dose-optimization program which includes automated exposure control, adjustment of the mA and/or kV according to patient size and/or use of iterative reconstruction technique. CONTRAST:  OMNIPAQUE  IOHEXOL  300 MG/ML  SOLN COMPARISON:  CT abdomen pelvis dated 09/25/2024. FINDINGS: Lower chest: The visualized lung bases are clear. No intra-abdominal free air or free fluid. Hepatobiliary: Fatty liver. No biliary duct dilatation. The gallbladder is unremarkable. Pancreas: Unremarkable. No pancreatic ductal dilatation or surrounding inflammatory changes. Spleen: Normal in size without focal abnormality. Adrenals/Urinary Tract: The adrenal glands are unremarkable. There is no hydronephrosis on either side. There is heterogeneous hypoenhancement of the superior poles of the kidneys, left greater than right, most consistent with pyelonephritis. No abscess. The visualized ureters appear unremarkable. Mild diffuse thickened appearance of the bladder wall with urothelial enhancement consistent with UTI. Stomach/Bowel: There  is sigmoid diverticulosis and scattered colonic diverticula. There is no bowel obstruction or active inflammation. The appendix is normal. Vascular/Lymphatic: Mild atherosclerotic calcification of the abdominal aorta. The IVC is unremarkable. No portal venous gas. There is no adenopathy. Reproductive: The uterus is anteverted. Thickened appearance of the endometrium measuring 15 mm in thickness. Further evaluation with pelvic ultrasound on a nonemergent/outpatient basis recommended. No suspicious adnexal masses. Other: Fat containing umbilical hernia. Musculoskeletal: Bilateral sacroiliitis. No acute osseous pathology. IMPRESSION: 1. Cystitis and bilateral pyelonephritis. No abscess. 2. Fatty liver. 3. Colonic diverticulosis. No bowel obstruction. Normal appendix. 4. Thickened appearance of the endometrium. Further evaluation with pelvic ultrasound on a nonemergent/outpatient basis recommended. 5.  Aortic Atherosclerosis (ICD10-I70.0). Electronically Signed   By: Vanetta Chou M.D.   On: 10/25/2024 13:25    EKG: None  Assessment/Plan Active Problems:   Pyelonephritis   UTI (urinary tract infection)  (please populate well all problems here in Problem List. (For example, if patient is on BP meds at home and you resume or decide to hold them, it is a problem that needs to be her. Same for CAD, COPD, HLD and so on)  Bilateral pyelonephritis Complicated UTI with ascending infection -Likely related to Jardiance , recommend discontinue Jardiance , patient agreed. - Review of most recent urine culture back in October showed pansensitive E. coli.  Continue ceftriaxone  for now.  IIDM - Hold metformin , as she received IV contrast this morning - Discontinue Jardiance  due to frequent UTIs - SSI for now - Outpatient follow-up with endocrinology  HTN Chronic HFpEF -Euvolemic, continue hydrochlorothiazide  -Add losartan  for kidney protection  Anxiety/depression -Stable  Morbid obesity -BMI>  60 -Patient already on Ozempic     DVT prophylaxis: Lovenox  Code Status: Full code Family Communication: Daughter at bedside Disposition Plan: Expect less than 2 midnight hospital stay Consults called: None Admission status: MedSurg OBS   Cort ONEIDA Mana MD [facility] Pager [phone] 10/25/2024, 2:29 PM        [1]  Allergies Allergen Reactions   Cymbalta  [Duloxetine  Hcl]     SI thoughts   "

## 2024-10-26 DIAGNOSIS — I5032 Chronic diastolic (congestive) heart failure: Secondary | ICD-10-CM | POA: Diagnosis present

## 2024-10-26 DIAGNOSIS — N12 Tubulo-interstitial nephritis, not specified as acute or chronic: Secondary | ICD-10-CM | POA: Diagnosis not present

## 2024-10-26 DIAGNOSIS — M4807 Spinal stenosis, lumbosacral region: Secondary | ICD-10-CM | POA: Diagnosis present

## 2024-10-26 DIAGNOSIS — Z8249 Family history of ischemic heart disease and other diseases of the circulatory system: Secondary | ICD-10-CM | POA: Diagnosis not present

## 2024-10-26 DIAGNOSIS — E1169 Type 2 diabetes mellitus with other specified complication: Secondary | ICD-10-CM | POA: Diagnosis not present

## 2024-10-26 DIAGNOSIS — Z7984 Long term (current) use of oral hypoglycemic drugs: Secondary | ICD-10-CM | POA: Diagnosis not present

## 2024-10-26 DIAGNOSIS — F419 Anxiety disorder, unspecified: Secondary | ICD-10-CM | POA: Diagnosis present

## 2024-10-26 DIAGNOSIS — N3000 Acute cystitis without hematuria: Secondary | ICD-10-CM | POA: Diagnosis not present

## 2024-10-26 DIAGNOSIS — Z801 Family history of malignant neoplasm of trachea, bronchus and lung: Secondary | ICD-10-CM | POA: Diagnosis not present

## 2024-10-26 DIAGNOSIS — I110 Hypertensive heart disease with heart failure: Secondary | ICD-10-CM | POA: Diagnosis present

## 2024-10-26 DIAGNOSIS — R112 Nausea with vomiting, unspecified: Secondary | ICD-10-CM | POA: Diagnosis present

## 2024-10-26 DIAGNOSIS — M1990 Unspecified osteoarthritis, unspecified site: Secondary | ICD-10-CM | POA: Diagnosis present

## 2024-10-26 DIAGNOSIS — Z833 Family history of diabetes mellitus: Secondary | ICD-10-CM | POA: Diagnosis not present

## 2024-10-26 DIAGNOSIS — J4489 Other specified chronic obstructive pulmonary disease: Secondary | ICD-10-CM | POA: Diagnosis present

## 2024-10-26 DIAGNOSIS — B9620 Unspecified Escherichia coli [E. coli] as the cause of diseases classified elsewhere: Secondary | ICD-10-CM | POA: Diagnosis present

## 2024-10-26 DIAGNOSIS — Z7985 Long-term (current) use of injectable non-insulin antidiabetic drugs: Secondary | ICD-10-CM | POA: Diagnosis not present

## 2024-10-26 DIAGNOSIS — Z6841 Body Mass Index (BMI) 40.0 and over, adult: Secondary | ICD-10-CM | POA: Diagnosis not present

## 2024-10-26 DIAGNOSIS — Z8701 Personal history of pneumonia (recurrent): Secondary | ICD-10-CM | POA: Diagnosis not present

## 2024-10-26 DIAGNOSIS — N10 Acute tubulo-interstitial nephritis: Secondary | ICD-10-CM | POA: Diagnosis present

## 2024-10-26 DIAGNOSIS — Z825 Family history of asthma and other chronic lower respiratory diseases: Secondary | ICD-10-CM | POA: Diagnosis not present

## 2024-10-26 DIAGNOSIS — Z8616 Personal history of COVID-19: Secondary | ICD-10-CM | POA: Diagnosis not present

## 2024-10-26 DIAGNOSIS — E785 Hyperlipidemia, unspecified: Secondary | ICD-10-CM | POA: Diagnosis present

## 2024-10-26 DIAGNOSIS — K760 Fatty (change of) liver, not elsewhere classified: Secondary | ICD-10-CM | POA: Diagnosis present

## 2024-10-26 DIAGNOSIS — M51369 Other intervertebral disc degeneration, lumbar region without mention of lumbar back pain or lower extremity pain: Secondary | ICD-10-CM | POA: Diagnosis present

## 2024-10-26 DIAGNOSIS — E119 Type 2 diabetes mellitus without complications: Secondary | ICD-10-CM | POA: Diagnosis present

## 2024-10-26 DIAGNOSIS — E118 Type 2 diabetes mellitus with unspecified complications: Secondary | ICD-10-CM | POA: Diagnosis not present

## 2024-10-26 DIAGNOSIS — Z87891 Personal history of nicotine dependence: Secondary | ICD-10-CM | POA: Diagnosis not present

## 2024-10-26 DIAGNOSIS — Z79899 Other long term (current) drug therapy: Secondary | ICD-10-CM | POA: Diagnosis not present

## 2024-10-26 LAB — BASIC METABOLIC PANEL WITH GFR
Anion gap: 10 (ref 5–15)
BUN: 8 mg/dL (ref 6–20)
CO2: 26 mmol/L (ref 22–32)
Calcium: 8.5 mg/dL — ABNORMAL LOW (ref 8.9–10.3)
Chloride: 97 mmol/L — ABNORMAL LOW (ref 98–111)
Creatinine, Ser: 0.94 mg/dL (ref 0.44–1.00)
GFR, Estimated: >60 mL/min
Glucose, Bld: 209 mg/dL — ABNORMAL HIGH (ref 70–99)
Potassium: 4 mmol/L (ref 3.5–5.1)
Sodium: 133 mmol/L — ABNORMAL LOW (ref 135–145)

## 2024-10-26 LAB — BLOOD CULTURE ID PANEL (REFLEXED) - BCID2

## 2024-10-26 LAB — CBC
HCT: 39.5 % (ref 36.0–46.0)
Hemoglobin: 12.8 g/dL (ref 12.0–15.0)
MCH: 27.7 pg (ref 26.0–34.0)
MCHC: 32.4 g/dL (ref 30.0–36.0)
MCV: 85.5 fL (ref 80.0–100.0)
Platelets: 209 K/uL (ref 150–400)
RBC: 4.62 MIL/uL (ref 3.87–5.11)
RDW: 14.5 % (ref 11.5–15.5)
WBC: 11.5 K/uL — ABNORMAL HIGH (ref 4.0–10.5)
nRBC: 0 % (ref 0.0–0.2)

## 2024-10-26 LAB — GLUCOSE, CAPILLARY
Glucose-Capillary: 228 mg/dL — ABNORMAL HIGH (ref 70–99)
Glucose-Capillary: 191 mg/dL — ABNORMAL HIGH (ref 70–99)
Glucose-Capillary: 181 mg/dL — ABNORMAL HIGH (ref 70–99)
Glucose-Capillary: 189 mg/dL — ABNORMAL HIGH (ref 70–99)

## 2024-10-26 MED ORDER — METOCLOPRAMIDE HCL 5 MG/ML IJ SOLN
5.0000 mg | Freq: Four times a day (QID) | INTRAMUSCULAR | Status: DC | PRN
  Administered 2024-10-26: 5 mg via INTRAVENOUS
  Filled 2024-10-26: qty 2

## 2024-10-26 MED ORDER — METOCLOPRAMIDE HCL 5 MG PO TABS: 5.0000 mg | ORAL_TABLET | Freq: Four times a day (QID) | ORAL | Status: DC | PRN

## 2024-10-26 MED ORDER — POLYETHYLENE GLYCOL 3350 17 G PO PACK
17.0000 g | PACK | Freq: Every day | ORAL | Status: DC
  Administered 2024-10-26 – 2024-10-31 (×3): 17 g via ORAL
  Filled 2024-10-26 (×6): qty 1

## 2024-10-26 NOTE — TOC CM/SW Note (Signed)
 Transition of Care [facility]) - Inpatient Brief Assessment   Patient Details  Name: Jasmine Larsen MRN: [id number] Date of Birth: Mar 11, 1966  [facility] of [facility]) CM/SW Contact:    Corean ONEIDA Haddock, RN Phone Number: 10/26/2024, 9:33 AM   Clinical Narrative:  Transition of Care Department [facility]) has reviewed patient and no TOC needs have been identified at this time.  If new patient transition needs arise, please place a TOC consult.   Transition of Care Asessment: Insurance and Status: Insurance coverage has been reviewed Patient has primary care physician: Yes     Prior/Current Home Services: No current home services Social Drivers of Health Review: SDOH reviewed no interventions necessary Readmission risk has been reviewed: No (obs status no score generated) Transition of care needs: no transition of care needs at this time

## 2024-10-26 NOTE — Progress Notes (Signed)
 PHARMACY - PHYSICIAN COMMUNICATION CRITICAL VALUE ALERT - BLOOD CULTURE IDENTIFICATION (BCID)  Jasmine Larsen is an 59 y.o. female who presented to [facility] on 10/25/2024 with a chief complaint of dysuria, flank pain, chills  Assessment:  Blood cultures from 1/13 with GPC in 1 of 4 bottles, BCID detects methicillin susceptible S. Epidermidis.  SHe is on ceftriaxone  for UTI/pyelonephritis.  Urine cx has E coli   Name of physician (or Provider) Contacted: Dr Cosette  Current antibiotics: Ceftriaxone   Changes to prescribed antibiotics recommended:  Patient is on recommended antibiotics - No changes needed. Suspected contaminant, monitor on current therapy  Results for orders placed or performed during the hospital encounter of 10/25/24  Blood Culture ID Panel (Reflexed) (Collected: 10/25/2024  3:05 PM)  Result Value Ref Range   Enterococcus faecalis NOT DETECTED NOT DETECTED   Enterococcus Faecium NOT DETECTED NOT DETECTED   Listeria monocytogenes NOT DETECTED NOT DETECTED   Staphylococcus species DETECTED (A) NOT DETECTED   Staphylococcus aureus (BCID) NOT DETECTED NOT DETECTED   Staphylococcus epidermidis DETECTED (A) NOT DETECTED   Staphylococcus lugdunensis NOT DETECTED NOT DETECTED   Streptococcus species NOT DETECTED NOT DETECTED   Streptococcus agalactiae NOT DETECTED NOT DETECTED   Streptococcus pneumoniae NOT DETECTED NOT DETECTED   Streptococcus pyogenes NOT DETECTED NOT DETECTED   A.calcoaceticus-baumannii NOT DETECTED NOT DETECTED   Bacteroides fragilis NOT DETECTED NOT DETECTED   Enterobacterales NOT DETECTED NOT DETECTED   Enterobacter cloacae complex NOT DETECTED NOT DETECTED   Escherichia coli NOT DETECTED NOT DETECTED   Klebsiella aerogenes NOT DETECTED NOT DETECTED   Klebsiella oxytoca NOT DETECTED NOT DETECTED   Klebsiella pneumoniae NOT DETECTED NOT DETECTED   Proteus species NOT DETECTED NOT DETECTED   Salmonella species NOT DETECTED NOT DETECTED   Serratia  marcescens NOT DETECTED NOT DETECTED   Haemophilus influenzae NOT DETECTED NOT DETECTED   Neisseria meningitidis NOT DETECTED NOT DETECTED   Pseudomonas aeruginosa NOT DETECTED NOT DETECTED   Stenotrophomonas maltophilia NOT DETECTED NOT DETECTED   Candida albicans NOT DETECTED NOT DETECTED   Candida auris NOT DETECTED NOT DETECTED   Candida glabrata NOT DETECTED NOT DETECTED   Candida krusei NOT DETECTED NOT DETECTED   Candida parapsilosis NOT DETECTED NOT DETECTED   Candida tropicalis NOT DETECTED NOT DETECTED   Cryptococcus neoformans/gattii NOT DETECTED NOT DETECTED   Methicillin resistance mecA/C NOT DETECTED NOT DETECTED    Celestine Slovak, PharmD, BCPS, BCIDP Work Cell: [phone] 10/26/2024 2:24 PM

## 2024-10-26 NOTE — Progress Notes (Signed)
 SPIRITUAL CARE AND COUNSELING CONSULT NOTE   VISIT SUMMARY Chaplain provided spiritual/emotional support to Jasmine Larsen while rounding on unit. Chaplain consulted with nurse team.   ZELPHIA GUILE                                                                                                                                                                      Type of Visit: Initial Care provided to:: Patient Conversation partners present during encounter: Nurse Referral source: Chaplain assessment Reason for visit: Routine spiritual support OnCall Visit: No   SPIRITUAL FRAMEWORK  Presenting Themes: Values and beliefs, Goals in life/care Community/Connection: Family, Faith community Strengths: family/community support, perspective Patient Stress Factors: Health changes, Other (Comment) (2 witnesses present for HCPOA/AD Notary) Family Stress Factors: Health changes, Other (Comment) (2 witnesses present for HCPOA/AD Notary)   GOALS   Self/Personal Goals: healing, get HCPOA information notarized Clinical Care Goals: healing, get HCPOA information notarized   INTERVENTIONS   Spiritual Care Interventions Made: Compassionate presence, Prayer, Explored values/beliefs/practices/strengths, Established relationship of care and support    INTERVENTION OUTCOMES   Outcomes: Awareness around self/spiritual resourses, Patient family open to resources, Connection to values and goals of care  Chaplain provided compassionate presence and open ended questions to elicit Jasmine Larsen's feelings about current health status, treatment plan, local family support, [employer] faith background, and desires for HCPOA notary's. Chaplain provided prayer upon request.   SPIRITUAL CARE PLAN   Spiritual Care Issues Still Outstanding: Chaplain will continue to follow    If immediate needs arise, please contact [facility] 24 hour on call [phone]   Barabara Chess, Chaplain  10/26/2024 4:20 PM

## 2024-10-26 NOTE — Progress Notes (Signed)
 " PROGRESS NOTE    Jasmine Larsen  FMW:[id number] DOB: [birth year] DOA: 10/25/2024 PCP: [name], MD  Subjective: No acute events overnight. Seen and examined at bedside. Reports ongoing bilateral flank pain. Tolerating oral intake without n/v. Denies constipation.   Hospital Course:  59 y.o. female with medical history significant of IIDM, morbid obesity, HTN, chronic HFpEF, asthma/COPD, recently diagnosed pyelonephritis, presented with recurrent urinary symptoms including dysuria, urinary frequency and bilateral back pain.   Symptoms started 2 to 3 days ago when patient started to have urinary frequency associate with burning sensation urinary.  Yesterday she started with bilateral back pain, associated with episode chills but no fever.  The symptoms were very similar to what she had during another process of pyelonephritis/UTI last month.  She is still taking Jardiance .   Assessment and Plan:  Acute recurrent bilateral pyelonephritis - Likely related to Jardiance , discontinued Jardiance  again - Review of most recent urine culture back in October showed pansensitive E. coli.   - Continue ceftriaxone  for now. - follow up blood and urine cultures - will need repeat CT A/P if not improving in the next 48-72 hours   IIDM - Hold metformin , as she received IV contrast this morning - Discontinue Jardiance  due to frequent UTIs - SSI for now - Outpatient follow-up with endocrinology   HTN Chronic HFpEF -Euvolemic, continue hydrochlorothiazide  -Add losartan  for kidney protection   Anxiety/depression -Stable   Morbid obesity -BMI> 60 -Patient already on Ozempic   DVT prophylaxis:   Lovenox    Code Status: Full Code  Disposition Plan: TBD Reason for continuing need for hospitalization: IV antibiotics, monitor for clinical improvement  Objective: Vitals:   10/25/24 2100 10/25/24 2125 10/26/24 0400 10/26/24 0715  BP:   (!) 152/79 117/61  Pulse:   (!) 101 93  Resp:   18 15   Temp:   98.6 F (37 C) 98.3 F (36.8 C)  TempSrc:  Oral Oral   SpO2:   98% 95%  Weight: (!) 156 kg     Height: 5' 2 (1.575 m)       Intake/Output Summary (Last 24 hours) at 10/26/2024 1144 Last data filed at 10/26/2024 0900 Gross per 24 hour  Intake 2180 ml  Output --  Net 2180 ml   Filed Weights   10/25/24 1245 10/25/24 2100  Weight: (!) 161.8 kg (!) 156 kg    Examination:  Physical Exam Vitals and nursing note reviewed.  Constitutional:      General: She is not in acute distress.    Appearance: She is obese. She is ill-appearing.  HENT:     Head: Normocephalic and atraumatic.  Cardiovascular:     Rate and Rhythm: Normal rate and regular rhythm.     Pulses: Normal pulses.     Heart sounds: Normal heart sounds.  Pulmonary:     Effort: Pulmonary effort is normal.     Breath sounds: Normal breath sounds.  Abdominal:     General: Bowel sounds are normal.     Palpations: Abdomen is soft.  Neurological:     Mental Status: She is alert.     Data Reviewed: I have personally reviewed following labs and imaging studies  CBC: Recent Labs  Lab 10/25/24 1121 10/26/24 0418  WBC 14.1* 11.5*  HGB 14.6 12.8  HCT 44.5 39.5  MCV 84.8 85.5  PLT 257 209   Basic Metabolic Panel: Recent Labs  Lab 10/25/24 1121 10/26/24 0418  NA 133* 133*  K 3.8 4.0  CL  95* 97*  CO2 23 26  GLUCOSE 308* 209*  BUN 7 8  CREATININE 1.03* 0.94  CALCIUM  9.7 8.5*   GFR: Estimated Creatinine Clearance: 97.6 mL/min (by C-G formula based on SCr of 0.94 mg/dL). Liver Function Tests: No results for input(s): AST, ALT, ALKPHOS, BILITOT, PROT, ALBUMIN in the last 168 hours. No results for input(s): LIPASE, AMYLASE in the last 168 hours. No results for input(s): AMMONIA in the last 168 hours. Coagulation Profile: No results for input(s): INR, PROTIME in the last 168 hours. Cardiac Enzymes: No results for input(s): CKTOTAL, CKMB, CKMBINDEX, TROPONINI in the  last 168 hours. ProBNP, BNP (last 5 results) No results for input(s): PROBNP, BNP in the last 8760 hours. HbA1C: No results for input(s): HGBA1C in the last 72 hours. CBG: Recent Labs  Lab 10/25/24 1554 10/25/24 2059 10/26/24 0718  GLUCAP 178* 194* 228*   Lipid Profile: No results for input(s): CHOL, HDL, LDLCALC, TRIG, CHOLHDL, LDLDIRECT in the last 72 hours. Thyroid  Function Tests: No results for input(s): TSH, T4TOTAL, FREET4, T3FREE, THYROIDAB in the last 72 hours. Anemia Panel: No results for input(s): VITAMINB12, FOLATE, FERRITIN, TIBC, IRON, RETICCTPCT in the last 72 hours. Sepsis Labs: Recent Labs  Lab 10/25/24 1500 10/25/24 1634  LATICACIDVEN 1.9 1.2    Recent Results (from the past 240 hours)  Culture, blood (routine x 2)     Status: None (Preliminary result)   Collection Time: 10/25/24  3:00 PM   Specimen: BLOOD  Result Value Ref Range Status   Specimen Description BLOOD LEFT ANTECUBITAL  Final   Special Requests   Final    BOTTLES DRAWN AEROBIC AND ANAEROBIC Blood Culture results may not be optimal due to an inadequate volume of blood received in culture bottles   Culture   Final    NO GROWTH < 24 HOURS Performed at [facility], [address] [place], [place] [address]    Report Status PENDING  Incomplete  Culture, blood (routine x 2)     Status: None (Preliminary result)   Collection Time: 10/25/24  3:05 PM   Specimen: BLOOD  Result Value Ref Range Status   Specimen Description BLOOD RIGHT ANTECUBITAL  Final   Special Requests   Final    BOTTLES DRAWN AEROBIC AND ANAEROBIC Blood Culture results may not be optimal due to an inadequate volume of blood received in culture bottles   Culture   Final    NO GROWTH < 24 HOURS Performed at [facility], [address] [place], [place] [address]    Report Status PENDING  Incomplete     Radiology Studies: CT ABDOMEN PELVIS W CONTRAST Result Date:  10/25/2024 CLINICAL DATA:  Recurrent UTI. EXAM: CT ABDOMEN AND PELVIS WITH CONTRAST TECHNIQUE: Multidetector CT imaging of the abdomen and pelvis was performed using the standard protocol following bolus administration of intravenous contrast. RADIATION DOSE REDUCTION: This exam was performed according to the departmental dose-optimization program which includes automated exposure control, adjustment of the mA and/or kV according to patient size and/or use of iterative reconstruction technique. CONTRAST:  OMNIPAQUE  IOHEXOL  300 MG/ML  SOLN COMPARISON:  CT abdomen pelvis dated 09/25/2024. FINDINGS: Lower chest: The visualized lung bases are clear. No intra-abdominal free air or free fluid. Hepatobiliary: Fatty liver. No biliary duct dilatation. The gallbladder is unremarkable. Pancreas: Unremarkable. No pancreatic ductal dilatation or surrounding inflammatory changes. Spleen: Normal in size without focal abnormality. Adrenals/Urinary Tract: The adrenal glands are unremarkable. There is no hydronephrosis on either side. There  is heterogeneous hypoenhancement of the superior poles of the kidneys, left greater than right, most consistent with pyelonephritis. No abscess. The visualized ureters appear unremarkable. Mild diffuse thickened appearance of the bladder wall with urothelial enhancement consistent with UTI. Stomach/Bowel: There is sigmoid diverticulosis and scattered colonic diverticula. There is no bowel obstruction or active inflammation. The appendix is normal. Vascular/Lymphatic: Mild atherosclerotic calcification of the abdominal aorta. The IVC is unremarkable. No portal venous gas. There is no adenopathy. Reproductive: The uterus is anteverted. Thickened appearance of the endometrium measuring 15 mm in thickness. Further evaluation with pelvic ultrasound on a nonemergent/outpatient basis recommended. No suspicious adnexal masses. Other: Fat containing umbilical hernia. Musculoskeletal: Bilateral  sacroiliitis. No acute osseous pathology. IMPRESSION: 1. Cystitis and bilateral pyelonephritis. No abscess. 2. Fatty liver. 3. Colonic diverticulosis. No bowel obstruction. Normal appendix. 4. Thickened appearance of the endometrium. Further evaluation with pelvic ultrasound on a nonemergent/outpatient basis recommended. 5.  Aortic Atherosclerosis (ICD10-I70.0). Electronically Signed   By: Vanetta Chou M.D.   On: 10/25/2024 13:25    Scheduled Meds:  budesonide -glycopyrrolate -formoterol   2 puff Inhalation BID   enoxaparin  (LOVENOX ) injection  80 mg Subcutaneous QHS   hydrochlorothiazide   12.5 mg Oral Daily   insulin  aspart  0-20 Units Subcutaneous TID WC   insulin  aspart  0-5 Units Subcutaneous QHS   losartan   25 mg Oral Daily   rOPINIRole   1 mg Oral QHS   rosuvastatin   20 mg Oral Daily   Continuous Infusions:  cefTRIAXone  (ROCEPHIN )  IV 200 mL/hr at 10/25/24 1711     LOS: 0 days   Norval Bar, MD  [facility]  10/26/2024, 11:44 AM   "

## 2024-10-26 NOTE — Plan of Care (Signed)

## 2024-10-27 ENCOUNTER — Encounter: Payer: Self-pay | Admitting: Podiatry

## 2024-10-27 DIAGNOSIS — N12 Tubulo-interstitial nephritis, not specified as acute or chronic: Secondary | ICD-10-CM

## 2024-10-27 DIAGNOSIS — N3000 Acute cystitis without hematuria: Secondary | ICD-10-CM

## 2024-10-27 DIAGNOSIS — E785 Hyperlipidemia, unspecified: Secondary | ICD-10-CM

## 2024-10-27 DIAGNOSIS — E118 Type 2 diabetes mellitus with unspecified complications: Secondary | ICD-10-CM | POA: Diagnosis not present

## 2024-10-27 DIAGNOSIS — E1169 Type 2 diabetes mellitus with other specified complication: Secondary | ICD-10-CM | POA: Diagnosis not present

## 2024-10-27 LAB — BASIC METABOLIC PANEL WITH GFR
Anion gap: 12 (ref 5–15)
BUN: 9 mg/dL (ref 6–20)
CO2: 26 mmol/L (ref 22–32)
Calcium: 8.7 mg/dL — ABNORMAL LOW (ref 8.9–10.3)
Chloride: 95 mmol/L — ABNORMAL LOW (ref 98–111)
Creatinine, Ser: 0.83 mg/dL (ref 0.44–1.00)
GFR, Estimated: >60 mL/min
Glucose, Bld: 170 mg/dL — ABNORMAL HIGH (ref 70–99)
Potassium: 3.2 mmol/L — ABNORMAL LOW (ref 3.5–5.1)
Sodium: 133 mmol/L — ABNORMAL LOW (ref 135–145)

## 2024-10-27 LAB — URINE CULTURE: Culture: >=100000 — AB

## 2024-10-27 LAB — GLUCOSE, CAPILLARY
Glucose-Capillary: 215 mg/dL — ABNORMAL HIGH (ref 70–99)
Glucose-Capillary: 164 mg/dL — ABNORMAL HIGH (ref 70–99)
Glucose-Capillary: 234 mg/dL — ABNORMAL HIGH (ref 70–99)
Glucose-Capillary: 95 mg/dL (ref 70–99)

## 2024-10-27 MED ORDER — POTASSIUM CHLORIDE CRYS ER 20 MEQ PO TBCR
40.0000 meq | EXTENDED_RELEASE_TABLET | Freq: Once | ORAL | Status: AC
  Administered 2024-10-27: 40 meq via ORAL
  Filled 2024-10-27: qty 2

## 2024-10-27 NOTE — Progress Notes (Signed)
Letter generated for pt.

## 2024-10-27 NOTE — Progress Notes (Signed)
 SPIRITUAL CARE AND COUNSELING CONSULT NOTE   VISIT SUMMARY  Pt has received a copy of HCPOA and states they have an outside notary and witnesses who can help them complete  SPIRITUAL ENCOUNTER                                                                                                                                                                      Type of Visit: Initial Care provided to:: Pt and family Referral source: Chaplain assessment Reason for visit: Routine spiritual support   SPIRITUAL FRAMEWORK  Patient Stress Factors: Other (Comment) (Chaplain provided copy of HCPOA)   GOALS       INTERVENTIONS   Spiritual Care Interventions Made: Established relationship of care and support, Compassionate presence    INTERVENTION OUTCOMES      SPIRITUAL CARE PLAN   Spiritual Care Issues Still Outstanding: No further spiritual care needs at this time (see row info)    If immediate needs arise, please contact [facility] 24 hour on call [phone]   Sari Hugh, Chaplain  10/27/2024 3:32 PM

## 2024-10-27 NOTE — Progress Notes (Signed)
 [facility]  - [place] at [facility] [address]   PATIENT NAME: Jasmine Larsen    MR#:  [id number]  DATE OF BIRTH:  April 16, 1966  SUBJECTIVE:  no family at bedside. Patient overall feeling a lot better. No fever. Discussed about diabetes management and deferring to outpatient endocrinology. Daughter is working on getting referral    VITALS:  Blood pressure 102/67, pulse 90, temperature 98.4 F (36.9 C), temperature source Oral, resp. rate 16, height 5' 2 (1.575 m), weight (!) 156 kg, SpO2 96%.  PHYSICAL EXAMINATION:   GENERAL:  59 y.o.-year-old patient with no acute distress. Morbid obesity LUNGS: Normal breath sounds bilaterally, no wheezing CARDIOVASCULAR: S1, S2 normal. No murmur   ABDOMEN: Soft, nontender, nondistended. Bowel sounds present.  EXTREMITIES: No  edema b/l.    NEUROLOGIC: nonfocal  patient is alert and awake  LABORATORY PANEL:  CBC Recent Labs  Lab 10/26/24 0418  WBC 11.5*  HGB 12.8  HCT 39.5  PLT 209    Chemistries  Recent Labs  Lab 10/27/24 0546  NA 133*  K 3.2*  CL 95*  CO2 26  GLUCOSE 170*  BUN 9  CREATININE 0.83  CALCIUM  8.7*    Assessment and Plan From H and P  59 y.o. female with medical history significant of IIDM, morbid obesity, HTN, chronic HFpEF, asthma/COPD, recently diagnosed pyelonephritis, presented with recurrent urinary symptoms including dysuria, urinary frequency and bilateral back pain.   Symptoms started 2 to 3 days ago when patient started to have urinary frequency associate with burning sensation urinary.  Yesterday she started with bilateral back pain, associated with episode chills but no fever.  The symptoms were very similar to what she had during another process of pyelonephritis/UTI last month.  She is still taking Jardiance .     Acute recurrent bilateral pyelonephritis - Likely related to Jardiance , discontinued Jardiance  again--pt made aware - Review of most recent urine culture back in October showed  pansensitive E. coli.   - Continue ceftriaxone  for now. - follow up blood culture 1/2 Staph species--appears contamination --urine culture E coli   IIDM - Hold metformin , as she received IV contrast this morning - Discontinue Jardiance  due to frequent UTIs - SSI for now - Outpatient follow-up with endocrinology   HTN Chronic HFpEF -Euvolemic, continue hydrochlorothiazide  -Add losartan  for kidney protection   Anxiety/depression -Stable   Morbid obesity -BMI> 60 -Patient already on Ozempic     Procedures: Family communication :none today Consults :none CODE STATUS: full code DVT Prophylaxis : Level of care: Med-Surg Status is: Inpatient Remains inpatient appropriate because: treating for resistant E coli UTI    TOTAL TIME TAKING CARE OF THIS PATIENT: 40 minutes.  >50% time spent on counselling and coordination of care  Note: This dictation was prepared with Dragon dictation along with smaller phrase technology. Any transcriptional errors that result from this process are unintentional.  Leita Blanch M.D    [facility]   CC: Primary care physician; Bair, Kalpana, MD

## 2024-10-27 NOTE — Progress Notes (Signed)
"  °  Subjective:  Patient ID: Jasmine Larsen, female    DOB: Oct 20, 1965,  MRN: [id number]  JOYCE HEITMAN presents to clinic today for at risk foot care with history of diabetic neuropathy and callus(es) of both feet and painful mycotic toenails that are difficult to trim. Painful toenails interfere with ambulation. Aggravating factors include wearing enclosed shoe gear. Pain is relieved with periodic professional debridement. Painful calluses are aggravated when weightbearing with and without shoegear. Pain is relieved with periodic professional debridement.  Chief Complaint  Patient presents with   Nail Problem    Thick painful toenails, 3 month follow up    Diabetes    A1C 8.0   New problem(s): None.   PCP is Bair, Luke, MD.  Allergies[1]  Review of Systems: Negative except as noted in the HPI.  Objective: No changes noted in today's physical examination. There were no vitals filed for this visit. CAROLLEE NUSSBAUMER is a pleasant 59 y.o. female morbidly obese in NAD. AAO x 3.  Vascular Examination: CFT <3 seconds b/l. DP pulses faintly palpable b/l. PT pulses nonpalpable b/l. Digital hair absent. Skin temperature gradient warm to warm b/l. No pain with calf compression. No ischemia or gangrene. No cyanosis or clubbing noted b/l. Trace edema noted BLE.   Neurological Examination: Pt has subjective symptoms of neuropathy. Protective sensation diminished with 10g monofilament b/l. Vibratory sensation intact b/l.  Dermatological Examination: Pedal skin warm and supple b/l. No open wounds b/l. No interdigital macerations. Toenails 1-5 b/l thick, discolored, elongated with subungual debris and pain on dorsal palpation.  Hyperkeratotic lesion(s) medial IPJ of bilateral great toes.  No erythema, no edema, no drainage, no fluctuance.  Musculoskeletal Examination: Muscle strength 5/5 to all lower extremity muscle groups bilaterally. Pes planus deformity noted bilateral LE. Utilizes cane  for ambulation assistance.  Radiographs: None  Assessment/Plan: 1. Callus   2. Type 2 diabetes mellitus with diabetic neuropathy, without long-term current use of insulin  (HCC)   -Consent given for treatment as described below: -Examined patient. -Callus(es) medial IPJ of right great toe and plantar IPJ of left great toe pared utilizing sterile scalpel blade without complication or incident. Total number debrided =2. -As a courtesy, toenails 1-5 b/l were debrided in length and girth with sterile nail nippers and dremel file without incident. -Patient/POA to call should there be question/concern in the interim.   Return in about 3 months (around 01/18/2025).  Delon LITTIE Merlin, DPM      [facility] LOCATION: [address] [place] [address]                   Office [phone]   [facility] LOCATION: [address] [place], [place] [address] Office [phone]     [1]  Allergies Allergen Reactions   Cymbalta  [Duloxetine  Hcl]     SI thoughts   "

## 2024-10-28 DIAGNOSIS — N3000 Acute cystitis without hematuria: Secondary | ICD-10-CM | POA: Diagnosis not present

## 2024-10-28 DIAGNOSIS — E1169 Type 2 diabetes mellitus with other specified complication: Secondary | ICD-10-CM | POA: Diagnosis not present

## 2024-10-28 DIAGNOSIS — N12 Tubulo-interstitial nephritis, not specified as acute or chronic: Secondary | ICD-10-CM | POA: Diagnosis not present

## 2024-10-28 DIAGNOSIS — E785 Hyperlipidemia, unspecified: Secondary | ICD-10-CM | POA: Diagnosis not present

## 2024-10-28 DIAGNOSIS — E118 Type 2 diabetes mellitus with unspecified complications: Secondary | ICD-10-CM | POA: Diagnosis not present

## 2024-10-28 LAB — GLUCOSE, CAPILLARY
Glucose-Capillary: 172 mg/dL — ABNORMAL HIGH (ref 70–99)
Glucose-Capillary: 224 mg/dL — ABNORMAL HIGH (ref 70–99)
Glucose-Capillary: 191 mg/dL — ABNORMAL HIGH (ref 70–99)
Glucose-Capillary: 251 mg/dL — ABNORMAL HIGH (ref 70–99)

## 2024-10-28 LAB — BASIC METABOLIC PANEL WITH GFR
Anion gap: 12 (ref 5–15)
BUN: 11 mg/dL (ref 6–20)
CO2: 29 mmol/L (ref 22–32)
Calcium: 8.7 mg/dL — ABNORMAL LOW (ref 8.9–10.3)
Chloride: 95 mmol/L — ABNORMAL LOW (ref 98–111)
Creatinine, Ser: 0.76 mg/dL (ref 0.44–1.00)
GFR, Estimated: >60 mL/min
Glucose, Bld: 161 mg/dL — ABNORMAL HIGH (ref 70–99)
Potassium: 3.6 mmol/L (ref 3.5–5.1)
Sodium: 136 mmol/L (ref 135–145)

## 2024-10-28 MED ORDER — METFORMIN HCL 500 MG PO TABS
500.0000 mg | ORAL_TABLET | Freq: Two times a day (BID) | ORAL | Status: DC
  Administered 2024-10-28 – 2024-10-31 (×6): 500 mg via ORAL
  Filled 2024-10-28 (×6): qty 1

## 2024-10-28 MED ORDER — SPIRONOLACTONE 12.5 MG HALF TABLET
12.5000 mg | ORAL_TABLET | Freq: Every day | ORAL | Status: DC
  Administered 2024-10-28 – 2024-10-31 (×4): 12.5 mg via ORAL
  Filled 2024-10-28 (×4): qty 1

## 2024-10-28 MED ORDER — HYDROCODONE-ACETAMINOPHEN 5-325 MG PO TABS
1.0000 | ORAL_TABLET | Freq: Four times a day (QID) | ORAL | Status: DC | PRN
  Administered 2024-10-28 – 2024-10-30 (×6): 2 via ORAL
  Filled 2024-10-28 (×6): qty 2

## 2024-10-28 MED ORDER — BISACODYL 10 MG RE SUPP: 10.0000 mg | Freq: Every day | RECTAL | Status: DC | PRN

## 2024-10-28 NOTE — Progress Notes (Signed)
 [facility]  - [place] at [facility]   PATIENT NAME: Jasmine Larsen    MR#:  [id number]  DATE OF BIRTH:  [birth year]  SUBJECTIVE:  dter at bedside. Patient overall feeling a lot better. No fever. Pt informed about completing IV abxs given the Ecoli c/s after d/w with RPH constipation   VITALS:  Blood pressure (!) 157/90, pulse 80, temperature 97.7 F (36.5 C), resp. rate 16, height 5' 2 (1.575 m), weight (!) 156 kg, SpO2 95%.  PHYSICAL EXAMINATION:   GENERAL:  59 y.o.-year-old patient with no acute distress. Morbid obesity LUNGS: Normal breath sounds bilaterally, no wheezing CARDIOVASCULAR: S1, S2 normal. No murmur   ABDOMEN: Soft, nontender, nondistended. Bowel sounds present.  EXTREMITIES: No  edema b/l.    NEUROLOGIC: nonfocal  patient is alert and awake  LABORATORY PANEL:  CBC Recent Labs  Lab 10/26/24 0418  WBC 11.5*  HGB 12.8  HCT 39.5  PLT 209    Chemistries  Recent Labs  Lab 10/28/24 0529  NA 136  K 3.6  CL 95*  CO2 29  GLUCOSE 161*  BUN 11  CREATININE 0.76  CALCIUM  8.7*    Assessment and Plan From H and P  59 y.o. female with medical history significant of IIDM, morbid obesity, HTN, chronic HFpEF, asthma/COPD, recently diagnosed pyelonephritis, presented with recurrent urinary symptoms including dysuria, urinary frequency and bilateral back pain.   Symptoms started 2 to 3 days ago when patient started to have urinary frequency associate with burning sensation urinary.  Yesterday she started with bilateral back pain, associated with episode chills but no fever.  The symptoms were very similar to what she had during another process of pyelonephritis/UTI last month.  She is still taking Jardiance .     Acute recurrent bilateral pyelonephritis - Likely related to Jardiance , discontinued Jardiance  again--pt made aware - Review of most recent urine culture back in October showed pansensitive E. coli.   - Continue ceftriaxone  for  now--and completed 7 days IV ginve not much choice on c/s for oral in d/w ID RPH - follow up blood culture 1/2 Staph species--appears contamination --urine culture E coli   DM-2 - Hold metformin , as she received IV contrast this morning - Discontinue Jardiance  due to frequent UTIs - SSI for now - Outpatient follow-up with endocrinology --1/16--resume po metformin    HTN Chronic HFpEF -Euvolemic, continue hydrochlorothiazide  -Add losartan  for kidney protection   Anxiety/depression -Stable   Morbid obesity -BMI> 60 -Patient already on Ozempic     Procedures: Family communication :dter Consults :none CODE STATUS: full code DVT Prophylaxis : Level of care: Med-Surg Status is: Inpatient Remains inpatient appropriate because: treating for resistant E coli UTI with IV rocephin  in house    TOTAL TIME TAKING CARE OF THIS PATIENT: 35 minutes.  >50% time spent on counselling and coordination of care  Note: This dictation was prepared with Dragon dictation along with smaller phrase technology. Any transcriptional errors that result from this process are unintentional.  Leita Blanch M.D    [facility]   CC: Primary care physician; Bair, Kalpana, MD

## 2024-10-28 NOTE — Plan of Care (Signed)

## 2024-10-28 NOTE — Progress Notes (Signed)
 Mobility Specialist - Progress Note   10/28/24 1016  Mobility  Activity Ambulated with assistance  Level of Assistance Standby assist, set-up cues, supervision of patient - no hands on  Assistive Device Cane  Distance Ambulated (ft) 80 ft  Activity Response Tolerated well  Mobility visit 1 Mobility  Mobility Specialist Start Time (ACUTE ONLY) [id number]  Mobility Specialist Stop Time (ACUTE ONLY) 0950  Mobility Specialist Time Calculation (min) (ACUTE ONLY) 8 min   Pt amb 80 ft in the hallway w/ sup, tolerated well. Pt denied SOB and fatigue throughout amb, expressed feeling normal this date. Pt returned to the room, left seated in the recliner with needs within reach and family at bedside.  America Silvan Mobility Specialist 10/28/24 10:31 AM

## 2024-10-29 DIAGNOSIS — E785 Hyperlipidemia, unspecified: Secondary | ICD-10-CM | POA: Diagnosis not present

## 2024-10-29 DIAGNOSIS — E118 Type 2 diabetes mellitus with unspecified complications: Secondary | ICD-10-CM | POA: Diagnosis not present

## 2024-10-29 DIAGNOSIS — E1169 Type 2 diabetes mellitus with other specified complication: Secondary | ICD-10-CM | POA: Diagnosis not present

## 2024-10-29 DIAGNOSIS — N12 Tubulo-interstitial nephritis, not specified as acute or chronic: Secondary | ICD-10-CM | POA: Diagnosis not present

## 2024-10-29 DIAGNOSIS — N3000 Acute cystitis without hematuria: Secondary | ICD-10-CM | POA: Diagnosis not present

## 2024-10-29 LAB — BASIC METABOLIC PANEL WITH GFR
Anion gap: 10 (ref 5–15)
BUN: 11 mg/dL (ref 6–20)
CO2: 29 mmol/L (ref 22–32)
Calcium: 9.4 mg/dL (ref 8.9–10.3)
Chloride: 97 mmol/L — ABNORMAL LOW (ref 98–111)
Creatinine, Ser: 0.7 mg/dL (ref 0.44–1.00)
GFR, Estimated: >60 mL/min
Glucose, Bld: 162 mg/dL — ABNORMAL HIGH (ref 70–99)
Potassium: 4 mmol/L (ref 3.5–5.1)
Sodium: 137 mmol/L (ref 135–145)

## 2024-10-29 LAB — GLUCOSE, CAPILLARY
Glucose-Capillary: 147 mg/dL — ABNORMAL HIGH (ref 70–99)
Glucose-Capillary: 193 mg/dL — ABNORMAL HIGH (ref 70–99)
Glucose-Capillary: 219 mg/dL — ABNORMAL HIGH (ref 70–99)
Glucose-Capillary: 120 mg/dL — ABNORMAL HIGH (ref 70–99)

## 2024-10-29 LAB — CULTURE, BLOOD (ROUTINE X 2)

## 2024-10-29 NOTE — Progress Notes (Signed)
 [facility]  - [place] at [facility]   PATIENT NAME: Jasmine Larsen    MR#:  [id number]  DATE OF BIRTH:  06-Apr-1966  SUBJECTIVE:  dter at bedside. Patient overall feeling a lot better. No fever. Pt informed about completing IV abxs given the Ecoli c/s after d/w with RPH Had BM yday   VITALS:  Blood pressure 129/74, pulse 77, temperature 97.9 F (36.6 C), resp. rate 16, height 5' 2 (1.575 m), weight (!) 156 kg, SpO2 95%.  PHYSICAL EXAMINATION:   GENERAL:  59 y.o.-year-old patient with no acute distress. Morbid obesity LUNGS: Normal breath sounds bilaterally, no wheezing CARDIOVASCULAR: S1, S2 normal. No murmur   ABDOMEN: Soft, nontender, nondistended. Bowel sounds present.  EXTREMITIES: No  edema b/l.    NEUROLOGIC: nonfocal  patient is alert and awake  LABORATORY PANEL:  CBC Recent Labs  Lab 10/26/24 0418  WBC 11.5*  HGB 12.8  HCT 39.5  PLT 209    Chemistries  Recent Labs  Lab 10/29/24 0505  NA 137  K 4.0  CL 97*  CO2 29  GLUCOSE 162*  BUN 11  CREATININE 0.70  CALCIUM  9.4    Assessment and Plan From H and P  59 y.o. female with medical history significant of IIDM, morbid obesity, HTN, chronic HFpEF, asthma/COPD, recently diagnosed pyelonephritis, presented with recurrent urinary symptoms including dysuria, urinary frequency and bilateral back pain.   Symptoms started 2 to 3 days ago when patient started to have urinary frequency associate with burning sensation urinary.  Yesterday she started with bilateral back pain, associated with episode chills but no fever.  The symptoms were very similar to what she had during another process of pyelonephritis/UTI last month.  She is still taking Jardiance .     Acute recurrent bilateral pyelonephritis - Likely related to Jardiance , discontinued Jardiance  again--pt made aware - Review of most recent urine culture back in October showed pansensitive E. coli.   - Continue ceftriaxone  for now--and  completed 7 days IV ginve not much choice on c/s for oral in d/w ID RPH--last dose 1/19 - follow up blood culture 1/2 Staph species--appears contamination --urine culture E coli   DM-2 - Hold metformin , as she received IV contrast this morning - Discontinue Jardiance  due to frequent UTIs - SSI for now - Outpatient follow-up with endocrinology --1/16--resume po metformin    HTN Chronic HFpEF -Euvolemic, continue hydrochlorothiazide  -Add losartan  for kidney protection   Anxiety/depression -Stable   Morbid obesity -BMI> 60 -Patient already on Ozempic     Procedures: Family communication :dter Consults :none CODE STATUS: full code DVT Prophylaxis : Level of care: Med-Surg Status is: Inpatient Remains inpatient appropriate because: treating for resistant E coli UTI with IV rocephin  in house    TOTAL TIME TAKING CARE OF THIS PATIENT: 35 minutes.  >50% time spent on counselling and coordination of care  Note: This dictation was prepared with Dragon dictation along with smaller phrase technology. Any transcriptional errors that result from this process are unintentional.  Leita Blanch M.D    [facility]   CC: Primary care physician; Bair, Kalpana, MD

## 2024-10-29 NOTE — Progress Notes (Signed)
 Mobility Specialist Progress Note:    10/29/24 1452  Mobility  Activity Ambulated with assistance  Level of Assistance Standby assist, set-up cues, supervision of patient - no hands on  Assistive Device Cane  Distance Ambulated (ft) 160 ft  Range of Motion/Exercises Active;All extremities  Activity Response Tolerated well  Mobility visit 1 Mobility  Mobility Specialist Start Time (ACUTE ONLY) 1446  Mobility Specialist Stop Time (ACUTE ONLY) 1453  Mobility Specialist Time Calculation (min) (ACUTE ONLY) 7 min   Pt required supervision to stand and ambulate with cane. Tolerated well, audible SOB during standing rest break. SpO2 98% on RA and HR 87 bpm. Returned to room, all needs met.  Sherrilee Ditty Mobility Specialist Please contact via Special Educational Needs Teacher or  Rehab office at [phone]

## 2024-10-29 NOTE — Plan of Care (Signed)
  Problem: Pain Managment: Goal: General experience of comfort will improve and/or be controlled Outcome: Progressing   Problem: Safety: Goal: Ability to remain free from injury will improve Outcome: Progressing

## 2024-10-30 DIAGNOSIS — N10 Acute tubulo-interstitial nephritis: Principal | ICD-10-CM

## 2024-10-30 LAB — CBC
HCT: 39.2 % (ref 36.0–46.0)
Hemoglobin: 12.5 g/dL (ref 12.0–15.0)
MCH: 27.2 pg (ref 26.0–34.0)
MCHC: 31.9 g/dL (ref 30.0–36.0)
MCV: 85.4 fL (ref 80.0–100.0)
Platelets: 236 K/uL (ref 150–400)
RBC: 4.59 MIL/uL (ref 3.87–5.11)
RDW: 14.4 % (ref 11.5–15.5)
WBC: 6.3 K/uL (ref 4.0–10.5)
nRBC: 0 % (ref 0.0–0.2)

## 2024-10-30 LAB — GLUCOSE, CAPILLARY
Glucose-Capillary: 141 mg/dL — ABNORMAL HIGH (ref 70–99)
Glucose-Capillary: 145 mg/dL — ABNORMAL HIGH (ref 70–99)
Glucose-Capillary: 135 mg/dL — ABNORMAL HIGH (ref 70–99)
Glucose-Capillary: 141 mg/dL — ABNORMAL HIGH (ref 70–99)

## 2024-10-30 LAB — CULTURE, BLOOD (ROUTINE X 2): Culture: NO GROWTH

## 2024-10-30 NOTE — Plan of Care (Signed)

## 2024-10-30 NOTE — Progress Notes (Signed)
 " Progress Note   Patient: Jasmine Larsen FMW:[id number] DOB: [birth year] DOA: 10/25/2024     4 DOS: the patient was seen and examined on 10/30/2024   Brief hospital course:  59 y.o. female with medical history significant of IIDM, morbid obesity, HTN, chronic HFpEF, asthma/COPD, recently diagnosed pyelonephritis, presented with recurrent urinary symptoms including dysuria, urinary frequency and bilateral back pain.   Symptoms started 2 to 3 days ago when patient started to have urinary frequency associate with burning sensation urinary.  Yesterday she started with bilateral back pain, associated with episode chills but no fever.  The symptoms were very similar to what she had during another process of pyelonephritis/UTI last month.  She is still taking Jardiance .    Assessment and Plan:  Acute recurrent bilateral pyelonephritis - Likely related to Jardiance , discontinued Jardiance  again--pt made aware - Review of most recent urine culture back in October showed pansensitive E. coli.   Continue current antibiotics: Ceftriaxone  for now--and completed 7 days IV ginve not much choice on c/s for oral in d/w ID [facility]PH--last dose 1/19 - follow up blood culture 1/2 Staph species--appears contamination --urine culture E coli   DM-2 - Hold metformin , as she received IV contrast this morning - Discontinue Jardiance  due to frequent UTIs - SSI for now - Outpatient follow-up with endocrinology --1/16--resume po metformin    HTN Chronic HFpEF -Euvolemic, continue hydrochlorothiazide  -Add losartan  for kidney protection   Anxiety/depression -Stable   Morbid obesity -BMI> 60 -Patient already on Ozempic      Procedures: Family communication : Daughter Consults :none CODE STATUS: full code DVT Prophylaxis : Level of care: Med-Surg Status is: Inpatient Remains inpatient appropriate because: treating for resistant E coli UTI with IV rocephin  in house      Subjective:  Patient seen and  examined at bedside this morning Denies nausea vomiting abdominal pain chest pain cough Will complete final dose of IV antibiotics by tomorrow  Physical Exam: GENERAL:  59 y.o.-year-old patient with no acute distress. Morbid obesity LUNGS: Normal breath sounds bilaterally, no wheezing CARDIOVASCULAR: S1, S2 normal. No murmur   ABDOMEN: Soft, nontender, nondistended. Bowel sounds present.  EXTREMITIES: No  edema b/l.    NEUROLOGIC: nonfocal  patient is alert and awake  Data Reviewed:    Latest Ref Rng & Units 10/30/2024    3:55 AM 10/26/2024    4:18 AM 10/25/2024   11:21 AM  CBC  WBC 4.0 - 10.5 K/uL 6.3  11.5  14.1   Hemoglobin 12.0 - 15.0 g/dL 87.4  87.1  85.3   Hematocrit 36.0 - 46.0 % 39.2  39.5  44.5   Platelets 150 - 400 K/uL 236  209  257        Latest Ref Rng & Units 10/29/2024    5:05 AM 10/28/2024    5:29 AM 10/27/2024    5:46 AM  BMP  Glucose 70 - 99 mg/dL 837  838  829   BUN 6 - 20 mg/dL 11  11  9    Creatinine 0.44 - 1.00 mg/dL 9.29  9.23  9.16   Sodium 135 - 145 mmol/L 137  136  133   Potassium 3.5 - 5.1 mmol/L 4.0  3.6  3.2   Chloride 98 - 111 mmol/L 97  95  95   CO2 22 - 32 mmol/L 29  29  26    Calcium  8.9 - 10.3 mg/dL 9.4  8.7  8.7      Vitals:   10/29/24 0734 10/29/24 2130 10/30/24  9261 10/30/24 1449  BP: 129/74 126/68 (!) 147/75 132/88  Pulse: 77 78 71 76  Resp: 16 18 16 18   Temp: 97.9 F (36.6 C) 97.8 F (36.6 C) 98.1 F (36.7 C) (!) 97.5 F (36.4 C)  TempSrc:   Oral Oral  SpO2: 95% 97% 98% 100%  Weight:      Height:         Author: Drue ONEIDA Potter, MD 10/30/2024 3:45 PM  For on call review [email or web address].  "

## 2024-10-31 ENCOUNTER — Other Ambulatory Visit

## 2024-10-31 ENCOUNTER — Other Ambulatory Visit: Payer: Self-pay

## 2024-10-31 DIAGNOSIS — N10 Acute tubulo-interstitial nephritis: Secondary | ICD-10-CM | POA: Diagnosis not present

## 2024-10-31 LAB — CBC WITH DIFFERENTIAL/PLATELET
Abs Immature Granulocytes: 0.02 K/uL (ref 0.00–0.07)
Basophils Absolute: 0.1 K/uL (ref 0.0–0.1)
Basophils Relative: 1 %
Eosinophils Absolute: 0.2 K/uL (ref 0.0–0.5)
Eosinophils Relative: 4 %
HCT: 38.4 % (ref 36.0–46.0)
Hemoglobin: 12.4 g/dL (ref 12.0–15.0)
Immature Granulocytes: 0 %
Lymphocytes Relative: 41 %
Lymphs Abs: 2.2 K/uL (ref 0.7–4.0)
MCH: 27.7 pg (ref 26.0–34.0)
MCHC: 32.3 g/dL (ref 30.0–36.0)
MCV: 85.9 fL (ref 80.0–100.0)
Monocytes Absolute: 0.4 K/uL (ref 0.1–1.0)
Monocytes Relative: 7 %
Neutro Abs: 2.5 K/uL (ref 1.7–7.7)
Neutrophils Relative %: 47 %
Platelets: 252 K/uL (ref 150–400)
RBC: 4.47 MIL/uL (ref 3.87–5.11)
RDW: 14.4 % (ref 11.5–15.5)
Smear Review: NORMAL
WBC: 5.4 K/uL (ref 4.0–10.5)
nRBC: 0 % (ref 0.0–0.2)

## 2024-10-31 LAB — BASIC METABOLIC PANEL WITH GFR
Anion gap: 9 (ref 5–15)
BUN: 13 mg/dL (ref 6–20)
CO2: 28 mmol/L (ref 22–32)
Calcium: 9 mg/dL (ref 8.9–10.3)
Chloride: 102 mmol/L (ref 98–111)
Creatinine, Ser: 0.56 mg/dL (ref 0.44–1.00)
GFR, Estimated: >60 mL/min
Glucose, Bld: 137 mg/dL — ABNORMAL HIGH (ref 70–99)
Potassium: 4 mmol/L (ref 3.5–5.1)
Sodium: 138 mmol/L (ref 135–145)

## 2024-10-31 LAB — GLUCOSE, CAPILLARY: Glucose-Capillary: 157 mg/dL — ABNORMAL HIGH (ref 70–99)

## 2024-10-31 MED ORDER — LOSARTAN POTASSIUM 25 MG PO TABS
25.0000 mg | ORAL_TABLET | Freq: Every day | ORAL | 1 refills | Status: DC
  Filled 2024-10-31: qty 30, 30d supply, fill #0

## 2024-10-31 MED ORDER — SPIRONOLACTONE 25 MG PO TABS
12.5000 mg | ORAL_TABLET | Freq: Every day | ORAL | 1 refills | Status: DC
  Filled 2024-10-31: qty 30, 60d supply, fill #0

## 2024-10-31 MED ORDER — POLYETHYLENE GLYCOL 3350 17 GM/SCOOP PO POWD
17.0000 g | Freq: Every day | ORAL | 0 refills | Status: AC
  Filled 2024-10-31: qty 238, 14d supply, fill #0

## 2024-10-31 MED ORDER — BISACODYL 10 MG RE SUPP
10.0000 mg | Freq: Every day | RECTAL | 0 refills | Status: AC | PRN
  Filled 2024-10-31: qty 12, 12d supply, fill #0

## 2024-10-31 NOTE — Plan of Care (Signed)

## 2024-10-31 NOTE — Discharge Summary (Signed)
 " Physician Discharge Summary   Patient: Jasmine Larsen MRN: [id number] DOB: 1965/11/04  Admit date:     10/25/2024  Discharge date: 10/31/24  Discharge Physician: Drue ONEIDA Potter   PCP: Bair, Kalpana, MD   Recommendations at discharge:  Follow-up on PCP  Discharge Diagnoses: Active Problems:   Pyelonephritis   UTI (urinary tract infection) Acute recurrent bilateral pyelonephritis Resolved Problems:   * No resolved hospital problems. *  Hospital Course: HPI 59 y.o. female with medical history significant of IIDM, morbid obesity, HTN, chronic HFpEF, asthma/COPD, recently diagnosed pyelonephritis, presented with recurrent urinary symptoms including dysuria, urinary frequency and bilateral back pain.   Symptoms started 2 to 3 days ago when patient started to have urinary frequency associate with burning sensation urinary.  Yesterday she started with bilateral back pain, associated with episode chills but no fever.  The symptoms were very similar to what she had during another process of pyelonephritis/UTI last month.  She is still taking Jardiance .       Assessment and Plan:  Acute recurrent bilateral pyelonephritis - Likely related to Jardiance , discontinued Jardiance  again--pt made aware - Review of most recent urine culture back in October showed pansensitive E. coli.   Has completed antibiotics  DM-2 - Hold metformin , as she received IV contrast this morning - Discontinued Jardiance  due to frequent UTIs - SSI for now - Outpatient follow-up with endocrinology --1/16--resume po metformin    HTN Chronic HFpEF -Euvolemic, continue hydrochlorothiazide  -Add losartan  for kidney protection   Anxiety/depression -Stable   Morbid obesity -BMI> 60 -Patient already on Ozempic     Consultants: None Procedures performed: None Disposition: Home Diet recommendation:  Cardiac and Carb modified diet DISCHARGE MEDICATION: Allergies as of 10/31/2024       Reactions   Cymbalta   [duloxetine  Hcl]    SI thoughts        Medication List     STOP taking these medications    empagliflozin  10 MG Tabs tablet Commonly known as: Jardiance    hydrochlorothiazide  25 MG tablet Commonly known as: HYDRODIURIL        TAKE these medications    rOPINIRole  1 MG tablet Commonly known as: REQUIP  Take 1 tablet (1 mg total) by mouth at bedtime. The timing of this medication is very important.   Accu-Chek Guide Test test strip Generic drug: glucose blood Use as instructed   Accu-Chek Softclix Lancets lancets 1 each by Other route daily.   acetaminophen  325 MG tablet Commonly known as: TYLENOL  Take 2 tablets (650 mg total) by mouth every 6 (six) hours as needed for mild pain (pain score 1-3) or fever (or Fever >/= 101).   albuterol  108 (90 Base) MCG/ACT inhaler Commonly known as: VENTOLIN  HFA Inhale 1-2 puffs into the lungs every 6 (six) hours as needed for wheezing or shortness of breath. What changed: Another medication with the same name was removed. Continue taking this medication, and follow the directions you see here.   benzonatate  200 MG capsule Commonly known as: TESSALON  Take 1 capsule (200 mg total) by mouth 2 (two) times daily as needed for cough.   bisacodyl  10 MG suppository Commonly known as: DULCOLAX Place 1 suppository (10 mg total) rectally daily as needed for moderate constipation.   blood glucose meter kit and supplies Kit Use up to 4 times daily.   Blood Glucose Monitoring Suppl Devi 1 each by Does not apply route as directed. May substitute to any manufacturer covered by patient's insurance.   dicyclomine  20 MG tablet Commonly known  as: BENTYL  Take 1 tablet (20 mg total) by mouth 3 (three) times daily as needed for spasms.   FeroSul 325 (65 Fe) MG tablet Generic drug: ferrous sulfate  Take 1 tablet (325 mg total) by mouth daily with breakfast.   ipratropium-albuterol  0.5-2.5 (3) MG/3ML Soln Commonly known as: DUONEB Take 3 mLs by  nebulization every 6 (six) hours as needed.   losartan  25 MG tablet Commonly known as: COZAAR  Take 1 tablet (25 mg total) by mouth daily. Start taking on: November 01, 2024   metFORMIN  500 MG tablet Commonly known as: GLUCOPHAGE  Take 1 tablet (500 mg total) by mouth 2 (two) times daily with a meal.   ondansetron  4 MG disintegrating tablet Commonly known as: ZOFRAN -ODT Take 1 tablet (4 mg total) by mouth daily as needed for nausea or vomiting.   Ozempic  (2 MG/DOSE) 8 MG/3ML Sopn Generic drug: Semaglutide  (2 MG/DOSE) Inject 2 mg into the skin once a week.   polyethylene glycol powder 17 GM/SCOOP powder Commonly known as: GLYCOLAX /MIRALAX  Take 17 g by mouth daily. Dissolve 1 capful (17g) in 4-8 ounces of liquid and take by mouth daily. Start taking on: November 01, 2024   rosuvastatin  20 MG tablet Commonly known as: Crestor  Take 1 tablet (20 mg total) by mouth at bedtime.   spironolactone  25 MG tablet Commonly known as: ALDACTONE  Take 0.5 tablets (12.5 mg total) by mouth daily. Start taking on: November 01, 2024   tiZANidine  4 MG tablet Commonly known as: ZANAFLEX  Take 1 tablet (4 mg total) by mouth 2 (two) times daily as needed for muscle spasms.   Trelegy Ellipta  100-62.5-25 MCG/ACT Aepb Generic drug: Fluticasone -Umeclidin-Vilant Inhale 1 puff into the lungs daily.   Vitamin D  (Ergocalciferol ) 1.25 MG (50000 UNIT) Caps capsule Commonly known as: DRISDOL  Take 1 capsule (50,000 Units total) by mouth every 7 (seven) days for 8 doses.        Discharge Exam: Filed Weights   10/25/24 1245 10/25/24 2100  Weight: (!) 161.8 kg (!) 156 kg   GENERAL:  59 y.o.-year-old patient with no acute distress. Morbid obesity LUNGS: Normal breath sounds bilaterally, no wheezing CARDIOVASCULAR: S1, S2 normal. No murmur   ABDOMEN: Soft, nontender, nondistended. Bowel sounds present.  EXTREMITIES: No  edema b/l.    NEUROLOGIC: nonfocal  patient is alert and awake  Condition at  discharge: good  The results of significant diagnostics from this hospitalization (including imaging, microbiology, ancillary and laboratory) are listed below for reference.   Imaging Studies: CT ABDOMEN PELVIS W CONTRAST Result Date: 10/25/2024 CLINICAL DATA:  Recurrent UTI. EXAM: CT ABDOMEN AND PELVIS WITH CONTRAST TECHNIQUE: Multidetector CT imaging of the abdomen and pelvis was performed using the standard protocol following bolus administration of intravenous contrast. RADIATION DOSE REDUCTION: This exam was performed according to the departmental dose-optimization program which includes automated exposure control, adjustment of the mA and/or kV according to patient size and/or use of iterative reconstruction technique. CONTRAST:  OMNIPAQUE  IOHEXOL  300 MG/ML  SOLN COMPARISON:  CT abdomen pelvis dated 09/25/2024. FINDINGS: Lower chest: The visualized lung bases are clear. No intra-abdominal free air or free fluid. Hepatobiliary: Fatty liver. No biliary duct dilatation. The gallbladder is unremarkable. Pancreas: Unremarkable. No pancreatic ductal dilatation or surrounding inflammatory changes. Spleen: Normal in size without focal abnormality. Adrenals/Urinary Tract: The adrenal glands are unremarkable. There is no hydronephrosis on either side. There is heterogeneous hypoenhancement of the superior poles of the kidneys, left greater than right, most consistent with pyelonephritis. No abscess. The visualized ureters appear  unremarkable. Mild diffuse thickened appearance of the bladder wall with urothelial enhancement consistent with UTI. Stomach/Bowel: There is sigmoid diverticulosis and scattered colonic diverticula. There is no bowel obstruction or active inflammation. The appendix is normal. Vascular/Lymphatic: Mild atherosclerotic calcification of the abdominal aorta. The IVC is unremarkable. No portal venous gas. There is no adenopathy. Reproductive: The uterus is anteverted. Thickened appearance  of the endometrium measuring 15 mm in thickness. Further evaluation with pelvic ultrasound on a nonemergent/outpatient basis recommended. No suspicious adnexal masses. Other: Fat containing umbilical hernia. Musculoskeletal: Bilateral sacroiliitis. No acute osseous pathology. IMPRESSION: 1. Cystitis and bilateral pyelonephritis. No abscess. 2. Fatty liver. 3. Colonic diverticulosis. No bowel obstruction. Normal appendix. 4. Thickened appearance of the endometrium. Further evaluation with pelvic ultrasound on a nonemergent/outpatient basis recommended. 5.  Aortic Atherosclerosis (ICD10-I70.0). Electronically Signed   By: Vanetta Chou M.D.   On: 10/25/2024 13:25    Microbiology: Results for orders placed or performed during the hospital encounter of 10/25/24  Urine Culture     Status: Abnormal   Collection Time: 10/25/24 11:21 AM   Specimen: Urine, Clean Catch  Result Value Ref Range Status   Specimen Description   Final    URINE, CLEAN CATCH Performed at [facility], [address], [place], [place] [address]    Special Requests   Final    NONE Performed at [facility], [address] [place], [place] [address]    Culture >=100,000 COLONIES/mL ESCHERICHIA COLI (A)  Final   Report Status 10/27/2024 FINAL  Final   Organism ID, Bacteria ESCHERICHIA COLI (A)  Final      Susceptibility   Escherichia coli - MIC*    AMPICILLIN >=32 RESISTANT Resistant     CEFAZOLIN  (URINE) Value in next row Resistant      >=32 RESISTANTThis is a modified FDA-approved test that has been validated and its performance characteristics determined by the reporting laboratory.  This laboratory is certified under the Clinical Laboratory Improvement Amendments CLIA as qualified to perform high complexity clinical laboratory testing.    CEFEPIME Value in next row Sensitive      >=32 RESISTANTThis is a modified FDA-approved test that has been validated and its performance characteristics determined  by the reporting laboratory.  This laboratory is certified under the Clinical Laboratory Improvement Amendments CLIA as qualified to perform high complexity clinical laboratory testing.    ERTAPENEM Value in next row Sensitive      >=32 RESISTANTThis is a modified FDA-approved test that has been validated and its performance characteristics determined by the reporting laboratory.  This laboratory is certified under the Clinical Laboratory Improvement Amendments CLIA as qualified to perform high complexity clinical laboratory testing.    CEFTRIAXONE  Value in next row Sensitive      >=32 RESISTANTThis is a modified FDA-approved test that has been validated and its performance characteristics determined by the reporting laboratory.  This laboratory is certified under the Clinical Laboratory Improvement Amendments CLIA as qualified to perform high complexity clinical laboratory testing.    CIPROFLOXACIN  Value in next row Intermediate      >=32 RESISTANTThis is a modified FDA-approved test that has been validated and its performance characteristics determined by the reporting laboratory.  This laboratory is certified under the Clinical Laboratory Improvement Amendments CLIA as qualified to perform high complexity clinical laboratory testing.    GENTAMICIN Value in next row Sensitive      >=32 RESISTANTThis is a modified FDA-approved test that has been validated and its performance characteristics  determined by the reporting laboratory.  This laboratory is certified under the Clinical Laboratory Improvement Amendments CLIA as qualified to perform high complexity clinical laboratory testing.    NITROFURANTOIN Value in next row Sensitive      >=32 RESISTANTThis is a modified FDA-approved test that has been validated and its performance characteristics determined by the reporting laboratory.  This laboratory is certified under the Clinical Laboratory Improvement Amendments CLIA as qualified to perform high  complexity clinical laboratory testing.    TRIMETH /SULFA  Value in next row Resistant      >=32 RESISTANTThis is a modified FDA-approved test that has been validated and its performance characteristics determined by the reporting laboratory.  This laboratory is certified under the Clinical Laboratory Improvement Amendments CLIA as qualified to perform high complexity clinical laboratory testing.    AMPICILLIN/SULBACTAM Value in next row Resistant      >=32 RESISTANTThis is a modified FDA-approved test that has been validated and its performance characteristics determined by the reporting laboratory.  This laboratory is certified under the Clinical Laboratory Improvement Amendments CLIA as qualified to perform high complexity clinical laboratory testing.    PIP/TAZO Value in next row Intermediate      64 INTERMEDIATEThis is a modified FDA-approved test that has been validated and its performance characteristics determined by the reporting laboratory.  This laboratory is certified under the Clinical Laboratory Improvement Amendments CLIA as qualified to perform high complexity clinical laboratory testing.    MEROPENEM Value in next row Sensitive      64 INTERMEDIATEThis is a modified FDA-approved test that has been validated and its performance characteristics determined by the reporting laboratory.  This laboratory is certified under the Clinical Laboratory Improvement Amendments CLIA as qualified to perform high complexity clinical laboratory testing.    * >=100,000 COLONIES/mL ESCHERICHIA COLI  Culture, blood (routine x 2)     Status: None   Collection Time: 10/25/24  3:00 PM   Specimen: BLOOD  Result Value Ref Range Status   Specimen Description BLOOD LEFT ANTECUBITAL  Final   Special Requests   Final    BOTTLES DRAWN AEROBIC AND ANAEROBIC Blood Culture results may not be optimal due to an inadequate volume of blood received in culture bottles   Culture   Final    NO GROWTH 5 DAYS Performed at  [facility], [address] [place], [place] [address]    Report Status 10/30/2024 FINAL  Final  Culture, blood (routine x 2)     Status: Abnormal   Collection Time: 10/25/24  3:05 PM   Specimen: BLOOD  Result Value Ref Range Status   Specimen Description   Final    BLOOD RIGHT ANTECUBITAL Performed at [facility], [address], [place], [place] [address]    Special Requests   Final    BOTTLES DRAWN AEROBIC AND ANAEROBIC Blood Culture results may not be optimal due to an inadequate volume of blood received in culture bottles Performed at [facility], [address] [place], [place] [address]    Culture  Setup Time   Final    GRAM POSITIVE COCCI IN BOTH AEROBIC AND ANAEROBIC BOTTLES CRITICAL RESULT CALLED TO, READ BACK BY AND VERIFIED WITH: AMANDA CHOI PHARMD 1401 10/26/24 HNM    Culture (A)  Final    STAPHYLOCOCCUS EPIDERMIDIS THE SIGNIFICANCE OF ISOLATING THIS ORGANISM FROM A SINGLE SET OF BLOOD CULTURES WHEN MULTIPLE SETS ARE DRAWN IS UNCERTAIN. PLEASE NOTIFY THE MICROBIOLOGY DEPARTMENT WITHIN ONE WEEK IF SPECIATION AND SENSITIVITIES  ARE REQUIRED. Performed at [facility], [address] [place], [place] [address]    Report Status 10/29/2024 FINAL  Final  Blood Culture ID Panel (Reflexed)     Status: Abnormal   Collection Time: 10/25/24  3:05 PM  Result Value Ref Range Status   Enterococcus faecalis NOT DETECTED NOT DETECTED Final   Enterococcus Faecium NOT DETECTED NOT DETECTED Final   Listeria monocytogenes NOT DETECTED NOT DETECTED Final   Staphylococcus species DETECTED (A) NOT DETECTED Final    Comment: CRITICAL RESULT CALLED TO, READ BACK BY AND VERIFIED WITH: AMANDA CHOI PHARMD 1401 10/26/24 HNM    Staphylococcus aureus (BCID) NOT DETECTED NOT DETECTED Final   Staphylococcus epidermidis DETECTED (A) NOT DETECTED Final    Comment: CRITICAL RESULT CALLED TO, READ BACK BY AND VERIFIED WITH: AMANDA CHOI PHARMD 1401 10/26/24 HNM     Staphylococcus lugdunensis NOT DETECTED NOT DETECTED Final   Streptococcus species NOT DETECTED NOT DETECTED Final   Streptococcus agalactiae NOT DETECTED NOT DETECTED Final   Streptococcus pneumoniae NOT DETECTED NOT DETECTED Final   Streptococcus pyogenes NOT DETECTED NOT DETECTED Final   A.calcoaceticus-baumannii NOT DETECTED NOT DETECTED Final   Bacteroides fragilis NOT DETECTED NOT DETECTED Final   Enterobacterales NOT DETECTED NOT DETECTED Final   Enterobacter cloacae complex NOT DETECTED NOT DETECTED Final   Escherichia coli NOT DETECTED NOT DETECTED Final   Klebsiella aerogenes NOT DETECTED NOT DETECTED Final   Klebsiella oxytoca NOT DETECTED NOT DETECTED Final   Klebsiella pneumoniae NOT DETECTED NOT DETECTED Final   Proteus species NOT DETECTED NOT DETECTED Final   Salmonella species NOT DETECTED NOT DETECTED Final   Serratia marcescens NOT DETECTED NOT DETECTED Final   Haemophilus influenzae NOT DETECTED NOT DETECTED Final   Neisseria meningitidis NOT DETECTED NOT DETECTED Final   Pseudomonas aeruginosa NOT DETECTED NOT DETECTED Final   Stenotrophomonas maltophilia NOT DETECTED NOT DETECTED Final   Candida albicans NOT DETECTED NOT DETECTED Final   Candida auris NOT DETECTED NOT DETECTED Final   Candida glabrata NOT DETECTED NOT DETECTED Final   Candida krusei NOT DETECTED NOT DETECTED Final   Candida parapsilosis NOT DETECTED NOT DETECTED Final   Candida tropicalis NOT DETECTED NOT DETECTED Final   Cryptococcus neoformans/gattii NOT DETECTED NOT DETECTED Final   Methicillin resistance mecA/C NOT DETECTED NOT DETECTED Final    Comment: Performed at [facility], [address] [place], [place] [address]  Culture, blood (Routine X 2) w Reflex to ID Panel     Status: None (Preliminary result)   Collection Time: 10/30/24  9:37 AM   Specimen: BLOOD  Result Value Ref Range Status   Specimen Description BLOOD BLOOD RIGHT ARM  Final   Special Requests   Final     BOTTLES DRAWN AEROBIC AND ANAEROBIC Blood Culture adequate volume   Culture   Final    NO GROWTH < 24 HOURS Performed at [facility], [address] [place], [place] [address]    Report Status PENDING  Incomplete  Culture, blood (Routine X 2) w Reflex to ID Panel     Status: None (Preliminary result)   Collection Time: 10/30/24  9:45 AM   Specimen: BLOOD  Result Value Ref Range Status   Specimen Description BLOOD BLOOD LEFT ARM  Final   Special Requests   Final    BOTTLES DRAWN AEROBIC AND ANAEROBIC Blood Culture adequate volume   Culture   Final    NO GROWTH < 24 HOURS Performed at [facility]  [facility], [address] [place], [place] [address]    Report Status PENDING  Incomplete    Labs: CBC: Recent Labs  Lab 10/25/24 1121 10/26/24 0418 10/30/24 0355 10/31/24 0540  WBC 14.1* 11.5* 6.3 5.4  NEUTROABS  --   --   --  2.5  HGB 14.6 12.8 12.5 12.4  HCT 44.5 39.5 39.2 38.4  MCV 84.8 85.5 85.4 85.9  PLT 257 209 236 252   Basic Metabolic Panel: Recent Labs  Lab 10/26/24 0418 10/27/24 0546 10/28/24 0529 10/29/24 0505 10/31/24 0540  NA 133* 133* 136 137 138  K 4.0 3.2* 3.6 4.0 4.0  CL 97* 95* 95* 97* 102  CO2 26 26 29 29 28   GLUCOSE 209* 170* 161* 162* 137*  BUN 8 9 11 11 13   CREATININE 0.94 0.83 0.76 0.70 0.56  CALCIUM  8.5* 8.7* 8.7* 9.4 9.0   Liver Function Tests: No results for input(s): AST, ALT, ALKPHOS, BILITOT, PROT, ALBUMIN in the last 168 hours. CBG: Recent Labs  Lab 10/30/24 0740 10/30/24 1121 10/30/24 1719 10/30/24 2007 10/31/24 0804  GLUCAP 141* 145* 135* 141* 157*    Discharge time spent:  37 minutes.  Signed: Drue ONEIDA Potter, MD [facility] 10/31/2024 "

## 2024-11-01 ENCOUNTER — Telehealth: Payer: Self-pay

## 2024-11-01 NOTE — Transitions of Care (Post Inpatient/ED Visit) (Signed)
" ° °  11/01/2024  Name: Jasmine Larsen MRN: [id number] DOB: December 07, 1965  Today's TOC FU Call Status: Today's TOC FU Call Status:: Successful TOC FU Call Completed TOC FU Call Complete Date: 11/01/24 [facility] briefly with patient. Provided information on the [facility] program and patient states, Im going to see my doctor tomorrow and Im good honey - Patient declined TOC call program)  Patient's Name and Date of Birth confirmed. DOB, Name  Transition Care Management Follow-up Telephone Call Date of Discharge: 10/31/24 Discharge Facility: [facility]) Type of Discharge: Inpatient Admission Primary Inpatient Discharge Diagnosis:: Pyelonephritis, UTI   Shona Prow RN, CCM [place]  VBCI-Population Health RN Care Manager [phone]  "

## 2024-11-02 ENCOUNTER — Ambulatory Visit: Payer: Self-pay

## 2024-11-02 ENCOUNTER — Ambulatory Visit

## 2024-11-02 VITALS — BP 130/80 | HR 101 | Temp 98.2°F | Wt 350.2 lb

## 2024-11-02 DIAGNOSIS — Z79899 Other long term (current) drug therapy: Secondary | ICD-10-CM

## 2024-11-02 DIAGNOSIS — K5790 Diverticulosis of intestine, part unspecified, without perforation or abscess without bleeding: Secondary | ICD-10-CM | POA: Insufficient documentation

## 2024-11-02 DIAGNOSIS — F5101 Primary insomnia: Secondary | ICD-10-CM | POA: Insufficient documentation

## 2024-11-02 DIAGNOSIS — N859 Noninflammatory disorder of uterus, unspecified: Secondary | ICD-10-CM | POA: Insufficient documentation

## 2024-11-02 DIAGNOSIS — I152 Hypertension secondary to endocrine disorders: Secondary | ICD-10-CM

## 2024-11-02 DIAGNOSIS — R600 Localized edema: Secondary | ICD-10-CM

## 2024-11-02 DIAGNOSIS — E118 Type 2 diabetes mellitus with unspecified complications: Secondary | ICD-10-CM

## 2024-11-02 DIAGNOSIS — K760 Fatty (change of) liver, not elsewhere classified: Secondary | ICD-10-CM | POA: Insufficient documentation

## 2024-11-02 DIAGNOSIS — N12 Tubulo-interstitial nephritis, not specified as acute or chronic: Secondary | ICD-10-CM

## 2024-11-02 DIAGNOSIS — N390 Urinary tract infection, site not specified: Secondary | ICD-10-CM

## 2024-11-02 MED ORDER — TRAZODONE HCL 50 MG PO TABS: 25.0000 mg | ORAL_TABLET | Freq: Every day | ORAL | 3 refills | Status: AC

## 2024-11-02 MED ORDER — LOSARTAN POTASSIUM 25 MG PO TABS: 25.0000 mg | ORAL_TABLET | Freq: Every day | ORAL | 3 refills | Status: AC

## 2024-11-02 MED ORDER — SPIRONOLACTONE 25 MG PO TABS: 12.5000 mg | ORAL_TABLET | Freq: Every day | ORAL | 3 refills | Status: AC

## 2024-11-02 NOTE — Assessment & Plan Note (Signed)
 Complications include hypertension, obesity, hyperlipidemia, neuropathy of lower extremities, microalbuminuria.  Unable to tolerate Jardiance  due to recurrent UTI, pyelonephritis.  Jardiance  added to patient's allergy list to avoid further use.  Continue metformin  500 mg twice a day, Ozempic  2 mg weekly.  Repeat A1c in March.  Consider referral to endocrinologist if A1c above goal during follow-up visit.

## 2024-11-02 NOTE — Assessment & Plan Note (Addendum)
 Recent hospitalization for recurrent UTI with pyelonephritis.  Suspect secondary to Jardiance .  Continue cessation of Jardiance , SGLT2 medication.  Given recurrent UTI, pyelonephritis recommend urology consult.  Referral made today.  Reviewed signs and symptoms for UTI and seek immediate medical care if occurs. Ensure adequate hydration to prevent UTIs. Orders:   Ambulatory referral to Urology

## 2024-11-02 NOTE — Assessment & Plan Note (Deleted)
 [name]

## 2024-11-02 NOTE — Progress Notes (Signed)
 "  Established Patient Office Visit Hospital follow up    Subjective  Patient ID: Jasmine Larsen, female    DOB: [birth year]  Age: 59 y.o. MRN: [id number]  Chief Complaint  Patient presents with   Hospitalization Follow-up   Diabetes    Discussed the use of AI scribe software for clinical note transcription with the patient, who gave verbal consent to proceed.  History of Present Illness Jasmine Larsen is a 59 year old female with recurrent urinary tract infections, pyelonephritis, type 2 diabetes who presents for follow-up after a recent hospitalization.   - Hospitalization for UTI and recurrent pyelonephritis from 10/25/2024-10/31/2024.  Urine culture from 10/25/2024 positive for E. coli  Blood cultures x 2 normal Leukocytosis resolved.  Mild hyponatremia in the hospital was resolved prior to discharge. Mild hyponatremia in hospital resolved at discharge.  Ct abdomen and pelvis from 10/25/2024:  1. Cystitis and bilateral pyelonephritis. No abscess 2. Fatty liver 3. Colonic diverticulosis. No bowel obstruction. Normal appendix. 4. Thickened appearance of the endometrium. Further evaluation with pelvic ultrasound on a nonemergent/outpatient basis recommended. 5.  Aortic Atherosclerosis  - Patient is doing better compared to her hospital stay.  UTI and pyelonephritis was suspected secondary to Jardiance  use.  Patient had just restarted taking Jardiance  when her urinary symptoms began.  She was also started on losartan  25 mg, spironolactone  12.5 mg to help with blood pressure and aortic edema.  She is taking metformin  500 mg twice a day, as well as Ozempic  2 mg weekly for diabetes.  - She was prescribed trazodone  in the hospital which helped with insomnia.  - She reports a history of irregular menstrual cycles, with her last period occurring in 2022. She has a history of difficulty with gynecological exams and is due for a follow up with gynecologist.   - She has a history of  constipation, IBS.  Her baseline bowel movement frequency is every two to three days, with occasional hard stools.    ROS As per HPI    Objective:     BP 130/80 (Cuff Size: Large)   Pulse (!) 101   Temp 98.2 F (36.8 C) (Oral)   Wt (!) 350 lb 3.2 oz (158.8 kg)   SpO2 98%   BMI 64.05 kg/m      10/18/2024   11:28 AM 09/22/2024   11:11 AM 08/17/2024    9:05 AM  Depression screen PHQ 2/9  Decreased Interest 1 0 1  Down, Depressed, Hopeless 0 0 0  PHQ - 2 Score 1 0 1      10/18/2024   11:41 AM 03/11/2024   11:36 AM 08/13/2023    1:38 PM 08/06/2023    1:40 PM  GAD 7 : Generalized Anxiety Score  Nervous, Anxious, on Edge 1  2  1   0   Control/stop worrying 0  1  2  3    Worry too much - different things 1  2  2  3    Trouble relaxing 0  2  2  3    Restless 0  1  1  1    Easily annoyed or irritable 0  2  1  3    Afraid - awful might happen 1  1  1  3    Total GAD 7 Score 3 11 10 16   Anxiety Difficulty  Not difficult at all Not difficult at all Very difficult     Data saved with a previous flowsheet row definition      10/18/2024  11:28 AM 09/22/2024   11:11 AM 08/17/2024    9:05 AM  Depression screen PHQ 2/9  Decreased Interest 1 0 1  Down, Depressed, Hopeless 0 0 0  PHQ - 2 Score 1 0 1      10/18/2024   11:41 AM 03/11/2024   11:36 AM 08/13/2023    1:38 PM 08/06/2023    1:40 PM  GAD 7 : Generalized Anxiety Score  Nervous, Anxious, on Edge 1  2  1   0   Control/stop worrying 0  1  2  3    Worry too much - different things 1  2  2  3    Trouble relaxing 0  2  2  3    Restless 0  1  1  1    Easily annoyed or irritable 0  2  1  3    Afraid - awful might happen 1  1  1  3    Total GAD 7 Score 3 11 10 16   Anxiety Difficulty  Not difficult at all Not difficult at all Very difficult     Data saved with a previous flowsheet row definition   SDOH Screenings   Food Insecurity: No Food Insecurity (10/25/2024)  Housing: Low Risk (10/25/2024)  Transportation Needs: No Transportation  Needs (10/25/2024)  Utilities: Not At Risk (10/25/2024)  Alcohol Screen: Low Risk (09/29/2024)  Depression (PHQ2-9): Low Risk (10/18/2024)  Financial Resource Strain: Patient Declined (09/29/2024)  Physical Activity: Inactive (10/18/2024)  Social Connections: Socially Isolated (09/29/2024)  Stress: Stress Concern Present (09/29/2024)  Tobacco Use: High Risk (11/02/2024)  Health Literacy: Adequate Health Literacy (02/22/2024)    Physical Exam Constitutional:      General: She is not in acute distress.    Appearance: She is obese.  HENT:     Head: Normocephalic and atraumatic.     Mouth/Throat:     Mouth: Mucous membranes are moist.  Neck:     Thyroid : No thyroid  mass or thyroid  tenderness.  Cardiovascular:     Rate and Rhythm: Normal rate and regular rhythm.  Pulmonary:     Effort: Pulmonary effort is normal.     Breath sounds: Normal breath sounds. No wheezing.  Abdominal:     General: Bowel sounds are normal.     Palpations: Abdomen is soft.     Tenderness: There is no abdominal tenderness. There is no guarding.  Musculoskeletal:     Cervical back: Neck supple. No rigidity.     Comments: Nonpitting bilateral lower leg lymphedema  Skin:    General: Skin is warm.  Neurological:     Mental Status: She is alert and oriented to person, place, and time.  Psychiatric:        Mood and Affect: Mood normal.        Behavior: Behavior normal.        No results found for any visits on 11/02/24.  The ASCVD Risk score (Arnett DK, et al., 2019) failed to calculate for the following reasons:   The valid total cholesterol range is 130 to 320 mg/dL     Assessment & Plan:  Patient is a pleasant 59 year old female here with her daughter for hospital follow-up visit.  Recent hospital discharge note, labs and imaging reviewed.  Patient is doing better overall. Assessment & Plan Recurrent UTI Recent hospitalization for recurrent UTI with pyelonephritis.  Suspect secondary to Jardiance .   Continue cessation of Jardiance , SGLT2 medication.  Given recurrent UTI, pyelonephritis recommend urology consult.  Referral made today.  Reviewed signs  and symptoms for UTI and seek immediate medical care if occurs. Ensure adequate hydration to prevent UTIs. Orders:   Ambulatory referral to Urology  Pyelonephritis Plan per recurrent UTI   Endometrial disorder Patient was found to have thickened endometrium on CT abdomen and pelvis during recent hospitalization.  Imaging results during recent thickened appearance of the endometrium. Postmenopausal: No menses since 2022.  Discussed potential role of excess estrogen due to obesity. Patient is already established with gynecologist Dr. Starla.  Recommend patient reach out to her office to schedule an appointment.  If she is not able to see a gynecologist recommend she reaches out to our clinic. Encouraged weight loss to reduce estrogen levels.    Medication management Continue losartan  25 mg and spironolactone  12.5 mg.  Will need BMP for follow-up on electrolytes and renal function. Orders:   Basic Metabolic Panel (BMET); Future  Hypertension associated with diabetes (HCC) Losartan  25 mg and spironolactone  12.5 mg was started in the hospital.  Continue.  Refill sent.  Check BMP in 1 month, future lab ordered.    Lower extremity edema Continue losartan  12.5 mg daily in the morning.  Continue follow-up with vascular department for lymphedema management.  Lower leg edema significantly better today.    Diverticulosis Discussed risk of diverticulitis if stool becomes impacted. Emphasized maintaining soft stools to prevent complications. Provided information on diverticulosis and diverticulitis. Encouraged high fiber diet and adequate hydration. Use Miralax  if no bowel movement for three days.    Fatty liver Noted on recent CT abdomen and pelvis during hospital stay.  Continue to monitor liver function tests periodically. Encouraged weight  loss and reduction of carbohydrate intake.    Primary insomnia Chronic, during hospital visit improved insomnia with trazodone  25 mg nightly.  Continue refill sent.    Type 2 diabetes mellitus with complications (HCC) Complications include hypertension, obesity, hyperlipidemia, neuropathy of lower extremities, microalbuminuria.  Unable to tolerate Jardiance  due to recurrent UTI, pyelonephritis.  Jardiance  added to patient's allergy list to avoid further use.  Continue metformin  500 mg twice a day, Ozempic  2 mg weekly.  Repeat A1c in March.  Consider referral to endocrinologist if A1c above goal during follow-up visit.     Return for non-fasting lab in 1 M, f/u with Dr. Abbey 01/10/25 as scheduled. .   Raissa Dam, MD "

## 2024-11-02 NOTE — Assessment & Plan Note (Signed)
 Chronic, during hospital visit improved insomnia with trazodone  25 mg nightly.  Continue refill sent.

## 2024-11-02 NOTE — Progress Notes (Signed)
 Discussed normal blood culture result during ov on 11/02/24.  Luke Shade, MD

## 2024-11-02 NOTE — Assessment & Plan Note (Signed)
 Discussed risk of diverticulitis if stool becomes impacted. Emphasized maintaining soft stools to prevent complications. Provided information on diverticulosis and diverticulitis. Encouraged high fiber diet and adequate hydration. Use Miralax  if no bowel movement for three days.

## 2024-11-02 NOTE — Assessment & Plan Note (Signed)
 Noted on recent CT abdomen and pelvis during hospital stay.  Continue to monitor liver function tests periodically. Encouraged weight loss and reduction of carbohydrate intake.

## 2024-11-02 NOTE — Patient Instructions (Addendum)
-   Take Trazodone  25 mg at bedtime.  - Take Spironolactone  12.5 mg in the morning.  - Take Losartan  25 mg daily.  - Schedule lab only appointment in 1 month to repeat kidney function.  - I am referring you to urologist, please reach out to us  if you do not hear form them in 2 weeks.  - Please reach out to ob/gyn to follow up on abnormal CT during your hospital stay.   Diverticulosis (this is what you have)  Diverticulosis means small pouches (called diverticula) have formed in the wall of the colon. It is very common, especially with aging. Most people have no symptoms and dont know they have it. It is not an infection and not dangerous by itself. Management focuses on prevention, mainly: High-fiber diet Adequate fluids Regular bowel habits Diverticulitis  Diverticulitis happens when one or more of those pouches becomes inflamed or infected. This is an active illness. Common symptoms include: Left-sided abdominal pain Fever Nausea or vomiting Changes in bowel habits It requires treatment, which may include: Hospitalization  Antibiotics Temporary diet changes   Luke Shade, MD

## 2024-11-02 NOTE — Assessment & Plan Note (Signed)
 Plan per recurrent UTI

## 2024-11-02 NOTE — Assessment & Plan Note (Addendum)
 Continue losartan  25 mg and spironolactone  12.5 mg.  Will need BMP for follow-up on electrolytes and renal function. Orders:   Basic Metabolic Panel (BMET); Future

## 2024-11-02 NOTE — Assessment & Plan Note (Signed)
 Continue losartan  12.5 mg daily in the morning.  Continue follow-up with vascular department for lymphedema management.  Lower leg edema significantly better today.

## 2024-11-02 NOTE — Assessment & Plan Note (Signed)
 Losartan  25 mg and spironolactone  12.5 mg was started in the hospital.  Continue.  Refill sent.  Check BMP in 1 month, future lab ordered.

## 2024-11-02 NOTE — Assessment & Plan Note (Signed)
 Patient was found to have thickened endometrium on CT abdomen and pelvis during recent hospitalization.  Imaging results during recent thickened appearance of the endometrium. Postmenopausal: No menses since 2022.  Discussed potential role of excess estrogen due to obesity. Patient is already established with gynecologist Dr. Starla.  Recommend patient reach out to her office to schedule an appointment.  If she is not able to see a gynecologist recommend she reaches out to our clinic. Encouraged weight loss to reduce estrogen levels.

## 2024-11-04 ENCOUNTER — Telehealth: Payer: Self-pay | Admitting: *Deleted

## 2024-11-04 LAB — CULTURE, BLOOD (ROUTINE X 2)
Culture: NO GROWTH
Culture: NO GROWTH
Special Requests: ADEQUATE
Special Requests: ADEQUATE

## 2024-11-04 NOTE — Patient Outreach (Signed)
 Phone call to patient to compete follow up visit. Per daughter, patient not available for appointment. Daughter requested appointment re-scheduled. 11/18/24 at 1:30pm.   Lenn Mean, LCSW   [facility], [facility] Licensed Clinical Social Worker  Direct Dial: [phone]

## 2024-11-15 ENCOUNTER — Encounter

## 2024-11-16 ENCOUNTER — Ambulatory Visit (INDEPENDENT_AMBULATORY_CARE_PROVIDER_SITE_OTHER): Admitting: Nurse Practitioner

## 2024-11-16 ENCOUNTER — Encounter (INDEPENDENT_AMBULATORY_CARE_PROVIDER_SITE_OTHER): Payer: Self-pay | Admitting: Nurse Practitioner

## 2024-11-18 ENCOUNTER — Other Ambulatory Visit: Payer: Self-pay | Admitting: *Deleted

## 2024-12-06 ENCOUNTER — Other Ambulatory Visit

## 2025-01-10 ENCOUNTER — Ambulatory Visit

## 2025-02-23 ENCOUNTER — Ambulatory Visit: Admitting: Podiatry

## 2025-02-24 ENCOUNTER — Ambulatory Visit

## 2025-05-16 ENCOUNTER — Ambulatory Visit (INDEPENDENT_AMBULATORY_CARE_PROVIDER_SITE_OTHER): Admitting: Nurse Practitioner
